# Patient Record
Sex: Female | Born: 1980
Health system: Southern US, Community
[De-identification: ages and names within clinical notes are randomized; demographics above are authoritative.]

## PROBLEM LIST (undated history)

## (undated) DIAGNOSIS — L409 Psoriasis, unspecified: Secondary | ICD-10-CM

## (undated) DIAGNOSIS — N920 Excessive and frequent menstruation with regular cycle: Secondary | ICD-10-CM

## (undated) DIAGNOSIS — E785 Hyperlipidemia, unspecified: Secondary | ICD-10-CM

## (undated) DIAGNOSIS — K219 Gastro-esophageal reflux disease without esophagitis: Secondary | ICD-10-CM

## (undated) DIAGNOSIS — G4733 Obstructive sleep apnea (adult) (pediatric): Secondary | ICD-10-CM

## (undated) DIAGNOSIS — Z3009 Encounter for other general counseling and advice on contraception: Secondary | ICD-10-CM

## (undated) DIAGNOSIS — F32A Depression, unspecified: Secondary | ICD-10-CM

## (undated) DIAGNOSIS — M810 Age-related osteoporosis without current pathological fracture: Secondary | ICD-10-CM

## (undated) DIAGNOSIS — E7800 Pure hypercholesterolemia, unspecified: Secondary | ICD-10-CM

## (undated) DIAGNOSIS — G4730 Sleep apnea, unspecified: Secondary | ICD-10-CM

## (undated) DIAGNOSIS — I10 Essential (primary) hypertension: Secondary | ICD-10-CM

## (undated) DIAGNOSIS — G43909 Migraine, unspecified, not intractable, without status migrainosus: Secondary | ICD-10-CM

## (undated) DIAGNOSIS — M1990 Unspecified osteoarthritis, unspecified site: Secondary | ICD-10-CM

## (undated) DIAGNOSIS — R350 Frequency of micturition: Secondary | ICD-10-CM

## (undated) DIAGNOSIS — Z9989 Dependence on other enabling machines and devices: Secondary | ICD-10-CM

## (undated) DIAGNOSIS — N939 Abnormal uterine and vaginal bleeding, unspecified: Secondary | ICD-10-CM

## (undated) DIAGNOSIS — L4050 Arthropathic psoriasis, unspecified: Secondary | ICD-10-CM

## (undated) DIAGNOSIS — D509 Iron deficiency anemia, unspecified: Secondary | ICD-10-CM

## (undated) DIAGNOSIS — Z789 Other specified health status: Secondary | ICD-10-CM

## (undated) DIAGNOSIS — F419 Anxiety disorder, unspecified: Secondary | ICD-10-CM

## (undated) DIAGNOSIS — Z87898 Personal history of other specified conditions: Secondary | ICD-10-CM

## (undated) HISTORY — DX: Encounter for other general counseling and advice on contraception: Z30.09

## (undated) HISTORY — PX: NO PAST SURGERIES: SHX2092

## (undated) HISTORY — DX: Psoriasis, unspecified: L40.9

## (undated) HISTORY — PX: ABDOMINAL HYSTERECTOMY: SHX81

## (undated) HISTORY — DX: Age-related osteoporosis without current pathological fracture: M81.0

## (undated) HISTORY — DX: Gastro-esophageal reflux disease without esophagitis: K21.9

## (undated) HISTORY — DX: Sleep apnea, unspecified: G47.30

---
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
## 2008-03-21 ENCOUNTER — Emergency Department (HOSPITAL_COMMUNITY): Admission: EM | Admit: 2008-03-21 | Discharge: 2008-03-21 | Payer: Self-pay | Admitting: Emergency Medicine

## 2008-03-25 ENCOUNTER — Emergency Department (HOSPITAL_COMMUNITY): Admission: EM | Admit: 2008-03-25 | Discharge: 2008-03-25 | Payer: Self-pay | Admitting: Emergency Medicine

## 2008-03-31 ENCOUNTER — Emergency Department (HOSPITAL_COMMUNITY): Admission: EM | Admit: 2008-03-31 | Discharge: 2008-03-31 | Payer: Self-pay | Admitting: Emergency Medicine

## 2011-04-10 LAB — CBC
HCT: 39
Hemoglobin: 13.6
RBC: 4.31
WBC: 10.7 — ABNORMAL HIGH

## 2011-04-10 LAB — DIFFERENTIAL
Eosinophils Relative: 3
Lymphocytes Relative: 23
Lymphs Abs: 2.4
Monocytes Absolute: 1.3 — ABNORMAL HIGH
Monocytes Relative: 12

## 2011-04-10 LAB — URINE CULTURE: Culture: NO GROWTH

## 2011-04-10 LAB — URINALYSIS, ROUTINE W REFLEX MICROSCOPIC
Ketones, ur: NEGATIVE
Leukocytes, UA: NEGATIVE
Nitrite: POSITIVE — AB
pH: 5

## 2011-04-10 LAB — BASIC METABOLIC PANEL
Chloride: 105
GFR calc non Af Amer: >60
Potassium: 3.9
Sodium: 134 — ABNORMAL LOW

## 2011-04-10 LAB — URINE MICROSCOPIC-ADD ON

## 2011-04-10 LAB — PREGNANCY, URINE: Preg Test, Ur: NEGATIVE

## 2011-04-12 LAB — URINALYSIS, ROUTINE W REFLEX MICROSCOPIC
Glucose, UA: NEGATIVE
Specific Gravity, Urine: 1.02
Urobilinogen, UA: 0.2
pH: 6

## 2011-04-12 LAB — URINE MICROSCOPIC-ADD ON

## 2012-07-09 ENCOUNTER — Emergency Department (HOSPITAL_COMMUNITY)
Admission: EM | Admit: 2012-07-09 | Discharge: 2012-07-10 | Disposition: A | Discharge status: Home / Self Care | Payer: Self-pay | Attending: Emergency Medicine | Admitting: Emergency Medicine

## 2012-07-09 ENCOUNTER — Encounter (HOSPITAL_COMMUNITY): Payer: Self-pay | Admitting: *Deleted

## 2012-07-09 DIAGNOSIS — O09299 Supervision of pregnancy with other poor reproductive or obstetric history, unspecified trimester: Secondary | ICD-10-CM | POA: Insufficient documentation

## 2012-07-09 DIAGNOSIS — O219 Vomiting of pregnancy, unspecified: Secondary | ICD-10-CM | POA: Insufficient documentation

## 2012-07-09 DIAGNOSIS — F17200 Nicotine dependence, unspecified, uncomplicated: Secondary | ICD-10-CM | POA: Insufficient documentation

## 2012-07-09 DIAGNOSIS — O4690 Antepartum hemorrhage, unspecified, unspecified trimester: Secondary | ICD-10-CM

## 2012-07-09 DIAGNOSIS — O26859 Spotting complicating pregnancy, unspecified trimester: Secondary | ICD-10-CM | POA: Insufficient documentation

## 2012-07-09 LAB — COMPREHENSIVE METABOLIC PANEL
ALT: 18 U/L (ref 0–35)
Alkaline Phosphatase: 73 U/L (ref 39–117)
CO2: 23 mEq/L (ref 19–32)
Chloride: 101 mEq/L (ref 96–112)
GFR calc Af Amer: >90 mL/min (ref >90)
GFR calc non Af Amer: >90 mL/min (ref >90)
Glucose, Bld: 118 mg/dL — ABNORMAL HIGH (ref 70–99)
Potassium: 3.4 mEq/L — ABNORMAL LOW (ref 3.5–5.1)
Sodium: 133 mEq/L — ABNORMAL LOW (ref 135–145)

## 2012-07-09 LAB — CBC WITH DIFFERENTIAL/PLATELET
Eosinophils Relative: 1 % (ref 0–5)
Lymphocytes Relative: 19 % (ref 12–46)
Lymphs Abs: 3 10*3/uL (ref 0.7–4.0)
MCV: 85.6 fL (ref 78.0–100.0)
Neutrophils Relative %: 70 % (ref 43–77)
Platelets: 343 10*3/uL (ref 150–400)
RBC: 4.5 MIL/uL (ref 3.87–5.11)
WBC: 15.6 10*3/uL — ABNORMAL HIGH (ref 4.0–10.5)

## 2012-07-09 MED ORDER — ONDANSETRON 4 MG PO TBDP: 4.0000 mg | ORAL_TABLET | Freq: Three times a day (TID) | ORAL | Status: DC | PRN

## 2012-07-09 MED ORDER — ONDANSETRON 4 MG PO TBDP
4.0000 mg | ORAL_TABLET | Freq: Once | ORAL | Status: AC
  Administered 2012-07-09: 4 mg via ORAL
  Filled 2012-07-09: qty 1

## 2012-07-09 MED ORDER — RHO D IMMUNE GLOBULIN 1500 UNIT/2ML IJ SOLN
300.0000 ug | Freq: Once | INTRAMUSCULAR | Status: AC
  Administered 2012-07-10: 300 ug via INTRAVENOUS

## 2012-07-09 NOTE — ED Notes (Signed)
Pt reports thinking she is 8-[redacted] weeks pregnant.  But hasn't followed up with a physician, so isn't sure.  States that she began spotting a little a week ago.  States that at this time, she is having light brown discharge.

## 2012-07-09 NOTE — ED Notes (Signed)
Pt [redacted]wks pregnant, having spotting off and on for 1 wk, having daily nausea and emesis, pt is not following up with prenatal visits or taking prenatal vitamins

## 2012-07-09 NOTE — ED Provider Notes (Signed)
History  This chart was scribed for Theresa Muskrat, MD by Jenne Campus, ED Scribe. This patient was seen in room APA01/APA01 and the patient's care was started at 8:57 PM.  CSN: [id number]  Arrival date & time 07/09/12  2042   First MD Initiated Contact with Patient 07/09/12 2057      Chief Complaint  Patient presents with  . Vaginal Bleeding    spotting, [redacted] wks pregnant  . Emesis During Pregnancy    The history is provided by the patient. No language interpreter was used.   Theresa Sawyer is a 31 y.o. female, currently [redacted] weeks pregnant, who presents to the Emergency Department complaining of one week of intermittent vaginal bleeding described as spotting with associated mild abdominal pain, nausea and emesis up to 7 episodes daily for the past 1.5 weeks. She denies having vaginal bleeding currently. She is not taking her prenatal vitamins and she is not following up with her prenatal visits. She is G2P0A1 and reports that her first pregnancy ended in a miscarriage after she had similar symptoms. She also reports that she is having chills and congestion but denies diarrhea, CP and SOB as associated symptoms. She does not have a h/o chronic medical conditions and denies having a h/o abdominal surgeries. She is a current everyday smoker but denies alcohol use.   She is RH negative.  History reviewed. No pertinent past medical history.  History reviewed. No pertinent past surgical history.  History reviewed. No pertinent family history.  History  Substance Use Topics  . Smoking status: Current Every Day Smoker -- 0.5 packs/day    Types: Cigarettes  . Smokeless tobacco: Not on file  . Alcohol Use: No    OB History    Grav Para Term Preterm Abortions TAB SAB Ect Mult Living   2 0   1           Review of Systems  Constitutional:       Per HPI, otherwise negative  HENT:       Per HPI, otherwise negative  Eyes: Negative.   Respiratory:       Per HPI, otherwise negative    Cardiovascular:       Per HPI, otherwise negative  Gastrointestinal: Positive for nausea, vomiting and abdominal pain. Negative for diarrhea.  Genitourinary: Positive for vaginal bleeding. Negative for vaginal pain.  Musculoskeletal:       Per HPI, otherwise negative  Skin: Negative.   Neurological: Negative for syncope.    Allergies  Review of patient's allergies indicates no known allergies.  Home Medications  No current outpatient prescriptions on file.  Triage Vitals: BP 131/84  Pulse 102  Temp 98 F (36.7 C) (Oral)  Resp 18  Ht 5' 5"  (1.651 m)  Wt 200 lb (90.719 kg)  BMI 33.28 kg/m2  SpO2 100%  Physical Exam  Nursing note and vitals reviewed. Constitutional: She is oriented to person, place, and time. She appears well-developed and well-nourished. No distress.  HENT:  Head: Normocephalic and atraumatic.  Eyes: Conjunctivae normal and EOM are normal.  Cardiovascular: Normal rate and regular rhythm.   Pulmonary/Chest: Effort normal and breath sounds normal. No stridor. No respiratory distress.  Abdominal: Soft. Bowel sounds are normal. She exhibits no distension. There is no tenderness.  Musculoskeletal: She exhibits no edema.  Neurological: She is alert and oriented to person, place, and time. No cranial nerve deficit.  Skin: Skin is warm and dry.  Psychiatric: She has a normal mood and  affect.    ED Course  [place] bedside Date/Time: 07/09/2012 8:00 PM Performed by: Theresa Sawyer Authorized by: Theresa Sawyer Consent: Verbal consent obtained. The procedure was performed in an emergent situation. Risks and benefits: risks, benefits and alternatives were discussed Consent given by: patient Patient understanding: patient states understanding of the procedure being performed Patient consent: the patient's understanding of the procedure matches consent given Patient identity confirmed: verbally with patient Time out: Immediately prior to procedure a "time out"  was called to verify the correct patient, procedure, equipment, support staff and site/side marked as required. Preparation: Patient was prepped and draped in the usual sterile fashion. Local anesthesia used: no Patient sedated: no Patient tolerance: Patient tolerated the procedure well with no immediate complications. Comments: IUP w FHT ~160 - good fetal motion, no gross intraperitoneal fluid   (including critical care time)  DIAGNOSTIC STUDIES: Oxygen Saturation is 100% on room air, normal by my interpretation.    COORDINATION OF CARE: 9:11 PM- Discussed treatment plan which includes [place] with pt at bedside and pt agreed to plan.  9:13 PM- Through [place], embryo is visualized in the correct place and moving appropriately.   10:38 PM- Advised pt of lab work results. Discussed discharge plan which includes antiemtic with pt and pt agreed to plan. Also advised pt to follow up with OB-GYN and pt agreed.  10:45 PM-Ordered 300 mcg rho(d) immune globulin injection  Labs Reviewed  CBC WITH DIFFERENTIAL - Abnormal; Notable for the following:    WBC 15.6 (*)     Neutro Abs 10.8 (*)     Monocytes Absolute 1.6 (*)     All other components within normal limits  COMPREHENSIVE METABOLIC PANEL - Abnormal; Notable for the following:    Sodium 133 (*)     Potassium 3.4 (*)     Glucose, Bld 118 (*)     All other components within normal limits  RH IG WORKUP (INCLUDES ABO/RH)  LIPASE, BLOOD   No results found.   No diagnosis found.    MDM   I personally performed the services described in this documentation, which was scribed in my presence. The recorded information has been reviewed and is accurate.   This patient, early in pregnancy presents with concerns of vaginal spotting, nausea, vomiting, abdominal discomfort.  On exam vital signs are stable and she is in no distress.  The patient one prior pregnancy, spontaneously terminated.  But the ultrasound demonstrates an IUP appropriate heart  tones.  The patient's labs are unremarkable, with road negative status.  The patient was provided appropriate immunization, discharged in stable condition after discussion on the need for close outpatient monitoring and management of her pregnancy.   Theresa Muskrat, MD 07/09/12 [phone]

## 2012-07-10 NOTE — L&D Delivery Note (Signed)
Delivery Note At 4:45 PM a viable female was delivered via Vaginal, Spontaneous Delivery (Presentation: ; left Occiput Anterior).  APGAR: 8, 9; weight TBD .   Placenta status: Intact, Spontaneous.  Cord: 3 vessel  with the following complications: see below .    Anesthesia: Epidural  Episiotomy: None Lacerations: 2nd degree;Perineal Suture Repair: 3.0 vicryl rapide Est. Blood Loss (mL): 400  Mom to postpartum.  Baby to nursery-stable.  Bradden Tadros L 02/04/2013, 5:28 PM

## 2012-07-10 NOTE — L&D Delivery Note (Signed)
Attestation of Attending Supervision of Advanced Practitioner (CNM/NP): Evaluation and management procedures were performed by the Advanced Practitioner under my supervision and collaboration.  I have reviewed the Advanced Practitioner's note and chart, and I agree with the management and plan.  Sophya Vanblarcom 02/10/2013 1:21 PM

## 2012-07-11 LAB — RH IG WORKUP (INCLUDES ABO/RH)
Gestational Age(Wks): 8
Unit division: 0

## 2012-07-14 ENCOUNTER — Emergency Department (HOSPITAL_COMMUNITY)
Admission: EM | Admit: 2012-07-14 | Discharge: 2012-07-14 | Disposition: A | Discharge status: Home / Self Care | Payer: Self-pay | Attending: Emergency Medicine | Admitting: Emergency Medicine

## 2012-07-14 ENCOUNTER — Encounter (HOSPITAL_COMMUNITY): Payer: Self-pay | Admitting: *Deleted

## 2012-07-14 DIAGNOSIS — F17200 Nicotine dependence, unspecified, uncomplicated: Secondary | ICD-10-CM | POA: Insufficient documentation

## 2012-07-14 DIAGNOSIS — R05 Cough: Secondary | ICD-10-CM | POA: Insufficient documentation

## 2012-07-14 DIAGNOSIS — R112 Nausea with vomiting, unspecified: Secondary | ICD-10-CM | POA: Insufficient documentation

## 2012-07-14 DIAGNOSIS — R059 Cough, unspecified: Secondary | ICD-10-CM | POA: Insufficient documentation

## 2012-07-14 DIAGNOSIS — O9989 Other specified diseases and conditions complicating pregnancy, childbirth and the puerperium: Secondary | ICD-10-CM | POA: Insufficient documentation

## 2012-07-14 DIAGNOSIS — J1000 Influenza due to other identified influenza virus with unspecified type of pneumonia: Secondary | ICD-10-CM | POA: Insufficient documentation

## 2012-07-14 DIAGNOSIS — H9209 Otalgia, unspecified ear: Secondary | ICD-10-CM | POA: Insufficient documentation

## 2012-07-14 DIAGNOSIS — J101 Influenza due to other identified influenza virus with other respiratory manifestations: Secondary | ICD-10-CM

## 2012-07-14 LAB — COMPREHENSIVE METABOLIC PANEL
ALT: 21 U/L (ref 0–35)
Calcium: 9.5 mg/dL (ref 8.4–10.5)
Creatinine, Ser: 0.61 mg/dL (ref 0.50–1.10)
GFR calc Af Amer: >90 mL/min (ref >90)
Glucose, Bld: 101 mg/dL — ABNORMAL HIGH (ref 70–99)
Sodium: 132 mEq/L — ABNORMAL LOW (ref 135–145)
Total Protein: 7.8 g/dL (ref 6.0–8.3)

## 2012-07-14 LAB — CBC WITH DIFFERENTIAL/PLATELET
Basophils Absolute: 0 10*3/uL (ref 0.0–0.1)
Eosinophils Absolute: 0 10*3/uL (ref 0.0–0.7)
Eosinophils Relative: 0 % (ref 0–5)
Lymphs Abs: 1.3 10*3/uL (ref 0.7–4.0)
MCH: 30 pg (ref 26.0–34.0)
MCV: 84.6 fL (ref 78.0–100.0)
Monocytes Absolute: 1.2 10*3/uL — ABNORMAL HIGH (ref 0.1–1.0)
Platelets: 247 10*3/uL (ref 150–400)
RDW: 13.1 % (ref 11.5–15.5)

## 2012-07-14 LAB — URINALYSIS, ROUTINE W REFLEX MICROSCOPIC
Ketones, ur: >80 mg/dL — AB
Leukocytes, UA: NEGATIVE
Nitrite: NEGATIVE
Protein, ur: 30 mg/dL — AB
Urobilinogen, UA: 4 mg/dL — ABNORMAL HIGH (ref 0.0–1.0)
pH: 6 (ref 5.0–8.0)

## 2012-07-14 LAB — INFLUENZA PANEL BY PCR (TYPE A & B)
H1N1 flu by pcr: DETECTED — AB
Influenza B By PCR: NEGATIVE

## 2012-07-14 MED ORDER — OSELTAMIVIR PHOSPHATE 75 MG PO CAPS
75.0000 mg | ORAL_CAPSULE | Freq: Once | ORAL | Status: AC
  Administered 2012-07-14: 75 mg via ORAL
  Filled 2012-07-14: qty 1

## 2012-07-14 MED ORDER — OSELTAMIVIR PHOSPHATE 75 MG PO CAPS: 75.0000 mg | ORAL_CAPSULE | Freq: Two times a day (BID) | ORAL | Status: DC

## 2012-07-14 MED ORDER — ONDANSETRON 8 MG/NS 50 ML IVPB
8.0000 mg | Freq: Once | INTRAVENOUS | Status: DC
  Filled 2012-07-14: qty 8

## 2012-07-14 MED ORDER — ONDANSETRON HCL 4 MG PO TABS: 4.0000 mg | ORAL_TABLET | Freq: Four times a day (QID) | ORAL | Status: DC

## 2012-07-14 MED ORDER — ONDANSETRON HCL 4 MG/2ML IJ SOLN
INTRAMUSCULAR | Status: AC
  Administered 2012-07-14: 8 mg via INTRAVENOUS
  Filled 2012-07-14: qty 4

## 2012-07-14 MED ORDER — SODIUM CHLORIDE 0.9 % IV BOLUS (SEPSIS)
1000.0000 mL | Freq: Once | INTRAVENOUS | Status: AC
  Administered 2012-07-14: 1000 mL via INTRAVENOUS

## 2012-07-14 NOTE — ED Provider Notes (Signed)
History     CSN: [id number]  Arrival date & time 07/14/12  1736   First MD Initiated Contact with Patient 07/14/12 1743      Chief Complaint  Patient presents with  . Emesis During Pregnancy    (Consider location/radiation/quality/duration/timing/severity/associated sxs/prior treatment) HPI Comments: Comes to the ER for evaluation of nausea and vomiting. Patient reports that she is [redacted] weeks pregnant. She has been having severe nausea and vomiting for the last 4 days. Patient says she cannot have anything down. She also has had left earache she had cough. The symptoms have been present for a couple of days as well. Patient has not had any diarrhea. She has not taken her temperature.   History reviewed. No pertinent past medical history.  History reviewed. No pertinent past surgical history.  No family history on file.  History  Substance Use Topics  . Smoking status: Current Every Day Smoker -- 0.5 packs/day    Types: Cigarettes  . Smokeless tobacco: Not on file  . Alcohol Use: No    OB History    Grav Para Term Preterm Abortions TAB SAB Ect Mult Living   1               Review of Systems  HENT: Positive for ear pain.   Respiratory: Positive for cough.   Gastrointestinal: Positive for nausea and vomiting.  All other systems reviewed and are negative.    Allergies  Review of patient's allergies indicates no known allergies.  Home Medications   Current Outpatient Rx  Name  Route  Sig  Dispense  Refill  . ONDANSETRON 4 MG PO TBDP   Oral   Take 1 tablet (4 mg total) by mouth every 8 (eight) hours as needed for nausea.   20 tablet   0     BP 124/109  Pulse 124  Temp 98.1 F (36.7 C) (Oral)  Resp 20  Ht 5' 5"  (1.651 m)  Wt 200 lb (90.719 kg)  BMI 33.28 kg/m2  SpO2 98%  Physical Exam  Constitutional: She is oriented to person, place, and time. She appears well-developed and well-nourished. No distress.  HENT:  Head: Normocephalic and atraumatic.    Right Ear: Hearing, tympanic membrane, external ear and ear canal normal.  Left Ear: Tympanic membrane, external ear and ear canal normal.  Nose: Nose normal.  Mouth/Throat: Oropharynx is clear and moist and mucous membranes are normal.  Eyes: Conjunctivae normal and EOM are normal. Pupils are equal, round, and reactive to light.  Neck: Normal range of motion. Neck supple.  Cardiovascular: Normal rate, regular rhythm, S1 normal and S2 normal.  Exam reveals no gallop and no friction rub.   No murmur heard. Pulmonary/Chest: Effort normal and breath sounds normal. No respiratory distress. She exhibits no tenderness.  Abdominal: Soft. Normal appearance and bowel sounds are normal. There is no hepatosplenomegaly. There is no tenderness. There is no rebound, no guarding, no tenderness at McBurney's point and negative Murphy's sign. No hernia.  Musculoskeletal: Normal range of motion.  Neurological: She is alert and oriented to person, place, and time. She has normal strength. No cranial nerve deficit or sensory deficit. Coordination normal. GCS eye subscore is 4. GCS verbal subscore is 5. GCS motor subscore is 6.  Skin: Skin is warm, dry and intact. No rash noted. No cyanosis.  Psychiatric: She has a normal mood and affect. Her speech is normal and behavior is normal. Thought content normal.    ED Course  Procedures (  including critical care time)  Labs Reviewed  CBC WITH DIFFERENTIAL - Abnormal; Notable for the following:    Monocytes Relative 15 (*)     Monocytes Absolute 1.2 (*)     All other components within normal limits  COMPREHENSIVE METABOLIC PANEL - Abnormal; Notable for the following:    Sodium 132 (*)     Potassium 3.2 (*)     Glucose, Bld 101 (*)     All other components within normal limits  INFLUENZA PANEL BY PCR - Abnormal; Notable for the following:    Influenza A By PCR POSITIVE (*)     H1N1 flu by pcr DETECTED (*)     All other components within normal limits   URINALYSIS, ROUTINE W REFLEX MICROSCOPIC - Abnormal; Notable for the following:    Specific Gravity, Urine >1.030 (*)     Bilirubin Urine MODERATE (*)     Ketones, ur >80 (*)     Protein, ur 30 (*)     Urobilinogen, UA 4.0 (*)     All other components within normal limits  URINE MICROSCOPIC-ADD ON   No results found.   1. Influenza A (H1N1)       MDM  Patient presents to ER for evaluation of nausea and vomiting. Patient is approximately [redacted] weeks pregnant. Records reveals that she a previous visit with ultrasound showed intrauterine pregnancy. It was felt that the vomiting was likely secondary to early pregnancy, however she also reports having some cough and chest congestion. And influenza PCR panel is therefore sent and was positive for H1 N1 influenza A. Patient initiated on Tamiflu.        Orpah Greek, MD 07/14/12 2017

## 2012-07-14 NOTE — ED Notes (Signed)
Seen here on 07/09/12 for same - reports is not any better.  Pt approx [redacted] wks pregnant with vomiting and dizziness and near syncope.

## 2012-07-14 NOTE — ED Notes (Signed)
Up to bathroom, tolerated well. Asking for something to drink, cola given per pt request

## 2012-07-14 NOTE — ED Notes (Signed)
Pt unable to void at this time. 

## 2012-07-14 NOTE — ED Notes (Signed)
Has tolerated full can of cola and several packs of saltines. No nausea

## 2012-12-04 ENCOUNTER — Other Ambulatory Visit: Payer: Self-pay | Admitting: Obstetrics & Gynecology

## 2012-12-04 DIAGNOSIS — O0933 Supervision of pregnancy with insufficient antenatal care, third trimester: Secondary | ICD-10-CM

## 2012-12-10 ENCOUNTER — Other Ambulatory Visit: Payer: Self-pay | Admitting: Obstetrics & Gynecology

## 2012-12-10 ENCOUNTER — Ambulatory Visit (INDEPENDENT_AMBULATORY_CARE_PROVIDER_SITE_OTHER): Payer: Medicaid Other

## 2012-12-10 DIAGNOSIS — O0930 Supervision of pregnancy with insufficient antenatal care, unspecified trimester: Secondary | ICD-10-CM

## 2012-12-10 DIAGNOSIS — O0933 Supervision of pregnancy with insufficient antenatal care, third trimester: Secondary | ICD-10-CM

## 2012-12-10 DIAGNOSIS — O09299 Supervision of pregnancy with other poor reproductive or obstetric history, unspecified trimester: Secondary | ICD-10-CM

## 2012-12-10 NOTE — Progress Notes (Signed)
U/s performed ,anat screen complete late gest. Age. Meas. C/w 32 wks gest. edc 02-03-13, female fetus, vertex lie, ant. Plac, gr 2,

## 2012-12-16 ENCOUNTER — Ambulatory Visit (INDEPENDENT_AMBULATORY_CARE_PROVIDER_SITE_OTHER): Payer: Medicaid Other | Admitting: Women's Health

## 2012-12-16 ENCOUNTER — Other Ambulatory Visit: Payer: Medicaid Other

## 2012-12-16 ENCOUNTER — Other Ambulatory Visit (HOSPITAL_COMMUNITY)
Admission: RE | Admit: 2012-12-16 | Discharge: 2012-12-16 | Disposition: A | Discharge status: Home / Self Care | Payer: Medicaid Other | Source: Ambulatory Visit | Attending: Obstetrics and Gynecology | Admitting: Obstetrics and Gynecology

## 2012-12-16 ENCOUNTER — Encounter: Payer: Self-pay | Admitting: Women's Health

## 2012-12-16 VITALS — BP 124/80 | Wt 192.5 lb

## 2012-12-16 DIAGNOSIS — Z01419 Encounter for gynecological examination (general) (routine) without abnormal findings: Secondary | ICD-10-CM | POA: Insufficient documentation

## 2012-12-16 DIAGNOSIS — O99330 Smoking (tobacco) complicating pregnancy, unspecified trimester: Secondary | ICD-10-CM

## 2012-12-16 DIAGNOSIS — Z331 Pregnant state, incidental: Secondary | ICD-10-CM

## 2012-12-16 DIAGNOSIS — O0993 Supervision of high risk pregnancy, unspecified, third trimester: Secondary | ICD-10-CM

## 2012-12-16 DIAGNOSIS — Z3480 Encounter for supervision of other normal pregnancy, unspecified trimester: Secondary | ICD-10-CM | POA: Insufficient documentation

## 2012-12-16 DIAGNOSIS — Z113 Encounter for screening for infections with a predominantly sexual mode of transmission: Secondary | ICD-10-CM | POA: Insufficient documentation

## 2012-12-16 DIAGNOSIS — O09899 Supervision of other high risk pregnancies, unspecified trimester: Secondary | ICD-10-CM

## 2012-12-16 DIAGNOSIS — O0933 Supervision of pregnancy with insufficient antenatal care, third trimester: Secondary | ICD-10-CM | POA: Insufficient documentation

## 2012-12-16 DIAGNOSIS — Z1389 Encounter for screening for other disorder: Secondary | ICD-10-CM

## 2012-12-16 DIAGNOSIS — O0930 Supervision of pregnancy with insufficient antenatal care, unspecified trimester: Secondary | ICD-10-CM

## 2012-12-16 DIAGNOSIS — O09299 Supervision of pregnancy with other poor reproductive or obstetric history, unspecified trimester: Secondary | ICD-10-CM

## 2012-12-16 DIAGNOSIS — Z3483 Encounter for supervision of other normal pregnancy, third trimester: Secondary | ICD-10-CM

## 2012-12-16 DIAGNOSIS — Z1151 Encounter for screening for human papillomavirus (HPV): Secondary | ICD-10-CM | POA: Insufficient documentation

## 2012-12-16 LAB — POCT URINALYSIS DIPSTICK
Blood, UA: NEGATIVE
Glucose, UA: NEGATIVE
Nitrite, UA: NEGATIVE

## 2012-12-16 LAB — URINALYSIS
Bilirubin Urine: NEGATIVE
Ketones, ur: NEGATIVE mg/dL
Nitrite: NEGATIVE
Protein, ur: NEGATIVE mg/dL
Specific Gravity, Urine: 1.017 (ref 1.005–1.030)
Urobilinogen, UA: 0.2 mg/dL (ref 0.0–1.0)

## 2012-12-16 NOTE — Addendum Note (Signed)
Addended by: Linton Rump on: 12/16/2012 10:09 AM   Modules accepted: Orders

## 2012-12-16 NOTE — Patient Instructions (Addendum)
You will have your sugar test next visit.  Please do not eat or drink anything after midnight the night before you come, not even water.  You will be here for at least two hours.    Pregnancy - Third Trimester The third trimester of pregnancy (the last 3 months) is a period of the most rapid growth for you and your baby. The baby approaches a length of 20 inches and a weight of 6 to 10 pounds. The baby is adding on fat and getting ready for life outside your body. While inside, babies have periods of sleeping and waking, sucking thumbs, and hiccuping. You can often feel small contractions of the uterus. This is false labor. It is also called Braxton-Hicks contractions. This is like a practice for labor. The usual problems in this stage of pregnancy include more difficulty breathing, swelling of the hands and feet from water retention, and having to urinate more often because of the uterus and baby pressing on your bladder.  PRENATAL EXAMS  Blood work may continue to be done during prenatal exams. These tests are done to check on your health and the probable health of your baby. Blood work is used to follow your blood levels (hemoglobin). Anemia (low hemoglobin) is common during pregnancy. Iron and vitamins are given to help prevent this. You may also continue to be checked for diabetes. Some of the past blood tests may be done again.  The size of the uterus is measured during each visit. This makes sure your baby is growing properly according to your pregnancy dates.  Your blood pressure is checked every prenatal visit. This is to make sure you are not getting toxemia.  Your urine is checked every prenatal visit for infection, diabetes, and protein.  Your weight is checked at each visit. This is done to make sure gains are happening at the suggested rate and that you and your baby are growing normally.  Sometimes, an ultrasound is performed to confirm the position and the proper growth and  development of the baby. This is a test done that bounces harmless sound waves off the baby so your caregiver can more accurately determine a due date.  Discuss the type of pain medicine and anesthesia you will have during your labor and delivery.  Discuss the possibility and anesthesia if a cesarean section might be necessary.  Inform your caregiver if there is any mental or physical violence at home. Sometimes, a specialized non-stress test, contraction stress test, and biophysical profile are done to make sure the baby is not having a problem. Checking the amniotic fluid surrounding the baby is called an amniocentesis. The amniotic fluid is removed by sticking a needle into the belly (abdomen). This is sometimes done near the end of pregnancy if an early delivery is required. In this case, it is done to help make sure the baby's lungs are mature enough for the baby to live outside of the womb. If the lungs are not mature and it is unsafe to deliver the baby, an injection of cortisone medicine is given to the mother 1 to 2 days before the delivery. This helps the baby's lungs mature and makes it safer to deliver the baby. CHANGES OCCURING IN THE THIRD TRIMESTER OF PREGNANCY Your body goes through many changes during pregnancy. They vary from person to person. Talk to your caregiver about changes you notice and are concerned about.  During the last trimester, you have probably had an increase in your appetite. It  is normal to have cravings for certain foods. This varies from person to person and pregnancy to pregnancy.  You may begin to get stretch marks on your hips, abdomen, and breasts. These are normal changes in the body during pregnancy. There are no exercises or medicines to take which prevent this change.  Constipation may be treated with a stool softener or adding bulk to your diet. Drinking lots of fluids, fiber in vegetables, fruits, and whole grains are helpful.  Exercising is also  helpful. If you have been very active up until your pregnancy, most of these activities can be continued during your pregnancy. If you have been less active, it is helpful to start an exercise program such as walking. Consult your caregiver before starting exercise programs.  Avoid all smoking, alcohol, non-prescribed drugs, herbs and "street drugs" during your pregnancy. These chemicals affect the formation and growth of the baby. Avoid chemicals throughout the pregnancy to ensure the delivery of a healthy infant.  Backache, varicose veins, and hemorrhoids may develop or get worse.  You will tire more easily in the third trimester, which is normal.  The baby's movements may be stronger and more often.  You may become short of breath easily.  Your belly button may stick out.  A yellow discharge may leak from your breasts called colostrum.  You may have a bloody mucus discharge. This usually occurs a few days to a week before labor begins. HOME CARE INSTRUCTIONS   Keep your caregiver's appointments. Follow your caregiver's instructions regarding medicine use, exercise, and diet.  During pregnancy, you are providing food for you and your baby. Continue to eat regular, well-balanced meals. Choose foods such as meat, fish, milk and other low fat dairy products, vegetables, fruits, and whole-grain breads and cereals. Your caregiver will tell you of the ideal weight gain.  A physical sexual relationship may be continued throughout pregnancy if there are no other problems such as early (premature) leaking of amniotic fluid from the membranes, vaginal bleeding, or belly (abdominal) pain.  Exercise regularly if there are no restrictions. Check with your caregiver if you are unsure of the safety of your exercises. Greater weight gain will occur in the last 2 trimesters of pregnancy. Exercising helps:  Control your weight.  Get you in shape for labor and delivery.  You lose weight after you  deliver.  Rest a lot with legs elevated, or as needed for leg cramps or low back pain.  Wear a good support or jogging bra for breast tenderness during pregnancy. This may help if worn during sleep. Pads or tissues may be used in the bra if you are leaking colostrum.  Do not use hot tubs, steam rooms, or saunas.  Wear your seat belt when driving. This protects you and your baby if you are in an accident.  Avoid raw meat, cat litter boxes and soil used by cats. These carry germs that can cause birth defects in the baby.  It is easier to leak urine during pregnancy. Tightening up and strengthening the pelvic muscles will help with this problem. You can practice stopping your urination while you are going to the bathroom. These are the same muscles you need to strengthen. It is also the muscles you would use if you were trying to stop from passing gas. You can practice tightening these muscles up 10 times a set and repeating this about 3 times per day. Once you know what muscles to tighten up, do not perform these exercises  during urination. It is more likely to cause an infection by backing up the urine.  Ask for help if you have financial, counseling, or nutritional needs during pregnancy. Your caregiver will be able to offer counseling for these needs as well as refer you for other special needs.  Make a list of emergency phone numbers and have them available.  Plan on getting help from family or friends when you go home from the hospital.  Make a trial run to the hospital.  Take prenatal classes with the father to understand, practice, and ask questions about the labor and delivery.  Prepare the baby's room or nursery.  Do not travel out of the city unless it is absolutely necessary and with the advice of your caregiver.  Wear only low or no heal shoes to have better balance and prevent falling. MEDICINES AND DRUG USE IN PREGNANCY  Take prenatal vitamins as directed. The vitamin  should contain 1 milligram of folic acid. Keep all vitamins out of reach of children. Only a couple vitamins or tablets containing iron may be fatal to a baby or young child when ingested.  Avoid use of all medicines, including herbs, over-the-counter medicines, not prescribed or suggested by your caregiver. Only take over-the-counter or prescription medicines for pain, discomfort, or fever as directed by your caregiver. Do not use aspirin, ibuprofen or naproxen unless approved by your caregiver.  Let your caregiver also know about herbs you may be using.  Alcohol is related to a number of birth defects. This includes fetal alcohol syndrome. All alcohol, in any form, should be avoided completely. Smoking will cause low birth rate and premature babies.  Illegal drugs are very harmful to the baby. They are absolutely forbidden. A baby born to an addicted mother will be addicted at birth. The baby will go through the same withdrawal an adult does. SEEK MEDICAL CARE IF: You have any concerns or worries during your pregnancy. It is better to call with your questions if you feel they cannot wait, rather than worry about them. SEEK IMMEDIATE MEDICAL CARE IF:   An unexplained oral temperature above 102 F (38.9 C) develops, or as your caregiver suggests.  You have leaking of fluid from the vagina. If leaking membranes are suspected, take your temperature and tell your caregiver of this when you call.  There is vaginal spotting, bleeding or passing clots. Tell your caregiver of the amount and how many pads are used.  You develop a bad smelling vaginal discharge with a change in the color from clear to white.  You develop vomiting that lasts more than 24 hours.  You develop chills or fever.  You develop shortness of breath.  You develop burning on urination.  You loose more than 2 pounds of weight or gain more than 2 pounds of weight or as suggested by your caregiver.  You notice sudden  swelling of your face, hands, and feet or legs.  You develop belly (abdominal) pain. Round ligament discomfort is a common non-cancerous (benign) cause of abdominal pain in pregnancy. Your caregiver still must evaluate you.  You develop a severe headache that does not go away.  You develop visual problems, blurred or double vision.  If you have not felt your baby move for more than 1 hour. If you think the baby is not moving as much as usual, eat something with sugar in it and lie down on your left side for an hour. The baby should move at least 4 to  5 times per hour. Call right away if your baby moves less than that.  You fall, are in a car accident, or any kind of trauma.  There is mental or physical violence at home. Document Released: 06/20/2001 Document Revised: 03/20/2012 Document Reviewed: 12/23/2008 [facility] Patient Information 2014 [place].

## 2012-12-16 NOTE — Progress Notes (Signed)
New OB packet given. Consents signed.

## 2012-12-16 NOTE — Progress Notes (Addendum)
  Subjective:    Theresa Sawyer is a 32 y.o. G55P0020 Caucasian female at 52w0dby 32.1wk u/s, being seen today for her first obstetrical visit.  Her obstetrical history is significant for obesity, smoker and late onset care @ 32.1wks.  Pregnancy history fully reviewed. States she was initially contemplating EAB, had a very bad case of the flu, then had a virus after that, and then it was too late.  Smokes 1pack q 3-4 days. Thinking about breast/bottlefeeding.  Desires BTL, r/b fully discussed, pt wishes to proceed. Patient reports no bleeding, no contractions and no leaking. Reports good fm. Denies urinary frequency, hesitancy, urgency, or dysuria.  Filed Vitals:   12/16/12 0904  BP: 124/80  Weight: 192 lb 8 oz (87.317 kg)    HISTORY: OB History   Grav Para Term Preterm Abortions TAB SAB Ect Mult Living   3 0 0 0 2 1 1 0 0 0      # Outc Date GA Lbr Len/2nd Wgt Sex Del Anes PTL Lv   1 TAB         No   2 SAB         No   3 CUR              Past Medical History  Diagnosis Date  . Medical history non-contributory    Past Surgical History  Procedure Laterality Date  . No past surgeries     Family History  Problem Relation Age of Onset  . Diabetes Maternal Grandmother   . Diabetes Paternal Grandmother      Exam    Pelvic Exam:    Perineum: Normal Perineum   Vulva: normal   Vagina:  normal mucosa, normal discharge, no palpable nodules   Uterus   FH 32cm     Cervix: normal   Adnexa: Not palpable   Urinary: urethral meatus normal    System:     Skin: normal coloration and turgor, no rashes    Neurologic: oriented, normal mood   Extremities: normal strength, tone, and muscle mass   HEENT PERRLA   Mouth/Teeth mucous membranes moist   Cardiovascular: regular rate and rhythm   Respiratory:  appears well, vitals normal, no respiratory distress, acyanotic, normal RR   Abdomen: soft, non-tender    Thin prep pap smear obatined w/ high risk HPV screening  FHR: 144 via  doppler   Assessment:    Pregnancy: [id number] Active Problem List   Diagnosis Date Noted  . Supervision of other normal pregnancy 12/16/2012    Priority: High  . Late prenatal care complicating pregnancy in third trimester 12/16/2012    Priority: High      [id number]  New OB visit Smoker Pregravid BMI 35.8 Late onset care @ 32.1wks (office u/s) Desires permanent sterilization pp   Plan:   Continue prenatal vitamins Problem list reviewed and updated BTL consent signed today UA, C&S, UDS today Reviewed ptl s/s, fetal kick counts, and s/s to report Genetic Screening discussed : too late Cystic fibrosis screening discussed requested Ultrasound discussed; fetal survey: results reviewed Follow up asap (pt states earliest is Friday) for 2hr gtt- will get PN1, CF, and HSV2 at the same time, then in 2 weeks for visit  BoTawnya Crook/03/2013 9:38 AM

## 2012-12-17 LAB — DRUG SCREEN, URINE, NO CONFIRMATION
Amphetamine Screen, Ur: NEGATIVE
Barbiturate Quant, Ur: NEGATIVE
Cocaine Metabolites: NEGATIVE
Methadone: NEGATIVE
Opiate Screen, Urine: NEGATIVE

## 2012-12-18 LAB — US OB DETAIL + 14 WK

## 2012-12-18 LAB — URINE CULTURE: Organism ID, Bacteria: NO GROWTH

## 2012-12-20 ENCOUNTER — Other Ambulatory Visit: Payer: Medicaid Other

## 2012-12-20 DIAGNOSIS — Z3480 Encounter for supervision of other normal pregnancy, unspecified trimester: Secondary | ICD-10-CM

## 2012-12-20 LAB — CBC
HCT: 30.9 % — ABNORMAL LOW (ref 36.0–46.0)
Hemoglobin: 11 g/dL — ABNORMAL LOW (ref 12.0–15.0)
MCH: 30.6 pg (ref 26.0–34.0)
MCHC: 35.6 g/dL (ref 30.0–36.0)
MCV: 86.1 fL (ref 78.0–100.0)
RBC: 3.59 MIL/uL — ABNORMAL LOW (ref 3.87–5.11)

## 2012-12-21 ENCOUNTER — Encounter: Payer: Self-pay | Admitting: Women's Health

## 2012-12-21 DIAGNOSIS — O26899 Other specified pregnancy related conditions, unspecified trimester: Secondary | ICD-10-CM

## 2012-12-21 DIAGNOSIS — Z6791 Unspecified blood type, Rh negative: Secondary | ICD-10-CM | POA: Insufficient documentation

## 2012-12-21 LAB — ABO AND RH: Rh Type: NEGATIVE

## 2012-12-21 LAB — SICKLE CELL SCREEN: Sickle Cell Screen: NEGATIVE

## 2012-12-21 LAB — GLUCOSE TOLERANCE, 2 HOURS W/ 1HR: Glucose, 1 hour: 177 mg/dL — ABNORMAL HIGH (ref 70–170)

## 2012-12-21 LAB — HEPATITIS B SURFACE ANTIGEN: Hepatitis B Surface Ag: NEGATIVE

## 2012-12-23 ENCOUNTER — Encounter: Payer: Self-pay | Admitting: Women's Health

## 2012-12-24 LAB — CYSTIC FIBROSIS DIAGNOSTIC STUDY

## 2012-12-28 ENCOUNTER — Encounter: Payer: Self-pay | Admitting: Women's Health

## 2012-12-30 ENCOUNTER — Encounter: Payer: Self-pay | Admitting: Women's Health

## 2012-12-30 ENCOUNTER — Ambulatory Visit (INDEPENDENT_AMBULATORY_CARE_PROVIDER_SITE_OTHER): Payer: Medicaid Other | Admitting: Women's Health

## 2012-12-30 VITALS — BP 122/72 | Wt 194.0 lb

## 2012-12-30 DIAGNOSIS — B373 Candidiasis of vulva and vagina: Secondary | ICD-10-CM

## 2012-12-30 DIAGNOSIS — Z331 Pregnant state, incidental: Secondary | ICD-10-CM

## 2012-12-30 DIAGNOSIS — O360990 Maternal care for other rhesus isoimmunization, unspecified trimester, not applicable or unspecified: Secondary | ICD-10-CM

## 2012-12-30 DIAGNOSIS — O2390 Unspecified genitourinary tract infection in pregnancy, unspecified trimester: Secondary | ICD-10-CM

## 2012-12-30 DIAGNOSIS — O360130 Maternal care for anti-D [Rh] antibodies, third trimester, not applicable or unspecified: Secondary | ICD-10-CM

## 2012-12-30 DIAGNOSIS — Z1389 Encounter for screening for other disorder: Secondary | ICD-10-CM

## 2012-12-30 LAB — POCT URINALYSIS DIPSTICK
Glucose, UA: NEGATIVE
Leukocytes, UA: NEGATIVE
Protein, UA: NEGATIVE

## 2012-12-30 MED ORDER — FLUCONAZOLE 150 MG PO TABS: 150.0000 mg | ORAL_TABLET | Freq: Once | ORAL | Status: DC

## 2012-12-30 MED ORDER — RHO D IMMUNE GLOBULIN 1500 UNIT/2ML IJ SOLN
300.0000 ug | Freq: Once | INTRAMUSCULAR | Status: AC
  Administered 2012-12-30: 300 ug via INTRAMUSCULAR

## 2012-12-30 NOTE — Progress Notes (Signed)
Pressure in back and bladder area.

## 2012-12-30 NOTE — Patient Instructions (Addendum)
Nexium, Prilosec, or Zantac Heartburn During Pregnancy  Heartburn is a burning sensation in the chest caused by stomach acid backing up into the esophagus. Heartburn (also known as "reflux") is common in pregnancy because a certain hormone (progesterone) changes. The progesterone hormone may relax the valve that separates the esophagus from the stomach. This allows acid to go up into the esophagus, causing heartburn. Heartburn may also happen in pregnancy because the enlarging uterus pushes up on the stomach, which pushes more acid into the esophagus. This is especially true in the later stages of pregnancy. Heartburn problems usually go away after giving birth. CAUSES   The progesterone hormone.  Changing hormone levels.  The growing uterus that pushes stomach acid upward.  Large meals.  Certain foods and drinks.  Exercise.  Increased acid production. SYMPTOMS   Burning pain in the chest or lower throat.  Bitter taste in the mouth.  Coughing. DIAGNOSIS  Heartburn is typically diagnosed by your caregiver when taking a careful history of your concern. Your caregiver may order a blood test to check for a certain type of bacteria that is associated with heartburn. Sometimes, heartburn is diagnosed by prescribing a heartburn medicine to see if the symptoms improve. It is rare in pregnancy to have a procedure called an endoscopy. This is when a tube with a light and a camera on the end is used to examine the esophagus and the stomach. TREATMENT   Your caregiver may tell you to use certain over-the-counter medicines (antacids, acid reducers) for mild heartburn.  Your caregiver may prescribe medicines to decrease stomach acid or to protect your stomach lining.  Your caregiver may recommend certain diet changes.  For severe cases, your caregiver may recommend that the head of the bed be elevated on blocks. (Sleeping with more pillows is not an effective treatment as it only changes the  position of your head and does not improve the main problem of stomach acid refluxing into the esophagus.) HOME CARE INSTRUCTIONS   Take all medicines as directed by your caregiver.  Raise the head of your bed by putting blocks under the legs if instructed to by your caregiver.  Do not exercise right after eating.  Avoid eating 2 or 3 hours before bed. Do not lie down right after eating.  Eat small meals throughout the day instead of 3 large meals.  Identify foods and beverages that make your symptoms worse and avoid them. Foods you may want to avoid include:  Peppers.  Chocolate.  High-fat foods, including fried foods.  Spicy foods.  Garlic and onions.  Citrus fruits, including oranges, grapefruit, lemons, and limes.  Food containing tomatoes or tomato products.  Mint.  Carbonated and caffeinated drinks.  Vinegar. SEEK IMMEDIATE MEDICAL CARE IF:   You have severe chest pain that goes down your arm or into your jaw or neck.  You feel sweaty, dizzy, or lightheaded.  You become short of breath.  You vomit blood.  You have difficulty or pain with swallowing.  You have bloody or black, tarry stools.  You have episodes of heartburn more than 3 times a week, for more than 2 weeks. MAKE SURE YOU:  Understand these instructions.  Will watch your condition.  Will get help right away if you are not doing well or get worse. Document Released: 06/23/2000 Document Revised: 09/18/2011 Document Reviewed: 12/15/2010 [facility] Patient Information 2014 [place], [place].

## 2012-12-30 NOTE — Progress Notes (Signed)
Reports good fm. Denies uc's, lof, vb, urinary frequency, urgency, hesitancy, or dysuria.  Reports vaginal burning w/ increased white d/c x last few days. Rx Diflucan. Bad reflux, tried tums but they made her sick. Reviewed reflux relief measures, recommended OTC nexium, prilosec, or zantac. Reviewed ptl s/s, fetal kick counts.  All questions answered. F/U in 2wks for visit.

## 2013-01-13 ENCOUNTER — Ambulatory Visit (INDEPENDENT_AMBULATORY_CARE_PROVIDER_SITE_OTHER): Payer: Medicaid Other | Admitting: Obstetrics and Gynecology

## 2013-01-13 ENCOUNTER — Ambulatory Visit (INDEPENDENT_AMBULATORY_CARE_PROVIDER_SITE_OTHER): Payer: Medicaid Other | Admitting: Adult Health

## 2013-01-13 ENCOUNTER — Encounter: Payer: Self-pay | Admitting: Adult Health

## 2013-01-13 VITALS — BP 110/70 | Wt 192.0 lb

## 2013-01-13 DIAGNOSIS — Z3483 Encounter for supervision of other normal pregnancy, third trimester: Secondary | ICD-10-CM

## 2013-01-13 DIAGNOSIS — Z3493 Encounter for supervision of normal pregnancy, unspecified, third trimester: Secondary | ICD-10-CM

## 2013-01-13 DIAGNOSIS — O360990 Maternal care for other rhesus isoimmunization, unspecified trimester, not applicable or unspecified: Secondary | ICD-10-CM

## 2013-01-13 DIAGNOSIS — O368190 Decreased fetal movements, unspecified trimester, not applicable or unspecified: Secondary | ICD-10-CM

## 2013-01-13 DIAGNOSIS — O368131 Decreased fetal movements, third trimester, fetus 1: Secondary | ICD-10-CM

## 2013-01-13 DIAGNOSIS — Z1389 Encounter for screening for other disorder: Secondary | ICD-10-CM

## 2013-01-13 DIAGNOSIS — Z331 Pregnant state, incidental: Secondary | ICD-10-CM

## 2013-01-13 LAB — OB RESULTS CONSOLE GC/CHLAMYDIA: Gonorrhea: NEGATIVE

## 2013-01-13 NOTE — Progress Notes (Signed)
Pt here today for routine visit. Pt states she is having a lot of pressure and pain on her sides. The pt also states she has not felt the baby move as much today. Pt denies any other issues or problems at this time.

## 2013-01-13 NOTE — Patient Instructions (Signed)
Fetal Movement Counts Patient Name: __________________________________________________ Patient Due Date: ____________________ Performing a fetal movement count is highly recommended in high-risk pregnancies, but it is good for every pregnant woman to do. Your caregiver may ask you to start counting fetal movements at 28 weeks of the pregnancy. Fetal movements often increase:  After eating a full meal.  After physical activity.  After eating or drinking something sweet or cold.  At rest. Pay attention to when you feel the baby is most active. This will help you notice a pattern of your baby's sleep and wake cycles and what factors contribute to an increase in fetal movement. It is important to perform a fetal movement count at the same time each day when your baby is normally most active.  HOW TO COUNT FETAL MOVEMENTS 1. Find a quiet and comfortable area to sit or lie down on your left side. Lying on your left side provides the best blood and oxygen circulation to your baby. 2. Write down the day and time on a sheet of paper or in a journal. 3. Start counting kicks, flutters, swishes, rolls, or jabs in a 2 hour period. You should feel at least 10 movements within 2 hours. 4. If you do not feel 10 movements in 2 hours, wait 2 3 hours and count again. Look for a change in the pattern or not enough counts in 2 hours. SEEK MEDICAL CARE IF:  You feel less than 10 counts in 2 hours, tried twice.  There is no movement in over an hour.  The pattern is changing or taking longer each day to reach 10 counts in 2 hours.  You feel the baby is not moving as he or she usually does. Date: ____________ Movements: ____________ Start time: ____________ Theresa Sawyer time: ____________  Date: ____________ Movements: ____________ Start time: ____________ Theresa Sawyer time: ____________ Date: ____________ Movements: ____________ Start time: ____________ Theresa Sawyer time: ____________ Date: ____________ Movements: ____________  Start time: ____________ Theresa Sawyer time: ____________ Date: ____________ Movements: ____________ Start time: ____________ Theresa Sawyer time: ____________ Date: ____________ Movements: ____________ Start time: ____________ Theresa Sawyer time: ____________ Date: ____________ Movements: ____________ Start time: ____________ Theresa Sawyer time: ____________ Date: ____________ Movements: ____________ Start time: ____________ Theresa Sawyer time: ____________  Date: ____________ Movements: ____________ Start time: ____________ Theresa Sawyer time: ____________ Date: ____________ Movements: ____________ Start time: ____________ Theresa Sawyer time: ____________ Date: ____________ Movements: ____________ Start time: ____________ Theresa Sawyer time: ____________ Date: ____________ Movements: ____________ Start time: ____________ Theresa Sawyer time: ____________ Date: ____________ Movements: ____________ Start time: ____________ Theresa Sawyer time: ____________ Date: ____________ Movements: ____________ Start time: ____________ Theresa Sawyer time: ____________ Date: ____________ Movements: ____________ Start time: ____________ Theresa Sawyer time: ____________  Date: ____________ Movements: ____________ Start time: ____________ Theresa Sawyer time: ____________ Date: ____________ Movements: ____________ Start time: ____________ Theresa Sawyer time: ____________ Date: ____________ Movements: ____________ Start time: ____________ Theresa Sawyer time: ____________ Date: ____________ Movements: ____________ Start time: ____________ Theresa Sawyer time: ____________ Date: ____________ Movements: ____________ Start time: ____________ Theresa Sawyer time: ____________ Date: ____________ Movements: ____________ Start time: ____________ Theresa Sawyer time: ____________ Date: ____________ Movements: ____________ Start time: ____________ Theresa Sawyer time: ____________  Date: ____________ Movements: ____________ Start time: ____________ Theresa Sawyer time: ____________ Date: ____________ Movements: ____________ Start time: ____________ Theresa Sawyer time:  ____________ Date: ____________ Movements: ____________ Start time: ____________ Theresa Sawyer time: ____________ Date: ____________ Movements: ____________ Start time: ____________ Theresa Sawyer time: ____________ Date: ____________ Movements: ____________ Start time: ____________ Theresa Sawyer time: ____________ Date: ____________ Movements: ____________ Start time: ____________ Theresa Sawyer time: ____________ Date: ____________ Movements: ____________ Start time: ____________ Theresa Sawyer time: ____________  Date: ____________ Movements: ____________ Start time: ____________ Theresa Sawyer  time: ____________ Date: ____________ Movements: ____________ Start time: ____________ Theresa Sawyer time: ____________ Date: ____________ Movements: ____________ Start time: ____________ Theresa Sawyer time: ____________ Date: ____________ Movements: ____________ Start time: ____________ Theresa Sawyer time: ____________ Date: ____________ Movements: ____________ Start time: ____________ Theresa Sawyer time: ____________ Date: ____________ Movements: ____________ Start time: ____________ Theresa Sawyer time: ____________ Date: ____________ Movements: ____________ Start time: ____________ Theresa Sawyer time: ____________  Date: ____________ Movements: ____________ Start time: ____________ Theresa Sawyer time: ____________ Date: ____________ Movements: ____________ Start time: ____________ Theresa Sawyer time: ____________ Date: ____________ Movements: ____________ Start time: ____________ Theresa Sawyer time: ____________ Date: ____________ Movements: ____________ Start time: ____________ Theresa Sawyer time: ____________ Date: ____________ Movements: ____________ Start time: ____________ Theresa Sawyer time: ____________ Date: ____________ Movements: ____________ Start time: ____________ Theresa Sawyer time: ____________ Date: ____________ Movements: ____________ Start time: ____________ Theresa Sawyer time: ____________  Date: ____________ Movements: ____________ Start time: ____________ Theresa Sawyer time: ____________ Date: ____________ Movements:  ____________ Start time: ____________ Theresa Sawyer time: ____________ Date: ____________ Movements: ____________ Start time: ____________ Theresa Sawyer time: ____________ Date: ____________ Movements: ____________ Start time: ____________ Theresa Sawyer time: ____________ Date: ____________ Movements: ____________ Start time: ____________ Theresa Sawyer time: ____________ Date: ____________ Movements: ____________ Start time: ____________ Theresa Sawyer time: ____________ Date: ____________ Movements: ____________ Start time: ____________ Theresa Sawyer time: ____________  Date: ____________ Movements: ____________ Start time: ____________ Theresa Sawyer time: ____________ Date: ____________ Movements: ____________ Start time: ____________ Theresa Sawyer time: ____________ Date: ____________ Movements: ____________ Start time: ____________ Theresa Sawyer time: ____________ Date: ____________ Movements: ____________ Start time: ____________ Theresa Sawyer time: ____________ Date: ____________ Movements: ____________ Start time: ____________ Theresa Sawyer time: ____________ Date: ____________ Movements: ____________ Start time: ____________ Theresa Sawyer time: ____________ Document Released: 07/26/2006 Document Revised: 06/12/2012 Document Reviewed: 04/22/2012 [employer] Patient Information 2014 [place].

## 2013-01-13 NOTE — Progress Notes (Signed)
Pt here for GBS and GC/CHL, and they were obtained, and has noticed decreased fetal movement and some pain in vagina and irritability.FHR 140 on doppler and baby vertex,cervix feels high and closed. No leaking of fluid. Will get NST today for Decreased fetal movement and follow up in 1 week.

## 2013-01-13 NOTE — Patient Instructions (Addendum)
Fetal Movement Counts Patient Name: __________________________________________________ Patient Due Date: ____________________ Performing a fetal movement count is highly recommended in high-risk pregnancies, but it is good for every pregnant woman to do. Your caregiver may ask you to start counting fetal movements at 28 weeks of the pregnancy. Fetal movements often increase:  After eating a full meal.  After physical activity.  After eating or drinking something sweet or cold.  At rest. Pay attention to when you feel the baby is most active. This will help you notice a pattern of your baby's sleep and wake cycles and what factors contribute to an increase in fetal movement. It is important to perform a fetal movement count at the same time each day when your baby is normally most active.  HOW TO COUNT FETAL MOVEMENTS 1. Find a quiet and comfortable area to sit or lie down on your left side. Lying on your left side provides the best blood and oxygen circulation to your baby. 2. Write down the day and time on a sheet of paper or in a journal. 3. Start counting kicks, flutters, swishes, rolls, or jabs in a 2 hour period. You should feel at least 10 movements within 2 hours. 4. If you do not feel 10 movements in 2 hours, wait 2 3 hours and count again. Look for a change in the pattern or not enough counts in 2 hours. SEEK MEDICAL CARE IF:  You feel less than 10 counts in 2 hours, tried twice.  There is no movement in over an hour.  The pattern is changing or taking longer each day to reach 10 counts in 2 hours.  You feel the baby is not moving as he or she usually does. Date: ____________ Movements: ____________ Start time: ____________ Elizebeth Koller time: ____________  Date: ____________ Movements: ____________ Start time: ____________ Elizebeth Koller time: ____________ Date: ____________ Movements: ____________ Start time: ____________ Elizebeth Koller time: ____________ Date: ____________ Movements: ____________  Start time: ____________ Elizebeth Koller time: ____________ Date: ____________ Movements: ____________ Start time: ____________ Elizebeth Koller time: ____________ Date: ____________ Movements: ____________ Start time: ____________ Elizebeth Koller time: ____________ Date: ____________ Movements: ____________ Start time: ____________ Elizebeth Koller time: ____________ Date: ____________ Movements: ____________ Start time: ____________ Elizebeth Koller time: ____________  Date: ____________ Movements: ____________ Start time: ____________ Elizebeth Koller time: ____________ Date: ____________ Movements: ____________ Start time: ____________ Elizebeth Koller time: ____________ Date: ____________ Movements: ____________ Start time: ____________ Elizebeth Koller time: ____________ Date: ____________ Movements: ____________ Start time: ____________ Elizebeth Koller time: ____________ Date: ____________ Movements: ____________ Start time: ____________ Elizebeth Koller time: ____________ Date: ____________ Movements: ____________ Start time: ____________ Elizebeth Koller time: ____________ Date: ____________ Movements: ____________ Start time: ____________ Elizebeth Koller time: ____________  Date: ____________ Movements: ____________ Start time: ____________ Elizebeth Koller time: ____________ Date: ____________ Movements: ____________ Start time: ____________ Elizebeth Koller time: ____________ Date: ____________ Movements: ____________ Start time: ____________ Elizebeth Koller time: ____________ Date: ____________ Movements: ____________ Start time: ____________ Elizebeth Koller time: ____________ Date: ____________ Movements: ____________ Start time: ____________ Elizebeth Koller time: ____________ Date: ____________ Movements: ____________ Start time: ____________ Elizebeth Koller time: ____________ Date: ____________ Movements: ____________ Start time: ____________ Elizebeth Koller time: ____________  Date: ____________ Movements: ____________ Start time: ____________ Elizebeth Koller time: ____________ Date: ____________ Movements: ____________ Start time: ____________ Elizebeth Koller time:  ____________ Date: ____________ Movements: ____________ Start time: ____________ Elizebeth Koller time: ____________ Date: ____________ Movements: ____________ Start time: ____________ Elizebeth Koller time: ____________ Date: ____________ Movements: ____________ Start time: ____________ Elizebeth Koller time: ____________ Date: ____________ Movements: ____________ Start time: ____________ Elizebeth Koller time: ____________ Date: ____________ Movements: ____________ Start time: ____________ Elizebeth Koller time: ____________  Date: ____________ Movements: ____________ Start time: ____________ Elizebeth Koller  time: ____________ Date: ____________ Movements: ____________ Start time: ____________ Elizebeth Koller time: ____________ Date: ____________ Movements: ____________ Start time: ____________ Elizebeth Koller time: ____________ Date: ____________ Movements: ____________ Start time: ____________ Elizebeth Koller time: ____________ Date: ____________ Movements: ____________ Start time: ____________ Elizebeth Koller time: ____________ Date: ____________ Movements: ____________ Start time: ____________ Elizebeth Koller time: ____________ Date: ____________ Movements: ____________ Start time: ____________ Elizebeth Koller time: ____________  Date: ____________ Movements: ____________ Start time: ____________ Elizebeth Koller time: ____________ Date: ____________ Movements: ____________ Start time: ____________ Elizebeth Koller time: ____________ Date: ____________ Movements: ____________ Start time: ____________ Elizebeth Koller time: ____________ Date: ____________ Movements: ____________ Start time: ____________ Elizebeth Koller time: ____________ Date: ____________ Movements: ____________ Start time: ____________ Elizebeth Koller time: ____________ Date: ____________ Movements: ____________ Start time: ____________ Elizebeth Koller time: ____________ Date: ____________ Movements: ____________ Start time: ____________ Elizebeth Koller time: ____________  Date: ____________ Movements: ____________ Start time: ____________ Elizebeth Koller time: ____________ Date: ____________ Movements:  ____________ Start time: ____________ Elizebeth Koller time: ____________ Date: ____________ Movements: ____________ Start time: ____________ Elizebeth Koller time: ____________ Date: ____________ Movements: ____________ Start time: ____________ Elizebeth Koller time: ____________ Date: ____________ Movements: ____________ Start time: ____________ Elizebeth Koller time: ____________ Date: ____________ Movements: ____________ Start time: ____________ Elizebeth Koller time: ____________ Date: ____________ Movements: ____________ Start time: ____________ Elizebeth Koller time: ____________  Date: ____________ Movements: ____________ Start time: ____________ Elizebeth Koller time: ____________ Date: ____________ Movements: ____________ Start time: ____________ Elizebeth Koller time: ____________ Date: ____________ Movements: ____________ Start time: ____________ Elizebeth Koller time: ____________ Date: ____________ Movements: ____________ Start time: ____________ Elizebeth Koller time: ____________ Date: ____________ Movements: ____________ Start time: ____________ Elizebeth Koller time: ____________ Date: ____________ Movements: ____________ Start time: ____________ Elizebeth Koller time: ____________ Document Released: 07/26/2006 Document Revised: 06/12/2012 Document Reviewed: 04/22/2012 [employer] Patient Information 2014 [place]. Follow up in 1 week

## 2013-01-14 LAB — POCT URINALYSIS DIPSTICK
Blood, UA: NEGATIVE
Nitrite, UA: NEGATIVE
Protein, UA: NEGATIVE

## 2013-01-14 LAB — GC/CHLAMYDIA PROBE AMP
CT Probe RNA: NEGATIVE
GC Probe RNA: NEGATIVE

## 2013-01-15 ENCOUNTER — Encounter (HOSPITAL_COMMUNITY): Payer: Self-pay | Admitting: *Deleted

## 2013-01-15 ENCOUNTER — Inpatient Hospital Stay (HOSPITAL_COMMUNITY)
Admission: AD | Admit: 2013-01-15 | Discharge: 2013-01-15 | Disposition: A | Discharge status: Home / Self Care | Payer: Medicaid Other | Source: Ambulatory Visit | Attending: Obstetrics & Gynecology | Admitting: Obstetrics & Gynecology

## 2013-01-15 DIAGNOSIS — B9689 Other specified bacterial agents as the cause of diseases classified elsewhere: Secondary | ICD-10-CM

## 2013-01-15 DIAGNOSIS — O360990 Maternal care for other rhesus isoimmunization, unspecified trimester, not applicable or unspecified: Secondary | ICD-10-CM

## 2013-01-15 DIAGNOSIS — O99891 Other specified diseases and conditions complicating pregnancy: Secondary | ICD-10-CM | POA: Insufficient documentation

## 2013-01-15 DIAGNOSIS — A499 Bacterial infection, unspecified: Secondary | ICD-10-CM | POA: Insufficient documentation

## 2013-01-15 DIAGNOSIS — O2390 Unspecified genitourinary tract infection in pregnancy, unspecified trimester: Secondary | ICD-10-CM | POA: Insufficient documentation

## 2013-01-15 DIAGNOSIS — O0933 Supervision of pregnancy with insufficient antenatal care, third trimester: Secondary | ICD-10-CM

## 2013-01-15 DIAGNOSIS — Z3483 Encounter for supervision of other normal pregnancy, third trimester: Secondary | ICD-10-CM

## 2013-01-15 DIAGNOSIS — N760 Acute vaginitis: Secondary | ICD-10-CM | POA: Insufficient documentation

## 2013-01-15 DIAGNOSIS — O360130 Maternal care for anti-D [Rh] antibodies, third trimester, not applicable or unspecified: Secondary | ICD-10-CM

## 2013-01-15 LAB — WET PREP, GENITAL: Yeast Wet Prep HPF POC: NONE SEEN

## 2013-01-15 LAB — AMNISURE RUPTURE OF MEMBRANE (ROM) NOT AT ARMC: Amnisure ROM: NEGATIVE

## 2013-01-15 LAB — STREP B DNA PROBE: GBSP: NEGATIVE

## 2013-01-15 MED ORDER — METRONIDAZOLE 500 MG PO TABS: 500.0000 mg | ORAL_TABLET | Freq: Two times a day (BID) | ORAL | Status: AC

## 2013-01-15 NOTE — MAU Note (Signed)
PT SAYS  SHE WENT TO B-ROOM AT 0430-  WHEN FINISHED-  GUSH OF FLUID-    THEN FLUID STOPPED.    NO FLUID NOW-- NO PAD. NO UC'S.   VE IN OFFICE- CLOSED.     DENIES HSV AND MRSA.

## 2013-01-15 NOTE — MAU Provider Note (Signed)
First Provider Initiated Contact with Patient 01/15/13 0600      Chief Complaint:  Possible ROM  Theresa Sawyer is  32 y.o. G3P0020 at 23w2dpresents complaining of possible ROM.  She states she went to the bathroom at 0430, then had a gush of fluid when she went to get some water. States is puddled on the floor.  No leaking now.  Not feeling contractions Obstetrical/Gynecological History: OB History   Grav Para Term Preterm Abortions TAB SAB Ect Mult Living   3 0 0 0 2 1 1 0 0 0      Past Medical History: Past Medical History  Diagnosis Date  . Medical history non-contributory     Past Surgical History: Past Surgical History  Procedure Laterality Date  . No past surgeries      Family History: Family History  Problem Relation Age of Onset  . Diabetes Maternal Grandmother   . Diabetes Paternal Grandmother   . Heart attack Brother     Social History: History  Substance Use Topics  . Smoking status: Current Some Day Smoker -- 0.50 packs/day    Types: Cigarettes  . Smokeless tobacco: Not on file  . Alcohol Use: No    Allergies: No Known Allergies  Meds:  Prescriptions prior to admission  Medication Sig Dispense Refill  . Pediatric Multiple Vit-C-FA (FLINSTONES GUMMIES OMEGA-3 DHA) CHEW Chew by mouth. Take 2 daily        Review of Systems   Constitutional: Negative for fever and chills Eyes: Negative for visual disturbances Respiratory: Negative for shortness of breath, dyspnea Cardiovascular: Negative for chest pain or palpitations  Gastrointestinal: Negative for vomiting, diarrhea and constipation Genitourinary: Negative for dysuria and urgency Musculoskeletal: Negative for back pain, joint pain, myalgias  Neurological: Negative for dizziness and headaches     Physical Exam  Blood pressure 121/73, pulse 98, temperature 97.9 F (36.6 C), temperature source Oral, resp. rate 20, height 5' 4"  (1.626 m), weight 85.39 kg (188 lb 4 oz). GENERAL: Well-developed,  well-nourished female in no acute distress.  LUNGS: Clear to auscultation bilaterally.  HEART: Regular rate and rhythm. ABDOMEN: Soft, nontender, nondistended, gravid.  EXTREMITIES: Nontender, no edema, 2+ distal pulses. CERVICAL EXAM: Dilatation 1cm   Effacement 60%   Station -2   Presentation: cephalic FHT:  Baseline rate 140 bpm   Variability moderate  Accelerations present   Decelerations none Contractions: Every 2-5 mins, not felt by pt   Labs: Results for orders placed during the hospital encounter of 01/15/13 (from the past 24 hour(s))  WET PREP, GENITAL   Collection Time    01/15/13  6:00 AM      Result Value Range   Yeast Wet Prep HPF POC NONE SEEN  NONE SEEN   Trich, Wet Prep NONE SEEN  NONE SEEN   Clue Cells Wet Prep HPF POC MANY (*) NONE SEEN   WBC, Wet Prep HPF POC MODERATE BACTERIA SEEN (*) NONE SEEN  AMNISURE RUPTURE OF MEMBRANE (ROM)   Collection Time    01/15/13  6:00 AM      Result Value Range   Amnisure ROM NEGATIVE    Results for orders placed in visit on 01/13/13 (from the past 24 hour(s))  POCT URINALYSIS DIPSTICK   Collection Time    01/14/13 11:05 AM      Result Value Range   Color, UA       Clarity, UA       Glucose, UA neg     Bilirubin,  UA       Ketones, UA neg     Spec Grav, UA       Blood, UA neg     pH, UA       Protein, UA neg     Urobilinogen, UA       Nitrite, UA neg     Leukocytes, UA Negative     Imaging Studies:  No results found.  Assessment: Theresa Sawyer is  32 y.o. G3P0020 at 80w2dpresents with no evidence of ROM  BV.  Plan: DC home; treat with flagyl 5020mpo BID X 7  CRESENZO-DISHMAN,Margherita Collyer 7/9/20146:18 AM

## 2013-01-21 ENCOUNTER — Ambulatory Visit (INDEPENDENT_AMBULATORY_CARE_PROVIDER_SITE_OTHER): Payer: Medicaid Other | Admitting: Women's Health

## 2013-01-21 ENCOUNTER — Encounter: Payer: Self-pay | Admitting: Women's Health

## 2013-01-21 VITALS — BP 138/84 | Wt 191.0 lb

## 2013-01-21 DIAGNOSIS — Z3483 Encounter for supervision of other normal pregnancy, third trimester: Secondary | ICD-10-CM

## 2013-01-21 DIAGNOSIS — O99019 Anemia complicating pregnancy, unspecified trimester: Secondary | ICD-10-CM

## 2013-01-21 DIAGNOSIS — O09299 Supervision of pregnancy with other poor reproductive or obstetric history, unspecified trimester: Secondary | ICD-10-CM

## 2013-01-21 DIAGNOSIS — Z331 Pregnant state, incidental: Secondary | ICD-10-CM

## 2013-01-21 DIAGNOSIS — O360990 Maternal care for other rhesus isoimmunization, unspecified trimester, not applicable or unspecified: Secondary | ICD-10-CM

## 2013-01-21 DIAGNOSIS — Z1389 Encounter for screening for other disorder: Secondary | ICD-10-CM

## 2013-01-21 LAB — POCT URINALYSIS DIPSTICK: Nitrite, UA: NEGATIVE

## 2013-01-21 NOTE — Progress Notes (Signed)
Reports good fm. Denies regular/painful uc's, lof, vb, urinary frequency, urgency, hesitancy, or dysuria.  Denies ha, scotomata, ruq/epigastric pain, n/v.  DTRs 2+, no clonus, trace BLE edema. No complaints.  Reviewed labor s/s, pre-e s/s, fetal kick counts.  All questions answered. F/U in 1wk for visit.

## 2013-01-21 NOTE — Patient Instructions (Signed)

## 2013-01-21 NOTE — Progress Notes (Signed)
Having lots of pressure.

## 2013-01-25 ENCOUNTER — Inpatient Hospital Stay (HOSPITAL_COMMUNITY)
Admission: AD | Admit: 2013-01-25 | Discharge: 2013-01-25 | Disposition: A | Discharge status: Home / Self Care | Payer: Medicaid Other | Source: Ambulatory Visit | Attending: Obstetrics and Gynecology | Admitting: Obstetrics and Gynecology

## 2013-01-25 ENCOUNTER — Encounter (HOSPITAL_COMMUNITY): Payer: Self-pay | Admitting: *Deleted

## 2013-01-25 DIAGNOSIS — O0933 Supervision of pregnancy with insufficient antenatal care, third trimester: Secondary | ICD-10-CM

## 2013-01-25 DIAGNOSIS — O479 False labor, unspecified: Secondary | ICD-10-CM | POA: Insufficient documentation

## 2013-01-25 NOTE — MAU Note (Addendum)
Theresa Sawyer is here for a labor evaluation. She is not having consistent contractions however she did pass some vaginal  mucous a few days ago. She is [redacted]w[redacted]d

## 2013-01-27 ENCOUNTER — Ambulatory Visit (INDEPENDENT_AMBULATORY_CARE_PROVIDER_SITE_OTHER): Payer: Medicaid Other | Admitting: Obstetrics & Gynecology

## 2013-01-27 ENCOUNTER — Encounter: Payer: Self-pay | Admitting: Obstetrics & Gynecology

## 2013-01-27 VITALS — BP 120/80 | Wt 192.0 lb

## 2013-01-27 DIAGNOSIS — O360990 Maternal care for other rhesus isoimmunization, unspecified trimester, not applicable or unspecified: Secondary | ICD-10-CM

## 2013-01-27 DIAGNOSIS — Z3483 Encounter for supervision of other normal pregnancy, third trimester: Secondary | ICD-10-CM

## 2013-01-27 DIAGNOSIS — O99019 Anemia complicating pregnancy, unspecified trimester: Secondary | ICD-10-CM

## 2013-01-27 DIAGNOSIS — O09299 Supervision of pregnancy with other poor reproductive or obstetric history, unspecified trimester: Secondary | ICD-10-CM

## 2013-01-27 NOTE — Progress Notes (Signed)
BP weight and urine results all reviewed and noted. Patient reports good fetal movement, denies any bleeding and no rupture of membranes symptoms or regular contractions. Patient is without complaints. All questions were answered.

## 2013-01-27 NOTE — Patient Instructions (Signed)

## 2013-01-27 NOTE — Progress Notes (Signed)
WENT TO [place] ON Saturday, HAVING CONTRACTIONS.

## 2013-01-28 ENCOUNTER — Encounter: Payer: Medicaid Other | Admitting: Women's Health

## 2013-01-29 ENCOUNTER — Ambulatory Visit (INDEPENDENT_AMBULATORY_CARE_PROVIDER_SITE_OTHER): Payer: Medicaid Other | Admitting: Obstetrics and Gynecology

## 2013-01-29 VITALS — BP 112/70 | Wt 186.8 lb

## 2013-01-29 DIAGNOSIS — Z3403 Encounter for supervision of normal first pregnancy, third trimester: Secondary | ICD-10-CM

## 2013-01-29 DIAGNOSIS — Z331 Pregnant state, incidental: Secondary | ICD-10-CM

## 2013-01-29 DIAGNOSIS — Z8719 Personal history of other diseases of the digestive system: Secondary | ICD-10-CM

## 2013-01-29 DIAGNOSIS — O09299 Supervision of pregnancy with other poor reproductive or obstetric history, unspecified trimester: Secondary | ICD-10-CM

## 2013-01-29 DIAGNOSIS — O360990 Maternal care for other rhesus isoimmunization, unspecified trimester, not applicable or unspecified: Secondary | ICD-10-CM

## 2013-01-29 DIAGNOSIS — O99019 Anemia complicating pregnancy, unspecified trimester: Secondary | ICD-10-CM

## 2013-01-29 DIAGNOSIS — Z3009 Encounter for other general counseling and advice on contraception: Secondary | ICD-10-CM | POA: Insufficient documentation

## 2013-01-29 DIAGNOSIS — Z1389 Encounter for screening for other disorder: Secondary | ICD-10-CM

## 2013-01-29 LAB — POCT URINALYSIS DIPSTICK
Blood, UA: NEGATIVE
Protein, UA: NEGATIVE

## 2013-01-29 MED ORDER — OMEPRAZOLE 40 MG PO CPDR: 40.0000 mg | DELAYED_RELEASE_CAPSULE | Freq: Every day | ORAL | Status: DC

## 2013-01-29 NOTE — Patient Instructions (Addendum)
Take prilosec daily

## 2013-01-29 NOTE — Progress Notes (Signed)
Pt here today for routine visit. Pt states that she is having pain and pressure in the lower part of her stomach and in her lower back. Pt denies any gush of fluid or bleeding. Pt denies any other problems or issues at this time. Vomited x 1 , attributed to GERD. Good FM, no bleeding.

## 2013-01-31 ENCOUNTER — Inpatient Hospital Stay (HOSPITAL_COMMUNITY)
Admission: AD | Admit: 2013-01-31 | Discharge: 2013-01-31 | Disposition: A | Discharge status: Home / Self Care | Payer: Medicaid Other | Source: Ambulatory Visit | Attending: Obstetrics & Gynecology | Admitting: Obstetrics & Gynecology

## 2013-01-31 ENCOUNTER — Encounter (HOSPITAL_COMMUNITY): Payer: Self-pay | Admitting: Family

## 2013-01-31 DIAGNOSIS — O479 False labor, unspecified: Secondary | ICD-10-CM | POA: Insufficient documentation

## 2013-01-31 DIAGNOSIS — O0933 Supervision of pregnancy with insufficient antenatal care, third trimester: Secondary | ICD-10-CM

## 2013-01-31 HISTORY — DX: Anxiety disorder, unspecified: F41.9

## 2013-01-31 NOTE — MAU Note (Signed)
Theresa Sawyer is here for a labor evaluation and for ? ROM. She is [redacted]w[redacted]d says she started noticing the leaking more today.

## 2013-01-31 NOTE — MAU Note (Signed)
Patient presents to MAU with c/o irregular contraction pattern throughout the day. Reports clear fluid discharge since 1330 today. Denies VB. Reports +FM. Denies HSV.

## 2013-02-03 ENCOUNTER — Ambulatory Visit (INDEPENDENT_AMBULATORY_CARE_PROVIDER_SITE_OTHER): Payer: Medicaid Other | Admitting: Women's Health

## 2013-02-03 ENCOUNTER — Encounter: Payer: Self-pay | Admitting: Women's Health

## 2013-02-03 VITALS — BP 110/60 | Wt 188.0 lb

## 2013-02-03 DIAGNOSIS — Z3483 Encounter for supervision of other normal pregnancy, third trimester: Secondary | ICD-10-CM

## 2013-02-03 DIAGNOSIS — O99019 Anemia complicating pregnancy, unspecified trimester: Secondary | ICD-10-CM

## 2013-02-03 DIAGNOSIS — Z331 Pregnant state, incidental: Secondary | ICD-10-CM

## 2013-02-03 DIAGNOSIS — O09299 Supervision of pregnancy with other poor reproductive or obstetric history, unspecified trimester: Secondary | ICD-10-CM

## 2013-02-03 DIAGNOSIS — Z3009 Encounter for other general counseling and advice on contraception: Secondary | ICD-10-CM

## 2013-02-03 DIAGNOSIS — Z1389 Encounter for screening for other disorder: Secondary | ICD-10-CM

## 2013-02-03 DIAGNOSIS — O360990 Maternal care for other rhesus isoimmunization, unspecified trimester, not applicable or unspecified: Secondary | ICD-10-CM

## 2013-02-03 LAB — POCT URINALYSIS DIPSTICK
Blood, UA: NEGATIVE
Ketones, UA: NEGATIVE

## 2013-02-03 NOTE — Patient Instructions (Addendum)
Your induction is scheduled for 7/31 @ 7am. Go to [facility] hospital, Maternity Admissions Unit (Emergency) entrance and let them know you are there to be induced. They will send someone from Labor & Delivery to come get you.   Braxton Hicks Contractions Pregnancy is commonly associated with contractions of the uterus throughout the pregnancy. Towards the end of pregnancy (32 to 34 weeks), these contractions [facility]) can develop more often and may become more forceful. This is not true labor because these contractions do not result in opening (dilatation) and thinning of the cervix. They are sometimes difficult to tell apart from true labor because these contractions can be forceful and people have different pain tolerances. You should not feel embarrassed if you go to the hospital with false labor. Sometimes, the only way to tell if you are in true labor is for your caregiver to follow the changes in the cervix. How to tell the difference between true and false labor:  False labor.  The contractions of false labor are usually shorter, irregular and not as hard as those of true labor.  They are often felt in the front of the lower abdomen and in the groin.  They may leave with walking around or changing positions while lying down.  They get weaker and are shorter lasting as time goes on.  These contractions are usually irregular.  They do not usually become progressively stronger, regular and closer together as with true labor.  True labor.  Contractions in true labor last 30 to 70 seconds, become very regular, usually become more intense, and increase in frequency.  They do not go away with walking.  The discomfort is usually felt in the top of the uterus and spreads to the lower abdomen and low back.  True labor can be determined by your caregiver with an exam. This will show that the cervix is dilating and getting thinner. If there are no prenatal problems or other health problems  associated with the pregnancy, it is completely safe to be sent home with false labor and await the onset of true labor. HOME CARE INSTRUCTIONS   Keep up with your usual exercises and instructions.  Take medications as directed.  Keep your regular prenatal appointment.  Eat and drink lightly if you think you are going into labor.  If BH contractions are making you uncomfortable:  Change your activity position from lying down or resting to walking/walking to resting.  Sit and rest in a tub of warm water.  Drink 2 to 3 glasses of water. Dehydration may cause B-H contractions.  Do slow and deep breathing several times an hour. SEEK IMMEDIATE MEDICAL CARE IF:   Your contractions continue to become stronger, more regular, and closer together.  You have a gushing, burst or leaking of fluid from the vagina.  An oral temperature above 102 F (38.9 C) develops.  You have passage of blood-tinged mucus.  You develop vaginal bleeding.  You develop continuous belly (abdominal) pain.  You have low back pain that you never had before.  You feel the baby's head pushing down causing pelvic pressure.  The baby is not moving as much as it used to. Document Released: 06/26/2005 Document Revised: 09/18/2011 Document Reviewed: 12/18/2008 [facility] Patient Information 2014 [place].

## 2013-02-03 NOTE — Progress Notes (Signed)
HAVING A LOT OF PRESSURE.

## 2013-02-03 NOTE — Progress Notes (Signed)
Reports good fm. Denies uc's, vb, urinary frequency, urgency, hesitancy, or dysuria.  Unbearable pressure, unable to work d/t pressure, and doesn't have leave. Very worried and tearful. LOF since Fri, went to MAU Fri and was told still intact and d/c'd, but has continued to leak.  SSE: neg pooling, some clear mucousy d/c noted. Fern neg. SVE: 3/80/-1, w/ bag of water palpated, vtx. Offered membrane sweeping, discussed r/b- pt decided to proceed, so membranes swept. Discussed w/ JVF, ok to offer elective IOL d/t unbearable pressure/social issues at this point. IOL scheduled for 7/21 @ 0700. Reviewed labor s/s, fetal kick counts.  All questions answered.

## 2013-02-04 ENCOUNTER — Encounter (HOSPITAL_COMMUNITY): Payer: Self-pay | Admitting: Anesthesiology

## 2013-02-04 ENCOUNTER — Inpatient Hospital Stay (HOSPITAL_COMMUNITY)
Admission: AD | Admit: 2013-02-04 | Discharge: 2013-02-06 | DRG: 767 | Disposition: A | Discharge status: Home / Self Care | Payer: Medicaid Other | Source: Ambulatory Visit | Attending: Family Medicine | Admitting: Family Medicine

## 2013-02-04 ENCOUNTER — Inpatient Hospital Stay (HOSPITAL_COMMUNITY): Payer: Medicaid Other | Admitting: Anesthesiology

## 2013-02-04 ENCOUNTER — Encounter (HOSPITAL_COMMUNITY): Payer: Self-pay | Admitting: *Deleted

## 2013-02-04 DIAGNOSIS — Z302 Encounter for sterilization: Secondary | ICD-10-CM

## 2013-02-04 DIAGNOSIS — O0940 Supervision of pregnancy with grand multiparity, unspecified trimester: Secondary | ICD-10-CM

## 2013-02-04 DIAGNOSIS — IMO0001 Reserved for inherently not codable concepts without codable children: Secondary | ICD-10-CM

## 2013-02-04 LAB — CBC
HCT: 35.8 % — ABNORMAL LOW (ref 36.0–46.0)
Hemoglobin: 12.4 g/dL (ref 12.0–15.0)
MCH: 29.6 pg (ref 26.0–34.0)
MCHC: 34.6 g/dL (ref 30.0–36.0)
MCV: 85.4 fL (ref 78.0–100.0)
RDW: 14.5 % (ref 11.5–15.5)

## 2013-02-04 MED ORDER — LACTATED RINGERS IV SOLN
500.0000 mL | INTRAVENOUS | Status: DC | PRN
Start: 2013-02-04 — End: 2013-02-04

## 2013-02-04 MED ORDER — ACETAMINOPHEN 325 MG PO TABS: 650.0000 mg | ORAL_TABLET | ORAL | Status: DC | PRN

## 2013-02-04 MED ORDER — ONDANSETRON HCL 4 MG PO TABS: 4.0000 mg | ORAL_TABLET | ORAL | Status: DC | PRN

## 2013-02-04 MED ORDER — LACTATED RINGERS IV SOLN: 500.0000 mL | Freq: Once | INTRAVENOUS | Status: DC

## 2013-02-04 MED ORDER — OXYTOCIN BOLUS FROM INFUSION: 500.0000 mL | INTRAVENOUS | Status: DC

## 2013-02-04 MED ORDER — TETANUS-DIPHTH-ACELL PERTUSSIS 5-2.5-18.5 LF-MCG/0.5 IM SUSP
0.5000 mL | Freq: Once | INTRAMUSCULAR | Status: AC
  Administered 2013-02-06: 0.5 mL via INTRAMUSCULAR

## 2013-02-04 MED ORDER — NALBUPHINE SYRINGE 5 MG/0.5 ML
5.0000 mg | INJECTION | INTRAMUSCULAR | Status: DC | PRN
  Administered 2013-02-04: 5 mg via INTRAVENOUS
  Filled 2013-02-04 (×2): qty 0.5

## 2013-02-04 MED ORDER — TERBUTALINE SULFATE 1 MG/ML IJ SOLN: 0.2500 mg | Freq: Once | INTRAMUSCULAR | Status: DC | PRN

## 2013-02-04 MED ORDER — IBUPROFEN 600 MG PO TABS: 600.0000 mg | ORAL_TABLET | Freq: Four times a day (QID) | ORAL | Status: DC | PRN

## 2013-02-04 MED ORDER — EPHEDRINE 5 MG/ML INJ
10.0000 mg | INTRAVENOUS | Status: DC | PRN
  Filled 2013-02-04: qty 4
  Filled 2013-02-04: qty 2

## 2013-02-04 MED ORDER — IBUPROFEN 600 MG PO TABS
600.0000 mg | ORAL_TABLET | Freq: Four times a day (QID) | ORAL | Status: DC
  Administered 2013-02-04 – 2013-02-06 (×7): 600 mg via ORAL
  Filled 2013-02-04 (×6): qty 1

## 2013-02-04 MED ORDER — BENZOCAINE-MENTHOL 20-0.5 % EX AERO
1.0000 "application " | INHALATION_SPRAY | CUTANEOUS | Status: DC | PRN
  Administered 2013-02-04 – 2013-02-06 (×2): 1 via TOPICAL
  Filled 2013-02-04 (×2): qty 56

## 2013-02-04 MED ORDER — ONDANSETRON HCL 4 MG/2ML IJ SOLN
4.0000 mg | Freq: Four times a day (QID) | INTRAMUSCULAR | Status: DC | PRN
  Administered 2013-02-04: 4 mg via INTRAVENOUS
  Filled 2013-02-04: qty 2

## 2013-02-04 MED ORDER — OXYTOCIN 40 UNITS IN LACTATED RINGERS INFUSION - SIMPLE MED
1.0000 m[IU]/min | INTRAVENOUS | Status: DC
  Administered 2013-02-04: 2 m[IU]/min via INTRAVENOUS
  Filled 2013-02-04: qty 1000

## 2013-02-04 MED ORDER — LANOLIN HYDROUS EX OINT: TOPICAL_OINTMENT | CUTANEOUS | Status: DC | PRN

## 2013-02-04 MED ORDER — FAMOTIDINE 20 MG PO TABS
40.0000 mg | ORAL_TABLET | Freq: Once | ORAL | Status: AC
  Administered 2013-02-05: 40 mg via ORAL
  Filled 2013-02-04: qty 2

## 2013-02-04 MED ORDER — OXYTOCIN 40 UNITS IN LACTATED RINGERS INFUSION - SIMPLE MED: 62.5000 mL/h | INTRAVENOUS | Status: DC

## 2013-02-04 MED ORDER — SENNOSIDES-DOCUSATE SODIUM 8.6-50 MG PO TABS
2.0000 | ORAL_TABLET | Freq: Every day | ORAL | Status: DC
  Administered 2013-02-04 – 2013-02-05 (×2): 2 via ORAL

## 2013-02-04 MED ORDER — ZOLPIDEM TARTRATE 5 MG PO TABS
5.0000 mg | ORAL_TABLET | Freq: Every evening | ORAL | Status: DC | PRN
  Administered 2013-02-06: 5 mg via ORAL
  Filled 2013-02-04: qty 1

## 2013-02-04 MED ORDER — PHENYLEPHRINE 40 MCG/ML (10ML) SYRINGE FOR IV PUSH (FOR BLOOD PRESSURE SUPPORT)
80.0000 ug | PREFILLED_SYRINGE | INTRAVENOUS | Status: DC | PRN
  Filled 2013-02-04: qty 2

## 2013-02-04 MED ORDER — WITCH HAZEL-GLYCERIN EX PADS: 1.0000 "application " | MEDICATED_PAD | CUTANEOUS | Status: DC | PRN

## 2013-02-04 MED ORDER — LIDOCAINE HCL (PF) 1 % IJ SOLN
30.0000 mL | INTRAMUSCULAR | Status: DC | PRN
  Administered 2013-02-04: 30 mL via SUBCUTANEOUS
  Filled 2013-02-04 (×2): qty 30

## 2013-02-04 MED ORDER — LACTATED RINGERS IV SOLN
INTRAVENOUS | Status: DC
  Administered 2013-02-04: 07:00:00 via INTRAVENOUS
  Administered 2013-02-04: 125 mL/h via INTRAVENOUS

## 2013-02-04 MED ORDER — PHENYLEPHRINE 40 MCG/ML (10ML) SYRINGE FOR IV PUSH (FOR BLOOD PRESSURE SUPPORT)
80.0000 ug | PREFILLED_SYRINGE | INTRAVENOUS | Status: DC | PRN
  Filled 2013-02-04: qty 5
  Filled 2013-02-04: qty 2

## 2013-02-04 MED ORDER — ONDANSETRON HCL 4 MG/2ML IJ SOLN: 4.0000 mg | INTRAMUSCULAR | Status: DC | PRN

## 2013-02-04 MED ORDER — SODIUM BICARBONATE 8.4 % IV SOLN
INTRAVENOUS | Status: DC | PRN
  Administered 2013-02-04: 5 mL via EPIDURAL

## 2013-02-04 MED ORDER — CITRIC ACID-SODIUM CITRATE 334-500 MG/5ML PO SOLN: 30.0000 mL | ORAL | Status: DC | PRN

## 2013-02-04 MED ORDER — FENTANYL 2.5 MCG/ML BUPIVACAINE 1/10 % EPIDURAL INFUSION (WH - ANES)
14.0000 mL/h | INTRAMUSCULAR | Status: DC | PRN
  Administered 2013-02-04 (×2): 14 mL/h via EPIDURAL
  Filled 2013-02-04 (×2): qty 125

## 2013-02-04 MED ORDER — OXYCODONE-ACETAMINOPHEN 5-325 MG PO TABS: 1.0000 | ORAL_TABLET | ORAL | Status: DC | PRN

## 2013-02-04 MED ORDER — PRENATAL MULTIVITAMIN CH
1.0000 | ORAL_TABLET | Freq: Every day | ORAL | Status: DC
  Administered 2013-02-05 – 2013-02-06 (×2): 1 via ORAL
  Filled 2013-02-04 (×2): qty 1

## 2013-02-04 MED ORDER — EPHEDRINE 5 MG/ML INJ
10.0000 mg | INTRAVENOUS | Status: DC | PRN
  Filled 2013-02-04: qty 2

## 2013-02-04 MED ORDER — OXYCODONE-ACETAMINOPHEN 5-325 MG PO TABS
1.0000 | ORAL_TABLET | ORAL | Status: DC | PRN
  Administered 2013-02-05 (×2): 1 via ORAL
  Administered 2013-02-05: 2 via ORAL
  Administered 2013-02-05: 1 via ORAL
  Administered 2013-02-06 (×3): 2 via ORAL
  Filled 2013-02-04 (×2): qty 1
  Filled 2013-02-04 (×3): qty 2
  Filled 2013-02-04: qty 1
  Filled 2013-02-04: qty 2

## 2013-02-04 MED ORDER — SIMETHICONE 80 MG PO CHEW
80.0000 mg | CHEWABLE_TABLET | ORAL | Status: DC | PRN
  Administered 2013-02-06: 80 mg via ORAL

## 2013-02-04 MED ORDER — DIPHENHYDRAMINE HCL 50 MG/ML IJ SOLN: 12.5000 mg | INTRAMUSCULAR | Status: DC | PRN

## 2013-02-04 MED ORDER — METOCLOPRAMIDE HCL 10 MG PO TABS
10.0000 mg | ORAL_TABLET | Freq: Once | ORAL | Status: AC
  Administered 2013-02-05: 10 mg via ORAL
  Filled 2013-02-04: qty 1

## 2013-02-04 MED ORDER — DIBUCAINE 1 % RE OINT: 1.0000 "application " | TOPICAL_OINTMENT | RECTAL | Status: DC | PRN

## 2013-02-04 MED ORDER — LACTATED RINGERS IV SOLN
INTRAVENOUS | Status: DC
  Administered 2013-02-05 (×2): via INTRAVENOUS

## 2013-02-04 MED ORDER — MEASLES, MUMPS & RUBELLA VAC ~~LOC~~ INJ
0.5000 mL | INJECTION | Freq: Once | SUBCUTANEOUS | Status: DC
  Filled 2013-02-04: qty 0.5

## 2013-02-04 MED ORDER — DIPHENHYDRAMINE HCL 25 MG PO CAPS: 25.0000 mg | ORAL_CAPSULE | Freq: Four times a day (QID) | ORAL | Status: DC | PRN

## 2013-02-04 NOTE — Progress Notes (Signed)
Theresa Sawyer is a 32 y.o. G3P0020 at 34w1dadmitted for active labor  Subjective: Patient comfortable with epidural. Feeling contractions a little bit more in the last half hour.  Objective: BP 122/83  Pulse 97  Temp(Src) 97.9 F (36.6 C) (Oral)  Resp 20  Ht 5' 5"  (1.651 m)  Wt 85.276 kg (188 lb)  BMI 31.28 kg/m2      FHT:  FHR: 140 bpm, variability: moderate,  accelerations:  Present,  decelerations:  Present 1 variable UC:   Poor tracing, q413m SVE:   Dilation: 4 Effacement (%): 80 Station: -2 Exam by:: Dr WiLamar BenesLabs: Lab Results  Component Value Date   WBC 21.7* 02/04/2013   HGB 12.4 02/04/2013   HCT 35.8* 02/04/2013   MCV 85.4 02/04/2013   PLT 263 02/04/2013    Assessment / Plan: Spontaneous labor, progressing normally IUPC placed. Will monitor for 30 minutes and if not adequate start on pitocin  Labor: Progressing normally Fetal Wellbeing:  Category I Pain Control:  Epidural I/D:  n/a Anticipated MOD:  NSVD  WiTawanna Sat/29/2014, 9:45 AM

## 2013-02-04 NOTE — MAU Note (Signed)
Water broke at 5am.

## 2013-02-04 NOTE — Anesthesia Procedure Notes (Signed)
Epidural Patient location during procedure: OB  Preanesthetic Checklist Completed: patient identified, site marked, surgical consent, pre-op evaluation, timeout performed, IV checked, risks and benefits discussed and monitors and equipment checked  Epidural Patient position: sitting Prep: site prepped and draped and DuraPrep Patient monitoring: continuous pulse ox and blood pressure Approach: midline Injection technique: LOR air  Needle:  Needle type: Tuohy  Needle gauge: 17 G Needle length: 9 cm and 9 Needle insertion depth: 5 cm cm Catheter type: closed end flexible Catheter size: 19 Gauge Catheter at skin depth: 10 cm Test dose: negative  Assessment Events: blood not aspirated, injection not painful, no injection resistance, negative IV test and no paresthesia  Additional Notes Dosing of Epidural:  1st dose, through catheter ............................................Marland Kitchen epi 1:200K + Xylocaine 40 mg  2nd dose, through catheter, after waiting 3 minutes...Marland KitchenMarland Kitchenepi 1:200K + Xylocaine 60 mg    ( 2% Xylo charted as a single dose in Epic Meds for ease of charting; actual dosing was fractionated as above, for saftey's sake)  As each dose occurred, patient was free of IV sx; and patient exhibited no evidence of SA injection.  Patient is more comfortable after epidural dosed. Please see RN's note for documentation of vital signs,and FHR which are stable.  Patient reminded not to try to ambulate with numb legs, and that an RN must be present when she attempts to get up.

## 2013-02-04 NOTE — Progress Notes (Signed)
Theresa Sawyer is a 32 y.o. G3P0020 at 29w1dadmitted for labor  Subjective: Comfortable with epidural.  Feeling mild pain with ctx.   Objective: BP 119/81  Pulse 91  Temp(Src) 98.1 F (36.7 C) (Oral)  Resp 20  Ht 5' 5"  (1.651 m)  Wt 85.276 kg (188 lb)  BMI 31.28 kg/m2   Total I/O In: -  Out: 300 [Urine:300]  FHT:  FHR: 130 bpm, variability: moderate,  accelerations:  Present,  decelerations:  Present variables  UC:   irregular, every 2-4 minutes SVE:   Dilation: 7 Effacement (%): 90 Station: -1 Exam by:: Dr BOlevia Bowens Labs: Lab Results  Component Value Date   WBC 21.7* 02/04/2013   HGB 12.4 02/04/2013   HCT 35.8* 02/04/2013   MCV 85.4 02/04/2013   PLT 263 02/04/2013    Assessment / Plan: Augmentation of labor, progressing well  Labor: progressing on pitocin. MVUs approx 150 but making change.  Preeclampsia:  na Fetal Wellbeing:  Category II Pain Control:  Epidural I/D:  n/a Anticipated MOD:  NSVD  Zyniah Ferraiolo L 02/04/2013, 12:00 PM

## 2013-02-04 NOTE — Anesthesia Preprocedure Evaluation (Signed)

## 2013-02-04 NOTE — H&P (Signed)
Chart reviewed and agree with management and plan.

## 2013-02-04 NOTE — H&P (Signed)
Theresa Sawyer is a 32 y.o. female presenting for labor evaluation and SROM  Maternal Medical History:  Reason for admission: Rupture of membranes and contractions.  Nausea.  Contractions: Frequency: regular.    Fetal activity: Perceived fetal activity is normal.   Last perceived fetal movement was within the past hour.    Prenatal Complications - Diabetes: none.    OB History   Grav Para Term Preterm Abortions TAB SAB Ect Mult Living   3 0 0 0 2 1 1 0 0 0      Past Medical History  Diagnosis Date  . Medical history non-contributory   . Anxiety    Past Surgical History  Procedure Laterality Date  . No past surgeries     Family History: family history includes Diabetes in her maternal grandmother and paternal grandmother and Heart attack in her brother. Social History:  reports that she has been smoking Cigarettes.  She has been smoking about 0.50 packs per day. She does not have any smokeless tobacco history on file. She reports that she does not drink alcohol or use illicit drugs.   Prenatal Transfer Tool  Maternal Diabetes: No Genetic Screening: Declined Maternal Ultrasounds/Referrals: Normal Fetal Ultrasounds or other Referrals:  None Maternal Substance Abuse:  No Significant Maternal Medications:  None Significant Maternal Lab Results:  None Other Comments:  None  Review of Systems  Constitutional: Negative for fever.  Eyes: Negative for blurred vision.  Cardiovascular: Negative for chest pain.  Gastrointestinal: Positive for abdominal pain (contractions ). Negative for nausea, vomiting, diarrhea and constipation.  Neurological: Negative for headaches.    Dilation: 4 Effacement (%): 70 Station: -2 Exam by:: L. Munford RN Blood pressure 135/88, pulse 115, temperature 97.6 F (36.4 C), temperature source Oral, resp. rate 20, height 5' 5"  (1.651 m), weight 85.276 kg (188 lb). Maternal Exam:  Uterine Assessment: Contraction strength is moderate.  Contraction  duration is 70 seconds. Contraction frequency is regular.   Introitus: Ferning test: positive.  Amniotic fluid character: clear.  Pelvis: adequate for delivery.   Cervix: Cervix evaluated by digital exam.     Fetal Exam Fetal Monitor Review: Mode: ultrasound.   Baseline rate: 145.  Variability: moderate (6-25 bpm).   Pattern: accelerations present and no decelerations.    Fetal State Assessment: Category I - tracings are normal.     Physical Exam  Nursing note and vitals reviewed. Constitutional: She is oriented to person, place, and time. She appears well-developed and well-nourished. No distress.  Cardiovascular: Normal rate.   Respiratory: Effort normal.  GI: Soft. There is no tenderness.  Neurological: She is alert and oriented to person, place, and time.  Skin: Skin is warm and dry.  Psychiatric: She has a normal mood and affect.    Prenatal labs: ABO, Rh: O/NEG/-- (06/13 0900) Antibody: NEG (06/13 0900) Rubella: 1.17 (06/13 0900) RPR: NON REAC (06/13 0900)  HBsAg: NEGATIVE (06/13 0900)  HIV: NON REACTIVE (06/13 0900)  GBS: NEGATIVE (07/07 1716)   Assessment/Plan: 1. Active labor at term   SROM Reassuring M-F status Admit to L&D Routine orders   [name] 02/04/2013, 6:43 AM

## 2013-02-05 ENCOUNTER — Inpatient Hospital Stay (HOSPITAL_COMMUNITY): Payer: Medicaid Other | Admitting: Certified Registered"

## 2013-02-05 ENCOUNTER — Encounter (HOSPITAL_COMMUNITY)
Admission: AD | Disposition: A | Discharge status: Home / Self Care | Payer: Self-pay | Source: Ambulatory Visit | Attending: Family Medicine

## 2013-02-05 ENCOUNTER — Encounter (HOSPITAL_COMMUNITY): Payer: Self-pay | Admitting: Certified Registered"

## 2013-02-05 HISTORY — PX: TUBAL LIGATION: SHX77

## 2013-02-05 LAB — CBC
HCT: 29.1 % — ABNORMAL LOW (ref 36.0–46.0)
MCHC: 34 g/dL (ref 30.0–36.0)
Platelets: 236 10*3/uL (ref 150–400)
RDW: 14.6 % (ref 11.5–15.5)
WBC: 23.2 10*3/uL — ABNORMAL HIGH (ref 4.0–10.5)

## 2013-02-05 LAB — SURGICAL PCR SCREEN: Staphylococcus aureus: NEGATIVE

## 2013-02-05 SURGERY — LIGATION, FALLOPIAN TUBE, POSTPARTUM
Anesthesia: Epidural | Site: Abdomen | Laterality: Bilateral | Wound class: Clean Contaminated

## 2013-02-05 MED ORDER — RHO D IMMUNE GLOBULIN 1500 UNIT/2ML IJ SOLN
300.0000 ug | Freq: Once | INTRAMUSCULAR | Status: AC
  Administered 2013-02-05: 300 ug via INTRAVENOUS
  Filled 2013-02-05: qty 2

## 2013-02-05 MED ORDER — FENTANYL CITRATE 0.05 MG/ML IJ SOLN
INTRAMUSCULAR | Status: AC
  Administered 2013-02-05: 25 ug via INTRAVENOUS
  Filled 2013-02-05: qty 2

## 2013-02-05 MED ORDER — FENTANYL CITRATE 0.05 MG/ML IJ SOLN
25.0000 ug | INTRAMUSCULAR | Status: DC | PRN
  Administered 2013-02-05: 25 ug via INTRAVENOUS

## 2013-02-05 MED ORDER — MIDAZOLAM HCL 2 MG/2ML IJ SOLN
INTRAMUSCULAR | Status: AC
  Filled 2013-02-05: qty 2

## 2013-02-05 MED ORDER — BUPIVACAINE HCL (PF) 0.25 % IJ SOLN
INTRAMUSCULAR | Status: AC
  Filled 2013-02-05: qty 30

## 2013-02-05 MED ORDER — SODIUM BICARBONATE 8.4 % IV SOLN
INTRAVENOUS | Status: AC
  Filled 2013-02-05: qty 50

## 2013-02-05 MED ORDER — OXYTOCIN 10 UNIT/ML IJ SOLN
INTRAMUSCULAR | Status: AC
  Filled 2013-02-05: qty 4

## 2013-02-05 MED ORDER — FENTANYL CITRATE 0.05 MG/ML IJ SOLN
INTRAMUSCULAR | Status: AC
  Filled 2013-02-05: qty 2

## 2013-02-05 MED ORDER — FENTANYL CITRATE 0.05 MG/ML IJ SOLN
INTRAMUSCULAR | Status: DC | PRN
  Administered 2013-02-05 (×2): 100 ug via INTRAVENOUS

## 2013-02-05 MED ORDER — BUPIVACAINE HCL (PF) 0.25 % IJ SOLN
INTRAMUSCULAR | Status: DC | PRN
  Administered 2013-02-05: 10 mL

## 2013-02-05 MED ORDER — MIDAZOLAM HCL 5 MG/5ML IJ SOLN
INTRAMUSCULAR | Status: DC | PRN
  Administered 2013-02-05: 2 mg via INTRAVENOUS

## 2013-02-05 MED ORDER — SODIUM BICARBONATE 8.4 % IV SOLN
INTRAVENOUS | Status: DC | PRN
  Administered 2013-02-05: 5 mL via EPIDURAL
  Administered 2013-02-05: 2 mL via EPIDURAL
  Administered 2013-02-05: 5 mL via EPIDURAL

## 2013-02-05 MED ORDER — LIDOCAINE-EPINEPHRINE (PF) 2 %-1:200000 IJ SOLN
INTRAMUSCULAR | Status: AC
  Filled 2013-02-05: qty 20

## 2013-02-05 MED ORDER — ONDANSETRON HCL 4 MG/2ML IJ SOLN
INTRAMUSCULAR | Status: AC
  Filled 2013-02-05: qty 2

## 2013-02-05 SURGICAL SUPPLY — 26 items
ADH SKN CLS APL DERMABOND .7 (GAUZE/BANDAGES/DRESSINGS) ×2
BLADE SURG 11 STRL SS (BLADE) ×2 IMPLANT
CHLORAPREP W/TINT 26ML (MISCELLANEOUS) ×2 IMPLANT
CLIP FILSHIE TUBAL LIGA STRL (Clip) ×8 IMPLANT
CLOTH BEACON ORANGE TIMEOUT ST (SAFETY) ×2 IMPLANT
DERMABOND ADVANCED (GAUZE/BANDAGES/DRESSINGS) ×2
DERMABOND ADVANCED .7 DNX12 (GAUZE/BANDAGES/DRESSINGS) ×2 IMPLANT
GAUZE SPONGE 4X4 12PLY STRL LF (GAUZE/BANDAGES/DRESSINGS) ×4 IMPLANT
GLOVE BIO SURGEON STRL SZ 6.5 (GLOVE) ×4 IMPLANT
GLOVE BIOGEL PI IND STRL 6.5 (GLOVE) ×1 IMPLANT
GLOVE BIOGEL PI IND STRL 7.0 (GLOVE) ×1 IMPLANT
GLOVE BIOGEL PI INDICATOR 6.5 (GLOVE) ×1
GLOVE BIOGEL PI INDICATOR 7.0 (GLOVE) ×1
GOWN PREVENTION PLUS LG XLONG (DISPOSABLE) ×4 IMPLANT
NEEDLE HYPO 25X1 1.5 SAFETY (NEEDLE) ×2 IMPLANT
NS IRRIG 1000ML POUR BTL (IV SOLUTION) ×2 IMPLANT
PACK ABDOMINAL MINOR (CUSTOM PROCEDURE TRAY) ×2 IMPLANT
SLEEVE SURGEON STRL (DRAPES) ×2 IMPLANT
SPONGE LAP 4X18 X RAY DECT (DISPOSABLE) IMPLANT
SUT VIC AB 0 CT1 27 (SUTURE) ×2
SUT VIC AB 0 CT1 27XBRD ANBCTR (SUTURE) ×1 IMPLANT
SUT VICRYL 4-0 PS2 18IN ABS (SUTURE) ×2 IMPLANT
SYR CONTROL 10ML LL (SYRINGE) ×2 IMPLANT
TOWEL OR 17X24 6PK STRL BLUE (TOWEL DISPOSABLE) ×4 IMPLANT
TRAY FOLEY BAG SILVER LF 14FR (CATHETERS) ×2 IMPLANT
WATER STERILE IRR 1000ML POUR (IV SOLUTION) ×2 IMPLANT

## 2013-02-05 NOTE — Progress Notes (Signed)
CSW attempted to meet with MOB to discuss hx of Anxiety and LPNC, but FOB stated she had gone for a BTL at this time.  He was holding baby skin to skin and states they are doing well.  CSW will attempt again to complete assessment at a later time when MOB is available.

## 2013-02-05 NOTE — Preoperative (Signed)
Beta Blockers   Reason not to administer Beta Blockers:Not Applicable 

## 2013-02-05 NOTE — Anesthesia Preprocedure Evaluation (Signed)

## 2013-02-05 NOTE — Progress Notes (Signed)
Ur chart review completed.  

## 2013-02-05 NOTE — Transfer of Care (Signed)
Immediate Anesthesia Transfer of Care Note  Patient: Theresa Sawyer  Procedure(s) Performed: Procedure(s): POST PARTUM TUBAL LIGATION (Bilateral)  Patient Location: PACU  Anesthesia Type:Epidural  Level of Consciousness: awake, alert  and oriented  Airway & Oxygen Therapy: Patient Spontanous Breathing  Post-op Assessment: Report given to PACU RN, Post -op Vital signs reviewed and stable and Patient moving all extremities  Post vital signs: Reviewed and stable  Complications: No apparent anesthesia complications

## 2013-02-05 NOTE — Anesthesia Postprocedure Evaluation (Signed)
  Anesthesia Post-op Note  Patient: Theresa Sawyer  Procedure(s) Performed: Procedure(s): POST PARTUM TUBAL LIGATION (Bilateral)  Patient Location: PACU  Anesthesia Type:Epidural  Level of Consciousness: awake, alert  and oriented  Airway and Oxygen Therapy: Patient Spontanous Breathing  Post-op Pain: none  Post-op Assessment: Post-op Vital signs reviewed, Patient's Cardiovascular Status Stable, Respiratory Function Stable, Patent Airway, No signs of Nausea or vomiting, Pain level controlled, No headache and No backache  Post-op Vital Signs: Reviewed and stable  Complications: No apparent anesthesia complications

## 2013-02-05 NOTE — Anesthesia Postprocedure Evaluation (Signed)
  Anesthesia Post-op Note  Anesthesia Post Note  Patient: Theresa Sawyer  Procedure(s) Performed: * No procedures listed *  Anesthesia type: Epidural  Patient location: Mother/Baby  Post pain: Pain level controlled  Post assessment: Post-op Vital signs reviewed  Last Vitals:  Filed Vitals:   02/05/13 1248  BP: 110/64  Pulse: 79  Temp: 36.4 C  Resp: 20    Post vital signs: Reviewed  Level of consciousness:alert  Complications: No apparent anesthesia complications

## 2013-02-05 NOTE — Op Note (Signed)
VEE BAHE   02/05/2013   PREOPERATIVE DIAGNOSIS:  Multiparity, undesired fertility  POSTOPERATIVE DIAGNOSIS:  Multiparity, undesired fertility  PROCEDURE:  Postpartum Bilateral Tubal Sterilization using Filshie Clips   SURGEON:  Emeterio Reeve, MD  ASSISTANT:  Martha Clan, MD  ANESTHESIA:  Epidural and local analgesia using 3.15% Marcaine  COMPLICATIONS:  None immediate.  ESTIMATED BLOOD LOSS: 5 ml.  INDICATIONS: 32 y.o. [id number]  with undesired fertility, status post vaginal delivery, desires permanent sterilization.  Other reversible forms of contraception were discussed with patient; she declines all other modalities. Risks of procedure discussed with patient including but not limited to: risk of regret, permanence of method, bleeding, infection, injury to surrounding organs and need for additional procedures.  Failure risk of 0.5-1% with increased risk of ectopic gestation if pregnancy occurs was also discussed with patient.     FINDINGS:  Normal uterus, tubes, and ovaries.  PROCEDURE DETAILS: The patient was taken to the operating room where her epidural anesthesia was dosed up to surgical level and found to be adequate.  She was then placed in a supine position and prepped and draped in the usual sterile fashion.  After an adequate timeout was performed, attention was turned to the patient's abdomen where a small transverse skin incision was made under the umbilical fold. The incision was taken down to the layer of fascia using the scalpel, and fascia was incised, and extended bilaterally. The peritoneum was entered in a sharp fashion. The patient was placed in Trendelenburg.  A moist lap pad was used to move omentum and bowel away until the right fallopian tube was identified and grasped with a Babcock clamp, and followed out to the fimbriated end.  A Filshie clip was placed on the right fallopian tube about 2 cm from the cornu.  A similar process was carried out on the left side  allowing for bilateral tubal sterilization.  Good hemostasis was noted overall.  The instruments were then removed from the patient's abdomen and the fascial incision was repaired with 0 Vicryl, and the skin was closed with a 4-0 Vicryl subcuticular stitch.  10 ml of 0.25% Marcaine was injected into the subcutaneous tissue prior to incision.  The patient tolerated the procedure well.  Sponge, lap, and needle counts were correct times two.  The patient was then taken to the recovery room awake, extubated and in stable condition.  Martha Clan, MD 02/05/2013 1:33 PM

## 2013-02-05 NOTE — Progress Notes (Signed)
Post Partum Day 1  Subjective: cramps, wants to proceed with BTL  Objective: Blood pressure 95/58, pulse 73, temperature 97.9 F (36.6 C), temperature source Oral, resp. rate 18, height 5' 5"  (1.651 m), weight 188 lb (85.276 kg), SpO2 98.00%, unknown if currently breastfeeding.  Physical Exam:  General: alert, cooperative and no distress Lochia: appropriate Uterine Fundus: firm     Recent Labs  02/04/13 0644 02/05/13 0500  HGB 12.4 9.9*  HCT 35.8* 29.1*    Assessment/Plan: Contraception BTL  The procedure and the risk of anesthesia, bleeding, infection, bowel and bladder injury, failure (1/200) and ectopic pregnancy were discussed and her questions were answered. Consent signed, Medicaid consent on chart   LOS: 1 day   ARNOLD,JAMES 02/05/2013, 9:11 AM

## 2013-02-06 ENCOUNTER — Inpatient Hospital Stay (HOSPITAL_COMMUNITY): Admission: RE | Admit: 2013-02-06 | Payer: Medicaid Other | Source: Ambulatory Visit

## 2013-02-06 LAB — RH IG WORKUP (INCLUDES ABO/RH)
ABO/RH(D): O NEG
Antibody Screen: POSITIVE
Unit division: 0

## 2013-02-06 MED ORDER — IBUPROFEN 600 MG PO TABS: 600.0000 mg | ORAL_TABLET | Freq: Four times a day (QID) | ORAL | Status: DC

## 2013-02-06 MED ORDER — OXYCODONE-ACETAMINOPHEN 5-325 MG PO TABS: 1.0000 | ORAL_TABLET | ORAL | Status: DC | PRN

## 2013-02-06 MED ORDER — DOCUSATE SODIUM 100 MG PO CAPS: 100.0000 mg | ORAL_CAPSULE | Freq: Two times a day (BID) | ORAL | Status: DC

## 2013-02-06 NOTE — Clinical Social Work Maternal (Signed)
    Clinical Social Work Department PSYCHOSOCIAL ASSESSMENT - MATERNAL/CHILD 02/06/2013  Patient:  Theresa Sawyer, Theresa Sawyer  Account Number:  [id number]  [place] Date:  02/04/2013  Ardine Eng Name:   Conley Canal    Clinical Social Worker:  Gerri Spore, LCSW   Date/Time:  02/06/2013 12:00 N  Date Referred:  02/06/2013   Referral source  CN     Referred reason  [facility]  Depression/Anxiety   Other referral source:    I:  FAMILY / HOME ENVIRONMENT Child's legal guardian:  PARENT  Guardian - Name Guardian - Age Guardian - Address  Theresa Sawyer [address] [place],  [address]  Bettey Costa, Brooke Bonito. 40    Other household support members/support persons Name Relationship DOB   MOTHER    Other support:   Pt's & FOB's family    II  PSYCHOSOCIAL DATA Information Source:  Patient Interview  Occupational hygienist Employment:   Financial resources:  [employer] [place]:  [place] / Grade:   Maternity Care Coordinator / Child Services Coordination / Early Interventions:  Cultural issues impacting care:    III  STRENGTHS Strengths  Adequate Resources  Home prepared for Child (including basic supplies)  Supportive family/friends   Strength comment:    IV  RISK FACTORS AND CURRENT PROBLEMS Current Problem:  YES   Risk Factor & Current Problem Patient Issue Family Issue Risk Factor / Current Problem Comment  Mental Illness Y N Hx anxiety  Other - See comment Y N [place] @ 31 weeks   N N     V  SOCIAL WORK ASSESSMENT CSW met with pt to assess the reason for [facility] @ 31 weeks & history of anxiety.  When pt received pregnancy confirmation, she thought about terminating.  Pt explained that she & FOB were having relationship issues at the time & wasn't sure if they would still be in a relationship. Once pt decided to have the child, she had to find a doctor that would accept her.  She denies any illegal substance use & verbalized  understanding of hospital drug testing policy.  UDS is negative, meconium results are pending. CSW inquired about pt's feelings towards the baby & she said "I'm so happy I didn't do it.  I feel bad that I even thought about it."  She seems to be bonding well with the baby & happy about becoming a mother.  While pt admits to feeling anxious sometimes, she does not think her symptoms require medication management or counseling services at this time.  FOB is at the bedside & supportive.  Pt has all the necessary supplies for the infant & good family support.  CSW will continue to monitor drug screen results & make a referral if needed.      VI SOCIAL WORK PLAN Social Work Plan  No Further Intervention Required / No Barriers to Discharge   Type of pt/family education:   If child protective services report - county:   If child protective services report - date:   Information/referral to community resources comment:   Other social work plan:

## 2013-02-06 NOTE — Discharge Summary (Signed)
Obstetric Discharge Summary Reason for Admission: onset of labor and rupture of membranes Prenatal Procedures: none Intrapartum Procedures: spontaneous vaginal delivery Postpartum Procedures: P.P. tubal ligation Complications-Operative and Postpartum: 2nd degree perineal laceration  Bottle feeding, BTL for birth control  Hemoglobin  Date Value Range Status  02/05/2013 9.9* 12.0 - 15.0 g/dL Final     DELTA CHECK NOTED     REPEATED TO VERIFY     HCT  Date Value Range Status  02/05/2013 29.1* 36.0 - 46.0 % Final    Physical Exam:  General: alert, cooperative and no distress Lochia: appropriate Uterine Fundus: firm Incision: healing well, no significant drainage, no dehiscence, no significant erythema DVT Evaluation: No evidence of DVT seen on physical exam. No cords or calf tenderness. No significant calf/ankle edema.  Discharge Diagnoses: Term Pregnancy-delivered  Discharge Information: Date: 02/06/2013 Activity: pelvic rest Diet: routine Medications: PNV, Tylenol #3, Colace and Percocet Condition: stable Instructions: refer to practice specific booklet Discharge to: home Follow-up Information   Follow up with [facility] On 03/17/2013. (Keep regularly scheduled follow up.)    Contact information:   [place] [address] [phone]      Newborn Data: Live born female  Birth Weight: 7 lb 10.8 oz (3481 g) APGAR: 8, 9  Home with mother.  [place] Sat 02/06/2013, 8:49 AM  I saw and examined patient and agree with above resident note. I reviewed history, delivery summary, labs and vitals. Martha Clan, MD

## 2013-02-08 ENCOUNTER — Encounter (HOSPITAL_COMMUNITY): Payer: Self-pay | Admitting: *Deleted

## 2013-02-08 ENCOUNTER — Inpatient Hospital Stay (HOSPITAL_COMMUNITY)
Admission: AD | Admit: 2013-02-08 | Discharge: 2013-02-08 | Disposition: A | Discharge status: Home / Self Care | Payer: Medicaid Other | Source: Ambulatory Visit | Attending: Obstetrics & Gynecology | Admitting: Obstetrics & Gynecology

## 2013-02-08 DIAGNOSIS — R109 Unspecified abdominal pain: Secondary | ICD-10-CM | POA: Insufficient documentation

## 2013-02-08 DIAGNOSIS — G8918 Other acute postprocedural pain: Secondary | ICD-10-CM | POA: Insufficient documentation

## 2013-02-08 DIAGNOSIS — O99893 Other specified diseases and conditions complicating puerperium: Secondary | ICD-10-CM | POA: Insufficient documentation

## 2013-02-08 MED ORDER — KETOROLAC TROMETHAMINE 60 MG/2ML IM SOLN
60.0000 mg | Freq: Once | INTRAMUSCULAR | Status: AC
  Administered 2013-02-08: 60 mg via INTRAMUSCULAR
  Filled 2013-02-08: qty 2

## 2013-02-08 NOTE — MAU Provider Note (Signed)
History     CSN: [id number]  Arrival date and time: 02/08/13 4315   First Provider Initiated Contact with Patient 02/08/13 0116      Chief Complaint  Patient presents with  . Leg Swelling  . Abdominal Pain   HPI Theresa Sawyer is a 32yo [id number] s/p vag del x4d and s/p BTL x 3d with complaints of intermittent bilat side abd pain, swelling in bilat LE, and occ pain at upper back.  Is taking Percocet q 4 hrs and Motrin q 6 hours. Back pain/abd pain not occuring currently. Also reports right arm was 'shaking' x few minutes earlier this evening. Denies fever, N/V. Nl lochia.  OB History   Grav Para Term Preterm Abortions TAB SAB Ect Mult Living   3 1 1  0 2 1 1  0 0 1      Past Medical History  Diagnosis Date  . Anxiety     Past Surgical History  Procedure Laterality Date  . No past surgeries    . Tubal ligation Bilateral 02/05/2013    Procedure: POST PARTUM TUBAL LIGATION;  Surgeon: Woodroe Mode, MD;  Location: [facility];  Service: Gynecology;  Laterality: Bilateral;    Family History  Problem Relation Age of Onset  . Diabetes Maternal Grandmother   . Diabetes Paternal Grandmother   . Heart attack Brother     History  Substance Use Topics  . Smoking status: Current Some Day Smoker -- 0.50 packs/day    Types: Cigarettes  . Smokeless tobacco: Not on file  . Alcohol Use: No    Allergies: No Known Allergies  Prescriptions prior to admission  Medication Sig Dispense Refill  . docusate sodium (COLACE) 100 MG capsule Take 1 capsule (100 mg total) by mouth 2 (two) times daily.  10 capsule  0  . ibuprofen (ADVIL,MOTRIN) 600 MG tablet Take 1 tablet (600 mg total) by mouth every 6 (six) hours.  30 tablet  0  . omeprazole (PRILOSEC) 40 MG capsule Take 1 capsule (40 mg total) by mouth daily.  30 capsule  3  . oxyCODONE-acetaminophen (PERCOCET/ROXICET) 5-325 MG per tablet Take 1-2 tablets by mouth every 4 (four) hours as needed.  20 tablet  0  . Pediatric Multiple Vit-C-FA (FLINSTONES  GUMMIES OMEGA-3 DHA) CHEW Chew by mouth. Take 2 daily        ROS Physical Exam   Blood pressure 116/67, pulse 83, temperature 98.2 F (36.8 C), temperature source Oral, resp. rate 18, height 5' 5"  (1.651 m), weight 85.14 kg (187 lb 11.2 oz), SpO2 99.00%, not currently breastfeeding.  Physical Exam  Constitutional: She is oriented to person, place, and time. She appears well-developed.  HENT:  Head: Normocephalic.  Cardiovascular: Normal rate.   Respiratory: Effort normal.  GI:  Umbilical inc with slight eccymosis; skin on lower abd pink beneath inc; no s/s cellulitis; abd sl tender to palp  Musculoskeletal: Normal range of motion.  Neurological: She is alert and oriented to person, place, and time.  Skin: Skin is warm and dry.  Psychiatric: She has a normal mood and affect. Her behavior is normal. Thought content normal.    MAU Course  Procedures    Assessment and Plan  S/p vag del x 4d; s/p BTL x 3d Postop pain  Given Toradol 29m IM Rev'd tips to help surgical air in abd dissipate D/C home with postop precautions of fever/increased pain Reassured re typical LE edema Inbox msg left with [facility] to check in with pt on 8/4  Sharon Rubis 02/08/2013, 1:29 AM

## 2013-02-08 NOTE — MAU Note (Signed)
Vag delivery 7/29. SROM and then had Pitocin. Had [place] 7/30. STates tonight ankles and legs swollen, sometimes feels like fld going thru my whole body and I feel heavy. Sometimes have pain in sides of abdomen.

## 2013-02-08 NOTE — MAU Note (Signed)
PT  SAYS SHE DEL ON Tuesday 7-29- VAG.   AT 9PM- TOOK  PERCOCET  AND IBUPROFEN   FOR PAIN.     INCISION    TO ABD - HAD BTL.-    WNL.

## 2013-02-10 ENCOUNTER — Telehealth: Payer: Self-pay | Admitting: *Deleted

## 2013-02-10 NOTE — Telephone Encounter (Signed)
Pt was seen over the weekend at [facility] and Serita Grammes, CNM asked that I call and check on pt. Spoke with pt and she said she is doing much better. [place]

## 2013-02-12 NOTE — Discharge Summary (Signed)
Attestation of Attending Supervision of Advanced Practitioner: Evaluation and management procedures were performed by the PA/NP/CNM/OB Fellow under my supervision/collaboration. Chart reviewed and agree with management and plan.  Ieshia Hatcher V 02/12/2013 7:01 AM

## 2013-02-17 ENCOUNTER — Telehealth: Payer: Self-pay | Admitting: Obstetrics & Gynecology

## 2013-02-17 NOTE — Telephone Encounter (Signed)
Delivered 2 weeks ago, vaginal delivery. Wants to go back to work this week. Advised would need appt for Dr to release her that soon. Appt scheduled. [place]

## 2013-02-19 ENCOUNTER — Ambulatory Visit (INDEPENDENT_AMBULATORY_CARE_PROVIDER_SITE_OTHER): Payer: Medicaid Other | Admitting: Advanced Practice Midwife

## 2013-02-19 ENCOUNTER — Encounter: Payer: Self-pay | Admitting: Advanced Practice Midwife

## 2013-02-19 VITALS — BP 120/80 | Wt 176.0 lb

## 2013-02-19 DIAGNOSIS — Z9889 Other specified postprocedural states: Secondary | ICD-10-CM

## 2013-02-19 DIAGNOSIS — Z0000 Encounter for general adult medical examination without abnormal findings: Secondary | ICD-10-CM

## 2013-02-19 NOTE — Progress Notes (Signed)
Pt 2 weeks pp uncomplicated vaginal delivery with BTS.  No c/o, feels well.  Needs note to go back to work.  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 Note given.  Has pp appt 03/14/14

## 2013-03-17 ENCOUNTER — Ambulatory Visit: Payer: Medicaid Other | Admitting: Women's Health

## 2013-03-21 ENCOUNTER — Encounter: Payer: Self-pay | Admitting: *Deleted

## 2013-03-24 ENCOUNTER — Ambulatory Visit (INDEPENDENT_AMBULATORY_CARE_PROVIDER_SITE_OTHER): Payer: Medicaid Other | Admitting: Women's Health

## 2013-03-24 ENCOUNTER — Encounter: Payer: Self-pay | Admitting: Women's Health

## 2013-03-24 VITALS — BP 134/80 | Ht 65.0 in | Wt 188.8 lb

## 2013-03-24 DIAGNOSIS — Z3480 Encounter for supervision of other normal pregnancy, unspecified trimester: Secondary | ICD-10-CM

## 2013-03-24 DIAGNOSIS — N760 Acute vaginitis: Secondary | ICD-10-CM

## 2013-03-24 DIAGNOSIS — B9689 Other specified bacterial agents as the cause of diseases classified elsewhere: Secondary | ICD-10-CM

## 2013-03-24 MED ORDER — METRONIDAZOLE 500 MG PO TABS: 500.0000 mg | ORAL_TABLET | Freq: Two times a day (BID) | ORAL | Status: DC

## 2013-03-24 NOTE — Progress Notes (Signed)
Patient ID: Theresa Sawyer, female   DOB: 1980-07-20, 32 y.o.   MRN: [id number] Subjective:    Theresa Sawyer is a 32 y.o. [id number] Caucasian female who presents for a postpartum visit. She is 6 weeks postpartum following a spontaneous vaginal delivery. I have fully reviewed the prenatal and intrapartum course. The delivery was at 40.1 gestational weeks. Outcome: spontaneous vaginal delivery. Anesthesia: epidural. Postpartum course has been uncomplicated. Baby's course has been uncomplicated. Baby is feeding by bottle. Bleeding lochia x 3.5wks, now on menses. Bowel function is normal. Bladder function is normal. Patient is sexually active. Contraception method is tubal ligation. Postpartum depression screening: negative. Reports malodorous d/c on & off, feels like BV is back.  The following portions of the patient's history were reviewed and updated as appropriate: allergies, current medications, past medical history, past surgical history and problem list.  Review of Systems Pertinent items are noted in HPI.   Filed Vitals:   03/24/13 1554  BP: 134/80  Height: 5' 5"  (1.651 m)  Weight: 188 lb 12.8 oz (85.639 kg)    Objective:     General:  alert, cooperative and no distress   Breasts:  deferred, no complaints  Lungs: clear to auscultation bilaterally  Heart:  regular rate and rhythm  Abdomen: soft, nontender   Vulva: normal  Vagina: normal vagina, on menses, malodorous  Cervix:  closed  Corpus: Well-involuted  Adnexa:  Non-palpable  Rectal Exam: No hemorrhoids        Assessment:   Normal postpartum exam 6 wks s/p SVD Depression screening Contraception counseling  BV  Plan:   Contraception: tubal ligation Follow up in: 1 year for physical or as needed.   Rx: metronidazole bid x 7d  Roma Schanz, CNM, [facility] 03/24/2013 4:20 PM

## 2013-03-24 NOTE — Patient Instructions (Signed)
Bacterial Vaginosis Bacterial vaginosis (BV) is a vaginal infection where the normal balance of bacteria in the vagina is disrupted. The normal balance is then replaced by an overgrowth of certain bacteria. There are several different kinds of bacteria that can cause BV. BV is the most common vaginal infection in women of childbearing age. CAUSES   The cause of BV is not fully understood. BV develops when there is an increase or imbalance of harmful bacteria.  Some activities or behaviors can upset the normal balance of bacteria in the vagina and put women at increased risk including:  Having a new sex partner or multiple sex partners.  Douching.  Using an intrauterine device (IUD) for contraception.  It is not clear what role sexual activity plays in the development of BV. However, women that have never had sexual intercourse are rarely infected with BV. Women do not get BV from toilet seats, bedding, swimming pools or from touching objects around them.  SYMPTOMS   Grey vaginal discharge.  A fish-like odor with discharge, especially after sexual intercourse.  Itching or burning of the vagina and vulva.  Burning or pain with urination.  Some women have no signs or symptoms at all. DIAGNOSIS  Your caregiver must examine the vagina for signs of BV. Your caregiver will perform lab tests and look at the sample of vaginal fluid through a microscope. They will look for bacteria and abnormal cells (clue cells), a pH test higher than 4.5, and a positive amine test all associated with BV.  RISKS AND COMPLICATIONS   Pelvic inflammatory disease (PID).  Infections following gynecology surgery.  Developing HIV.  Developing herpes virus. TREATMENT  Sometimes BV will clear up without treatment. However, all women with symptoms of BV should be treated to avoid complications, especially if gynecology surgery is planned. Female partners generally do not need to be treated. However, BV may spread  between female sex partners so treatment is helpful in preventing a recurrence of BV.   BV may be treated with antibiotics. The antibiotics come in either pill or vaginal cream forms. Either can be used with nonpregnant or pregnant women, but the recommended dosages differ. These antibiotics are not harmful to the baby.  BV can recur after treatment. If this happens, a second round of antibiotics will often be prescribed.  Treatment is important for pregnant women. If not treated, BV can cause a premature delivery, especially for a pregnant woman who had a premature birth in the past. All pregnant women who have symptoms of BV should be checked and treated.  For chronic reoccurrence of BV, treatment with a type of prescribed gel vaginally twice a week is helpful. HOME CARE INSTRUCTIONS   Finish all medication as directed by your caregiver.  Do not have sex until treatment is completed.  Tell your sexual partner that you have a vaginal infection. They should see their caregiver and be treated if they have problems, such as a mild rash or itching.  Practice safe sex. Use condoms. Only have 1 sex partner. PREVENTION  Basic prevention steps can help reduce the risk of upsetting the natural balance of bacteria in the vagina and developing BV:  Do not have sexual intercourse (be abstinent).  Do not douche.  Use all of the medicine prescribed for treatment of BV, even if the signs and symptoms go away.  Tell your sex partner if you have BV. That way, they can be treated, if needed, to prevent reoccurrence. SEEK MEDICAL CARE IF:  Your symptoms are not improving after 3 days of treatment.  You have increased discharge, pain, or fever. MAKE SURE YOU:   Understand these instructions.  Will watch your condition.  Will get help right away if you are not doing well or get worse. FOR MORE INFORMATION  Division of STD Prevention (DSTDP), Centers for Disease Control and Prevention:  [employer] [place] (ASHA): www.ashastd.org  Document Released: 06/26/2005 Document Revised: 09/18/2011 Document Reviewed: 12/17/2008 [facility] Patient Information 2014 [place], [place].

## 2014-03-17 ENCOUNTER — Other Ambulatory Visit: Payer: Medicaid Other | Admitting: Obstetrics & Gynecology

## 2014-03-25 ENCOUNTER — Encounter: Payer: Self-pay | Admitting: Obstetrics & Gynecology

## 2014-03-25 ENCOUNTER — Ambulatory Visit (INDEPENDENT_AMBULATORY_CARE_PROVIDER_SITE_OTHER): Payer: BC Managed Care – PPO | Admitting: Obstetrics & Gynecology

## 2014-03-25 ENCOUNTER — Other Ambulatory Visit (HOSPITAL_COMMUNITY)
Admission: RE | Admit: 2014-03-25 | Discharge: 2014-03-25 | Disposition: A | Discharge status: Home / Self Care | Payer: BC Managed Care – PPO | Source: Ambulatory Visit | Attending: Obstetrics & Gynecology | Admitting: Obstetrics & Gynecology

## 2014-03-25 VITALS — BP 110/80 | Ht 65.0 in | Wt 225.0 lb

## 2014-03-25 DIAGNOSIS — Z01419 Encounter for gynecological examination (general) (routine) without abnormal findings: Secondary | ICD-10-CM

## 2014-03-25 DIAGNOSIS — Z1389 Encounter for screening for other disorder: Secondary | ICD-10-CM

## 2014-03-25 LAB — POCT URINALYSIS DIPSTICK
GLUCOSE UA: NEGATIVE
Ketones, UA: NEGATIVE
LEUKOCYTES UA: NEGATIVE
NITRITE UA: NEGATIVE
Protein, UA: NEGATIVE
RBC UA: NEGATIVE

## 2014-03-25 MED ORDER — DESOGESTREL-ETHINYL ESTRADIOL 0.15-30 MG-MCG PO TABS: 1.0000 | ORAL_TABLET | Freq: Every day | ORAL | Status: DC

## 2014-03-25 MED ORDER — ESOMEPRAZOLE MAGNESIUM 40 MG PO CPDR: 40.0000 mg | DELAYED_RELEASE_CAPSULE | Freq: Every day | ORAL | Status: DC

## 2014-03-25 NOTE — Addendum Note (Signed)
Addended by: Florian Buff on: 03/25/2014 04:31 PM   Modules accepted: Orders

## 2014-03-25 NOTE — Progress Notes (Signed)
Patient ID: Theresa Sawyer, female   DOB: 10-26-1980, 33 y.o.   MRN: [id number] Subjective:     Theresa Sawyer is a 33 y.o. female here for a routine exam.  Patient's last menstrual period was 02/22/2014. [id number] Birth Control Method:  BTL Menstrual Calendar(currently): regular  Current complaints: more painful periods.   Current acute medical issues:  psoriasis   Recent Gynecologic History Patient's last menstrual period was 02/22/2014. Last Pap: 2014,  normal Last mammogram: ,    Past Medical History  Diagnosis Date  . Anxiety     Past Surgical History  Procedure Laterality Date  . No past surgeries    . Tubal ligation Bilateral 02/05/2013    Procedure: POST PARTUM TUBAL LIGATION;  Surgeon: Woodroe Mode, MD;  Location: [facility];  Service: Gynecology;  Laterality: Bilateral;    OB History   Grav Para Term Preterm Abortions TAB SAB Ect Mult Living   3 1 1  0 2 1 1  0 0 1      History   Social History  . Marital Status: Legally Separated    Spouse Name: N/A    Number of Children: N/A  . Years of Education: N/A   Social History Main Topics  . Smoking status: Current Some Day Smoker -- 0.50 packs/day    Types: Cigarettes  . Smokeless tobacco: Never Used  . Alcohol Use: No  . Drug Use: No  . Sexual Activity: Yes    Birth Control/ Protection: Surgical   Other Topics Concern  . None   Social History Narrative  . None    Family History  Problem Relation Age of Onset  . Diabetes Maternal Grandmother   . Diabetes Paternal Grandmother   . Heart attack Brother      Review of Systems  Review of Systems  Constitutional: Negative for fever, chills, weight loss, malaise/fatigue and diaphoresis.  HENT: Negative for hearing loss, ear pain, nosebleeds, congestion, sore throat, neck pain, tinnitus and ear discharge.   Eyes: Negative for blurred vision, double vision, photophobia, pain, discharge and redness.  Respiratory: Negative for cough, hemoptysis, sputum production,  shortness of breath, wheezing and stridor.   Cardiovascular: Negative for chest pain, palpitations, orthopnea, claudication, leg swelling and PND.  Gastrointestinal: negative for abdominal pain. Negative for heartburn, nausea, vomiting, diarrhea, constipation, blood in stool and melena.  Genitourinary: Negative for dysuria, urgency, frequency, hematuria and flank pain.  Musculoskeletal: Negative for myalgias, back pain, joint pain and falls.  Skin: Negative for itching and rash.  Neurological: Negative for dizziness, tingling, tremors, sensory change, speech change, focal weakness, seizures, loss of consciousness, weakness and headaches.  Endo/Heme/Allergies: Negative for environmental allergies and polydipsia. Does not bruise/bleed easily.  Psychiatric/Behavioral: Negative for depression, suicidal ideas, hallucinations, memory loss and substance abuse. The patient is not nervous/anxious and does not have insomnia.        Objective:    Physical Exam  Vitals reviewed. Constitutional: She is oriented to person, place, and time. She appears well-developed and well-nourished.  HENT:  Head: Normocephalic and atraumatic.        Right Ear: External ear normal.  Left Ear: External ear normal.  Nose: Nose normal.  Mouth/Throat: Oropharynx is clear and moist.  Eyes: Conjunctivae and EOM are normal. Pupils are equal, round, and reactive to light. Right eye exhibits no discharge. Left eye exhibits no discharge. No scleral icterus.  Neck: Normal range of motion. Neck supple. No tracheal deviation present. No thyromegaly present.  Cardiovascular: Normal rate,  regular rhythm, normal heart sounds and intact distal pulses.  Exam reveals no gallop and no friction rub.   No murmur heard. Respiratory: Effort normal and breath sounds normal. No respiratory distress. She has no wheezes. She has no rales. She exhibits no tenderness.  GI: Soft. Bowel sounds are normal. She exhibits no distension and no mass.  There is no tenderness. There is no rebound and no guarding.  Genitourinary:  Breasts no masses skin changes or nipple changes bilaterally      Vulva is normal without lesions Vagina is pink moist without discharge Cervix normal in appearance and pap is done Uterus is normal size shape and contour Adnexa is negative with normal sized ovaries   Musculoskeletal: Normal range of motion. She exhibits no edema and no tenderness.  Neurological: She is alert and oriented to person, place, and time. She has normal reflexes. She displays normal reflexes. No cranial nerve deficit. She exhibits normal muscle tone. Coordination normal.  Skin: Skin is warm and dry. No rash noted. No erythema. No pallor.  Psychiatric: She has a normal mood and affect. Her behavior is normal. Judgment and thought content normal.       Assessment:    Healthy female exam.    Plan:    Contraception: OCP (estrogen/progesterone). Follow up in: 1 year.

## 2014-03-27 LAB — CYTOLOGY - PAP

## 2014-05-11 ENCOUNTER — Encounter: Payer: Self-pay | Admitting: Obstetrics & Gynecology

## 2015-10-01 ENCOUNTER — Emergency Department (HOSPITAL_COMMUNITY)
Admission: EM | Admit: 2015-10-01 | Discharge: 2015-10-01 | Disposition: A | Discharge status: Home / Self Care | Payer: BLUE CROSS/BLUE SHIELD | Attending: Emergency Medicine | Admitting: Emergency Medicine

## 2015-10-01 ENCOUNTER — Encounter (HOSPITAL_COMMUNITY): Payer: Self-pay | Admitting: Emergency Medicine

## 2015-10-01 DIAGNOSIS — J029 Acute pharyngitis, unspecified: Secondary | ICD-10-CM

## 2015-10-01 DIAGNOSIS — F17210 Nicotine dependence, cigarettes, uncomplicated: Secondary | ICD-10-CM | POA: Insufficient documentation

## 2015-10-01 DIAGNOSIS — H65192 Other acute nonsuppurative otitis media, left ear: Secondary | ICD-10-CM

## 2015-10-01 DIAGNOSIS — H9202 Otalgia, left ear: Secondary | ICD-10-CM | POA: Diagnosis present

## 2015-10-01 MED ORDER — PREDNISONE 10 MG PO TABS
ORAL_TABLET | ORAL | Status: AC
  Filled 2015-10-01: qty 1

## 2015-10-01 MED ORDER — MAGIC MOUTHWASH W/LIDOCAINE: 5.0000 mL | Freq: Three times a day (TID) | ORAL | Status: DC | PRN

## 2015-10-01 MED ORDER — PREDNISONE 50 MG PO TABS
60.0000 mg | ORAL_TABLET | Freq: Once | ORAL | Status: AC
  Administered 2015-10-01: 60 mg via ORAL
  Filled 2015-10-01: qty 1

## 2015-10-01 NOTE — ED Notes (Signed)
Pt c/o sore throat and left ear pain.

## 2015-10-01 NOTE — Discharge Instructions (Signed)

## 2015-10-04 NOTE — ED Provider Notes (Signed)
CSN: [id number]     Arrival date & time 10/01/15  2126 History   First MD Initiated Contact with Patient 10/01/15 2151     Chief Complaint  Patient presents with  . Sore Throat     (Consider location/radiation/quality/duration/timing/severity/associated sxs/prior Treatment) HPI  Theresa Sawyer is a 35 y.o. female who presents to the Emergency Department complaining of sore throat and left ear pain that began one day prior.  She reports pain to her throat with swallowing and dull pain to the left ear.  She has not taken any medication for symptom relief.   She denies fever, chills, neck pain, cough or shortness of breath   Past Medical History  Diagnosis Date  . Anxiety    Past Surgical History  Procedure Laterality Date  . No past surgeries    . Tubal ligation Bilateral 02/05/2013    Procedure: POST PARTUM TUBAL LIGATION;  Surgeon: Woodroe Mode, MD;  Location: [facility];  Service: Gynecology;  Laterality: Bilateral;   Family History  Problem Relation Age of Onset  . Diabetes Maternal Grandmother   . Diabetes Paternal Grandmother   . Heart attack Brother    Social History  Substance Use Topics  . Smoking status: Current Some Day Smoker -- 0.50 packs/day    Types: Cigarettes  . Smokeless tobacco: Never Used  . Alcohol Use: No   OB History    Gravida Para Term Preterm AB TAB SAB Ectopic Multiple Living   3 1 1  0 2 1 1  0 0 1     Review of Systems  Constitutional: Negative for fever, chills, activity change and appetite change.  HENT: Positive for congestion, ear pain and sore throat. Negative for facial swelling, rhinorrhea and trouble swallowing.   Eyes: Negative for visual disturbance.  Respiratory: Negative for cough, shortness of breath and wheezing.   Gastrointestinal: Negative for nausea and vomiting.  Musculoskeletal: Negative for neck pain and neck stiffness.  Skin: Negative.  Negative for rash.  Neurological: Negative for dizziness, weakness, numbness and  headaches.  Hematological: Negative for adenopathy.  Psychiatric/Behavioral: Negative for confusion.  All other systems reviewed and are negative.     Allergies  Review of patient's allergies indicates no known allergies.  Home Medications   Prior to Admission medications   Medication Sig Start Date End Date Taking? Authorizing Provider  desogestrel-ethinyl estradiol (APRI,EMOQUETTE,SOLIA) 0.15-30 MG-MCG tablet Take 1 tablet by mouth daily. 03/25/14   Florian Buff, MD  docusate sodium (COLACE) 100 MG capsule Take 1 capsule (100 mg total) by mouth 2 (two) times daily. 02/06/13   Leone Brand, MD  esomeprazole (NEXIUM) 40 MG capsule Take 1 capsule (40 mg total) by mouth daily at 12 noon. 03/25/14   Florian Buff, MD  ibuprofen (ADVIL,MOTRIN) 600 MG tablet Take 1 tablet (600 mg total) by mouth every 6 (six) hours. 02/06/13   Leone Brand, MD  magic mouthwash w/lidocaine SOLN Take 5 mLs by mouth 3 (three) times daily as needed for mouth pain. Swish and spit, do not swallow 10/01/15   Blake Vetrano, PA-C  metroNIDAZOLE (FLAGYL) 500 MG tablet Take 1 tablet (500 mg total) by mouth 2 (two) times daily. X 7 days 03/24/13   Roma Schanz, CNM  Multiple Vitamins-Minerals (MULTIVITAMIN WITH MINERALS) tablet Take 1 tablet by mouth daily.    Historical Provider, MD  omeprazole (PRILOSEC) 40 MG capsule Take 1 capsule (40 mg total) by mouth daily. 01/29/13   Jonnie Kind, MD  oxyCODONE-acetaminophen (PERCOCET/ROXICET) 5-325 MG per tablet Take 1-2 tablets by mouth every 4 (four) hours as needed. 02/06/13   Leone Brand, MD  Pediatric Multiple Vit-C-FA (FLINSTONES GUMMIES OMEGA-3 Buras) CHEW Chew by mouth. Take 2 daily    Historical Provider, MD   BP 101/71 mmHg  Pulse 103  Temp(Src) 98.2 F (36.8 C)  Resp 18  Ht 5' 5"  (1.651 m)  Wt 90.719 kg  BMI 33.28 kg/m2  SpO2 99%  LMP 09/23/2015 Physical Exam  Constitutional: She is oriented to person, place, and time. She appears well-developed and  well-nourished. No distress.  HENT:  Head: Normocephalic and atraumatic.  Right Ear: Tympanic membrane and ear canal normal.  Left Ear: Tympanic membrane and ear canal normal.  Nose: Mucosal edema and rhinorrhea present.  Mouth/Throat: Uvula is midline and mucous membranes are normal. No trismus in the jaw. No uvula swelling. Posterior oropharyngeal erythema present. No oropharyngeal exudate, posterior oropharyngeal edema or tonsillar abscesses.  Mild erythema of the left TM. No bulging. landmarks remain visible  Eyes: Conjunctivae are normal.  Neck: Normal range of motion and phonation normal. Neck supple. No Brudzinski's sign and no Kernig's sign noted.  Cardiovascular: Normal rate, regular rhythm and intact distal pulses.   Pulmonary/Chest: Effort normal and breath sounds normal. No respiratory distress. She has no wheezes. She has no rales.  Abdominal: Soft. She exhibits no distension. There is no tenderness. There is no rebound and no guarding.  Musculoskeletal: She exhibits no edema.  Lymphadenopathy:    She has no cervical adenopathy.  Neurological: She is alert and oriented to person, place, and time. She exhibits normal muscle tone. Coordination normal.  Skin: Skin is warm and dry.  Nursing note and vitals reviewed.   ED Course  Procedures (including critical care time) Labs Review Labs Reviewed  RAPID STREP SCREEN (NOT AT [facility])    Imaging Review No results found. I have personally reviewed and evaluated these images and lab results as part of my medical decision-making.   EKG Interpretation None      MDM   Final diagnoses:  Acute nonsuppurative otitis media of left ear  Pharyngitis    Pt is non-toxic appearing.  Afebrile.  Likely viral.  Pt agrees to Rx for magic mouthwash.  She agrees to close PMD f/u if needed.  Appears stable for d/c    Kem Parkinson, PA-C 10/04/15 2001  Nat Christen, MD 10/06/15 [phone]

## 2016-04-21 DIAGNOSIS — Z7689 Persons encountering health services in other specified circumstances: Secondary | ICD-10-CM | POA: Diagnosis not present

## 2016-04-21 DIAGNOSIS — J0100 Acute maxillary sinusitis, unspecified: Secondary | ICD-10-CM | POA: Diagnosis not present

## 2016-09-07 DIAGNOSIS — Z79899 Other long term (current) drug therapy: Secondary | ICD-10-CM | POA: Diagnosis not present

## 2016-09-07 DIAGNOSIS — L400 Psoriasis vulgaris: Secondary | ICD-10-CM | POA: Diagnosis not present

## 2016-09-21 DIAGNOSIS — L400 Psoriasis vulgaris: Secondary | ICD-10-CM | POA: Diagnosis not present

## 2016-09-25 DIAGNOSIS — L400 Psoriasis vulgaris: Secondary | ICD-10-CM | POA: Diagnosis not present

## 2016-12-14 DIAGNOSIS — J0100 Acute maxillary sinusitis, unspecified: Secondary | ICD-10-CM | POA: Diagnosis not present

## 2017-04-16 DIAGNOSIS — N390 Urinary tract infection, site not specified: Secondary | ICD-10-CM | POA: Diagnosis not present

## 2017-07-29 DIAGNOSIS — R300 Dysuria: Secondary | ICD-10-CM | POA: Diagnosis not present

## 2017-07-29 DIAGNOSIS — N390 Urinary tract infection, site not specified: Secondary | ICD-10-CM | POA: Diagnosis not present

## 2017-09-09 DIAGNOSIS — J00 Acute nasopharyngitis [common cold]: Secondary | ICD-10-CM | POA: Diagnosis not present

## 2017-09-09 DIAGNOSIS — J029 Acute pharyngitis, unspecified: Secondary | ICD-10-CM | POA: Diagnosis not present

## 2017-11-22 DIAGNOSIS — L400 Psoriasis vulgaris: Secondary | ICD-10-CM | POA: Diagnosis not present

## 2018-01-17 DIAGNOSIS — R0789 Other chest pain: Secondary | ICD-10-CM | POA: Diagnosis not present

## 2018-02-18 ENCOUNTER — Encounter (HOSPITAL_COMMUNITY): Payer: Self-pay

## 2018-02-18 ENCOUNTER — Emergency Department (HOSPITAL_COMMUNITY): Payer: BLUE CROSS/BLUE SHIELD

## 2018-02-18 ENCOUNTER — Emergency Department (HOSPITAL_COMMUNITY)
Admission: EM | Admit: 2018-02-18 | Discharge: 2018-02-18 | Disposition: A | Discharge status: Home / Self Care | Payer: BLUE CROSS/BLUE SHIELD | Attending: Emergency Medicine | Admitting: Emergency Medicine

## 2018-02-18 ENCOUNTER — Other Ambulatory Visit: Payer: Self-pay

## 2018-02-18 DIAGNOSIS — R079 Chest pain, unspecified: Secondary | ICD-10-CM

## 2018-02-18 DIAGNOSIS — R0689 Other abnormalities of breathing: Secondary | ICD-10-CM | POA: Diagnosis not present

## 2018-02-18 DIAGNOSIS — R000 Tachycardia, unspecified: Secondary | ICD-10-CM | POA: Diagnosis not present

## 2018-02-18 DIAGNOSIS — I10 Essential (primary) hypertension: Secondary | ICD-10-CM | POA: Diagnosis not present

## 2018-02-18 DIAGNOSIS — R070 Pain in throat: Secondary | ICD-10-CM | POA: Diagnosis not present

## 2018-02-18 DIAGNOSIS — F17210 Nicotine dependence, cigarettes, uncomplicated: Secondary | ICD-10-CM | POA: Diagnosis not present

## 2018-02-18 DIAGNOSIS — R0789 Other chest pain: Secondary | ICD-10-CM | POA: Diagnosis not present

## 2018-02-18 LAB — BASIC METABOLIC PANEL
ANION GAP: 9 (ref 5–15)
BUN: 12 mg/dL (ref 6–20)
CHLORIDE: 107 mmol/L (ref 98–111)
CO2: 22 mmol/L (ref 22–32)
Calcium: 9 mg/dL (ref 8.9–10.3)
Creatinine, Ser: 0.75 mg/dL (ref 0.44–1.00)
GFR calc Af Amer: >60 mL/min (ref >60)
GFR calc non Af Amer: >60 mL/min (ref >60)
Glucose, Bld: 110 mg/dL — ABNORMAL HIGH (ref 70–99)
POTASSIUM: 3.6 mmol/L (ref 3.5–5.1)
Sodium: 138 mmol/L (ref 135–145)

## 2018-02-18 LAB — I-STAT TROPONIN, ED
TROPONIN I, POC: 0 ng/mL (ref 0.00–0.08)
Troponin i, poc: 0 ng/mL (ref 0.00–0.08)

## 2018-02-18 LAB — CBC
HEMATOCRIT: 40.6 % (ref 36.0–46.0)
HEMOGLOBIN: 13.5 g/dL (ref 12.0–15.0)
MCH: 28.8 pg (ref 26.0–34.0)
MCHC: 33.3 g/dL (ref 30.0–36.0)
MCV: 86.8 fL (ref 78.0–100.0)
Platelets: 365 10*3/uL (ref 150–400)
RBC: 4.68 MIL/uL (ref 3.87–5.11)
RDW: 13.5 % (ref 11.5–15.5)
WBC: 11.1 10*3/uL — ABNORMAL HIGH (ref 4.0–10.5)

## 2018-02-18 LAB — I-STAT BETA HCG BLOOD, ED (MC, WL, AP ONLY): I-stat hCG, quantitative: <5 m[IU]/mL (ref <5)

## 2018-02-18 MED ORDER — IBUPROFEN 600 MG PO TABS: 600.0000 mg | ORAL_TABLET | Freq: Three times a day (TID) | ORAL | 0 refills | Status: DC | PRN

## 2018-02-18 MED ORDER — NITROGLYCERIN IN D5W 200-5 MCG/ML-% IV SOLN
INTRAVENOUS | Status: AC
  Filled 2018-02-18: qty 250

## 2018-02-18 MED ORDER — GI COCKTAIL ~~LOC~~
30.0000 mL | Freq: Once | ORAL | Status: AC
  Administered 2018-02-18: 30 mL via ORAL
  Filled 2018-02-18: qty 30

## 2018-02-18 MED ORDER — MORPHINE SULFATE (PF) 4 MG/ML IV SOLN
4.0000 mg | Freq: Once | INTRAVENOUS | Status: AC
  Administered 2018-02-18: 4 mg via INTRAVENOUS
  Filled 2018-02-18: qty 1

## 2018-02-18 MED ORDER — FAMOTIDINE 20 MG PO TABS: 20.0000 mg | ORAL_TABLET | Freq: Two times a day (BID) | ORAL | 0 refills | Status: DC

## 2018-02-18 MED ORDER — KETOROLAC TROMETHAMINE 30 MG/ML IJ SOLN
30.0000 mg | Freq: Once | INTRAMUSCULAR | Status: AC
  Administered 2018-02-18: 30 mg via INTRAVENOUS
  Filled 2018-02-18: qty 1

## 2018-02-18 NOTE — ED Provider Notes (Signed)
[facility] EMERGENCY DEPARTMENT Provider Note   CSN: [id number] Arrival date & time: 02/18/18  1128     History   Chief Complaint Chief Complaint  Patient presents with  . Chest Pain    HPI Theresa Sawyer is a 37 y.o. female.  The history is provided by the patient.  Chest Pain   This is a new problem. Episode onset: It started a couple of weeks ago.  It was constant for two days and then went away.  Today it returned. The problem occurs constantly. Associated with: nothing in particular. The pain is present in the lateral region (left). The pain is moderate. The quality of the pain is described as burning and pressure-like. The pain radiates to the left shoulder. Duration of episode(s) is 6 hours. Pertinent negatives include no cough, no nausea, no shortness of breath and no vomiting. Treatments tried: asa. The treatment provided mild relief. Risk factors include smoking/tobacco exposure.  Pertinent negatives for past medical history include no CAD, no CHF, no diabetes, no hyperlipidemia, no hypertension, no MI and no PE.  Her family medical history is significant for heart disease ( brother died of mi in his 28s).   The sx started off and on a couple of weeks ago. Past Medical History:  Diagnosis Date  . Anxiety     Patient Active Problem List   Diagnosis Date Noted  . SVD (spontaneous vaginal delivery) 02/06/2013  . Sterilization consult 01/29/2013  . Rh negative status during pregnancy 12/21/2012  . Supervision of other normal pregnancy 12/16/2012  . Late prenatal care complicating pregnancy in third trimester 12/16/2012    Past Surgical History:  Procedure Laterality Date  . NO PAST SURGERIES    . TUBAL LIGATION Bilateral 02/05/2013   Procedure: POST PARTUM TUBAL LIGATION;  Surgeon: Woodroe Mode, MD;  Location: [facility];  Service: Gynecology;  Laterality: Bilateral;     OB History    Gravida  3   Para  1   Term  1   Preterm  0   AB  2   Living  1     SAB  1   TAB  1   Ectopic  0   Multiple  0   Live Births  1            Home Medications    Prior to Admission medications   Medication Sig Start Date End Date Taking? Authorizing Provider  diphenhydrAMINE (BENADRYL) 25 MG tablet Take 25 mg by mouth every 6 (six) hours as needed for allergies.   Yes [provider]  ranitidine (ZANTAC) 150 MG tablet Take 150 mg by mouth daily as needed for heartburn.   Yes [provider]  augmented betamethasone dipropionate (DIPROLENE-AF) 0.05 % cream Apply 1 application topically daily as needed.  12/13/17   [provider]  famotidine (PEPCID) 20 MG tablet Take 1 tablet (20 mg total) by mouth 2 (two) times daily. 02/18/18   Dorie Rank, MD  ibuprofen (ADVIL,MOTRIN) 600 MG tablet Take 1 tablet (600 mg total) by mouth every 8 (eight) hours as needed for up to 21 doses. 02/18/18   Dorie Rank, MD  Multiple Vitamins-Minerals (MULTIVITAMIN WITH MINERALS) tablet Take 1 tablet by mouth daily.    [provider]    Family History Family History  Problem Relation Age of Onset  . Diabetes Maternal Grandmother   . Diabetes Paternal Grandmother   . Heart attack Brother     Social History Social  History   Tobacco Use  . Smoking status: Current Some Day Smoker    Packs/day: 0.50    Types: Cigarettes  . Smokeless tobacco: Never Used  Substance Use Topics  . Alcohol use: No  . Drug use: No     Allergies   Patient has no known allergies.   Review of Systems Review of Systems  Respiratory: Negative for cough and shortness of breath.   Cardiovascular: Positive for chest pain.  Gastrointestinal: Negative for nausea and vomiting.  All other systems reviewed and are negative.    Physical Exam Updated Vital Signs BP (!) 134/92 (BP Location: Right Arm)   Pulse 86   Temp 98.5 F (36.9 C) (Oral)   Resp 16   Ht 1.651 m (5' 5" )   Wt 93 kg   LMP 02/15/2018   SpO2 100%   BMI 34.11 kg/m   Physical Exam    Constitutional: She appears well-developed and well-nourished. No distress.  HENT:  Head: Normocephalic and atraumatic.  Right Ear: External ear normal.  Left Ear: External ear normal.  Eyes: Conjunctivae are normal. Right eye exhibits no discharge. Left eye exhibits no discharge. No scleral icterus.  Neck: Neck supple. No tracheal deviation present.  Cardiovascular: Normal rate, regular rhythm and intact distal pulses.  Pulmonary/Chest: Effort normal and breath sounds normal. No stridor. No respiratory distress. She has no wheezes. She has no rales.  Abdominal: Soft. Bowel sounds are normal. She exhibits no distension. There is no tenderness. There is no rebound and no guarding.  Musculoskeletal: She exhibits no edema or tenderness.  Neurological: She is alert. She has normal strength. No cranial nerve deficit (no facial droop, extraocular movements intact, no slurred speech) or sensory deficit. She exhibits normal muscle tone. She displays no seizure activity. Coordination normal.  Skin: Skin is warm and dry. No rash noted.  Psychiatric: She has a normal mood and affect.  Nursing note and vitals reviewed.    ED Treatments / Results  Labs (all labs ordered are listed, but only abnormal results are displayed) Labs Reviewed  BASIC METABOLIC PANEL - Abnormal; Notable for the following components:      Result Value   Glucose, Bld 110 (*)    All other components within normal limits  CBC - Abnormal; Notable for the following components:   WBC 11.1 (*)    All other components within normal limits  I-STAT TROPONIN, ED  I-STAT BETA HCG BLOOD, ED ([facility], [facility], [facility] ONLY)  I-STAT TROPONIN, ED    EKG EKG Interpretation  Date/Time:  Monday February 18 2018 11:33:44 EDT Ventricular Rate:  90 PR Interval:    QRS Duration: 92 QT Interval:  365 QTC Calculation: 445 R Axis:   46 Text Interpretation:  Sinus rhythm No significant change was found Confirmed by Dorie Rank [id number]) on 02/18/2018  12:23:17 PM   Radiology Dg Chest 2 View  Result Date: 02/18/2018 CLINICAL DATA:  Sore throat and left ear pain. EXAM: CHEST - 2 VIEW COMPARISON:  None. FINDINGS: The heart size and mediastinal contours are within normal limits. Both lungs are clear. The visualized skeletal structures are unremarkable. IMPRESSION: No active cardiopulmonary disease. Electronically Signed   By: Abelardo Diesel M.D.   On: 02/18/2018 12:20    Procedures Procedures (including critical care time)  Medications Ordered in ED Medications  ketorolac (TORADOL) 30 MG/ML injection 30 mg (30 mg Intravenous Given 02/18/18 1243)  gi cocktail (Maalox,Lidocaine,Donnatal) (30 mLs Oral Given 02/18/18 1243)  morphine 4 MG/ML injection  4 mg (4 mg Intravenous Given 02/18/18 1432)     Initial Impression / Assessment and Plan / ED Course  I have reviewed the triage vital signs and the nursing notes.  Pertinent labs & imaging results that were available during my care of the patient were reviewed by me and considered in my medical decision making (see chart for details).  Clinical Course as of Feb 19 1527  Mon Feb 18, 2018  1523 PERC negative.  Heart score 2, low risk   [JK]    Clinical Course User Index [JK] Dorie Rank, MD    Patient presented to the emergency room for evaluation of chest pain.  At times symptoms have been sharp.  At other times the pressure discomfort.  Patient has had several hours of constant pain at this point with negative serial troponins.  X-ray does not show pneumonia.  Doubt pulmonary embolism, aortic dissection or other emergent etiology.  At this time there does not appear to be any evidence of an acute emergency medical condition and the patient appears stable for discharge with appropriate outpatient follow up.   Will try nsaids and antacids.  Follow up with PCP  Final Clinical Impressions(s) / ED Diagnoses   Final diagnoses:  Chest pain, unspecified type    ED Discharge Orders          Ordered    famotidine (PEPCID) 20 MG tablet  2 times daily     02/18/18 1527    ibuprofen (ADVIL,MOTRIN) 600 MG tablet  Every 8 hours PRN     02/18/18 1527           Dorie Rank, MD 02/18/18 1528

## 2018-02-18 NOTE — ED Triage Notes (Signed)
PT arrived via ems with c/o left sided chest pain.  Reports started this morning around 0430.  Pt took 2 full strength asprin early this morning.  Pt then took [facility] powder.  EMS gave 1 nitro but no change in pain  Pt describes pain as pressure.

## 2018-02-18 NOTE — Discharge Instructions (Signed)
Take the medications as prescribed, follow-up with your primary care doctor later this week or early next week for recheck, return to the emergent as needed for worsening symptoms

## 2018-02-19 ENCOUNTER — Ambulatory Visit: Payer: BLUE CROSS/BLUE SHIELD | Admitting: Physician Assistant

## 2018-02-19 ENCOUNTER — Encounter: Payer: Self-pay | Admitting: Physician Assistant

## 2018-02-19 VITALS — BP 130/87 | HR 83 | Temp 98.3°F | Resp 16 | Ht 65.0 in | Wt 217.4 lb

## 2018-02-19 DIAGNOSIS — R0789 Other chest pain: Secondary | ICD-10-CM | POA: Diagnosis not present

## 2018-02-19 DIAGNOSIS — Z130 Encounter for screening for diseases of the blood and blood-forming organs and certain disorders involving the immune mechanism: Secondary | ICD-10-CM

## 2018-02-19 DIAGNOSIS — Z1329 Encounter for screening for other suspected endocrine disorder: Secondary | ICD-10-CM | POA: Diagnosis not present

## 2018-02-19 DIAGNOSIS — F411 Generalized anxiety disorder: Secondary | ICD-10-CM

## 2018-02-19 DIAGNOSIS — Z13228 Encounter for screening for other metabolic disorders: Secondary | ICD-10-CM | POA: Diagnosis not present

## 2018-02-19 DIAGNOSIS — R0683 Snoring: Secondary | ICD-10-CM | POA: Diagnosis not present

## 2018-02-19 MED ORDER — ESCITALOPRAM OXALATE 5 MG PO TABS: 5.0000 mg | ORAL_TABLET | Freq: Every day | ORAL | 3 refills | Status: DC

## 2018-02-19 NOTE — Progress Notes (Signed)
Theresa Sawyer  MRN: [id number] DOB: 20-Jan-1981  PCP: Patient, [name]  Subjective:  Pt is a right handed 37 year old female who presents to clinic for f/u chest pain.  Went to ED yesterday with chest pain. Negative chest x-ray and EKG and treated for other chest pain with NSAID and antacid.   Yesterday she woke up and my chest felt tight.  Discomfort on her left side and center of her chest.  She works as a Educational psychologist.   Snoring. Sometimes.   Smoker: 1/2 pack day x 20 years. She is trying to quit.  She does not exercise.  No alcohol. No increased life stressors.  Anxiety and depression runs in her family.   family history includes Diabetes in her maternal grandmother and paternal grandmother; Heart attack in her brother.  Review of Systems  Constitutional: Negative for diaphoresis and fatigue.  Cardiovascular: Positive for chest pain. Negative for palpitations.  Musculoskeletal: Negative for arthralgias and neck pain.  Psychiatric/Behavioral: Positive for dysphoric mood. The patient is nervous/anxious.     Patient Active Problem List   Diagnosis Date Noted  . SVD (spontaneous vaginal delivery) 02/06/2013  . Sterilization consult 01/29/2013  . Rh negative status during pregnancy 12/21/2012  . Supervision of other normal pregnancy 12/16/2012  . Late prenatal care complicating pregnancy in third trimester 12/16/2012    Current Outpatient Medications on File Prior to Visit  Medication Sig Dispense Refill  . augmented betamethasone dipropionate (DIPROLENE-AF) 0.05 % cream Apply 1 application topically daily as needed.   2  . diphenhydrAMINE (BENADRYL) 25 MG tablet Take 25 mg by mouth every 6 (six) hours as needed for allergies.    . famotidine (PEPCID) 20 MG tablet Take 1 tablet (20 mg total) by mouth 2 (two) times daily. 14 tablet 0  . ibuprofen (ADVIL,MOTRIN) 600 MG tablet Take 1 tablet (600 mg total) by mouth every 8 (eight) hours as needed for up to 21 doses. 21 tablet  0  . Multiple Vitamins-Minerals (MULTIVITAMIN WITH MINERALS) tablet Take 1 tablet by mouth daily.    . ranitidine (ZANTAC) 150 MG tablet Take 150 mg by mouth daily as needed for heartburn.     No current facility-administered medications on file prior to visit.     No Known Allergies   Objective:  BP 130/87   Pulse 83   Temp 98.3 F (36.8 C)   Resp 16   Ht 5' 5"  (1.651 m)   Wt 217 lb 6.4 oz (98.6 kg)   LMP 02/15/2018   SpO2 100%   BMI 36.18 kg/m   Physical Exam  Constitutional: She is oriented to person, place, and time. No distress.  Cardiovascular: Normal rate, regular rhythm and normal heart sounds.  Neurological: She is alert and oriented to person, place, and time.  Skin: Skin is warm and dry.  Psychiatric: Judgment normal.  Vitals reviewed.   Assessment and Plan :  1. Atypical chest pain - Pt presents for f/u chest pain - she was seen in the ED yesterday with chest pain. Negative chest x-ray and EKG and treated for other chest pain with NSAID and antacid. After long discussion with pt, suspect possible anxiety-related chest pain. Will refer to cards for eval. Lipid panel is pending.  - Ambulatory referral to Cardiology  2. Snoring - Ambulatory referral to Sleep Studies  3. Generalized anxiety disorder - Denies SI or HI. Anxiety and depression run in her family. Start Lexapro 59m with instruction to titrate to  58m after 3 weeks if needed. RTC in 4-6 weeks to check in.  - escitalopram (LEXAPRO) 5 MG tablet; Take 1 tablet (5 mg total) by mouth daily. May increase to 184mafter 3 weeks if needed  Dispense: 30 tablet; Refill: 3  4. Screening for endocrine, metabolic, and immunity disorder - Lipid panel - Hemoglobin A1c   WhMercer PodPA-C  [facility] [place]/13/2019 3:12 PM  Please note: Portions of this report may have been transcribed using dragon voice recognition software. Every effort was made to ensure accuracy; however,  inadvertent computerized transcription errors may be present.

## 2018-02-19 NOTE — Patient Instructions (Addendum)
Start taking Lexapro 5 mg in the morning.  If you are not feeling improvements after 3 weeks you can take 10 mg daily.  This is a medication you need to take every day and do not miss a dose. Consider practicing mindfulness meditation or other relaxation techniques such as deep breathing, prayer, yoga, tai chi, massage. See website mindful.org or the apps Headspace or Calm to help get started.  Come back and see me in 4-6 weeks to follow-up   You will receive a phone call to schedule an appointment with cardiology and sleep studies.   ACTIVITY/FITNESS Mental, social, emotional and physical stimulation are very important for brain and body health. Try learning a new activity (arts, music, language, sports, games).  Keep moving your body to the best of your abilities. You can do this at home, inside or outside, the park, community center, gym or anywhere you like. Consider a physical therapist or personal trainer to get started. Consider the app Sworkit. Fitness trackers such as smart-watches, smart-phones or Fitbits can help as well.   NUTRITION Eat more plants: colorful vegetables, nuts, seeds and berries. Eat less sugar, salt, preservatives and processed foods. Avoid toxins such as cigarettes and alcohol. Drink water when you are thirsty. Warm water with a slice of lemon is an excellent morning drink to start the day.  Consider these websites for more information The Nutrition Source (https://www.henry-hernandez.biz/) Precision Nutrition (WindowBlog.ch)    Heart-Healthy Eating Plan Many factors influence your heart health, including eating and exercise habits. Heart (coronary) risk increases with abnormal blood fat (lipid) levels. Heart-healthy meal planning includes limiting unhealthy fats, increasing healthy fats, and making other small dietary changes. This includes maintaining a healthy body weight to help keep lipid levels within a  normal range.  What types of fat should I choose?  Choose healthy fats more often. Choose monounsaturated and polyunsaturated fats, such as olive oil and canola oil, flaxseeds, walnuts, almonds, and seeds.  Eat more omega-3 fats. Good choices include salmon, mackerel, sardines, tuna, flaxseed oil, and ground flaxseeds. Aim to eat fish at least two times each week.  Limit saturated fats. Saturated fats are primarily found in animal products, such as meats, butter, and cream. Plant sources of saturated fats include palm oil, palm kernel oil, and coconut oil.  Avoid foods with partially hydrogenated oils in them. These contain trans fats. Examples of foods that contain trans fats are stick margarine, some tub margarines, cookies, crackers, and other baked goods. What general guidelines do I need to follow?  Check food labels carefully to identify foods with trans fats or high amounts of saturated fat.  Fill one half of your plate with vegetables and green salads. Eat 4-5 servings of vegetables per day. A serving of vegetables equals 1 cup of raw leafy vegetables,  cup of raw or cooked cut-up vegetables, or  cup of vegetable juice.  Fill one fourth of your plate with whole grains. Look for the word "whole" as the first word in the ingredient list.  Fill one fourth of your plate with lean protein foods.  Eat 4-5 servings of fruit per day. A serving of fruit equals one medium whole fruit,  cup of dried fruit,  cup of fresh, frozen, or canned fruit, or  cup of 100% fruit juice.  Eat more foods that contain soluble fiber. Examples of foods that contain this type of fiber are apples, broccoli, carrots, beans, peas, and barley. Aim to get 20-30 g of fiber per day.  Eat more home-cooked food and less restaurant, buffet, and fast food.  Limit or avoid alcohol.  Limit foods that are high in starch and sugar.  Avoid fried foods.  Cook foods by using methods other than frying. Baking, boiling,  grilling, and broiling are all great options. Other fat-reducing suggestions include: ? Removing the skin from poultry. ? Removing all visible fats from meats. ? Skimming the fat off of stews, soups, and gravies before serving them. ? Steaming vegetables in water or broth.  Lose weight if you are overweight. Losing just 5-10% of your initial body weight can help your overall health and prevent diseases such as diabetes and heart disease.  Increase your consumption of nuts, legumes, and seeds to 4-5 servings per week. One serving of dried beans or legumes equals  cup after being cooked, one serving of nuts equals 1 ounces, and one serving of seeds equals  ounce or 1 tablespoon.  You may need to monitor your salt (sodium) intake, especially if you have high blood pressure. Talk with your health care provider or dietitian to get more information about reducing sodium. What foods can I eat? Grains  Breads, including [place], white, pita, wheat, raisin, rye, oatmeal, and [place]. Tortillas that are neither fried nor made with lard or trans fat. Low-fat rolls, including hotdog and hamburger buns and English muffins. Biscuits. Muffins. Waffles. Pancakes. Light popcorn. Whole-grain cereals. Flatbread. Melba toast. Pretzels. Breadsticks. Rusks. Low-fat snacks and crackers, including oyster, saltine, matzo, graham, animal, and rye. Rice and pasta, including brown rice and those that are made with whole wheat. Vegetables All vegetables. Fruits All fruits, but limit coconut. Meats and Other Protein Sources Lean, well-trimmed beef, veal, pork, and lamb. Chicken and [place] without skin. All fish and shellfish. Wild duck, rabbit, pheasant, and venison. Egg whites or low-cholesterol egg substitutes. Dried beans, peas, lentils, and tofu.Seeds and most nuts. Dairy Low-fat or nonfat cheeses, including ricotta, string, and mozzarella. Skim or 1% milk that is liquid, powdered, or evaporated. Buttermilk that is  made with low-fat milk. Nonfat or low-fat yogurt. Beverages Mineral water. Diet carbonated beverages. Sweets and Desserts Sherbets and fruit ices. Honey, jam, marmalade, jelly, and syrups. Meringues and gelatins. Pure sugar candy, such as hard candy, jelly beans, gumdrops, mints, marshmallows, and small amounts of dark chocolate. [employer] Eat all sweets and desserts in moderation. Fats and Oils Nonhydrogenated (trans-free) margarines. Vegetable oils, including soybean, sesame, sunflower, olive, peanut, safflower, corn, canola, and cottonseed. Salad dressings or mayonnaise that are made with a vegetable oil. Limit added fats and oils that you use for cooking, baking, salads, and as spreads. Other Cocoa powder. Coffee and tea. All seasonings and condiments. The items listed above may not be a complete list of recommended foods or beverages. Contact your dietitian for more options. What foods are not recommended? Grains Breads that are made with saturated or trans fats, oils, or whole milk. Croissants. Butter rolls. Cheese breads. Sweet rolls. Donuts. Buttered popcorn. Chow mein noodles. High-fat crackers, such as cheese or butter crackers. Meats and Other Protein Sources Fatty meats, such as hotdogs, short ribs, sausage, spareribs, bacon, ribeye roast or steak, and mutton. High-fat deli meats, such as salami and bologna. Caviar. Domestic duck and goose. Organ meats, such as kidney, liver, sweetbreads, brains, gizzard, chitterlings, and heart. Dairy Cream, sour cream, cream cheese, and creamed cottage cheese. Whole milk cheeses, including blue (bleu), Monterey Jack, [place], [place], American, [place], Swiss, [place], [place], and [place]. Whole or 2% milk that is  liquid, evaporated, or condensed. Whole buttermilk. Cream sauce or high-fat cheese sauce. Yogurt that is made from whole milk. Beverages Regular sodas and drinks with added sugar. Sweets and Desserts Frosting. Pudding. Cookies.  Cakes other than angel food cake. Candy that has milk chocolate or white chocolate, hydrogenated fat, butter, coconut, or unknown ingredients. Buttered syrups. Full-fat ice cream or ice cream drinks. Fats and Oils Gravy that has suet, meat fat, or shortening. Cocoa butter, hydrogenated oils, palm oil, coconut oil, palm kernel oil. These can often be found in baked products, candy, fried foods, nondairy creamers, and whipped toppings. Solid fats and shortenings, including bacon fat, salt pork, lard, and butter. Nondairy cream substitutes, such as coffee creamers and sour cream substitutes. Salad dressings that are made of unknown oils, cheese, or sour cream. The items listed above may not be a complete list of foods and beverages to avoid. Contact your dietitian for more information. This information is not intended to replace advice given to you by your health care provider. Make sure you discuss any questions you have with your health care provider. Document Released: 04/04/2008 Document Revised: 01/14/2016 Document Reviewed: 12/18/2013 Elsevier Interactive Patient Education  [employer].    I will contact you with your lab results within the next 2 weeks.  If you have not heard from [place] then please contact us. The fastest way to get your results is to register for My Chart.    IF you received an x-ray today, you will receive an invoice from [facility]. Please contact [facility] at [phone] with questions or concerns regarding your invoice.   IF you received labwork today, you will receive an invoice from [facility]. Please contact [facility] at [phone] with questions or concerns regarding your invoice.   Our billing staff will not be able to assist you with questions regarding bills from these companies.  You will be contacted with the lab results as soon as they are available. The fastest way to get your results is to activate your My Chart account. Instructions  are located on the last page of this paperwork. If you have not heard from [place] regarding the results in 2 weeks, please contact this office.

## 2018-02-20 LAB — HEMOGLOBIN A1C
Est. average glucose Bld gHb Est-mCnc: 108 mg/dL
Hgb A1c MFr Bld: 5.4 % (ref 4.8–5.6)

## 2018-02-20 LAB — LIPID PANEL
Chol/HDL Ratio: 6.4 ratio — ABNORMAL HIGH (ref 0.0–4.4)
Cholesterol, Total: 172 mg/dL (ref 100–199)
HDL: 27 mg/dL — ABNORMAL LOW (ref >39)
Triglycerides: 579 mg/dL (ref 0–149)

## 2018-02-22 ENCOUNTER — Emergency Department (HOSPITAL_COMMUNITY): Payer: BLUE CROSS/BLUE SHIELD

## 2018-02-22 ENCOUNTER — Encounter (HOSPITAL_COMMUNITY): Payer: Self-pay | Admitting: Emergency Medicine

## 2018-02-22 ENCOUNTER — Other Ambulatory Visit: Payer: Self-pay

## 2018-02-22 ENCOUNTER — Emergency Department (HOSPITAL_COMMUNITY)
Admission: EM | Admit: 2018-02-22 | Discharge: 2018-02-22 | Disposition: A | Discharge status: Home / Self Care | Payer: BLUE CROSS/BLUE SHIELD | Attending: Emergency Medicine | Admitting: Emergency Medicine

## 2018-02-22 DIAGNOSIS — F17210 Nicotine dependence, cigarettes, uncomplicated: Secondary | ICD-10-CM | POA: Diagnosis not present

## 2018-02-22 DIAGNOSIS — R0789 Other chest pain: Secondary | ICD-10-CM | POA: Insufficient documentation

## 2018-02-22 DIAGNOSIS — J343 Hypertrophy of nasal turbinates: Secondary | ICD-10-CM | POA: Diagnosis not present

## 2018-02-22 DIAGNOSIS — R0602 Shortness of breath: Secondary | ICD-10-CM | POA: Insufficient documentation

## 2018-02-22 DIAGNOSIS — R079 Chest pain, unspecified: Secondary | ICD-10-CM | POA: Diagnosis not present

## 2018-02-22 DIAGNOSIS — Z79899 Other long term (current) drug therapy: Secondary | ICD-10-CM | POA: Diagnosis not present

## 2018-02-22 LAB — CBC
HCT: 39.8 % (ref 36.0–46.0)
Hemoglobin: 13.4 g/dL (ref 12.0–15.0)
MCH: 29.2 pg (ref 26.0–34.0)
MCHC: 33.7 g/dL (ref 30.0–36.0)
MCV: 86.7 fL (ref 78.0–100.0)
PLATELETS: 372 10*3/uL (ref 150–400)
RBC: 4.59 MIL/uL (ref 3.87–5.11)
RDW: 13.4 % (ref 11.5–15.5)
WBC: 12 10*3/uL — ABNORMAL HIGH (ref 4.0–10.5)

## 2018-02-22 LAB — BASIC METABOLIC PANEL
Anion gap: 8 (ref 5–15)
BUN: 9 mg/dL (ref 6–20)
CO2: 23 mmol/L (ref 22–32)
CREATININE: 0.81 mg/dL (ref 0.44–1.00)
Calcium: 8.9 mg/dL (ref 8.9–10.3)
Chloride: 107 mmol/L (ref 98–111)
GFR calc non Af Amer: >60 mL/min (ref >60)
Glucose, Bld: 109 mg/dL — ABNORMAL HIGH (ref 70–99)
Potassium: 3.5 mmol/L (ref 3.5–5.1)
Sodium: 138 mmol/L (ref 135–145)

## 2018-02-22 LAB — PREGNANCY, URINE: Preg Test, Ur: NEGATIVE

## 2018-02-22 LAB — TROPONIN I

## 2018-02-22 LAB — D-DIMER, QUANTITATIVE: D-Dimer, Quant: <0.27 ug/mL-FEU (ref 0.00–0.50)

## 2018-02-22 MED ORDER — HYDROCODONE-ACETAMINOPHEN 5-325 MG PO TABS
1.0000 | ORAL_TABLET | Freq: Once | ORAL | Status: AC
  Administered 2018-02-22: 1 via ORAL
  Filled 2018-02-22: qty 1

## 2018-02-22 MED ORDER — METHOCARBAMOL 500 MG PO TABS: 1000.0000 mg | ORAL_TABLET | Freq: Four times a day (QID) | ORAL | 0 refills | Status: DC | PRN

## 2018-02-22 MED ORDER — KETOROLAC TROMETHAMINE 30 MG/ML IJ SOLN
30.0000 mg | Freq: Once | INTRAMUSCULAR | Status: AC
  Administered 2018-02-22: 30 mg via INTRAVENOUS
  Filled 2018-02-22: qty 1

## 2018-02-22 MED ORDER — HYDROCODONE-ACETAMINOPHEN 5-325 MG PO TABS: ORAL_TABLET | ORAL | 0 refills | Status: DC

## 2018-02-22 NOTE — ED Triage Notes (Signed)
Patient complaining of left sided chest pain, cold chills, and tremors starting today. States she was here recently for same.

## 2018-02-22 NOTE — Discharge Instructions (Signed)
Take the prescriptions as directed.  Apply moist heat or ice to the area(s) of discomfort, for 15 minutes at a time, several times per day for the next few days.  Do not fall asleep on a heating or ice pack.  Call your regular medical doctor today to schedule a follow up appointment next week.  Return to the Emergency Department immediately if worsening.

## 2018-02-22 NOTE — ED Provider Notes (Signed)
[facility] EMERGENCY DEPARTMENT Provider Note   CSN: [id number] Arrival date & time: 02/22/18  1200     History   Chief Complaint Chief Complaint  Patient presents with  . Chest Pain    HPI Theresa Sawyer is a 37 y.o. female.  HPI  Pt was seen at 1235.  Per pt, c/o gradual onset and persistence of constant left upper chest "pain" for the past 4 days. Has been associated with SOB and tremulousness. Pt states her chest pain occasionally will "ease up" but has not truly and completely gone away. Describes the chest pain as "pressure" and "sharp." CP worsens with palpation of the area. Denies N/V, no diarrhea, no fevers, no back pain, no rash, no CP/SOB, no black or blood in stools, no injury. Pt was evaluated in the ED 4 days ago for her symptoms, and by her PMD 3 days ago, dx chest wall pain and anxiety.    Past Medical History:  Diagnosis Date  . Anxiety     Patient Active Problem List   Diagnosis Date Noted  . SVD (spontaneous vaginal delivery) 02/06/2013  . Sterilization consult 01/29/2013  . Rh negative status during pregnancy 12/21/2012  . Supervision of other normal pregnancy 12/16/2012  . Late prenatal care complicating pregnancy in third trimester 12/16/2012    Past Surgical History:  Procedure Laterality Date  . NO PAST SURGERIES    . TUBAL LIGATION Bilateral 02/05/2013   Procedure: POST PARTUM TUBAL LIGATION;  Surgeon: Woodroe Mode, MD;  Location: [facility];  Service: Gynecology;  Laterality: Bilateral;     OB History    Gravida  3   Para  1   Term  1   Preterm  0   AB  2   Living  1     SAB  1   TAB  1   Ectopic  0   Multiple  0   Live Births  1            Home Medications    Prior to Admission medications   Medication Sig Start Date End Date Taking? Authorizing Provider  augmented betamethasone dipropionate (DIPROLENE-AF) 0.05 % cream Apply 1 application topically daily as needed.  12/13/17   [provider]    diphenhydrAMINE (BENADRYL) 25 MG tablet Take 25 mg by mouth every 6 (six) hours as needed for allergies.    [provider]  escitalopram (LEXAPRO) 5 MG tablet Take 1 tablet (5 mg total) by mouth daily. May increase to 70m after 3 weeks if needed 02/19/18   McVey, EGelene Mink PA-C  famotidine (PEPCID) 20 MG tablet Take 1 tablet (20 mg total) by mouth 2 (two) times daily. 02/18/18   KDorie Rank MD  ibuprofen (ADVIL,MOTRIN) 600 MG tablet Take 1 tablet (600 mg total) by mouth every 8 (eight) hours as needed for up to 21 doses. 02/18/18   KDorie Rank MD  Multiple Vitamins-Minerals (MULTIVITAMIN WITH MINERALS) tablet Take 1 tablet by mouth daily.    [provider]  ranitidine (ZANTAC) 150 MG tablet Take 150 mg by mouth daily as needed for heartburn.    [provider]    Family History Family History  Problem Relation Age of Onset  . Diabetes Maternal Grandmother   . Diabetes Paternal Grandmother   . Anxiety disorder Mother   . Heart attack Brother   . Anxiety disorder Brother     Social History Social History   Tobacco Use  . Smoking status:  Current Some Day Smoker    Packs/day: 0.50    Types: Cigarettes  . Smokeless tobacco: Never Used  Substance Use Topics  . Alcohol use: No  . Drug use: No     Allergies   Patient has no known allergies.   Review of Systems Review of Systems ROS: Statement: All systems negative except as marked or noted in the HPI; Constitutional: Negative for fever and chills. +tremulousness.; ; Eyes: Negative for eye pain, redness and discharge. ; ; ENMT: Negative for ear pain, hoarseness, nasal congestion, sinus pressure and sore throat. ; ; Cardiovascular: Negative for palpitations, diaphoresis, and peripheral edema. ; ; Respiratory: +SOB. Negative for cough, wheezing and stridor. ; ; Gastrointestinal: Negative for nausea, vomiting, diarrhea, abdominal pain, blood in stool, hematemesis, jaundice and rectal bleeding. . ; ;  Genitourinary: Negative for dysuria, flank pain and hematuria. ; ; Musculoskeletal: +chest wall pain. Negative for back pain and neck pain. Negative for swelling and trauma.; ; Skin: Negative for pruritus, rash, abrasions, blisters, bruising and skin lesion.; ; Neuro: Negative for headache, lightheadedness and neck stiffness. Negative for weakness, altered level of consciousness, altered mental status, extremity weakness, paresthesias, involuntary movement, seizure and syncope.       Physical Exam Updated Vital Signs BP 130/75 (BP Location: Left Arm)   Pulse 76   Temp 98 F (36.7 C) (Oral)   Resp 20   Ht 5' 5"  (1.651 m)   Wt 98.6 kg   LMP 02/15/2018   SpO2 99%   BMI 36.17 kg/m   Physical Exam 1240: Physical examination:  Nursing notes reviewed; Vital signs and O2 SAT reviewed;  Constitutional: Well developed, Well nourished, Well hydrated, In no acute distress; Head:  Normocephalic, atraumatic; Eyes: EOMI, PERRL, No scleral icterus; ENMT: Clear fluid behind TM's bilat. +edemetous nasal turbinates bilat with clear rhinorrhea. Mouth and pharynx normal, Mucous membranes moist; Neck: Supple, Full range of motion, No lymphadenopathy; Cardiovascular: Regular rate and rhythm, No gallop; Respiratory: Breath sounds clear & equal bilaterally, No wheezes.  Speaking full sentences with ease, Normal respiratory effort/excursion; Chest: +left upper chest wall tenderness to palp, no rash, no soft tissue crepitus, no deformity.  Movement normal; Abdomen: Soft, Nontender, Nondistended, Normal bowel sounds; Genitourinary: No CVA tenderness; Extremities: Peripheral pulses normal, No tenderness, No edema, No calf edema or asymmetry.; Neuro: AA&Ox3, Major CN grossly intact.  Speech clear. No gross focal motor or sensory deficits in extremities.; Skin: Color normal, Warm, Dry.; Psych:  Anxious.    ED Treatments / Results  Labs (all labs ordered are listed, but only abnormal results are displayed)   EKG EKG  Interpretation  Date/Time:  Friday February 22 2018 12:35:50 EDT Ventricular Rate:  74 PR Interval:    QRS Duration: 97 QT Interval:  401 QTC Calculation: 445 R Axis:   40 Text Interpretation:  Sinus rhythm When compared with ECG of 02/18/2018 No significant change was found Confirmed by Francine Graven [id number]) on 02/22/2018 12:45:45 PM   Radiology   Procedures Procedures (including critical care time)  Medications Ordered in ED Medications  ketorolac (TORADOL) 30 MG/ML injection 30 mg (30 mg Intravenous Given 02/22/18 1306)  HYDROcodone-acetaminophen (NORCO/VICODIN) 5-325 MG per tablet 1 tablet (1 tablet Oral Given 02/22/18 1306)     Initial Impression / Assessment and Plan / ED Course  I have reviewed the triage vital signs and the nursing notes.  Pertinent labs & imaging results that were available during my care of the patient were reviewed by me and considered  in my medical decision making (see chart for details).  MDM Reviewed: previous chart, nursing note and vitals Reviewed previous: labs and ECG Interpretation: labs, ECG and x-ray   Results for orders placed or performed during the hospital encounter of 08/09/41  Basic metabolic panel  Result Value Ref Range   Sodium 138 135 - 145 mmol/L   Potassium 3.5 3.5 - 5.1 mmol/L   Chloride 107 98 - 111 mmol/L   CO2 23 22 - 32 mmol/L   Glucose, Bld 109 (H) 70 - 99 mg/dL   BUN 9 6 - 20 mg/dL   Creatinine, Ser 0.81 0.44 - 1.00 mg/dL   Calcium 8.9 8.9 - 10.3 mg/dL   GFR calc non Af Amer >60 >60 mL/min   GFR calc Af Amer >60 >60 mL/min   Anion gap 8 5 - 15  CBC  Result Value Ref Range   WBC 12.0 (H) 4.0 - 10.5 K/uL   RBC 4.59 3.87 - 5.11 MIL/uL   Hemoglobin 13.4 12.0 - 15.0 g/dL   HCT 39.8 36.0 - 46.0 %   MCV 86.7 78.0 - 100.0 fL   MCH 29.2 26.0 - 34.0 pg   MCHC 33.7 30.0 - 36.0 g/dL   RDW 13.4 11.5 - 15.5 %   Platelets 372 150 - 400 K/uL  Troponin I  Result Value Ref Range   Troponin I <0.03 <0.03 ng/mL    Pregnancy, urine  Result Value Ref Range   Preg Test, Ur NEGATIVE NEGATIVE  D-dimer, quantitative  Result Value Ref Range   D-Dimer, Quant <0.27 0.00 - 0.50 ug/mL-FEU   Dg Chest 2 View Result Date: 02/22/2018 CLINICAL DATA:  37 year old female with a history of left anterior chest pain for 1 week EXAM: CHEST - 2 VIEW COMPARISON:  02/18/2018 FINDINGS: Cardiomediastinal silhouette unchanged in size and contour. No evidence of central vascular congestion. No pneumothorax or pleural effusion. No confluent airspace disease. No displaced fracture IMPRESSION: Negative for acute cardiopulmonary disease Electronically Signed   By: Corrie Mckusick D.O.   On: 02/22/2018 13:53    1415:  Doubt PE as cause for symptoms with normal d-dimer and low risk [place].  Doubt ACS as cause for symptoms with normal troponin and unchanged EKG from previous after 4 days of constant symptoms. Tx symptomatically at this time. Dx and testing d/w pt.  Questions answered.  Verb understanding, agreeable to d/c home with outpt f/u.     Final Clinical Impressions(s) / ED Diagnoses   Final diagnoses:  None    ED Discharge Orders    None       Francine Graven, DO 02/24/18 1542

## 2018-03-01 ENCOUNTER — Telehealth: Payer: Self-pay | Admitting: Physician Assistant

## 2018-03-01 NOTE — Telephone Encounter (Signed)
Copied from [place] [id number]. Topic: Quick Communication - Lab Results >> Mar 01, 2018  2:44 PM Scherrie Gerlach wrote: Pt following up on labs results done 8/13.

## 2018-03-04 ENCOUNTER — Encounter: Payer: Self-pay | Admitting: Physician Assistant

## 2018-03-04 NOTE — Progress Notes (Signed)
Please call pt and let her know her cholesterol levels are very high.  The scheduling pool will call her to schedule a follow-up appointment to follow-up with me to discuss treatment.

## 2018-03-05 ENCOUNTER — Encounter (HOSPITAL_COMMUNITY): Payer: Self-pay | Admitting: *Deleted

## 2018-03-05 ENCOUNTER — Emergency Department (HOSPITAL_COMMUNITY)
Admission: EM | Admit: 2018-03-05 | Discharge: 2018-03-06 | Disposition: A | Discharge status: Home / Self Care | Payer: BLUE CROSS/BLUE SHIELD | Attending: Emergency Medicine | Admitting: Emergency Medicine

## 2018-03-05 ENCOUNTER — Emergency Department (HOSPITAL_COMMUNITY): Payer: BLUE CROSS/BLUE SHIELD

## 2018-03-05 DIAGNOSIS — Z79899 Other long term (current) drug therapy: Secondary | ICD-10-CM | POA: Diagnosis not present

## 2018-03-05 DIAGNOSIS — Z7982 Long term (current) use of aspirin: Secondary | ICD-10-CM | POA: Diagnosis not present

## 2018-03-05 DIAGNOSIS — F17210 Nicotine dependence, cigarettes, uncomplicated: Secondary | ICD-10-CM | POA: Diagnosis not present

## 2018-03-05 DIAGNOSIS — R0789 Other chest pain: Secondary | ICD-10-CM | POA: Diagnosis not present

## 2018-03-05 DIAGNOSIS — F419 Anxiety disorder, unspecified: Secondary | ICD-10-CM | POA: Diagnosis not present

## 2018-03-05 DIAGNOSIS — R079 Chest pain, unspecified: Secondary | ICD-10-CM | POA: Diagnosis not present

## 2018-03-05 HISTORY — DX: Pure hypercholesterolemia, unspecified: E78.00

## 2018-03-05 MED ORDER — CYCLOBENZAPRINE HCL 10 MG PO TABS: 10.0000 mg | ORAL_TABLET | Freq: Three times a day (TID) | ORAL | 0 refills | Status: DC

## 2018-03-05 MED ORDER — KETOROLAC TROMETHAMINE 10 MG PO TABS
10.0000 mg | ORAL_TABLET | Freq: Once | ORAL | Status: AC
  Administered 2018-03-06: 10 mg via ORAL
  Filled 2018-03-05: qty 1

## 2018-03-05 MED ORDER — ACETAMINOPHEN 500 MG PO TABS
1000.0000 mg | ORAL_TABLET | Freq: Once | ORAL | Status: AC
  Administered 2018-03-06: 1000 mg via ORAL
  Filled 2018-03-05: qty 2

## 2018-03-05 MED ORDER — DICLOFENAC SODIUM 75 MG PO TBEC: 75.0000 mg | DELAYED_RELEASE_TABLET | Freq: Two times a day (BID) | ORAL | 0 refills | Status: DC

## 2018-03-05 MED ORDER — CYCLOBENZAPRINE HCL 10 MG PO TABS
10.0000 mg | ORAL_TABLET | Freq: Once | ORAL | Status: AC
  Administered 2018-03-06: 10 mg via ORAL
  Filled 2018-03-05: qty 1

## 2018-03-05 NOTE — ED Triage Notes (Signed)
Pt seen for same on 02/22/18 and dx with muscle spasms per pt.  Pain never went away fully per pt. Pt has ran out of muscle relaxers.

## 2018-03-06 NOTE — ED Provider Notes (Signed)
[facility] EMERGENCY DEPARTMENT Provider Note   CSN: [id number] Arrival date & time: 03/05/18  1910     History   Chief Complaint Chief Complaint  Patient presents with  . Chest Pain    HPI Theresa Sawyer is a 37 y.o. female.  Patient is a 37 year old female who presents to the emergency department with a complaint of chest pain.  The patient states that this evening she had been to work, she came home, was sitting down watching TV when she developed a sharp pain in her left chest that got progressively worse.  The pain is worse with certain movements of the arm or chest.  The patient states that she is not short of breath.  She is not having any vomiting, or nausea.  She has not had any known injury.  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  It is also of note that the patient had a similar episode on August 16 at which time she was seen in the emergency department with a negative work-up.  She was told that she had muscle spasm.  She says that the pain never completely went away.  She has run out of her muscle relaxers, and now the pain seems to be worse.  There is been no recent fever or chills to be reported.  She has not taken any medication up to this point for the issue.  The history is provided by the patient.  Chest Pain   Pertinent negatives include no abdominal pain, no back pain, no cough, no dizziness, no palpitations and no shortness of breath.  Pertinent negatives for past medical history include no seizures.    Past Medical History:  Diagnosis Date  . Anxiety   . High cholesterol     Patient Active Problem List   Diagnosis Date Noted  . SVD (spontaneous vaginal delivery) 02/06/2013  . Sterilization consult 01/29/2013  . Rh negative status during pregnancy 12/21/2012  . Supervision of other normal pregnancy 12/16/2012  . Late prenatal care complicating pregnancy in third trimester 12/16/2012    Past  Surgical History:  Procedure Laterality Date  . NO PAST SURGERIES    . TUBAL LIGATION Bilateral 02/05/2013   Procedure: POST PARTUM TUBAL LIGATION;  Surgeon: Woodroe Mode, MD;  Location: [facility];  Service: Gynecology;  Laterality: Bilateral;     OB History    Gravida  3   Para  1   Term  1   Preterm  0   AB  2   Living  1     SAB  1   TAB  1   Ectopic  0   Multiple  0   Live Births  1            Home Medications    Prior to Admission medications   Medication Sig Start Date End Date Taking? Authorizing Provider  alum & mag hydroxide-simeth (MAALOX/MYLANTA) 200-200-20 MG/5ML suspension Take 15 mLs by mouth every 6 (six) hours as needed for indigestion or heartburn.   Yes [provider]  aspirin EC 81 MG tablet Take 81 mg by mouth daily.   Yes [provider]  augmented betamethasone dipropionate (DIPROLENE-AF) 0.05 % cream Apply 1 application topically daily as needed.  12/13/17  Yes [provider]  escitalopram (LEXAPRO) 5 MG tablet Take 1 tablet (5 mg total) by mouth daily. May increase to 62m after 3 weeks  if needed 02/19/18  Yes McVey, Gelene Mink, PA-C  ibuprofen (ADVIL,MOTRIN) 600 MG tablet Take 1 tablet (600 mg total) by mouth every 8 (eight) hours as needed for up to 21 doses. 02/18/18  Yes Dorie Rank, MD  Menthol, Topical Analgesic, (ICY HOT EX) Apply 1 application topically daily as needed (for muscle aches/pain).   Yes [provider]  Multiple Vitamins-Minerals (MULTIVITAMIN WITH MINERALS) tablet Take 1 tablet by mouth daily.   Yes [provider]  ranitidine (ZANTAC) 150 MG tablet Take 150 mg by mouth daily as needed for heartburn.   Yes [provider]  cyclobenzaprine (FLEXERIL) 10 MG tablet Take 1 tablet (10 mg total) by mouth 3 (three) times daily. 03/05/18   Lily Kocher, PA-C  diclofenac (VOLTAREN) 75 MG EC tablet Take 1 tablet (75 mg total) by mouth 2 (two) times daily. 03/05/18   Lily Kocher, PA-C  famotidine (PEPCID) 20 MG tablet Take 1 tablet (20 mg total) by mouth 2 (two) times daily. Patient not taking: Reported on 03/05/2018 02/18/18   Dorie Rank, MD  HYDROcodone-acetaminophen (NORCO/VICODIN) 5-325 MG tablet 1 or 2 tabs PO q6 hours prn pain Patient not taking: Reported on 03/05/2018 02/22/18   Francine Graven, DO  methocarbamol (ROBAXIN) 500 MG tablet Take 2 tablets (1,000 mg total) by mouth 4 (four) times daily as needed for muscle spasms (muscle spasm/pain). Patient not taking: Reported on 03/05/2018 02/22/18   Francine Graven, DO    Family History Family History  Problem Relation Age of Onset  . Diabetes Maternal Grandmother   . Diabetes Paternal Grandmother   . Anxiety disorder Mother   . Heart attack Brother   . Anxiety disorder Brother     Social History Social History   Tobacco Use  . Smoking status: Current Some Day Smoker    Packs/day: 0.50    Types: Cigarettes  . Smokeless tobacco: Never Used  Substance Use Topics  . Alcohol use: No  . Drug use: No     Allergies   Patient has no known allergies.   Review of Systems Review of Systems  Constitutional: Negative for activity change.       All ROS Neg except as noted in HPI  HENT: Negative for nosebleeds.   Eyes: Negative for photophobia and discharge.  Respiratory: Negative for cough, shortness of breath and wheezing.   Cardiovascular: Positive for chest pain. Negative for palpitations.  Gastrointestinal: Negative for abdominal pain and blood in stool.  Genitourinary: Negative for dysuria, frequency and hematuria.  Musculoskeletal: Negative for arthralgias, back pain and neck pain.  Skin: Negative.   Neurological: Negative for dizziness, seizures and speech difficulty.  Psychiatric/Behavioral: Negative for confusion and hallucinations. The patient is nervous/anxious.      Physical Exam Updated Vital Signs BP 127/83   Pulse 76   Temp 97.9 F (36.6 C) (Oral)   Resp 16   Ht 5' 5"   (1.651 m)   Wt 98 kg   LMP 02/15/2018   SpO2 98%   BMI 35.94 kg/m   Physical Exam  Constitutional: She is oriented to person, place, and time. She appears well-developed and well-nourished.  Non-toxic appearance.  HENT:  Head: Normocephalic.  Right Ear: Tympanic membrane and external ear normal.  Left Ear: Tympanic membrane and external ear normal.  Eyes: Pupils are equal, round, and reactive to light. EOM and lids are normal.  Neck: Normal range of motion. Neck supple. Carotid bruit is not present.  Cardiovascular: Normal rate, regular rhythm, normal heart  sounds, intact distal pulses and normal pulses.  Pulmonary/Chest: Breath sounds normal. No respiratory distress. She exhibits tenderness. She exhibits no crepitus, no deformity and no retraction.  Patient's husband present during the examination.  Patient has multiple areas of tenderness to palpation in multiple areas of tenderness with attempted range of motion of the shoulder and chest wall.    Abdominal: Soft. Bowel sounds are normal. There is no tenderness. There is no guarding.  Musculoskeletal: Normal range of motion.  Lymphadenopathy:       Head (right side): No submandibular adenopathy present.       Head (left side): No submandibular adenopathy present.    She has no cervical adenopathy.  Neurological: She is alert and oriented to person, place, and time. She has normal strength. No cranial nerve deficit or sensory deficit.  Skin: Skin is warm and dry.  Psychiatric: Her speech is normal. Her mood appears anxious. She expresses no homicidal and no suicidal ideation.  Nursing note and vitals reviewed.    ED Treatments / Results  Labs (all labs ordered are listed, but only abnormal results are displayed) Labs Reviewed - No data to display  EKG None  Radiology Dg Chest 2 View  Result Date: 03/05/2018 CLINICAL DATA:  Left chest wall pain. EXAM: CHEST - 2 VIEW COMPARISON:  02/22/2018 FINDINGS: Heart and mediastinal  contours are within normal limits. No focal opacities or effusions. No acute bony abnormality. IMPRESSION: No active cardiopulmonary disease. Electronically Signed   By: Rolm Baptise M.D.   On: 03/05/2018 23:45    Procedures Procedures (including critical care time)  Medications Ordered in ED Medications  ketorolac (TORADOL) tablet 10 mg (has no administration in time range)  cyclobenzaprine (FLEXERIL) tablet 10 mg (has no administration in time range)  acetaminophen (TYLENOL) tablet 1,000 mg (has no administration in time range)     Initial Impression / Assessment and Plan / ED Course  I have reviewed the triage vital signs and the nursing notes.  Pertinent labs & imaging results that were available during my care of the patient were reviewed by me and considered in my medical decision making (see chart for details).       Final Clinical Impressions(s) / ED Diagnoses MDM Vital signs reviewed.  Pulse oximetry is 98% on room air.  Within normal limits by my interpretation.  I have reviewed the studies from the previous examination on February 22, 2018.  The patient speaks in complete sentences without problem.  I can reproduce the pain by palpation, and by range of motion of the chest wall.  Chest x-ray is again negative.  Pulse oximetry has been stable since the patient came to the emergency department.  There is no cough or complaint of shortness of breath.  There is no radiation of the pain other than described.  I have asked the patient to use a heating pad to her chest.  Prescription for Flexeril and diclofenac given to the patient.  Of asked patient to use Tylenol extra strength every 4 hours for pain in between the doses of medication.  I have asked her to discuss this with her primary physician so that a plan can be prepared.  Patient is in agreement with this plan.  Patient is ambulatory at discharge conversing with her husband without problem and in no distress at discharge.       Final diagnoses:  Chest wall pain  Anxiety    ED Discharge Orders  Ordered    cyclobenzaprine (FLEXERIL) 10 MG tablet  3 times daily     03/05/18 2357    diclofenac (VOLTAREN) 75 MG EC tablet  2 times daily     03/05/18 2357           Lily Kocher, PA-C 03/06/18 0020    Virgel Manifold, MD 03/07/18 1447

## 2018-03-06 NOTE — Discharge Instructions (Addendum)
Your vital signs are within normal limits.  Your oxygen level is 98% on room air.  Within normal limits by my interpretation.  Your pain can be reproduced with certain movements of your chest wall.  Your chest x-ray is again negative for any acute changes or problems.  I have reviewed the extensive studies that you had on your last emergency department visit, and all of them were negative.  Your examination favors chest wall pain.  This is probably from repetitive use from your work and also your activities of daily living.  Please rest your shoulder and upper chest as much as you can.  Please use Flexeril 3 times daily to improve the spasm pain.  Please use diclofenac 2 times daily with a meal.  Please use Tylenol extra strength in between the doses if needed for pain or soreness.  Discussed this with Ms. McVay so that an ongoing treatment plan can be developed.  Please return to the emergency department if any emergent changes in your condition, problems, or concerns.

## 2018-03-07 ENCOUNTER — Ambulatory Visit: Payer: BLUE CROSS/BLUE SHIELD | Admitting: Physician Assistant

## 2018-03-07 ENCOUNTER — Encounter: Payer: Self-pay | Admitting: Physician Assistant

## 2018-03-07 ENCOUNTER — Other Ambulatory Visit: Payer: Self-pay

## 2018-03-07 VITALS — BP 116/82 | HR 86 | Temp 98.6°F | Resp 16 | Ht 65.0 in | Wt 218.2 lb

## 2018-03-07 DIAGNOSIS — R0789 Other chest pain: Secondary | ICD-10-CM | POA: Diagnosis not present

## 2018-03-07 DIAGNOSIS — F411 Generalized anxiety disorder: Secondary | ICD-10-CM | POA: Diagnosis not present

## 2018-03-07 DIAGNOSIS — E785 Hyperlipidemia, unspecified: Secondary | ICD-10-CM

## 2018-03-07 MED ORDER — ESCITALOPRAM OXALATE 10 MG PO TABS: 10.0000 mg | ORAL_TABLET | Freq: Every day | ORAL | 3 refills | Status: DC

## 2018-03-07 NOTE — Patient Instructions (Addendum)
Start taking Lexapro 10 mg in the morning.  May increase to 15 mg after 3 weeks if needed.  Come back and see me in 4-6 weeks.   You will receive a phone call to schedule an appointment with PT.    Thank you for coming in today. I hope you feel we met your needs.  Feel free to call PCP if you have any questions or further requests.  Please consider signing up for MyChart if you do not already have it, as this is a great way to communicate with me.  Best,  Whitney McVey, PA-C  IF you received an x-ray today, you will receive an invoice from [facility]. Please contact [facility] at [phone] with questions or concerns regarding your invoice.   IF you received labwork today, you will receive an invoice from [facility]. Please contact [facility] at [phone] with questions or concerns regarding your invoice.   Our billing staff will not be able to assist you with questions regarding bills from these companies.  You will be contacted with the lab results as soon as they are available. The fastest way to get your results is to activate your My Chart account. Instructions are located on the last page of this paperwork. If you have not heard from [place] regarding the results in 2 weeks, please contact this office.

## 2018-03-07 NOTE — Progress Notes (Signed)
Theresa Sawyer  MRN: [id number] DOB: 29-May-1981  PCP: Dorise Hiss, PA-C  Subjective:  Pt presents to clinic for f/u after visit to [facility] ED on 8/12. She was seen for chest pain.  She was diagnosed with chest wall pain, Rx for flexeril and voltaren.  This was her third visit to the emergency department for this problem in 2019 - each visit she was diagnosed with atypical chest pain or chest wall pain.  Pain is of her left chest and left side beneath her left arm.  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 It has helped some. She never went up on the dose as advised. She is taking 5 mg daily. Denies side effects.   Review of Systems  Respiratory: Negative for cough, shortness of breath and wheezing.   Cardiovascular: Positive for chest pain. Negative for palpitations.  Psychiatric/Behavioral: The patient is nervous/anxious.     Patient Active Problem List   Diagnosis Date Noted  . SVD (spontaneous vaginal delivery) 02/06/2013  . Sterilization consult 01/29/2013  . Rh negative status during pregnancy 12/21/2012  . Supervision of other normal pregnancy 12/16/2012  . Late prenatal care complicating pregnancy in third trimester 12/16/2012    Current Outpatient Medications on File Prior to Visit  Medication Sig Dispense Refill  . alum & mag hydroxide-simeth (MAALOX/MYLANTA) 200-200-20 MG/5ML suspension Take 15 mLs by mouth every 6 (six) hours as needed for indigestion or heartburn.    Marland Kitchen aspirin EC 81 MG tablet Take 81 mg by mouth daily.    Marland Kitchen augmented betamethasone dipropionate (DIPROLENE-AF) 0.05 % cream Apply 1 application topically daily as needed.   2  . cyclobenzaprine (FLEXERIL) 10 MG tablet Take 1 tablet (10 mg total) by mouth 3 (three) times daily. 20 tablet 0  . diclofenac (VOLTAREN) 75 MG EC tablet Take 1 tablet (75 mg total) by mouth 2 (two) times daily. 12 tablet 0  .  escitalopram (LEXAPRO) 5 MG tablet Take 1 tablet (5 mg total) by mouth daily. May increase to 5m after 3 weeks if needed 30 tablet 3  . Menthol, Topical Analgesic, (ICY HOT EX) Apply 1 application topically daily as needed (for muscle aches/pain).    . Multiple Vitamins-Minerals (MULTIVITAMIN WITH MINERALS) tablet Take 1 tablet by mouth daily.    . ranitidine (ZANTAC) 150 MG tablet Take 150 mg by mouth daily as needed for heartburn.    . famotidine (PEPCID) 20 MG tablet Take 1 tablet (20 mg total) by mouth 2 (two) times daily. (Patient not taking: Reported on 03/05/2018) 14 tablet 0  . HYDROcodone-acetaminophen (NORCO/VICODIN) 5-325 MG tablet 1 or 2 tabs PO q6 hours prn pain (Patient not taking: Reported on 03/05/2018) 12 tablet 0  . ibuprofen (ADVIL,MOTRIN) 600 MG tablet Take 1 tablet (600 mg total) by mouth every 8 (eight) hours as needed for up to 21 doses. (Patient not taking: Reported on 03/07/2018) 21 tablet 0  . methocarbamol (ROBAXIN) 500 MG tablet Take 2 tablets (1,000 mg total) by mouth 4 (four) times daily as needed for muscle spasms (muscle spasm/pain). (Patient not taking: Reported on 03/05/2018) 25 tablet 0   No current facility-administered medications on file prior to visit.     No Known Allergies   Objective:  BP 116/82 (BP Location: Right Arm, Patient Position: Sitting, Cuff Size: Large)   Pulse 86   Temp 98.6 F (37  C) (Oral)   Resp 16   Ht 5' 5"  (1.651 m)   Wt 218 lb 3.2 oz (99 kg)   LMP 02/15/2018   SpO2 97%   BMI 36.31 kg/m   Physical Exam  Constitutional: She is oriented to person, place, and time. No distress.  Cardiovascular: Normal rate, regular rhythm and normal heart sounds.  Neurological: She is alert and oriented to person, place, and time.  Skin: Skin is warm and dry.  Psychiatric: Judgment normal.  Vitals reviewed.   Assessment and Plan :  1. Chest wall pain - pt recently seen in the ED for chest pain - this was her 3rd visit to the ED this year for  this c/c, cleared for cardiac in nature. 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  Muscle relaxer and NSAID are not working. Plan to refer to PT for treatment.  - Ambulatory referral to Physical Therapy  2. Generalized anxiety disorder - Advised pt to increase dose to 53m. May titrate up to 16mafter 3 weeks if needed. RTC in 4-6 weeks to recheck.  - escitalopram (LEXAPRO) 10 MG tablet; Take 1 tablet (10 mg total) by mouth daily. After 3 weeks, if needed may take 26m826mab and 47m56mb  Dispense: 30 tablet; Refill: 3  3. Elevated lipids - RTC in one week to recheck fasting lipid levels.  - Lipid panel; Future   WhitMercer Pod-C  [facility] [place]/2019 12:42 PM  Please note: Portions of this report may have been transcribed using dragon voice recognition software. Every effort was made to ensure accuracy; however, inadvertent computerized transcription errors may be present.

## 2018-03-13 ENCOUNTER — Other Ambulatory Visit: Payer: Self-pay | Admitting: Physician Assistant

## 2018-03-13 ENCOUNTER — Telehealth: Payer: Self-pay | Admitting: Physician Assistant

## 2018-03-13 NOTE — Telephone Encounter (Signed)
Refill of flexeril by historical provider  LRF 03/05/18  #20 0 refills  LOV 03/07/18 [place]  [facility] [phone] - [place], [place] [address] AT [place] [address] [place] [address] Phone: [phone] Fax: [phone]

## 2018-03-13 NOTE — Telephone Encounter (Signed)
Copied from [place] [phone]. Topic: Quick Communication - Rx Refill/Question >> Mar 13, 2018  5:02 PM Neva Seat wrote: cyclobenzaprine (FLEXERIL) 10 MG tablet  Pt wanting more prescribed.  [facility] [phone] - [place], [place] - [place] AT [place] [address] [place] [address] Phone: [phone] Fax: [phone]

## 2018-03-14 DIAGNOSIS — R252 Cramp and spasm: Secondary | ICD-10-CM | POA: Diagnosis not present

## 2018-03-14 DIAGNOSIS — R0789 Other chest pain: Secondary | ICD-10-CM | POA: Diagnosis not present

## 2018-03-15 ENCOUNTER — Other Ambulatory Visit: Payer: Self-pay | Admitting: Physician Assistant

## 2018-03-15 DIAGNOSIS — R0789 Other chest pain: Secondary | ICD-10-CM

## 2018-03-15 MED ORDER — CYCLOBENZAPRINE HCL 10 MG PO TABS: 10.0000 mg | ORAL_TABLET | Freq: Three times a day (TID) | ORAL | 1 refills | Status: DC

## 2018-03-15 NOTE — Telephone Encounter (Signed)
Please see note below and refill if possible

## 2018-03-19 ENCOUNTER — Ambulatory Visit: Payer: BLUE CROSS/BLUE SHIELD | Admitting: Physician Assistant

## 2018-03-22 ENCOUNTER — Other Ambulatory Visit: Payer: Self-pay

## 2018-03-22 ENCOUNTER — Ambulatory Visit: Payer: BLUE CROSS/BLUE SHIELD | Admitting: Physician Assistant

## 2018-03-22 ENCOUNTER — Encounter: Payer: Self-pay | Admitting: Physician Assistant

## 2018-03-22 VITALS — BP 110/90 | HR 99 | Temp 98.2°F | Resp 16 | Ht 64.0 in | Wt 216.0 lb

## 2018-03-22 DIAGNOSIS — F411 Generalized anxiety disorder: Secondary | ICD-10-CM | POA: Diagnosis not present

## 2018-03-22 DIAGNOSIS — K219 Gastro-esophageal reflux disease without esophagitis: Secondary | ICD-10-CM | POA: Diagnosis not present

## 2018-03-22 DIAGNOSIS — E785 Hyperlipidemia, unspecified: Secondary | ICD-10-CM | POA: Diagnosis not present

## 2018-03-22 MED ORDER — PANTOPRAZOLE SODIUM 40 MG PO TBEC: 40.0000 mg | DELAYED_RELEASE_TABLET | Freq: Every day | ORAL | 0 refills | Status: DC

## 2018-03-22 NOTE — Patient Instructions (Addendum)
  Start taking Lexapro 40m/day.   Start taking Pantoprazole 40 mg every morning 340m prior to the first meal of the day x 6 weeks. Then break in half and take 2049m 2 weeks.   Continue avoiding foods that trigger your symptoms.   I will contact you about H Pylori results and possible treatment plant.  Come back and see me in 8 weeks to recheck symptoms.    IF you received an x-ray today, [facility]. Please contact [facility] at [phone] questions or concerns regarding your invoice.   IF you received labwork today, you will receive an invoice from [facility] contact [facility] at [phone] questions or concerns regarding your invoice.   Our billing staff will not be able to assist you with questions regarding bills from these companies.  You will be contacted with the lab results as soon as they are available. The fastest way to get your results is to activate your My Chart account. Instructions are located on the last page of this paperwork. If you have not heard from us [place] the results in 2 weeks, please contact this office.

## 2018-03-22 NOTE — Progress Notes (Signed)
Theresa Sawyer  MRN: [id number] DOB: 1981/03/02  PCP: Dorise Hiss, PA-C  Subjective:  Pt is a pleasant 37 year old female who presents to clinic for follow-up of several complaints.   1) Chest pain - referred to PT at her last OV on 8/29. She is using ice and heating pad, stretching. "I still sometimes feel tenderness, but it's nothing like it was"  PT starts on Sept 18.   2) Anxiety - Lexapro 80m. She has noticed a little bit of improvement, feels like there is room for improvement. Denies medication side effects. Denies SI or HI.   3) Belching more than normal. birning feeling in her throat. LUQ pain about 1-2x/week. Reflux of stomach acid, about 4 episodes/month.  Sleeps on 2 pillows. Can "get really bad" at night when she lays down.  Tries not to eat late at night, sits up more.  Triggers: spaghetti sauce, tomatos, cucumbers, chocolate.   She takes Mylanta, Zantac. She has not taken Nexium in the past 3 weeks.  She has never been treated for reflux.  This has been a problem for her x several years.  Mother has GERD.   Review of Systems  Constitutional: Negative for chills, diaphoresis, fatigue and fever.  Respiratory: Negative for cough, chest tightness, shortness of breath and wheezing.   Cardiovascular: Positive for chest pain (left-sided). Negative for palpitations.  Gastrointestinal: Positive for abdominal pain, nausea and vomiting. Negative for constipation and diarrhea.  Psychiatric/Behavioral: Negative for self-injury and suicidal ideas. The patient is nervous/anxious.     Patient Active Problem List   Diagnosis Date Noted  . SVD (spontaneous vaginal delivery) 02/06/2013  . Sterilization consult 01/29/2013  . Rh negative status during pregnancy 12/21/2012  . Supervision of other normal pregnancy 12/16/2012  . Late prenatal care complicating pregnancy in third trimester 12/16/2012    Current Outpatient Medications on File Prior to Visit  Medication  Sig Dispense Refill  . alum & mag hydroxide-simeth (MAALOX/MYLANTA) 200-200-20 MG/5ML suspension Take 15 mLs by mouth every 6 (six) hours as needed for indigestion or heartburn.    .Marland Kitchenaspirin EC 81 MG tablet Take 81 mg by mouth daily.    .Marland Kitchenaugmented betamethasone dipropionate (DIPROLENE-AF) 0.05 % cream Apply 1 application topically daily as needed.   2  . cyclobenzaprine (FLEXERIL) 10 MG tablet Take 1 tablet (10 mg total) by mouth 3 (three) times daily. 30 tablet 1  . escitalopram (LEXAPRO) 10 MG tablet Take 1 tablet (10 mg total) by mouth daily. After 3 weeks, if needed may take 561mtab and 10109mab 30 tablet 3  . Menthol, Topical Analgesic, (ICY HOT EX) Apply 1 application topically daily as needed (for muscle aches/pain).    . Multiple Vitamins-Minerals (MULTIVITAMIN WITH MINERALS) tablet Take 1 tablet by mouth daily.    . diclofenac (VOLTAREN) 75 MG EC tablet Take 1 tablet (75 mg total) by mouth 2 (two) times daily. (Patient not taking: Reported on 03/22/2018) 12 tablet 0  . HYDROcodone-acetaminophen (NORCO/VICODIN) 5-325 MG tablet 1 or 2 tabs PO q6 hours prn pain (Patient not taking: Reported on 03/05/2018) 12 tablet 0  . methocarbamol (ROBAXIN) 500 MG tablet Take 2 tablets (1,000 mg total) by mouth 4 (four) times daily as needed for muscle spasms (muscle spasm/pain). (Patient not taking: Reported on 03/05/2018) 25 tablet 0  . ranitidine (ZANTAC) 150 MG tablet Take 150 mg by mouth daily as needed for heartburn.     No current facility-administered medications on file prior to  visit.     No Known Allergies   Objective:  BP 110/90 (BP Location: Left Arm, Patient Position: Sitting, Cuff Size: Large)   Pulse 99   Temp 98.2 F (36.8 C) (Oral)   Resp 16   Ht 5' 4"  (1.626 m)   Wt 216 lb (98 kg)   LMP 03/14/2018   SpO2 96%   BMI 37.08 kg/m   Physical Exam  Constitutional: She is oriented to person, place, and time. No distress.  Cardiovascular: Normal rate, regular rhythm and normal heart  sounds.  Pulmonary/Chest: She exhibits tenderness and deformity. She exhibits no mass, no bony tenderness, no crepitus, no edema and no swelling.    Abdominal: Normal appearance and bowel sounds are normal. There is no tenderness.  Neurological: She is alert and oriented to person, place, and time.  Skin: Skin is warm and dry.  Psychiatric: Judgment normal.  Vitals reviewed.   Assessment and Plan :   1. Generalized anxiety disorder - Increase Lexapro to 51m qd.  Denies SI or HI. Denies medication SE. F/u in 6-8 weeks.   2. Gastroesophageal reflux disease, esophagitis presence not specified - Pt endorses worsening reflux symptoms. Will check H Pylori today and treat with 8 weeks PPI. Discussed avoidance of triggers.  RTC in 8 weeks to recheck sympotms.  - H. pylori breath test - pantoprazole (PROTONIX) 40 MG tablet; Take 1 tablet (40 mg total) by mouth daily.  Dispense: 60 tablet; Refill: 0  3. Elevated lipids - Pt was not fasting with last check. Will recheck today and contact with results.  - Lipid panel  WMercer Pod PA-C  [facility]9/13/2019 1:45 PM  Please note: Portions of this report may have been transcribed using dragon voice recognition software. Every effort was made to ensure accuracy; however, inadvertent computerized transcription errors may be present.

## 2018-03-23 LAB — LIPID PANEL
Chol/HDL Ratio: 5.3 ratio — ABNORMAL HIGH (ref 0.0–4.4)
Cholesterol, Total: 181 mg/dL (ref 100–199)
HDL: 34 mg/dL — ABNORMAL LOW (ref >39)
LDL Calculated: 80 mg/dL (ref 0–99)
Triglycerides: 335 mg/dL — ABNORMAL HIGH (ref 0–149)
VLDL Cholesterol Cal: 67 mg/dL — ABNORMAL HIGH (ref 5–40)

## 2018-03-23 LAB — H. PYLORI BREATH COLLECTION

## 2018-03-23 LAB — H. PYLORI BREATH TEST: H pylori Breath Test: NEGATIVE

## 2018-03-28 ENCOUNTER — Ambulatory Visit (HOSPITAL_COMMUNITY): Payer: BLUE CROSS/BLUE SHIELD | Attending: Physician Assistant | Admitting: Physical Therapy

## 2018-03-28 ENCOUNTER — Encounter (HOSPITAL_COMMUNITY): Payer: Self-pay | Admitting: Physical Therapy

## 2018-03-28 ENCOUNTER — Other Ambulatory Visit: Payer: Self-pay

## 2018-03-28 DIAGNOSIS — R29898 Other symptoms and signs involving the musculoskeletal system: Secondary | ICD-10-CM | POA: Diagnosis not present

## 2018-03-28 DIAGNOSIS — R0789 Other chest pain: Secondary | ICD-10-CM | POA: Diagnosis present

## 2018-03-28 NOTE — Patient Instructions (Addendum)
Resisted External Rotation: in Neutral - Bilateral    Sit or stand, tubing in both hands, elbows at sides, bent to 90, forearms forward. Pinch shoulder blades together and rotate forearms out. Keep elbows at sides. Repeat ____ times per set. Do ____ sets per session. Do ____ sessions per day.  http://orth.exer.us/966   Copyright  VHI. All rights reserved.  PNF Strengthening: Resisted    Standing with resistive band around each hand, bring left arm up and away, thumb back. Repeat _5-15__ times per set. Do ___1_ sets per session. Do 2____ sessions per day.  http://orth.exer.us/918   Copyright  VHI. All rights reserved.  PNF Strengthening: Resisted    Standing with resistive band around each hand, bring left  arm up and across body. Repeat _5-15___ times per set. Do _1___ sets per session. Do __2__ sessions per day. 1 http://orth.exer.us/920   Copyright  VHI. All rights reserved.

## 2018-03-28 NOTE — Therapy (Signed)
[facility] [place], [place], [address] Phone: [phone]   Fax:  [phone]  Physical Therapy Evaluation  Patient Details  Name: Theresa Sawyer MRN: [id number] Date of Birth: 1981/06/04 Referring Provider: Hessie Knows    Encounter Date: 03/28/2018  PT End of Session - 03/28/18 1602    Visit Number  1    Number of Visits  8    Date for PT Re-Evaluation  04/27/18    Authorization - Visit Number  1    Authorization - Number of Visits  8    PT Start Time  1430    PT Stop Time  1510    PT Time Calculation (min)  40 min    Activity Tolerance  Patient tolerated treatment well    Behavior During Therapy  [facility] for tasks assessed/performed       Past Medical History:  Diagnosis Date  . Anxiety   . High cholesterol     Past Surgical History:  Procedure Laterality Date  . NO PAST SURGERIES    . TUBAL LIGATION Bilateral 02/05/2013   Procedure: POST PARTUM TUBAL LIGATION;  Surgeon: Woodroe Mode, MD;  Location: [facility];  Service: Gynecology;  Laterality: Bilateral;    There were no vitals filed for this visit.   Subjective Assessment - 03/28/18 1434    Subjective  Pt states that she has had LT chest wall pain for two months now.  Initially she was having so much pain that she went to the ER thinking that she was having a heart attack.  She was cleared for that, but continued to have pain.  She ended up going to the ER two more times due to pain.  They feel that she must have pulled a mm therefore they referred her to Physical therapy.  The pain is aggrevated by lifting or moving.      Limitations  Lifting;House hold activities    Currently in Pain?  Yes    Pain Score  3    worst 6/10; best 3/10   Pain Location  Chest    Pain Orientation  Left;Anterior    Pain Descriptors / Indicators  Pressure    Pain Type  Chronic pain    Pain Onset  More than a month ago    Pain Frequency  Constant    Aggravating Factors   lifting  and pulling     Pain Relieving Factors  heat, ice, meds     Effect of Pain on Daily Activities  limits          [facility] PT Assessment - 03/28/18 0001      Assessment   Medical Diagnosis  LT chest wall pain     Referring Provider  Whiney McVey     Onset Date/Surgical Date  02/12/18    Hand Dominance  Right    Next MD Visit  05/08/2018    Prior Therapy  none       Precautions   Precautions  None      Restrictions   Weight Bearing Restrictions  No      Balance Screen   Has the patient fallen in the past 6 months  No    Has the patient had a decrease in activity level because of a fear of falling?   Yes    Is the patient reluctant to leave their home because of a fear of falling?   No      Home  [place]video [place] residence      Prior Function   Level of Independence  Independent    Vocation  Full time employment    Barista     Leisure  none       Cognition   Overall Cognitive Status  Within Functional Limits for tasks assessed      ROM / Strength   AROM / PROM / Strength  AROM;Strength      AROM   AROM Assessment Site  Shoulder;Cervical    Right/Left Shoulder  --   wnl some tightness with ER    Cervical Flexion  WFL    Cervical Extension  WFL    Cervical - Right Side Bend  WFL   complain of tightness at end range    Cervical - Left Side Bend  WFL    Cervical - Right Rotation  WFL    Cervical - Left Rotation  WFL   c/o increased tightness     Strength   Overall Strength Comments  ER 4-/5 B all other shoulder wnl; cervical wnl       Palpation   Palpation comment  tender to Lt upper trap with increased tension                 Objective measurements completed on examination: See above findings.      [facility] Adult PT Treatment/Exercise - 03/28/18 0001      Exercises   Exercises  Shoulder      Shoulder Exercises: Seated   External Rotation  Both;5 reps    Theraband Level (Shoulder External Rotation)   Level 2 (Red)    Diagonals  Strengthening;Left;5 reps   PNF 1 and 2.    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     Time  2    Period  Weeks    Status  New        PT Long Term Goals - 03/28/18 1614      PT LONG TERM GOAL #1   Title  PT to have had no chest wall pain in the past week     Time  4    Period  Weeks    Status  New    Target Date  04/25/18      PT LONG  TERM GOAL #2   Title  PT to be able to lift 20 pounds to shoulder level height without increased chest wall pain .    Time  4    Period  Weeks    Status  New             Plan - 03/28/18 1605    Clinical Impression Statement  Ms. Elem has no known injury or trauma to her left chest wall but began having constant pain with intermittent sharp pain since June of this year.  She has gone to the ER three times and has been cleared of any heart issues.  Her pain is not as severe as it was but it continues to be constant with occasional flare ups therefore she is being referred to skilled physical therapy.  Examination demonstrates normal cervical and shoulder ROM, decreased ER strength B, decreased UE functional use   and increased pain.  Ms. Witters  will benefit from skilled physical therapy to decrease her pain and improve her functional use of her arm.      Clinical Presentation  Stable    Clinical Decision Making  Low    Rehab Potential  Good    PT Frequency  2x / week    PT Duration  4 weeks    PT Treatment/Interventions  Patient/family education;Therapeutic exercise;Manual lymph drainage;Dry needling    PT Next Visit Plan  Begin postural exercises with t-band, scapular retraction, chest strech and pectorial stretches.  Give chest stretch and  pec stretches as a HEP; Continue manual to decrease pain and tightness of trap and pectoral area.        Patient will benefit from skilled therapeutic intervention in order to improve the following deficits and impairments:  Decreased activity tolerance, Decreased strength, Impaired UE functional use, Pain  Visit Diagnosis: Other symptoms and signs involving the musculoskeletal system - Plan: PT plan of care cert/re-cert  Left-sided chest wall pain - Plan: PT plan of care cert/re-cert     Problem List Patient Active Problem List   Diagnosis Date Noted  . SVD (spontaneous vaginal delivery) 02/06/2013  . Sterilization consult 01/29/2013  . Rh negative status during pregnancy 12/21/2012  . Supervision of other normal pregnancy 12/16/2012  . Late prenatal care complicating pregnancy in third trimester 12/16/2012   Rayetta Humphrey, PT CLT [phone] 03/28/2018, 4:19 PM  [facility] [address] [place], [place], [address] Phone: [phone]   Fax:  [phone]  Name: JAEDIN TRUMBO MRN: [id number] Date of Birth: December 20, 1980

## 2018-03-29 ENCOUNTER — Encounter: Payer: Self-pay | Admitting: Physician Assistant

## 2018-03-29 NOTE — Progress Notes (Signed)
Start fish oil, flaxseed, red yeast rice. Recheck lipids in 3 months.

## 2018-04-04 ENCOUNTER — Ambulatory Visit (HOSPITAL_COMMUNITY): Payer: BLUE CROSS/BLUE SHIELD | Admitting: Physical Therapy

## 2018-04-04 ENCOUNTER — Ambulatory Visit (HOSPITAL_COMMUNITY): Payer: BLUE CROSS/BLUE SHIELD

## 2018-04-04 DIAGNOSIS — R29898 Other symptoms and signs involving the musculoskeletal system: Secondary | ICD-10-CM

## 2018-04-04 DIAGNOSIS — R0789 Other chest pain: Secondary | ICD-10-CM

## 2018-04-04 NOTE — Therapy (Signed)
[facility] [address] [place], [place], [address] Phone: [phone]   Fax:  [phone]  Physical Therapy Treatment  Patient Details  Name: Theresa Sawyer MRN: [id number] Date of Birth: 01-10-1981 Referring Provider (PT): Marlise Eves McVey    Encounter Date: 04/04/2018    Past Medical History:  Diagnosis Date  . Anxiety   . High cholesterol     Past Surgical History:  Procedure Laterality Date  . NO PAST SURGERIES    . TUBAL LIGATION Bilateral 02/05/2013   Procedure: POST PARTUM TUBAL LIGATION;  Surgeon: Woodroe Mode, MD;  Location: [facility];  Service: Gynecology;  Laterality: Bilateral;    There were no vitals filed for this visit.  Subjective Assessment - 04/04/18 1655    Subjective  Pt states she's been doing her exercises.   STates she has soreness in her Lt anteiror shoulder/pec region as well as along her side.     Currently in Pain?  Yes    Pain Score  4     Pain Location  Chest    Pain Orientation  Left;Anterior    Pain Type  Chronic pain                       [facility] Adult PT Treatment/Exercise - 04/04/18 0001      Shoulder Exercises: Seated   External Rotation  Both;5 reps    Theraband Level (Shoulder External Rotation)  Level 2 (Red)    Diagonals  Strengthening;Left;5 reps    Theraband Level (Shoulder Diagonals)  Level 2 (Red)      Shoulder Exercises: Standing   Extension  Both;10 reps;Theraband    Theraband Level (Shoulder Extension)  Level 2 (Red)    Row  Both;10 reps;Theraband    Theraband Level (Shoulder Row)  Level 2 (Red)    Retraction  Both;10 reps;Theraband    Theraband Level (Shoulder Retraction)  Level 2 (Red)      Shoulder Exercises: Stretch   Corner Stretch  3 reps;30 seconds    Other Shoulder Stretches  doorway stretch 3X30"      Manual Therapy   Manual Therapy  Soft tissue mobilization    Manual therapy comments  done seperate from all other aspects of treatment     Soft  tissue mobilization  to decrease mm spasm in Lt upper trap, Lt pectoral and subaxillary region.             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     Time  2    Period  Weeks    Status  New        PT  Long Term Goals - 03/28/18 1614      PT LONG TERM GOAL #1   Title  PT to have had no chest wall pain in the past week     Time  4    Period  Weeks    Status  New    [place] Date  04/25/18      PT LONG TERM GOAL #2   Title  PT to be able to lift 20 pounds to shoulder level height without increased chest wall pain .    Time  4    Period  Weeks    Status  New            Plan - 04/04/18 1650    Clinical Impression Statement  Reveiwed evaluation and HEP issued last session.  Pt with questions regarding form and able to demonstrate therex correctly with little cues needed.  Most help needed with D1 theraband exercise.  Began standing postural strengtheing with theraband and instructed wtih pectoral stetches.  Manual completed with tightness and trigger points in subaxillary region and around in UT.  Able to reduce spasm and pain.      Rehab Potential  Good    PT Frequency  2x / week    PT Duration  4 weeks    PT Treatment/Interventions  Patient/family education;Therapeutic exercise;Manual lymph drainage;Dry needling    PT Next Visit Plan  Continue  manual to decrease pain and tightness of trap and pectoral area.  Progress postural strength.         Patient will benefit from skilled therapeutic intervention in order to improve the following deficits and impairments:  Decreased activity tolerance, Decreased strength, Impaired UE functional use, Pain  Visit Diagnosis: Other symptoms and signs involving the musculoskeletal system  Left-sided chest wall pain     Problem List Patient Active Problem List   Diagnosis Date Noted  . SVD (spontaneous vaginal delivery) 02/06/2013  . Sterilization consult 01/29/2013  . Rh negative status during pregnancy 12/21/2012  . Supervision of other normal pregnancy 12/16/2012  . Late prenatal care complicating pregnancy in third trimester 12/16/2012   Teena Irani, PTA/CLT [phone]  Teena Irani 04/04/2018, 4:58 PM  [facility] [address] [place], [place], [address] Phone: [phone]   Fax:  [phone]  Name: Theresa Sawyer MRN: [id number] Date of Birth: 10-Aug-1980

## 2018-04-04 NOTE — Telephone Encounter (Signed)
Pt no show, called and spoke to pt who thought her apt was scheduled for 2:00 this afternoon.  Found an opening at 1:00 or 5:30, pt asked for apt at 1:00.  [address] [place]; [facility] [phone]

## 2018-04-09 ENCOUNTER — Ambulatory Visit (HOSPITAL_COMMUNITY): Payer: BLUE CROSS/BLUE SHIELD | Attending: Physician Assistant | Admitting: Physical Therapy

## 2018-04-09 DIAGNOSIS — R29898 Other symptoms and signs involving the musculoskeletal system: Secondary | ICD-10-CM

## 2018-04-09 DIAGNOSIS — R0789 Other chest pain: Secondary | ICD-10-CM

## 2018-04-09 NOTE — Therapy (Signed)
[place], [place], [address] Phone: [phone]   Fax:  [phone]  Physical Therapy Treatment  Patient Details  Name: Theresa Sawyer MRN: [id number] Date of Birth: [birth year] Referring Provider (PT): Marlise Eves McVey    Encounter Date: 04/09/2018  PT End of Session - 04/09/18 1646    Visit Number  3    Number of Visits  8    Date for PT Re-Evaluation  04/27/18    Authorization - Visit Number  3    Authorization - Number of Visits  8    PT Start Time  7824    PT Stop Time  2353    PT Time Calculation (min)  41 min    Activity Tolerance  Patient tolerated treatment well    Behavior During Therapy  [facility] for tasks assessed/performed       Past Medical History:  Diagnosis Date  . Anxiety   . High cholesterol   . Sterilization consult 01/29/2013   BTL in hosp desired. Papers signed 12/16/12   . SVD (spontaneous vaginal delivery) 02/06/2013    Past Surgical History:  Procedure Laterality Date  . NO PAST SURGERIES    . TUBAL LIGATION Bilateral 02/05/2013   Procedure: POST PARTUM TUBAL LIGATION;  Surgeon: Woodroe Mode, MD;  Location: [facility];  Service: Gynecology;  Laterality: Bilateral;    There were no vitals filed for this visit.  Subjective Assessment - 04/09/18 1608    Subjective  PT states she feels more sore today, "uncomfortable" mostly in her Lt lateral trunk region below at her bra line. Pt does not know why it's increased today.      Currently in Pain?  Yes    Pain Score  5     Pain Location  Rib cage    Pain Orientation  Left;Lateral    Pain Descriptors / Indicators  Tender;Sore;Tightness                       [facility] Adult PT Treatment/Exercise - 04/09/18 0001      Shoulder Exercises: Seated   External Rotation  Both;10 reps    Theraband Level (Shoulder External Rotation)  Level 2 (Red)    Diagonals  Both;10 reps    Theraband Level (Shoulder Diagonals)  Level 2 (Red)      Shoulder Exercises: Prone   Other Prone Exercises  lying on half roll in thoracic area 2 minutes      Shoulder Exercises: Standing   Extension  Both;Theraband;15 reps    Theraband Level (Shoulder Extension)  Level 2 (Red)    Row  Both;Theraband;15 reps    Theraband Level (Shoulder Row)  Level 2 (Red)    Retraction  Both;Theraband;15 reps    Theraband Level (Shoulder Retraction)  Level 2 (Red)      Shoulder Exercises: ROM/Strengthening   Other ROM/Strengthening Exercises  thoracic extension instruction      Shoulder Exercises: Stretch   Corner Stretch  3 reps;30 seconds    Table Stretch - ABduction Limitations  against wall, wallslide 5X10"    Other Shoulder Stretches  doorway stretch 3X30"    Other Shoulder Stretches  UE flexion against wall with Rt lateral lean, hold 10 seconds 5 reps       Manual Therapy   Manual Therapy  Soft tissue mobilization    Manual therapy comments  done seperate from all other aspects of treatment     Soft tissue  mobilization  to decrease mm spasm in Lt upper trap, Lt pectoral and subaxillary region.  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     Time  2    Period  Weeks    Status  New        PT Long Term Goals - 03/28/18 1614      PT LONG TERM GOAL #1   Title  PT to have had no chest wall pain in the past week     Time  4    Period  Weeks    Status  New    [employer] Date  04/25/18      PT LONG TERM GOAL #2   Title  PT to be able to lift 20 pounds to shoulder level height without increased chest wall pain .    Time  4    Period  Weeks    Status   New            Plan - 04/09/18 1733    Clinical Impression Statement  continued with pectoral stretching and postural strengthening.  Pt able to complete in correct form with minimal cues.  Added trunk stretches to target tight lateral trunk.  spasm/tight band palpated lateral trunk at bra line around ribs.  Pt also sensitive in this area.  Had patient lay on Rt side and worked on thoracic paraspinals.  PT with trigger point and tightness located in that area that radiated pain into pec and trunk.  Instructed to lay supine on roll to encourage extension in thoracic region.  Pt reported overall improvement at end of session.      Rehab Potential  Good    PT Frequency  2x / week    PT Duration  4 weeks    PT Treatment/Interventions  Patient/family education;Therapeutic exercise;Manual lymph drainage;Dry needling    PT Next Visit Plan  Continue manual to decrease pain and tightness of trap and pectoral area.  Progress postural strength.         Patient will benefit from skilled therapeutic intervention in order to improve the following deficits and impairments:  Decreased activity tolerance, Decreased strength, Impaired UE functional use, Pain  Visit Diagnosis: Other symptoms and signs involving the musculoskeletal system  Left-sided chest wall pain     Problem List Patient Active Problem List   Diagnosis Date Noted  . SVD (spontaneous vaginal delivery) 02/06/2013  . Sterilization consult 01/29/2013  . Rh negative status during pregnancy 12/21/2012  . Supervision of other normal pregnancy 12/16/2012  . Late prenatal care complicating pregnancy in third trimester 12/16/2012   Teena Irani, PTA/CLT [phone]  Teena Irani 04/09/2018, 5:38 PM  [facility] [address] [place], [place], [address] Phone: [phone]   Fax:  [phone]  Name: Theresa Iturralde  Sawyer MRN: [id number] Date of Birth: April 17, 1981

## 2018-04-11 ENCOUNTER — Encounter: Payer: Self-pay | Admitting: Neurology

## 2018-04-11 ENCOUNTER — Ambulatory Visit: Payer: BLUE CROSS/BLUE SHIELD | Admitting: Neurology

## 2018-04-11 ENCOUNTER — Ambulatory Visit (HOSPITAL_COMMUNITY): Payer: BLUE CROSS/BLUE SHIELD | Admitting: Physical Therapy

## 2018-04-11 VITALS — BP 158/104 | HR 100 | Ht 65.0 in | Wt 216.0 lb

## 2018-04-11 DIAGNOSIS — E669 Obesity, unspecified: Secondary | ICD-10-CM | POA: Diagnosis not present

## 2018-04-11 DIAGNOSIS — R29898 Other symptoms and signs involving the musculoskeletal system: Secondary | ICD-10-CM

## 2018-04-11 DIAGNOSIS — R51 Headache: Secondary | ICD-10-CM

## 2018-04-11 DIAGNOSIS — Z820 Family history of epilepsy and other diseases of the nervous system: Secondary | ICD-10-CM | POA: Diagnosis not present

## 2018-04-11 DIAGNOSIS — G2581 Restless legs syndrome: Secondary | ICD-10-CM

## 2018-04-11 DIAGNOSIS — R351 Nocturia: Secondary | ICD-10-CM

## 2018-04-11 DIAGNOSIS — R0683 Snoring: Secondary | ICD-10-CM

## 2018-04-11 DIAGNOSIS — R519 Headache, unspecified: Secondary | ICD-10-CM

## 2018-04-11 DIAGNOSIS — G4761 Periodic limb movement disorder: Secondary | ICD-10-CM

## 2018-04-11 DIAGNOSIS — G4719 Other hypersomnia: Secondary | ICD-10-CM

## 2018-04-11 DIAGNOSIS — R0789 Other chest pain: Secondary | ICD-10-CM

## 2018-04-11 NOTE — Patient Instructions (Signed)

## 2018-04-11 NOTE — Therapy (Signed)
[place], [place], [address] Phone: [phone]   Fax:  [phone]  Physical Therapy Treatment  Patient Details  Name: Theresa Sawyer MRN: [id number] Date of Birth: 1981-06-03 Referring Provider (PT): Marlise Eves McVey    Encounter Date: 04/11/2018  PT End of Session - 04/11/18 0927    Visit Number  4    Number of Visits  8    PT Start Time  0903    PT Stop Time  0942    PT Time Calculation (min)  39 min       Past Medical History:  Diagnosis Date  . Anxiety   . High cholesterol   . Sterilization consult 01/29/2013   BTL in hosp desired. Papers signed 12/16/12   . SVD (spontaneous vaginal delivery) 02/06/2013    Past Surgical History:  Procedure Laterality Date  . NO PAST SURGERIES    . TUBAL LIGATION Bilateral 02/05/2013   Procedure: POST PARTUM TUBAL LIGATION;  Surgeon: Woodroe Mode, MD;  Location: [facility];  Service: Gynecology;  Laterality: Bilateral;    There were no vitals filed for this visit.  Subjective Assessment - 04/11/18 0903    Subjective  Pt states that overall she feels she is doing a little better she is having more soreness than pain now.      Currently in Pain?  Yes    Pain Score  4     Pain Location  Chest    Pain Orientation  Left    Pain Descriptors / Indicators  Sore;Pressure;Tightness    Pain Type  Chronic pain    Pain Onset  More than a month ago    Pain Frequency  Constant    Aggravating Factors   not sure     Pain Relieving Factors  medication /ice     Effect of Pain on Daily Activities  limits               [facility] Adult PT Treatment/Exercise - 04/11/18 0001      Exercises   Exercises  Shoulder      Shoulder Exercises: Supine   Other Supine Exercises  lie on  roll vertically down spine hold x 2'        Shoulder Exercises: Seated   Other Seated Exercises  thoracic excursion x 3       Shoulder Exercises: Prone   Retraction  Strengthening;Left;10 reps;Weights    Retraction Weight (lbs)  2    Extension  Left;10 reps    Horizontal ABduction 1  Left;10 reps      Shoulder Exercises: Sidelying   ABduction  Left;10 reps      Shoulder Exercises: Standing   Flexion  AROM;Both;5 reps    Flexion Limitations  at wall     ABduction  Both;5 reps    ABduction Limitations  at wall     Extension  --   HEP   Row  --   HEP   Retraction  --   HEP     Shoulder Exercises: ROM/Strengthening   Wall Pushups  5 reps      Shoulder Exercises: Stretch   Other Shoulder Stretches  doorway stretch 3X30"      Manual Therapy   Manual Therapy  Soft tissue mobilization    Manual therapy comments  done seperate from all other aspects of treatment     Soft tissue mobilization  to decrease mm spasm in Lt upper trap, Lt pectoral  and subaxillary region.  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     Time  2    Period  Weeks    Status  On-going        PT Long Term Goals - 04/11/18 0932      PT LONG TERM GOAL #1   Title  PT to have had no chest wall pain in the past week     Time  4    Period  Weeks    Status  On-going      PT LONG TERM GOAL #2   Title  PT to be able to lift 20 pounds to shoulder level height without increased chest wall pain .    Time  4    Period  Weeks    Status  On-going            Plan - 04/11/18 5498    Clinical Impression Statement  Pt treatment continues to focus on stretching/strengthening pectorial mm as well as increasing thoracic ROM.  Added exercises per flow sheet and updated HEP,  Noted decreased thoracic mobility with manual techniques.      Rehab Potential  Good    PT Frequency  2x / week    PT Duration  4 weeks    PT Treatment/Interventions  Patient/family education;Therapeutic exercise;Manual lymph drainage;Dry needling    PT Next Visit Plan  Continue manual to decrease pain and tightness of trap and pectoral area.  Progress postural strength.         Patient will benefit from skilled therapeutic intervention in order to improve the following deficits and impairments:  Decreased activity tolerance, Decreased strength, Impaired UE functional use, Pain  Visit Diagnosis: Other symptoms and signs involving the musculoskeletal system  Left-sided chest wall pain     Problem List Patient Active Problem List   Diagnosis Date Noted  . SVD (spontaneous vaginal delivery) 02/06/2013  . Sterilization consult 01/29/2013  . Rh negative status during pregnancy 12/21/2012  . Supervision of other normal pregnancy 12/16/2012  . Late prenatal care complicating pregnancy in third trimester 12/16/2012   Rayetta Humphrey, PT CLT [phone] 04/11/2018, 9:46 AM  [facility] [address] [place], [place], [address] Phone: [phone]   Fax:  [phone]  Name: Theresa Sawyer MRN: [id number] Date of Birth: [birth year]

## 2018-04-11 NOTE — Progress Notes (Signed)
Subjective:    Patient ID: Theresa Sawyer is a 37 y.o. female.  HPI     Theresa Sawyer  Dear Theresa Sawyer,   I saw your patient, Theresa Sawyer, upon your kind request in my clinic today for initial consultation of her sleep disorder, in particular, concern for underlying obstructive sleep apnea. The patient is unaccompanied today. As you know, Theresa Sawyer is a 37 year old right-handed woman with an underlying medical history of chest pain, anxiety, psoriasis, and psoriatic arthritis, smoking, reflux disease and obesity, who reports snoring and excessive daytime somnolence. I reviewed your office note from 02/19/2018. Her Epworth sleepiness score is 12 out of 24, fatigue score is 28 out of 63. She lives with her mother, her son and boyfriend. She smokes less than 1 ppd, no EtOH, and caffeine in the form of soda, about 3 bottles per day on average. Bedtime is around 11, sometimes earlier. Rise time around 4:30 on some days and 8:00 latest, depending on her work schedule. She works as a Educational psychologist.she has a strong family history of sleep apnea in her mother, her older brother who passed away at 88 from a massive heart attack, sister age 2, maternal grandmother and maternal uncles as well. Weight has been fairly stable. She is trying to quit smoking and reducing her caffeine intake. She has a 47-year-old goes to daycare. She has nocturia about once per average night and has had occasional morning headaches. In her early 74s she had migraine headaches. She also has a longer standing history of restless leg symptoms. In the past, she had taken iron for iron deficiency but was never really anemic. Her mother has restless leg symptoms as well. Mother takes a prescription medicine as I understand for restless leg syndrome. She has been told that she moves a lot while asleep. She has woken herself up with her legs  moving in from her own snoring. She would be willing to try CPAP therapy. She is supposed to see a cardiologist for recent issues with chest pain which are deemed musculoskeletal. She has started physical therapy and takes Flexeril at night which has been helpful.  Her Past Medical History Is Significant For: Past Medical History:  Diagnosis Date  . Anxiety   . High cholesterol   . Sterilization consult 01/29/2013   BTL in hosp desired. Papers signed 12/16/12   . SVD (spontaneous vaginal delivery) 02/06/2013    Her Past Surgical History Is Significant For: Past Surgical History:  Procedure Laterality Date  . NO PAST SURGERIES    . TUBAL LIGATION Bilateral 02/05/2013   Procedure: POST PARTUM TUBAL LIGATION;  Surgeon: Woodroe Mode, MD;  Location: [facility];  Service: Gynecology;  Laterality: Bilateral;    Her Family History Is Significant For: Family History  Problem Relation Age of Onset  . Diabetes Maternal Grandmother   . Diabetes Paternal Grandmother   . Anxiety disorder Mother   . Heart attack Brother   . Anxiety disorder Brother     Her Social History Is Significant For: Social History   Socioeconomic History  . Marital status: Legally Separated    Spouse name: Not on file  . Number of children: Not on file  . Years of education: Not on file  . Highest education level: Not on file  Occupational History  . Not on file  Social Needs  . Financial resource strain: Not on file  .  Food insecurity:    Worry: Not on file    Inability: Not on file  . Transportation needs:    Medical: Not on file    Non-medical: Not on file  Tobacco Use  . Smoking status: Current Some Day Smoker    Packs/day: 0.50    Types: Cigarettes  . Smokeless tobacco: Never Used  Substance and Sexual Activity  . Alcohol use: No  . Drug use: No  . Sexual activity: Yes    Birth control/protection: Surgical  Lifestyle  . Physical activity:    Days per week: Not on file    Minutes per session: Not  on file  . Stress: Not on file  Relationships  . Social connections:    Talks on phone: Not on file    Gets together: Not on file    Attends religious service: Not on file    Active member of club or organization: Not on file    Attends meetings of clubs or organizations: Not on file    Relationship status: Not on file  Other Topics Concern  . Not on file  Social History Narrative  . Not on file    Her Allergies Are:  No Known Allergies:   Her Current Medications Are:  Outpatient Encounter Medications as of 04/11/2018  Medication Sig  . aspirin EC 81 MG tablet Take 81 mg by mouth daily.  Marland Kitchen augmented betamethasone dipropionate (DIPROLENE-AF) 0.05 % cream Apply 1 application topically daily as needed.   . cyclobenzaprine (FLEXERIL) 10 MG tablet Take 1 tablet (10 mg total) by mouth 3 (three) times daily.  Marland Kitchen escitalopram (LEXAPRO) 10 MG tablet Take 1 tablet (10 mg total) by mouth daily. After 3 weeks, if needed may take 48m tab and 185mtab  . escitalopram (LEXAPRO) 5 MG tablet Take 5 mg by mouth daily.  . Menthol, Topical Analgesic, (ICY HOT EX) Apply 1 application topically daily as needed (for muscle aches/pain).  . methocarbamol (ROBAXIN) 500 MG tablet Take 2 tablets (1,000 mg total) by mouth 4 (four) times daily as needed for muscle spasms (muscle spasm/pain).  . Multiple Vitamins-Minerals (MULTIVITAMIN WITH MINERALS) tablet Take 1 tablet by mouth daily.  . pantoprazole (PROTONIX) 40 MG tablet Take 1 tablet (40 mg total) by mouth daily.  . [DISCONTINUED] alum & mag hydroxide-simeth (MAALOX/MYLANTA) 200-200-20 MG/5ML suspension Take 15 mLs by mouth every 6 (six) hours as needed for indigestion or heartburn.  . [DISCONTINUED] diclofenac (VOLTAREN) 75 MG EC tablet Take 1 tablet (75 mg total) by mouth 2 (two) times daily. (Patient not taking: Reported on 03/22/2018)  . [DISCONTINUED] HYDROcodone-acetaminophen (NORCO/VICODIN) 5-325 MG tablet 1 or 2 tabs PO q6 hours prn pain (Patient not  taking: Reported on 03/05/2018)  . [DISCONTINUED] ranitidine (ZANTAC) 150 MG tablet Take 150 mg by mouth daily as needed for heartburn.   No facility-administered encounter medications on file as of 04/11/2018.   :  Review of Systems:  Out of a complete 14 point review of systems, all are reviewed and negative with the exception of these symptoms as listed below:  Review of Systems  Neurological:       Pt presents today to discuss her sleep. Pt has never had a sleep study but does endorse snoring.  Epworth Sleepiness Scale 0= would never doze 1= slight chance of dozing 2= moderate chance of dozing 3= high chance of dozing  Sitting and reading: 3 Watching TV: 2 Sitting inactive in a public place (ex. Theater or meeting): 0 As  a passenger in a car for an hour without a break: 2 Lying down to rest in the afternoon: 3 Sitting and talking to someone: 0 Sitting quietly after lunch (no alcohol): 2 In a car, while stopped in traffic: 0 Total: 12     Objective:  Neurological Exam  Physical Exam Physical Examination:   Vitals:   04/11/18 1057  BP: (!) 158/104  Pulse: 100   General Examination: The patient is a very pleasant 37 y.o. female in no acute distress. She appears well-developed and well-nourished and well groomed.   HEENT: Normocephalic, atraumatic, pupils are equal, round and reactive to light and accommodation. Extraocular tracking is good without limitation to gaze excursion or nystagmus noted. Normal smooth pursuit is noted. Hearing is grossly intact. Face is symmetric with normal facial animation and normal facial sensation. Speech is clear with no dysarthria noted. There is no hypophonia. There is no lip, neck/head, jaw or voice tremor. Neck is supple with full range of passive and active motion. There are no carotid bruits on auscultation. Oropharynx exam reveals: mild mouth dryness, adequate dental hygiene and moderate airway crowding, due to smaller airway entry,  tonsils are 2+, larger appearing uvula. Mallampati is class III. Neck circumference is 16-1/2 inches. She has a minimal overbite. Tongue protrudes centrally and palate elevates symmetrically.  Chest: Clear to auscultation without wheezing, rhonchi or crackles noted.  Heart: S1+S2+0, regular and normal without murmurs, rubs or gallops noted.   Abdomen: Soft, non-tender and non-distended with normal bowel sounds appreciated on auscultation.  Extremities: There is no pitting edema in the distal lower extremities bilaterally. Pedal pulses are intact.  Skin: Warm and dry with patchy psoriatic skin changes over both elbows, little bit below the left knee cap. There are no varicose veins.  Musculoskeletal: exam reveals no obvious joint deformities, tenderness or joint swelling or erythema.   Neurologically:  Mental status: The patient is awake, alert and oriented in all 4 spheres. Her immediate and remote memory, attention, language skills and fund of knowledge are appropriate. There is no evidence of aphasia, agnosia, apraxia or anomia. Speech is clear with normal prosody and enunciation. Thought process is linear. Mood is normal and affect is normal.  Cranial nerves II - XII are as described above under HEENT exam. In addition: shoulder shrug is normal with equal shoulder height noted. Motor exam: Normal bulk, strength and tone is noted. There is no drift, tremor or rebound. Romberg is negative. Reflexes are 2+ throughout. Fine motor skills and coordination: intact grossly.  Cerebellar testing: No dysmetria or intention tremor. There is no truncal or gait ataxia.  Sensory exam: intact to light touch in the upper and lower extremities.  Gait, station and balance: She stands easily. No veering to one side is noted. No leaning to one side is noted. Posture is age-appropriate and stance is narrow based. Gait shows normal stride length and normal pace. No problems turning are noted. Tandem walk is  unremarkable.   Assessment and Plan:  In summary, SATARA VIRELLA is a very pleasant 37 y.o.-year old female with an underlying medical history of chest pain, anxiety, psoriasis, and psoriatic arthritis, smoking, reflux disease and obesity, whose  history and physical exam are concerning for obstructive sleep apnea (OSA). She also endorses restless leg symptoms and we will be on the look out for PLMS during her sleep study. I had a long chat with the patient about my findings and the diagnosis of OSA, its prognosis and treatment options.  We talked about medical treatments, surgical interventions and non-pharmacological approaches. I explained in particular the risks and ramifications of untreated moderate to severe OSA, especially with respect to developing cardiovascular disease down the Road, including congestive heart failure, difficult to treat hypertension, cardiac arrhythmias, or stroke. Even type 2 diabetes has, in part, been linked to untreated OSA. Symptoms of untreated OSA include daytime sleepiness, memory problems, mood irritability and mood disorder such as depression and anxiety, lack of energy, as well as recurrent headaches, especially morning headaches. We talked about smoking cessation and trying to maintain a healthy lifestyle in general, as well as the importance of weight control. I encouraged the patient to eat healthy, exercise daily and keep well hydrated, to keep a scheduled bedtime and wake time routine, to not skip any meals and eat healthy snacks in between meals. I advised the patient not to drive when feeling sleepy. I recommended the following at this time: sleep study with potential positive airway pressure titration. (We will score hypopneas at 3%).   I explained the sleep test procedure to the patient and also outlined possible surgical and non-surgical treatment options of OSA, including the use of a custom-made dental device (which would require a referral to a specialist  dentist or oral surgeon), upper airway surgical options, such as pillar implants, radiofrequency surgery, tongue base surgery, and UPPP (which would involve a referral to an ENT surgeon). Rarely, jaw surgery such as mandibular advancement may be considered.  I also explained the CPAP treatment option to the patient, who indicated that she would be willing to try CPAP if the need arises. I explained the importance of being compliant with PAP treatment, not only for insurance purposes but primarily to improve Her symptoms, and for the patient's long term health benefit, including to reduce Her cardiovascular risks. I answered all her questions today and the patient was in agreement. I plan to see her back after the sleep study is completed and encouraged her to call with any interim questions, concerns, problems or updates.   Thank you very much for allowing me to participate in the care of this nice patient. If I can be of any further assistance to you please do not hesitate to call me at [phone].  Sincerely,   Theresa Age, MD, PhD

## 2018-04-15 ENCOUNTER — Telehealth: Payer: Self-pay

## 2018-04-15 DIAGNOSIS — G4719 Other hypersomnia: Secondary | ICD-10-CM

## 2018-04-15 NOTE — Telephone Encounter (Signed)
VO for HST from Dr. Rexene Alberts received. HST order placed.

## 2018-04-15 NOTE — Telephone Encounter (Signed)
[employer] denied in lab sleep study, Need HST order

## 2018-04-16 ENCOUNTER — Telehealth (HOSPITAL_COMMUNITY): Payer: Self-pay | Admitting: Physician Assistant

## 2018-04-16 ENCOUNTER — Ambulatory Visit (HOSPITAL_COMMUNITY): Payer: BLUE CROSS/BLUE SHIELD | Admitting: Physical Therapy

## 2018-04-16 NOTE — Telephone Encounter (Signed)
04/16/18  pt cx she is stuck at work so she can't get here today

## 2018-04-18 ENCOUNTER — Ambulatory Visit (HOSPITAL_COMMUNITY): Payer: BLUE CROSS/BLUE SHIELD | Admitting: Physical Therapy

## 2018-04-18 ENCOUNTER — Other Ambulatory Visit: Payer: Self-pay | Admitting: Physician Assistant

## 2018-04-18 ENCOUNTER — Encounter (HOSPITAL_COMMUNITY): Payer: Self-pay | Admitting: Physical Therapy

## 2018-04-18 DIAGNOSIS — R0789 Other chest pain: Secondary | ICD-10-CM

## 2018-04-18 DIAGNOSIS — R29898 Other symptoms and signs involving the musculoskeletal system: Secondary | ICD-10-CM | POA: Diagnosis not present

## 2018-04-18 NOTE — Therapy (Signed)
[place], [place], [address] Phone: [phone]   Fax:  [phone]  Physical Therapy Treatment  Patient Details  Name: Theresa Sawyer MRN: [id number] Date of Birth: Jan 11, 1981 Referring Provider (PT): Marlise Eves McVey    Encounter Date: 04/18/2018  PT End of Session - 04/18/18 0921    Visit Number  5    Number of Visits  8    PT Start Time  0905    PT Stop Time  0945    PT Time Calculation (min)  40 min    Activity Tolerance  Patient tolerated treatment well       Past Medical History:  Diagnosis Date  . Anxiety   . High cholesterol   . Sterilization consult 01/29/2013   BTL in hosp desired. Papers signed 12/16/12   . SVD (spontaneous vaginal delivery) 02/06/2013    Past Surgical History:  Procedure Laterality Date  . NO PAST SURGERIES    . TUBAL LIGATION Bilateral 02/05/2013   Procedure: POST PARTUM TUBAL LIGATION;  Surgeon: Woodroe Mode, MD;  Location: [facility];  Service: Gynecology;  Laterality: Bilateral;    There were no vitals filed for this visit.  Subjective Assessment - 04/18/18 0904    Subjective  PT states that she was having some pain yesterday in her subaxillary area but did some contrast heat/cold and it felt better.   Back to work fulltime modifing difficult tasks.     Limitations  Lifting;House hold activities    Currently in Pain?  No/denies    Pain Onset  More than a month ago          [facility] Adult PT Treatment/Exercise - 04/18/18 0001      Exercises   Exercises  Shoulder      Shoulder Exercises: Seated   Other Seated Exercises  thoracic excursion x 2    Other Seated Exercises  thoracic stretch against the foam in chair.        Shoulder Exercises: Prone   Retraction  Strengthening;Left;10 reps;Weights    Retraction Weight (lbs)  2    Extension  Left;10 reps    Extension Weight (lbs)  2    Horizontal ABduction 1  Left;10 reps    Horizontal ABduction 1 Weight (lbs)  2      Shoulder Exercises: Sidelying   ABduction  Left;10 reps    ABduction Weight (lbs)  2      Shoulder Exercises: Standing   Other Standing Exercises  counter push up x 10      Manual to both Left pecs and lats to decrease tension; manual completed separate from all other aspects of  Treatment.     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     Time  2    Period  Weeks    Status  On-going        PT Long Term Goals - 04/11/18 0932      PT LONG TERM  GOAL #1   Title  PT to have had no chest wall pain in the past week     Time  4    Period  Weeks    Status  On-going      PT LONG TERM GOAL #2   Title  PT to be able to lift 20 pounds to shoulder level height without increased chest wall pain .    Time  4    Period  Weeks    Status  On-going            Plan - 04/18/18 0924    Clinical Impression Statement  Added wt to program.  Pt pain decreasing but still having difficulty at work when she has to lift greater than five pounds.  PT will continue to benefit from skilled therapy to address functional activity limitations.       Rehab Potential  Good    PT Frequency  2x / week    PT Duration  4 weeks    PT Treatment/Interventions  Patient/family education;Therapeutic exercise;Manual lymph drainage;Dry needling    PT Next Visit Plan  Continue manual to decrease pain and tightness of trap and pectoral area.  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        Patient will benefit from skilled therapeutic intervention in order to improve the following deficits and impairments:  Decreased activity tolerance, Decreased strength, Impaired UE  functional use, Pain  Visit Diagnosis: Other symptoms and signs involving the musculoskeletal system  Left-sided chest wall pain     Problem List Patient Active Problem List   Diagnosis Date Noted  . SVD (spontaneous vaginal delivery) 02/06/2013  . Sterilization consult 01/29/2013  . Rh negative status during pregnancy 12/21/2012  . Supervision of other normal pregnancy 12/16/2012  . Late prenatal care complicating pregnancy in third trimester 12/16/2012    Rayetta Humphrey, PT CLT [phone] 04/18/2018, 9:47 AM  [facility] [address] [place], [place], [address] Phone: [phone]   Fax:  [phone]  Name: Theresa Sawyer MRN: [id number] Date of Birth: Jun 08, 1981

## 2018-04-19 NOTE — Telephone Encounter (Signed)
Requested medication (s) are due for refill today: Yes  Requested medication (s) are on the active medication list: Yes  Last refill:  03/15/18  Future visit scheduled: No  Notes to clinic:  See request    Requested Prescriptions  Pending Prescriptions Disp Refills   cyclobenzaprine (FLEXERIL) 10 MG tablet [Pharmacy Med Name: CYCLOBENZAPRINE 10MG TABLETS] 30 tablet 0    Sig: TAKE 1 TABLET(10 MG) BY MOUTH THREE TIMES DAILY     Not Delegated - Analgesics:  Muscle Relaxants Failed - 04/18/2018  7:26 PM      Failed - This refill cannot be delegated      Passed - Valid encounter within last 6 months    Recent Outpatient Visits          4 weeks ago Generalized anxiety disorder   Primary Care at [facility], [place], PA-C   1 month ago Chest wall pain   Primary Care at [facility], Gelene Mink, PA-C   1 month ago Atypical chest pain   Primary Care at [facility], Gelene Mink, [place]

## 2018-04-22 ENCOUNTER — Telehealth (HOSPITAL_COMMUNITY): Payer: Self-pay | Admitting: Physician Assistant

## 2018-04-22 NOTE — Telephone Encounter (Signed)
04/22/18  pt cx because she has a cardiologist appt at 2:30 and its in [facility]

## 2018-04-23 ENCOUNTER — Ambulatory Visit (HOSPITAL_COMMUNITY): Payer: BLUE CROSS/BLUE SHIELD | Admitting: Physical Therapy

## 2018-04-23 ENCOUNTER — Ambulatory Visit (INDEPENDENT_AMBULATORY_CARE_PROVIDER_SITE_OTHER): Payer: BLUE CROSS/BLUE SHIELD | Admitting: Interventional Cardiology

## 2018-04-23 ENCOUNTER — Encounter: Payer: Self-pay | Admitting: Interventional Cardiology

## 2018-04-23 VITALS — BP 148/86 | HR 98 | Ht 65.0 in | Wt 217.4 lb

## 2018-04-23 DIAGNOSIS — E781 Pure hyperglyceridemia: Secondary | ICD-10-CM

## 2018-04-23 DIAGNOSIS — Z720 Tobacco use: Secondary | ICD-10-CM | POA: Diagnosis not present

## 2018-04-23 DIAGNOSIS — R0789 Other chest pain: Secondary | ICD-10-CM

## 2018-04-23 DIAGNOSIS — R072 Precordial pain: Secondary | ICD-10-CM | POA: Diagnosis not present

## 2018-04-23 DIAGNOSIS — Z01812 Encounter for preprocedural laboratory examination: Secondary | ICD-10-CM | POA: Diagnosis not present

## 2018-04-23 MED ORDER — METOPROLOL TARTRATE 100 MG PO TABS: ORAL_TABLET | ORAL | 0 refills | Status: DC

## 2018-04-23 NOTE — Progress Notes (Signed)
Cardiology Office Note   Date:  04/23/2018   ID:  Theresa Sawyer, DOB [birth year], MRN [id number]  PCP:  Dorise Hiss, PA-C    No chief complaint on file.  Chest pain  Wt Readings from Last 3 Encounters:  04/23/18 217 lb 6.4 oz (98.6 kg)  04/11/18 216 lb (98 kg)  03/22/18 216 lb (98 kg)       History of Present Illness: Theresa Sawyer is a 37 y.o. female who was seen in any pain emergency department in August 2019.  She had chest pain.  She was told she had chest wall pain and was given anti-inflammatory and muscle relaxant therapy.  She has had several visits to emergency rooms for chest discomfort.  She has had associated left arm pain as well.   She has a brother who had an MI at age 61 and died suddenly in his sleep.    She has started chest wall therapy.  THis has helped, except for the last session when she was more sore afterwards.  She was started on PPI for this as well.  This has helped as well.   She continues to smoke.    Denies :  Dizziness. Leg edema. Nitroglycerin use. Orthopnea. Palpitations. Paroxysmal nocturnal dyspnea. Shortness of breath. Syncope.   She walks at work and plays with her 39 y/o.  No chest pain associated with this activity.   Past Medical History:  Diagnosis Date  . Anxiety   . High cholesterol   . Sterilization consult 01/29/2013   BTL in hosp desired. Papers signed 12/16/12   . SVD (spontaneous vaginal delivery) 02/06/2013    Past Surgical History:  Procedure Laterality Date  . NO PAST SURGERIES    . TUBAL LIGATION Bilateral 02/05/2013   Procedure: POST PARTUM TUBAL LIGATION;  Surgeon: Woodroe Mode, MD;  Location: [facility];  Service: Gynecology;  Laterality: Bilateral;     Current Outpatient Medications  Medication Sig Dispense Refill  . aspirin EC 81 MG tablet Take 81 mg by mouth daily.    Marland Kitchen augmented betamethasone dipropionate (DIPROLENE-AF) 0.05 % cream Apply 1 application topically daily as needed.   2  .  cyclobenzaprine (FLEXERIL) 10 MG tablet Take 1 tablet (10 mg total) by mouth 3 (three) times daily. 30 tablet 1  . escitalopram (LEXAPRO) 10 MG tablet Take 1 tablet (10 mg total) by mouth daily. After 3 weeks, if needed may take 26m tab and 122mtab 30 tablet 3  . escitalopram (LEXAPRO) 5 MG tablet Take 5 mg by mouth daily.    . Menthol, Topical Analgesic, (ICY HOT EX) Apply 1 application topically daily as needed (for muscle aches/pain).    . Multiple Vitamins-Minerals (MULTIVITAMIN WITH MINERALS) tablet Take 1 tablet by mouth daily.    . pantoprazole (PROTONIX) 40 MG tablet Take 1 tablet (40 mg total) by mouth daily. 60 tablet 0   No current facility-administered medications for this visit.     Allergies:   Patient has no known allergies.    Social History:  The patient  reports that she has been smoking cigarettes. She has been smoking about 0.50 packs per day. She has never used smokeless tobacco. She reports that she does not drink alcohol or use drugs.   Family History:  The patient's family history includes Anxiety disorder in her brother and mother; Diabetes in her maternal grandmother and paternal grandmother; Heart attack in her brother.    ROS:  Please see the  history of present illness.   Otherwise, review of systems are positive for chest pain.   All other systems are reviewed and negative.    PHYSICAL EXAM: VS:  BP (!) 148/86   Pulse 98   Ht 5' 5"  (1.651 m)   Wt 217 lb 6.4 oz (98.6 kg)   SpO2 97%   BMI 36.18 kg/m  , BMI Body mass index is 36.18 kg/m. GEN: Well nourished, well developed, in no acute distress  HEENT: normal  Neck: no JVD, carotid bruits, or masses Cardiac: RRR; no murmurs, rubs, or gallops,no edema  Respiratory:  clear to auscultation bilaterally, normal work of breathing GI: soft, nontender, nondistended, + BS MS: no deformity or atrophy  Skin: warm and dry, no rash Neuro:  Strength and sensation are intact Psych: euthymic mood, full  affect   EKG:   The ekg ordered today demonstrates NSR, no ST changes   Recent Labs: 02/22/2018: BUN 9; Creatinine, Ser 0.81; Hemoglobin 13.4; Platelets 372; Potassium 3.5; Sodium 138   Lipid Panel    Component Value Date/Time   CHOL 181 03/22/2018 1628   TRIG 335 (H) 03/22/2018 1628   HDL 34 (L) 03/22/2018 1628   CHOLHDL 5.3 (H) 03/22/2018 1628   LDLCALC 80 03/22/2018 1628     Other studies Reviewed: Additional studies/ records that were reviewed today with results demonstrating: .   ASSESSMENT AND PLAN:  1. Chest pain: Multiple visits to ER for atypical chest pain.  Plan for CTA of the coronaries to r/o CAD.  A negative result will be reassuring to her.  2. Hypertriglyceridemia: Continue attempts at healthy diet and regular exercise to help bring down triglycerides.  Reduce sugar intake.  Avoid fried foods. 3. Smoking: She needs to stop smoking.    Current medicines are reviewed at length with the patient today.  The patient concerns regarding her medicines were addressed.  The following changes have been made:  No change  Labs/ tests ordered today include:  No orders of the defined types were placed in this encounter.   Recommend 150 minutes/week of aerobic exercise Low fat, low carb, high fiber diet recommended  Disposition:   FU prn   Signed, Larae Grooms, MD  04/23/2018 2:10 PM    [facility] [place], [place], [place]  [address] Phone: [phone]; Fax: [phone]

## 2018-04-23 NOTE — Patient Instructions (Signed)
Medication Instructions:  Your physician recommends that you continue on your current medications as directed. Please refer to the Current Medication list given to you today.  If you need a refill on your cardiac medications before your next appointment, please call your pharmacy.   Lab work: TODAY: BMET  If you have labs (blood work) drawn today and your tests are completely normal, you will receive your results only by: Marland Kitchen MyChart Message (if you have MyChart) OR . A paper copy in the mail If you have any lab test that is abnormal or we need to change your treatment, we will call you to review the results.  Testing/Procedures: Your physician has requested that you have cardiac CT. Cardiac computed tomography (CT) is a painless test that uses an x-ray machine to take clear, detailed pictures of your heart. For further information please visit [email or web address][facility][email or web address]. Please follow instruction sheet as given.   Follow-Up: . AS NEEDED  Any Other Special Instructions Will Be Listed Below (If Applicable).  Please arrive at the [facility] main entrance of [facility] at xx:xx AM (30-45 minutes prior to test start time)   [facility] [place], [place] [address] [phone]  Proceed to the [facility] Radiology Department (First Floor).  Please follow these instructions carefully (unless otherwise directed):    On the Night Before the Test: . Be sure to Drink plenty of water. . Do not consume any caffeinated/decaffeinated beverages or chocolate 12 hours prior to your test. . Do not take any antihistamines 12 hours prior to your test.  On the Day of the Test: . Drink plenty of water. Do not drink any water within one hour of the test. . Do not eat any food 4 hours prior to the test. . You may take your regular medications prior to the test.  . Take metoprolol (Lopressor) two hours prior to test.      After the  Test: . Drink plenty of water. . After receiving IV contrast, you may experience a mild flushed feeling. This is normal. . On occasion, you may experience a mild rash up to 24 hours after the test. This is not dangerous. If this occurs, you can take Benadryl 25 mg and increase your fluid intake. . If you experience trouble breathing, this can be serious. If it is severe call 911 IMMEDIATELY. If it is mild, please call our office.

## 2018-04-24 LAB — BASIC METABOLIC PANEL
BUN/Creatinine Ratio: 15 (ref 9–23)
BUN: 11 mg/dL (ref 6–20)
CO2: 21 mmol/L (ref 20–29)
CREATININE: 0.74 mg/dL (ref 0.57–1.00)
Calcium: 9.3 mg/dL (ref 8.7–10.2)
Chloride: 104 mmol/L (ref 96–106)
GFR, EST AFRICAN AMERICAN: 119 mL/min/{1.73_m2} (ref >59)
GFR, EST NON AFRICAN AMERICAN: 103 mL/min/{1.73_m2} (ref >59)
Glucose: 114 mg/dL — ABNORMAL HIGH (ref 65–99)
Potassium: 3.8 mmol/L (ref 3.5–5.2)
SODIUM: 140 mmol/L (ref 134–144)

## 2018-04-25 ENCOUNTER — Ambulatory Visit (HOSPITAL_COMMUNITY): Payer: BLUE CROSS/BLUE SHIELD | Admitting: Physical Therapy

## 2018-04-25 DIAGNOSIS — R0789 Other chest pain: Secondary | ICD-10-CM

## 2018-04-25 DIAGNOSIS — R29898 Other symptoms and signs involving the musculoskeletal system: Secondary | ICD-10-CM

## 2018-04-25 NOTE — Therapy (Signed)
[place], [place], [address] Phone: [phone]   Fax:  [phone]  Physical Therapy Treatment  Patient Details  Name: Theresa Sawyer MRN: [id number] Date of Birth: Nov 09, 1980 Referring Provider (PT): Marlise Eves McVey    Encounter Date: 04/25/2018  PT End of Session - 04/25/18 1756    Visit Number  6    Number of Visits  8    PT Start Time  1612    PT Stop Time  1636    PT Time Calculation (min)  24 min    Activity Tolerance  Patient tolerated treatment well       Past Medical History:  Diagnosis Date  . Anxiety   . High cholesterol   . Sterilization consult 01/29/2013   BTL in hosp desired. Papers signed 12/16/12   . SVD (spontaneous vaginal delivery) 02/06/2013    Past Surgical History:  Procedure Laterality Date  . NO PAST SURGERIES    . TUBAL LIGATION Bilateral 02/05/2013   Procedure: POST PARTUM TUBAL LIGATION;  Surgeon: Woodroe Mode, MD;  Location: [facility];  Service: Gynecology;  Laterality: Bilateral;    There were no vitals filed for this visit.  Subjective Assessment - 04/25/18 1627    Subjective  Pt was late for appt and stated she had to leave early when she arrived.  States she went to the cardiologist on Tuesdsay and they've ordered CT scan of her heart.  States she's still having spasms in the Lt axillary and is concerned with the "knot" Lt lateral trunk region.    Currently in Pain?  Yes    Pain Score  3     Pain Location  Axilla    Pain Orientation  Left    Pain Descriptors / Indicators  Sore                       [facility] Adult PT Treatment/Exercise - 04/25/18 0001      Shoulder Exercises: Prone   Retraction  Strengthening;Left;10 reps;Weights    Retraction Weight (lbs)  2    Extension  Left;10 reps    Extension Weight (lbs)  2    Horizontal ABduction 1  Left;10 reps    Horizontal ABduction 1 Weight (lbs)  2      Shoulder Exercises: Standing   Other Standing  Exercises  reviewed theraband as pateint with questions on form.        Shoulder Exercises: ROM/Strengthening   Wall Pushups  10 reps   reviewed for form     Manual Therapy   Manual Therapy  Soft tissue mobilization    Manual therapy comments  done seperate from all other aspects of treatment     Soft tissue mobilization  to decrease mm  in Lt upper trap/scap, Lt pectoral and subaxillary region.  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     Time  2    Period  Weeks    Status  On-going        PT Long Term Goals - 04/11/18 0932      PT LONG TERM GOAL #1   Title  PT to have had no chest wall pain in the past week     Time  4    Period  Weeks    Status  On-going      PT LONG TERM GOAL #2   Title  PT to be able to lift 20 pounds to shoulder level height without increased chest wall pain .    Time  4    Period  Weeks    Status  On-going            Plan - 04/25/18 1756    Clinical Impression Statement  Reviewed therex pateint had questions on, insured completed in correct form.  Pt did need corrections with theraband exercises but all others were completed correctly.  Manual completed for LT lateral trunk/scap region with noted tightness/restrictons.  Pt reported overall improvement following.  Pt requested to leave early today due to having to pick her child up from school.      Rehab Potential  Good    PT Frequency  2x / week    PT Duration  4 weeks    PT Treatment/Interventions  Patient/family education;Therapeutic exercise;Manual lymph drainage;Dry needling    PT Next Visit Plan  Continue manual to  decrease pain and tightness of trap and pectoral area.  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        Patient will benefit from skilled therapeutic intervention in order to improve the following deficits and impairments:  Decreased activity tolerance, Decreased strength, Impaired UE functional use, Pain  Visit Diagnosis: Other symptoms and signs involving the musculoskeletal system  Left-sided chest wall pain     Problem List Patient Active Problem List   Diagnosis Date Noted  . SVD (spontaneous vaginal delivery) 02/06/2013  . Sterilization consult 01/29/2013  . Rh negative status during pregnancy 12/21/2012  . Supervision of other normal pregnancy 12/16/2012  . Late prenatal care complicating pregnancy in third trimester 12/16/2012   Teena Irani, PTA/CLT [phone]  Teena Irani 04/25/2018, 6:02 PM  [facility] [address] [place], [place], [address] Phone: [phone]   Fax:  [phone]  Name: Theresa Sawyer MRN: [id number] Date of Birth: 01-21-1981

## 2018-04-29 ENCOUNTER — Telehealth (HOSPITAL_COMMUNITY): Payer: Self-pay | Admitting: Physical Therapy

## 2018-04-29 ENCOUNTER — Telehealth (HOSPITAL_COMMUNITY): Payer: Self-pay | Admitting: Physician Assistant

## 2018-04-29 ENCOUNTER — Ambulatory Visit: Payer: Self-pay | Admitting: *Deleted

## 2018-04-29 NOTE — Telephone Encounter (Signed)
04/29/18  I have left 2 messages to offer a 9:00 appt to patient on 10/22.  She had called earlier in the day to come in in the AM on 04/30/18

## 2018-04-29 NOTE — Telephone Encounter (Signed)
Patient is calling to report that she has been doing PT for chest wall pain- she reports she has been having increasing muscle spasms in her chest/shoulder that is increasing in pain. She is using Ibuprofen and ice/heat and that is not helping.Appointment for evaluation of pain Reason for Disposition . Muscle aches are a chronic symptom (recurrent or ongoing AND present > 4 weeks)  Answer Assessment - Initial Assessment Questions 1. ONSET: "When did the muscle aches or body pains start?"      Spasms are still present- and last week and over the week end they were more severe 2. LOCATION: "What part of your body is hurting?" (e.g., entire body, arms, legs)      Left chest and left shoulder, almost feels a little swollen and sore- PT is very painful 3. SEVERITY: "How bad is the pain?" (Scale 1-10; or mild, moderate, severe)   - MILD (1-3): doesn't interfere with normal activities    - MODERATE (4-7): interferes with normal activities or awakens from sleep    - SEVERE (8-10):  excruciating pain, unable to do any normal activities      Pain lasts after the spasm- patient is using ice and heat- 4 today, last night- 8 4. CAUSE: "What do you think is causing the pains?"     Muscle spasms 5. FEVER: "Have you been having fever?"     no 6. OTHER SYMPTOMS: "Do you have any other symptoms?" (e.g., chest pain, weakness, rash, cold or flu symptoms, weight loss)     no 7. PREGNANCY: "Is there any chance you are pregnant?" "When was your last menstrual period?"     No- LMP- begining of month (BTL) 8. TRAVEL: "Have you traveled out of the country in the last month?" (e.g., travel history, exposures)     n/a  Protocols used: MUSCLE ACHES AND BODY PAIN-A-AH

## 2018-04-29 NOTE — Telephone Encounter (Signed)
Pt called to confirm change in apptment.for 04/30/18. NF 04/29/18

## 2018-04-30 ENCOUNTER — Ambulatory Visit (INDEPENDENT_AMBULATORY_CARE_PROVIDER_SITE_OTHER): Payer: BLUE CROSS/BLUE SHIELD | Admitting: Physician Assistant

## 2018-04-30 ENCOUNTER — Encounter (HOSPITAL_COMMUNITY): Payer: Self-pay | Admitting: Physical Therapy

## 2018-04-30 ENCOUNTER — Encounter: Payer: Self-pay | Admitting: Physician Assistant

## 2018-04-30 ENCOUNTER — Other Ambulatory Visit: Payer: Self-pay

## 2018-04-30 ENCOUNTER — Ambulatory Visit (INDEPENDENT_AMBULATORY_CARE_PROVIDER_SITE_OTHER): Payer: BLUE CROSS/BLUE SHIELD

## 2018-04-30 ENCOUNTER — Ambulatory Visit (HOSPITAL_COMMUNITY): Payer: BLUE CROSS/BLUE SHIELD | Admitting: Physical Therapy

## 2018-04-30 VITALS — BP 120/94 | HR 99 | Temp 97.9°F | Resp 16 | Ht 65.5 in | Wt 221.0 lb

## 2018-04-30 DIAGNOSIS — M791 Myalgia, unspecified site: Secondary | ICD-10-CM | POA: Diagnosis not present

## 2018-04-30 DIAGNOSIS — R29898 Other symptoms and signs involving the musculoskeletal system: Secondary | ICD-10-CM

## 2018-04-30 DIAGNOSIS — R0789 Other chest pain: Secondary | ICD-10-CM

## 2018-04-30 DIAGNOSIS — M4184 Other forms of scoliosis, thoracic region: Secondary | ICD-10-CM | POA: Diagnosis not present

## 2018-04-30 MED ORDER — CYCLOBENZAPRINE HCL 10 MG PO TABS: 10.0000 mg | ORAL_TABLET | Freq: Three times a day (TID) | ORAL | 1 refills | Status: DC

## 2018-04-30 NOTE — Progress Notes (Signed)
Theresa Sawyer  MRN: [id number] DOB: January 30, 1981  PCP: Dorise Hiss, PA-C  Subjective:  Pt is a 38 year old female who presents to clinic for chest wall spasm.  This is not a new problem for this patient.  She was referred to PT on 9/6. Her last PT appt was today.  She is feeling 50% better. She is doing home exercises. "It helps". She alternates bt heat and ice.  Pain still does hit her sometimes  Muscle spasms happen more at night.  Flexeril PRN is helping. Only takes this "when it gets bad".   Review of Systems  Respiratory: Negative for cough, chest tightness and shortness of breath.   Cardiovascular: Positive for chest pain (chest wall). Negative for palpitations and leg swelling.  Musculoskeletal: Negative for arthralgias and myalgias.  Skin: Negative.     Patient Active Problem List   Diagnosis Date Noted  . SVD (spontaneous vaginal delivery) 02/06/2013  . Sterilization consult 01/29/2013  . Rh negative status during pregnancy 12/21/2012  . Supervision of other normal pregnancy 12/16/2012  . Late prenatal care complicating pregnancy in third trimester 12/16/2012    Current Outpatient Medications on File Prior to Visit  Medication Sig Dispense Refill  . aspirin EC 81 MG tablet Take 81 mg by mouth daily.    Marland Kitchen augmented betamethasone dipropionate (DIPROLENE-AF) 0.05 % cream Apply 1 application topically daily as needed.   2  . escitalopram (LEXAPRO) 10 MG tablet Take 1 tablet (10 mg total) by mouth daily. After 3 weeks, if needed may take 45m tab and 142mtab 30 tablet 3  . escitalopram (LEXAPRO) 5 MG tablet Take 5 mg by mouth daily.    . Menthol, Topical Analgesic, (ICY HOT EX) Apply 1 application topically daily as needed (for muscle aches/pain).    . Multiple Vitamins-Minerals (MULTIVITAMIN WITH MINERALS) tablet Take 1 tablet by mouth daily.    . pantoprazole (PROTONIX) 40 MG tablet Take 1 tablet (40 mg total) by mouth daily. 60 tablet 0  . cyclobenzaprine  (FLEXERIL) 10 MG tablet Take 1 tablet (10 mg total) by mouth 3 (three) times daily. (Patient not taking: Reported on 04/30/2018) 30 tablet 1  . metoprolol tartrate (LOPRESSOR) 100 MG tablet Take 1 tablet by mouth 2 hours prior to Cardiac CT (Patient not taking: Reported on 04/30/2018) 1 tablet 0   No current facility-administered medications on file prior to visit.     No Known Allergies   Objective:  BP (!) 120/94 (BP Location: Left Arm, Patient Position: Sitting, Cuff Size: Normal)   Pulse 99   Temp 97.9 F (36.6 C) (Oral)   Resp 16   Ht 5' 5.5" (1.664 m)   Wt 221 lb (100.2 kg)   LMP 04/09/2018   SpO2 97%   BMI 36.22 kg/m   Physical Exam  Constitutional: She appears well-developed and well-nourished.  Pulmonary/Chest: She exhibits no crepitus, no edema, no deformity and no swelling.  TTP in areas of documentation.     Musculoskeletal:       Back:  Vitals reviewed.  Dg Thoracic Spine 2 View  Result Date: 04/30/2018 CLINICAL DATA:  Axillary and chest pain. EXAM: THORACIC SPINE 2 VIEWS COMPARISON:  03/05/2018. FINDINGS: Mild scoliosis. No acute or focal bony abnormality identified. No evidence of fracture. Paraspinal soft tissues are normal. IMPRESSION: Mild scoliosis.  No acute or focal bony abnormality identified. Electronically Signed   By: ThMarcello MooresRegister   On: 04/30/2018 14:27    Assessment and Plan :  1. Chest wall pain 2. Pain in the muscles - pt presents for f/u chest wall pain. Recent PT session provided about 40% relief of pain. She is doing home exercises. T-spine x-ray shows mild scoliosis, otherwise is negative. con't flexeril as needed. Alternate heat and ice.  - DG Thoracic Spine 2 View; Future - cyclobenzaprine (FLEXERIL) 10 MG tablet; Take 1 tablet (10 mg total) by mouth 3 (three) times daily.  Dispense: 30 tablet; Refill: 1   Whitney Margel Joens, PA-C  [facility] 04/30/2018 1:35 PM  Please note: Portions of this  report may have been transcribed using dragon voice recognition software. Every effort was made to ensure accuracy; however, inadvertent computerized transcription errors may be present.

## 2018-04-30 NOTE — Therapy (Signed)
[place], [place], [address] Phone: [phone]   Fax:  [phone]  Physical Therapy Treatment/Discharge  Patient Details  Name: Theresa Sawyer MRN: [id number] Date of Birth: [birth year] Referring Provider (PT): Marlise Eves McVey    Encounter Date: 04/30/2018  PHYSICAL THERAPY DISCHARGE SUMMARY  Visits from Start of Care: 7  Current functional level related to goals / functional outcomes: Pt has full ROM, functional strength    Remaining deficits: Pain that moves   Education / Equipment: HEP Plan: Patient agrees to discharge.  Patient goals were partially met. Patient is being discharged due to lack of progress.  ?????      PT End of Session - 04/30/18 0923    Visit Number  7    Number of Visits  7    PT Start Time  0903    PT Stop Time  0943    PT Time Calculation (min)  40 min    Activity Tolerance  Patient tolerated treatment well       Past Medical History:  Diagnosis Date  . Anxiety   . High cholesterol   . Sterilization consult 01/29/2013   BTL in hosp desired. Papers signed 12/16/12   . SVD (spontaneous vaginal delivery) 02/06/2013    Past Surgical History:  Procedure Laterality Date  . NO PAST SURGERIES    . TUBAL LIGATION Bilateral 02/05/2013   Procedure: POST PARTUM TUBAL LIGATION;  Surgeon: Woodroe Mode, MD;  Location: [facility];  Service: Gynecology;  Laterality: Bilateral;    There were no vitals filed for this visit.  Subjective Assessment - 04/30/18 0903    Subjective  PT states that she is still sore; Having pain in the LT side more than the pectoral area now.      Limitations  Lifting;House hold activities    Currently in Pain?  Yes    Pain Score  4     Pain Orientation  Left    Pain Descriptors / Indicators  Tightness;Pressure    Pain Type  Chronic pain    Pain Onset  More than a month ago    Pain Frequency  Constant    Aggravating Factors   not sure     Pain Relieving  Factors  stretching, ice pack, heat     Effect of Pain on Daily Activities  limits          [facility] PT Assessment - 04/30/18 0001      Assessment   Medical Diagnosis  LT chest wall pain     Referring Provider (PT)  Marlise Eves McVey     Onset Date/Surgical Date  02/12/18    Hand Dominance  Right    Next MD Visit  05/08/2018    Prior Therapy  none       Precautions   Precautions  None      Restrictions   Weight Bearing Restrictions  No      Balance Screen   Has the patient fallen in the past 6 months  No    Has the patient had a decrease in activity level because of a fear of falling?   Yes    Is the patient reluctant to leave their home because of a fear of falling?   No      Home Film/video editor residence      Prior Function   Level of [place]  Full  time employment    Barista     Leisure  none       Cognition   Overall Cognitive Status  Within Functional Limits for tasks assessed      AROM   Cervical Flexion  WFL    Cervical Extension  WFL    Cervical - Right Side Bend  WFL   complain of tightness at end range    Cervical - Left Side Bend  WFL    Cervical - Right Rotation  WFL    Cervical - Left Rotation  WFL   c/o increased tightness     Strength   Overall Strength Comments  ER  4+/5 was 4-/5 B all other shoulder wnl; cervical wnl       Palpation   Palpation comment  tender to Lt upper trap with increased tension                    [facility] Adult PT Treatment/Exercise - 04/30/18 0001      Exercises   Exercises  Shoulder      Shoulder Exercises: Prone   Retraction  Strengthening;Left;15 reps;Weights    Retraction Weight (lbs)  2    Extension  Left;15 reps    Extension Weight (lbs)  2      Shoulder Exercises: Sidelying   ABduction  Left;15 reps    ABduction Weight (lbs)  2      Shoulder Exercises: Standing   Other Standing Exercises  Side bend x 5; counter stretch  x 5; door stretch(grab top of door with LT UE and lean into it)  x 5      Shoulder Exercises: ROM/Strengthening   Lat Pull  3 plate;10 reps    Wall Pushups  10 reps      Manual Therapy   Manual Therapy  Soft tissue mobilization    Manual therapy comments  done seperate from all other aspects of treatment     Soft tissue mobilization  to decrease mm  in Lt upper trap/scap, Lt pectoral and subaxillary region.  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      PT SHORT TERM GOAL #2   Title  PT to be able to lift 10 pounds to shoulder height without any chest pain.     Time  2    Period  Weeks    Status  Achieved        PT Long Term Goals - 04/30/18 0934      PT LONG TERM GOAL #1   Title  PT to have had no chest wall pain in the past week     Time  4    Period  Weeks    Status  On-going      PT LONG TERM GOAL #2   Title  PT to be able to lift 20 pounds to shoulder level height without increased chest wall pain .    Time  4    Period  Weeks    Status  On-going            Plan - 04/30/18 7741    Clinical Impression Statement  PT returning to MD today.  PT ROM and strength are all wnl.  Pt has no mm spasm in area of complaint.  Pt will be discharged from therapy as pt is I in HEp for both strengthening and stretching of pectorial and Latissimuss mm.    Clinical Presentation  Stable    Rehab Potential  Good    PT Frequency  2x / week    PT Duration  4 weeks    PT Treatment/Interventions  Patient/family education;Therapeutic exercise;Manual lymph drainage;Dry needling    PT Next Visit Plan  Discharge patient to HEP.  PT is completing lifting at work.         Patient will benefit from skilled therapeutic intervention in order to improve the following deficits and impairments:  Decreased activity tolerance, Decreased strength, Impaired UE functional use, Pain  Visit Diagnosis: Other symptoms and signs involving the musculoskeletal system  Left-sided chest wall pain     Problem List Patient Active Problem List   Diagnosis Date Noted  . SVD (spontaneous vaginal delivery) 02/06/2013  . Sterilization consult 01/29/2013  . Rh negative status during pregnancy 12/21/2012  . Supervision of other normal pregnancy 12/16/2012  . Late prenatal care complicating pregnancy in third trimester 12/16/2012    Rayetta Humphrey, PT CLT [phone] 04/30/2018, 9:45 AM  [facility] [address] [place], [place], [address] Phone: [phone]   Fax:  [phone]  Name: BREALYN BARIL MRN: [id number] Date of Birth: [birth year]

## 2018-04-30 NOTE — Patient Instructions (Addendum)
Your x-ray shows mild scoliosis.  No bony abnormality identified. Take flexeril as needed.  Continue home PT  Keeping You Healthy   Get These Tests  Blood Pressure- Have your blood pressure checked by your healthcare provider at least once a year.  Normal blood pressure is 120/80.  Weight- Have your body mass index (BMI) calculated to screen for obesity.  BMI is a measure of body fat based on height and weight.  You can calculate your own BMI at [employer].it  Cholesterol- Have your cholesterol checked every year.  Diabetes- Have your blood sugar checked every year if you have high blood pressure, high cholesterol, a family history of diabetes or if you are overweight.  Pap Test - Have a pap test every 1 to 5 years if you have been sexually active.  If you are older than 65 and recent pap tests have been normal you may not need additional pap tests.  In addition, if you have had a hysterectomy  for benign disease additional pap tests are not necessary.  Mammogram-Yearly mammograms are essential for early detection of breast cancer  Screening for Colon Cancer- Colonoscopy starting at age 43. Screening may begin sooner depending on your family history and other health conditions.  Follow up colonoscopy as directed by your Gastroenterologist.  Screening for Osteoporosis- Screening begins at age 98 with bone density scanning, sooner if you are at higher risk for developing Osteoporosis.   Get these medicines  Calcium with Vitamin D- Your body requires 1200-1500 mg of Calcium a day and [phone] IU of Vitamin D a day.  You can only absorb 500 mg of Calcium at a time therefore Calcium must be taken in 2 or 3 separate doses throughout the day.  Hormones- Hormone therapy has been associated with increased risk for certain cancers and heart disease.  Talk to your healthcare provider about if you need relief from menopausal symptoms.  Aspirin- Ask your healthcare provider about  taking Aspirin to prevent Heart Disease and Stroke.   Get these Immuniztions  Flu shot- Every fall  Pneumonia shot- Once after the age of 33; if you are younger ask your healthcare provider if you need a pneumonia shot.  Tetanus- Every ten years.  Zostavax- Once after the age of 22 to prevent shingles.   Take these steps  Don't smoke- Your healthcare provider can help you quit. For tips on how to quit, ask your healthcare provider or go to www.smokefree.gov or call 1-800 QUIT-NOW.  Be physically active- Exercise 5 days a week for a minimum of 30 minutes.  If you are not already physically active, start slow and gradually work up to 30 minutes of moderate physical activity.  Try walking, dancing, bike riding, swimming, etc.  Eat a healthy diet- Eat a variety of healthy foods such as fruits, vegetables, whole grains, low fat milk, low fat cheeses, yogurt, lean meats, chicken, fish, eggs, dried beans, tofu, etc.  For more information go to www.thenutritionsource.org  Dental visit- Brush and floss teeth twice daily; visit your dentist twice a year.  Eye exam- Visit your Optometrist or Ophthalmologist yearly.  Drink alcohol in moderation- Limit alcohol intake to one drink or less a day.  Never drink and drive.  Depression- Your emotional health is as important as your physical health.  If you're feeling down or losing interest in things you normally enjoy, please talk to your healthcare provider.  Seat Belts- can save your life; always wear one  Smoke/Carbon Monoxide detectors- These  detectors need to be installed on the appropriate level of your home.  Replace batteries at least once a year.  Violence- If anyone is threatening or hurting you, please tell your healthcare provider.  Living Will/ Health care power of attorney- Discuss with your healthcare provider and family.  Thank you for coming in today. I hope you feel we met your needs.  Feel free to call PCP if you have any  questions or further requests.  Please consider signing up for MyChart if you do not already have it, as this is a great way to communicate with me.  Best,  Whitney McVey, PA-C   IF you received an x-ray today, you will receive an invoice from [facility]. Please contact [facility] at [phone] with questions or concerns regarding your invoice.   IF you received labwork today, you will receive an invoice from [facility]. Please contact [facility] at [phone] with questions or concerns regarding your invoice.   Our billing staff will not be able to assist you with questions regarding bills from these companies.  You will be contacted with the lab results as soon as they are available. The fastest way to get your results is to activate your My Chart account. Instructions are located on the last page of this paperwork. If you have not heard from [place] regarding the results in 2 weeks, please contact this office.

## 2018-05-02 ENCOUNTER — Telehealth: Payer: Self-pay | Admitting: Physician Assistant

## 2018-05-02 ENCOUNTER — Ambulatory Visit (HOSPITAL_COMMUNITY): Payer: BLUE CROSS/BLUE SHIELD

## 2018-05-02 NOTE — Telephone Encounter (Signed)
Called and spoke with pt. Cancelled appt for 07/05/18 due to provider schedule change. We will make another appt for her when she comes for her appt on 05/15/18.

## 2018-05-07 ENCOUNTER — Encounter (HOSPITAL_COMMUNITY): Payer: BLUE CROSS/BLUE SHIELD | Admitting: Physical Therapy

## 2018-05-09 ENCOUNTER — Encounter (HOSPITAL_COMMUNITY): Payer: BLUE CROSS/BLUE SHIELD | Admitting: Physical Therapy

## 2018-05-15 ENCOUNTER — Encounter

## 2018-05-15 ENCOUNTER — Ambulatory Visit: Payer: BLUE CROSS/BLUE SHIELD | Admitting: Physician Assistant

## 2018-05-19 ENCOUNTER — Other Ambulatory Visit: Payer: Self-pay | Admitting: Physician Assistant

## 2018-05-19 DIAGNOSIS — K219 Gastro-esophageal reflux disease without esophagitis: Secondary | ICD-10-CM

## 2018-06-28 ENCOUNTER — Encounter: Payer: Self-pay | Admitting: Interventional Cardiology

## 2018-07-04 ENCOUNTER — Other Ambulatory Visit: Payer: Self-pay | Admitting: Physician Assistant

## 2018-07-04 DIAGNOSIS — F411 Generalized anxiety disorder: Secondary | ICD-10-CM

## 2018-07-04 NOTE — Telephone Encounter (Signed)
Requested medication (s) are due for refill today: Amount not specified  Requested medication (s) are on the active medication list: yes    Last refill: 04/11/18  Future visit scheduled no  Notes to clinic:historical provider  Requested Prescriptions  Pending Prescriptions Disp Refills   escitalopram (LEXAPRO) 5 MG tablet [Pharmacy Med Name: ESCITALOPRAM 5MG TABLETS] 30 tablet     Sig: TAKE 1 TABLET BY MOUTH EVERY DAY, MAY INCREASE TO 2 TABLETS AFTER 3 WEEKS IF NEEDED     Psychiatry:  Antidepressants - SSRI Passed - 07/04/2018  9:10 AM      Passed - Valid encounter within last 6 months    Recent Outpatient Visits          2 months ago Chest wall pain   [facility] at [facility], [place], PA-C   3 months ago Generalized anxiety disorder   [facility] at [facility], [place], PA-C   3 months ago Chest wall pain   [facility] at [facility], Gelene Mink, PA-C   4 months ago Atypical chest pain   Primary Care at [facility], [place], [place]           Signed Prescriptions Disp Refills   escitalopram (LEXAPRO) 10 MG tablet 90 tablet 0    Sig: TAKE 1 TABLET BY MOUTH ONCE DAILY. AFTER 3 WEEKS, MAY TAKE ADDITIONAL 5MG TABLET DAILY     Psychiatry:  Antidepressants - SSRI Passed - 07/04/2018  9:10 AM      Passed - Valid encounter within last 6 months    Recent Outpatient Visits          2 months ago Chest wall pain   [facility] at [facility], Gelene Mink, PA-C   3 months ago Generalized anxiety disorder   [facility] at [facility], [place], PA-C   3 months ago Chest wall pain   [facility] at [facility], Gelene Mink, PA-C   4 months ago Atypical chest pain   Primary Care at [facility], Gelene Mink, [place]

## 2018-07-05 ENCOUNTER — Ambulatory Visit: Payer: BLUE CROSS/BLUE SHIELD | Admitting: Physician Assistant

## 2018-07-09 DIAGNOSIS — J019 Acute sinusitis, unspecified: Secondary | ICD-10-CM | POA: Diagnosis not present

## 2018-08-20 ENCOUNTER — Telehealth: Payer: Self-pay | Admitting: Neurology

## 2018-08-20 NOTE — Telephone Encounter (Signed)
We have attempted to call the patient 2 times to schedule sleep study. Patient has been unavailable at the phone numbers we have on file and has not returned our calls. At this point we will send a letter asking pt to please contact the sleep lab to schedule their sleep study. If patient calls back we will schedule them for their sleep study.

## 2018-08-22 ENCOUNTER — Ambulatory Visit: Payer: BLUE CROSS/BLUE SHIELD | Admitting: Emergency Medicine

## 2018-08-22 ENCOUNTER — Encounter: Payer: Self-pay | Admitting: Emergency Medicine

## 2018-08-22 ENCOUNTER — Encounter: Payer: Self-pay | Admitting: Gastroenterology

## 2018-08-22 ENCOUNTER — Other Ambulatory Visit: Payer: Self-pay

## 2018-08-22 VITALS — BP 144/97 | HR 82 | Temp 99.3°F | Resp 16 | Ht 65.25 in | Wt 227.8 lb

## 2018-08-22 DIAGNOSIS — L409 Psoriasis, unspecified: Secondary | ICD-10-CM | POA: Diagnosis not present

## 2018-08-22 DIAGNOSIS — R0789 Other chest pain: Secondary | ICD-10-CM | POA: Diagnosis not present

## 2018-08-22 DIAGNOSIS — M940 Chondrocostal junction syndrome [Tietze]: Secondary | ICD-10-CM

## 2018-08-22 DIAGNOSIS — K219 Gastro-esophageal reflux disease without esophagitis: Secondary | ICD-10-CM

## 2018-08-22 MED ORDER — BETAMETHASONE DIPROPIONATE AUG 0.05 % EX CREA: 1.0000 "application " | TOPICAL_CREAM | Freq: Every day | CUTANEOUS | 2 refills | Status: DC | PRN

## 2018-08-22 NOTE — Progress Notes (Signed)
Theresa Sawyer 38 y.o.   Chief Complaint  Patient presents with  . Gastroesophageal Reflux    with pain on the left side and has gotten worsen at times, per patient seen 04/30/2018 by [place]: This is a 38 y.o. female with several complaints today: 1.  Chronic left-sided chest pain.  Has been to the emergency room.  Negative work-up including EKGs, blood work, x-rays.  Constant sharp pain, worse with movement, better with ice and rest.  Most likely related to work related duties. 2.  GERD symptoms since last summer.  Has been on Protonix with some relief.  Certain foods make it worse.  No GI evaluation yet. 3.  Psoriasis.  Dermatology appointment coming up.  Starting to develop joint pains as well. 4.  Mild flulike symptoms yesterday.  Better today.  HPI   Prior to Admission medications   Medication Sig Start Date End Date Taking? Authorizing Provider  aspirin EC 81 MG tablet Take 81 mg by mouth daily.   Yes [provider]  escitalopram (LEXAPRO) 10 MG tablet TAKE 1 TABLET BY MOUTH ONCE DAILY. AFTER 3 WEEKS, MAY TAKE ADDITIONAL 5MG TABLET DAILY 07/04/18  Yes McVey, Gelene Mink, PA-C  escitalopram (LEXAPRO) 5 MG tablet TAKE 1 TABLET BY MOUTH EVERY DAY, MAY INCREASE TO 2 TABLETS AFTER 3 WEEKS IF NEEDED 07/05/18  Yes McVey, Gelene Mink, PA-C  Multiple Vitamins-Minerals (MULTIVITAMIN WITH MINERALS) tablet Take 1 tablet by mouth daily.   Yes [provider]  pantoprazole (PROTONIX) 40 MG tablet TAKE 1 TABLET(40 MG) BY MOUTH DAILY 05/20/18  Yes McVey, Gelene Mink, PA-C  augmented betamethasone dipropionate (DIPROLENE-AF) 0.05 % cream Apply 1 application topically daily as needed.  12/13/17   [provider]  cyclobenzaprine (FLEXERIL) 10 MG tablet Take 1 tablet (10 mg total) by mouth 3 (three) times daily. Patient not taking: Reported on 08/22/2018 04/30/18   McVey, Gelene Mink, PA-C  Menthol, Topical Analgesic,  (ICY HOT EX) Apply 1 application topically daily as needed (for muscle aches/pain).    [provider]  metoprolol tartrate (LOPRESSOR) 100 MG tablet Take 1 tablet by mouth 2 hours prior to Cardiac CT Patient not taking: Reported on 08/22/2018 04/23/18   Jettie Booze, MD    No Known Allergies  There are no active problems to display for this patient.   Past Medical History:  Diagnosis Date  . Anxiety   . High cholesterol   . Sterilization consult 01/29/2013   BTL in hosp desired. Papers signed 12/16/12   . SVD (spontaneous vaginal delivery) 02/06/2013    Past Surgical History:  Procedure Laterality Date  . NO PAST SURGERIES    . TUBAL LIGATION Bilateral 02/05/2013   Procedure: POST PARTUM TUBAL LIGATION;  Surgeon: Woodroe Mode, MD;  Location: [facility];  Service: Gynecology;  Laterality: Bilateral;    Social History   Socioeconomic History  . Marital status: Legally Separated    Spouse name: Not on file  . Number of children: Not on file  . Years of education: Not on file  . Highest education level: Not on file  Occupational History  . Not on file  Social Needs  . Financial resource strain: Not on file  . Food insecurity:    Worry: Not on file    Inability: Not on file  . Transportation needs:    Medical: Not on file    Non-medical: Not on file  Tobacco Use  . Smoking  status: Current Some Day Smoker    Packs/day: 0.50    Types: Cigarettes  . Smokeless tobacco: Never Used  Substance and Sexual Activity  . Alcohol use: No  . Drug use: No  . Sexual activity: Yes    Birth control/protection: Surgical  Lifestyle  . Physical activity:    Days per week: Not on file    Minutes per session: Not on file  . Stress: Not on file  Relationships  . Social connections:    Talks on phone: Not on file    Gets together: Not on file    Attends religious service: Not on file    Active member of club or organization: Not on file    Attends meetings of clubs or  organizations: Not on file    Relationship status: Not on file  . Intimate partner violence:    Fear of current or ex partner: Not on file    Emotionally abused: Not on file    Physically abused: Not on file    Forced sexual activity: Not on file  Other Topics Concern  . Not on file  Social History Narrative  . Not on file    Family History  Problem Relation Age of Onset  . Diabetes Maternal Grandmother   . Diabetes Paternal Grandmother   . Anxiety disorder Mother   . Heart attack Brother   . Anxiety disorder Brother      Review of Systems  Constitutional: Negative.  Negative for chills and fever.  HENT: Negative.   Eyes: Negative.   Respiratory: Negative.  Negative for cough, hemoptysis, sputum production and shortness of breath.   Cardiovascular: Negative.  Negative for chest pain, palpitations and leg swelling.  Gastrointestinal: Negative.  Negative for abdominal pain, blood in stool, melena, nausea and vomiting.  Genitourinary: Negative.   Musculoskeletal:       Left chest wall pain  Skin: Positive for rash.  Neurological: Negative.  Negative for dizziness and headaches.  Endo/Heme/Allergies: Negative.   All other systems reviewed and are negative.   Vitals:   08/22/18 0924  BP: (!) 144/97  Pulse: 82  Resp: 16  Temp: 99.3 F (37.4 C)  SpO2: 96%    Physical Exam Vitals signs reviewed.  Constitutional:      Appearance: She is obese.  HENT:     Head: Normocephalic and atraumatic.     Mouth/Throat:     Mouth: Mucous membranes are moist.     Pharynx: Oropharynx is clear.  Eyes:     Extraocular Movements: Extraocular movements intact.     Conjunctiva/sclera: Conjunctivae normal.     Pupils: Pupils are equal, round, and reactive to light.  Neck:     Musculoskeletal: Normal range of motion and neck supple.  Cardiovascular:     Rate and Rhythm: Normal rate and regular rhythm.     Heart sounds: Normal heart sounds.  Pulmonary:     Effort: Pulmonary effort  is normal.     Breath sounds: Normal breath sounds.  Chest:     Chest wall: Tenderness (Left lateral area) present.  Abdominal:     Palpations: Abdomen is soft.     Tenderness: There is no abdominal tenderness.  Musculoskeletal: Normal range of motion.  Skin:    General: Skin is warm and dry.     Capillary Refill: Capillary refill takes less than 2 seconds.     Comments: Positive psoriasis patches on elbows and knees  Neurological:     General: No  focal deficit present.     Mental Status: She is alert and oriented to person, place, and time.  Psychiatric:        Mood and Affect: Mood normal.        Behavior: Behavior normal.      ASSESSMENT & PLAN: Dannae was seen today for gastroesophageal reflux.  Diagnoses and all orders for this visit:  Chest wall pain  Costochondritis  Gastroesophageal reflux disease without esophagitis -     Ambulatory referral to Gastroenterology  Psoriasis -     augmented betamethasone dipropionate (DIPROLENE-AF) 0.05 % cream; Apply 1 application topically daily as needed.    Patient Instructions       If you have lab work done today you will be contacted with your lab results within the next 2 weeks.  If you have not heard from [place] then please contact us. The fastest way to get your results is to register for My Chart.   IF you received an x-ray today, you will receive an invoice from [facility]. Please contact [facility] at [phone] with questions or concerns regarding your invoice.   IF you received labwork today, you will receive an invoice from [place]. Please contact [facility] at [phone] with questions or concerns regarding your invoice.   Our billing staff will not be able to assist you with questions regarding bills from these companies.  You will be contacted with the lab results as soon as they are available. The fastest way to get your results is to activate your My Chart account. Instructions are  located on the last page of this paperwork. If you have not heard from [place] regarding the results in 2 weeks, please contact this office.     Costochondritis Costochondritis is swelling and irritation (inflammation) of the tissue (cartilage) that connects your ribs to your breastbone (sternum). This causes pain in the front of your chest. Usually, the pain:  Starts gradually.  Is in more than one rib. This condition usually goes away on its own over time. Follow these instructions at home:  Do not do anything that makes your pain worse.  If directed, put ice on the painful area: ? Put ice in a plastic bag. ? Place a towel between your skin and the bag. ? Leave the ice on for 20 minutes, 2-3 times a day.  If directed, put heat on the affected area as often as told by your doctor. Use the heat source that your doctor tells you to use, such as a moist heat pack or a heating pad. ? Place a towel between your skin and the heat source. ? Leave the heat on for 20-30 minutes. ? Take off the heat if your skin turns bright red. This is very important if you cannot feel pain, heat, or cold. You may have a greater risk of getting burned.  Take over-the-counter and prescription medicines only as told by your doctor.  Return to your normal activities as told by your doctor. Ask your doctor what activities are safe for you.  Keep all follow-up visits as told by your doctor. This is important. Contact a doctor if:  You have chills or a fever.  Your pain does not go away or it gets worse.  You have a cough that does not go away. Get help right away if:  You are short of breath. This information is not intended to replace advice given to you by your health care provider. Make sure you discuss any  questions you have with your health care provider. Document Released: 12/13/2007 Document Revised: 01/14/2016 Document Reviewed: 10/20/2015 Elsevier Interactive Patient Education  2019 Elsevier  Inc.      Agustina Caroli, MD [facility]

## 2018-08-22 NOTE — Patient Instructions (Addendum)
     If you have lab work done today you will be contacted with your lab results within the next 2 weeks.  If you have not heard from [place] then please contact us. The fastest way to get your results is to register for My Chart.   IF you received an x-ray today, you will receive an invoice from [facility]. Please contact [facility] at [phone] with questions or concerns regarding your invoice.   IF you received labwork today, you will receive an invoice from [place]. Please contact [facility] at [phone] with questions or concerns regarding your invoice.   Our billing staff will not be able to assist you with questions regarding bills from these companies.  You will be contacted with the lab results as soon as they are available. The fastest way to get your results is to activate your My Chart account. Instructions are located on the last page of this paperwork. If you have not heard from [place] regarding the results in 2 weeks, please contact this office.     Costochondritis Costochondritis is swelling and irritation (inflammation) of the tissue (cartilage) that connects your ribs to your breastbone (sternum). This causes pain in the front of your chest. Usually, the pain:  Starts gradually.  Is in more than one rib. This condition usually goes away on its own over time. Follow these instructions at home:  Do not do anything that makes your pain worse.  If directed, put ice on the painful area: ? Put ice in a plastic bag. ? Place a towel between your skin and the bag. ? Leave the ice on for 20 minutes, 2-3 times a day.  If directed, put heat on the affected area as often as told by your doctor. Use the heat source that your doctor tells you to use, such as a moist heat pack or a heating pad. ? Place a towel between your skin and the heat source. ? Leave the heat on for 20-30 minutes. ? Take off the heat if your skin turns bright red. This is very important if  you cannot feel pain, heat, or cold. You may have a greater risk of getting burned.  Take over-the-counter and prescription medicines only as told by your doctor.  Return to your normal activities as told by your doctor. Ask your doctor what activities are safe for you.  Keep all follow-up visits as told by your doctor. This is important. Contact a doctor if:  You have chills or a fever.  Your pain does not go away or it gets worse.  You have a cough that does not go away. Get help right away if:  You are short of breath. This information is not intended to replace advice given to you by your health care provider. Make sure you discuss any questions you have with your health care provider. Document Released: 12/13/2007 Document Revised: 01/14/2016 Document Reviewed: 10/20/2015 Elsevier Interactive Patient Education  2019 [employer].

## 2018-08-28 DIAGNOSIS — L400 Psoriasis vulgaris: Secondary | ICD-10-CM | POA: Diagnosis not present

## 2018-09-02 ENCOUNTER — Ambulatory Visit: Payer: BLUE CROSS/BLUE SHIELD | Admitting: Neurology

## 2018-09-02 DIAGNOSIS — G4719 Other hypersomnia: Secondary | ICD-10-CM

## 2018-09-02 DIAGNOSIS — G4733 Obstructive sleep apnea (adult) (pediatric): Secondary | ICD-10-CM

## 2018-09-03 DIAGNOSIS — L400 Psoriasis vulgaris: Secondary | ICD-10-CM | POA: Diagnosis not present

## 2018-09-04 ENCOUNTER — Telehealth: Payer: Self-pay

## 2018-09-04 NOTE — Telephone Encounter (Signed)
I called pt to discuss her sleep study results. No answer, left a message asking her to call me back. 

## 2018-09-04 NOTE — Addendum Note (Signed)
Addended by: Star Age on: 09/04/2018 07:57 AM   Modules accepted: Orders

## 2018-09-04 NOTE — Progress Notes (Signed)
Patient referred by Juanda Crumble, PA, seen by me on 04/11/18, HST on 09/02/18.    Please call and notify the patient that the recent home sleep test showed obstructive sleep apnea in the moderate range. While I recommend treatment for this in the form CPAP, her insurance will not approve a sleep study for this. They will likely only approve a trial of autoPAP, which means, that we don't have to bring her in for a sleep study with CPAP, but will let her try an autoPAP machine at home, through a DME company (of her choice, or as per insurance requirement). The DME representative will educate her on how to use the machine, how to put the mask on, etc. I have placed an order in the chart. Please send referral, talk to patient, send report to referring MD. We will need a FU in sleep clinic for 10 weeks post-PAP set up, please arrange that with me or one of our NPs. Thanks,   Star Age, MD, PhD [facility])

## 2018-09-04 NOTE — Procedures (Signed)
[facility] @[facility] [place],  [address] NAME:  Theresa Sawyer                                                                        DOB: February 26, 1981 MEDICAL RECORD no: [id number]                                                DOS: 09/02/2018 REFERRING PHYSICIAN: Gelene Mink, PA-C STUDY PERFORMED: HST on [employer] HISTORY: 38 year old woman with a history of chest pain, anxiety, psoriasis, and psoriatic arthritis, smoking, reflux disease and obesity, who reports snoring and excessive daytime somnolence. Her Epworth sleepiness score is 12 out of 24, BMI of 37.6. STUDY RESULTS:   Total Recording Time: 7 hrs, 26 mins; Total Sleep Time: 5 hrs, 57 mins Total Apnea/Hypopnea Index (AHI): 21.0/h; RDI:  23.4/h; REM AHI: 14.5/h Average Oxygen Saturation:  97 %; Lowest Oxygen Desaturation:  87 %  Total Time Oxygen Saturation Below or at 88 %: 0.3 minutes  Average Heart Rate: 77 bpm (between 48 and 110 bpm) IMPRESSION: OSA RECOMMENDATION: This home sleep test demonstrates moderate obstructive sleep apnea with a total AHI of 21/hour and O2 nadir of 87%. Given the patient's medical history and sleep related complaints, treatment with positive airway pressure (in the form of CPAP) is recommended. This will require a full night CPAP titration study for proper treatment settings, O2 monitoring and mask fitting. Based on the severity of the sleep disordered breathing an attended titration study is indicated. However, patient's insurance has denied an attended sleep study; therefore, the patient will be advised to proceed with an autoPAP titration/trial at home for now. Please note that untreated obstructive sleep apnea may carry additional perioperative morbidity. Patients with significant obstructive sleep apnea should receive perioperative PAP therapy and the surgeons and particularly the anesthesiologist should be informed of the diagnosis and the severity of the  sleep disordered breathing. The patient should be cautioned not to drive, work at heights, or operate dangerous or heavy equipment when tired or sleepy. Review and reiteration of good sleep hygiene measures should be pursued with any patient. Other causes of the patient's symptoms, including circadian rhythm disturbances, an underlying mood disorder, medication effect and/or an underlying medical problem cannot be ruled out based on this test. Clinical correlation is recommended. The patient and her referring provider will be notified of the test results. The patient will be seen in follow up in sleep clinic at [facility]. I certify that I have reviewed the raw data recording prior to the issuance of this report in accordance with the standards of the American Academy of Sleep Medicine (AASM).  Star Age, MD, PhD [facility]) Diplomat, ABPN (Neurology and Sleep)

## 2018-09-04 NOTE — Telephone Encounter (Signed)
-----   Message from Star Age, MD sent at 09/04/2018  7:57 AM EST ----- Patient referred by Juanda Crumble, PA, seen by me on 04/11/18, HST on 09/02/18.    Please call and notify the patient that the recent home sleep test showed obstructive sleep apnea in the moderate range. While I recommend treatment for this in the form CPAP, her insurance will not approve a sleep study for this. They will likely only approve a trial of autoPAP, which means, that we don't have to bring her in for a sleep study with CPAP, but will let her try an autoPAP machine at home, through a DME company (of her choice, or as per insurance requirement). The DME representative will educate her on how to use the machine, how to put the mask on, etc. I have placed an order in the chart. Please send referral, talk to patient, send report to referring MD. We will need a FU in sleep clinic for 10 weeks post-PAP set up, please arrange that with me or one of our NPs. Thanks,   Star Age, MD, PhD [facility])

## 2018-09-05 NOTE — Telephone Encounter (Signed)
Pt has returned the call to [employer], she is asking for a call back

## 2018-09-05 NOTE — Telephone Encounter (Signed)
I called Theresa Sawyer. I advised Theresa Sawyer that Dr. Rexene Alberts reviewed their sleep study results and found that Theresa Sawyer has moderate osa. Dr. Rexene Alberts recommends that Theresa Sawyer start an auto pap at home since Theresa Sawyer's insurance will likely deny an in lab titration study. I reviewed PAP compliance expectations with the Theresa Sawyer. Theresa Sawyer is agreeable to starting an auto-PAP. I advised Theresa Sawyer that an order will be sent to a DME, [facility], and [facility] will call the Theresa Sawyer within about one week after they file with the Theresa Sawyer's insurance. [facility] will show the Theresa Sawyer how to use the machine, fit for masks, and troubleshoot the auto-PAP if needed. A follow up appt was made for insurance purposes with Janett Billow, NP on 12/05/2018 at 7:45am. Theresa Sawyer verbalized understanding to arrive 15 minutes early and bring their auto-PAP. A letter with all of this information in it will be mailed to the Theresa Sawyer as a reminder. I verified with the Theresa Sawyer that the address we have on file is correct. Theresa Sawyer verbalized understanding of results. Theresa Sawyer had no questions at this time but was encouraged to call back if questions arise. I have sent the order to [facility] and have received confirmation that they have received the order.

## 2018-09-09 ENCOUNTER — Ambulatory Visit: Payer: BLUE CROSS/BLUE SHIELD | Admitting: Gastroenterology

## 2018-09-09 NOTE — Progress Notes (Deleted)
Referring Provider: Horald Sawyer, * Primary Care Physician:  Patient, [name]   Reason for Consultation: Reflux   IMPRESSION:  ***  PLAN: ***   HPI: Theresa Sawyer is a 38 y.o. female seen in consultation at the request of Dr. Mitchel Sawyer for further evaluation of reflux.  The history is obtained through the patient and review of her electronic health record.  GERD symptoms since last summer.  Described as a burning in her throat.  Associated belching.  Intermittent left upper quadrant pain. Has been on Mylanta, Zantac, and Protonix with some relief.  Certain foods make it worse.  Internal exacerbation when supine.   Sleeping on 2 pillows.  Avoids eating late.  No prior GI evaluation or endoscopy.  Mother has GERD.   H. pylori breath test was -03/22/2018.  Past Medical History:  Diagnosis Date  . Anxiety   . High cholesterol   . Sterilization consult 01/29/2013   BTL in hosp desired. Papers signed 12/16/12   . SVD (spontaneous vaginal delivery) 02/06/2013    Past Surgical History:  Procedure Laterality Date  . NO PAST SURGERIES    . TUBAL LIGATION Bilateral 02/05/2013   Procedure: POST PARTUM TUBAL LIGATION;  Surgeon: Theresa Mode, MD;  Location: [facility];  Service: Gynecology;  Laterality: Bilateral;    Current Outpatient Medications  Medication Sig Dispense Refill  . aspirin EC 81 MG tablet Take 81 mg by mouth daily.    Marland Kitchen augmented betamethasone dipropionate (DIPROLENE-AF) 0.05 % cream Apply 1 application topically daily as needed. 30 g 2  . cyclobenzaprine (FLEXERIL) 10 MG tablet Take 1 tablet (10 mg total) by mouth 3 (three) times daily. (Patient not taking: Reported on 08/22/2018) 30 tablet 1  . escitalopram (LEXAPRO) 10 MG tablet TAKE 1 TABLET BY MOUTH ONCE DAILY. AFTER 3 WEEKS, MAY TAKE ADDITIONAL 5MG TABLET DAILY 90 tablet 0  . escitalopram (LEXAPRO) 5 MG tablet TAKE 1 TABLET BY MOUTH EVERY DAY, MAY INCREASE TO 2 TABLETS AFTER 3 WEEKS IF NEEDED 90 tablet 0  .  Menthol, Topical Analgesic, (ICY HOT EX) Apply 1 application topically daily as needed (for muscle aches/pain).    . metoprolol tartrate (LOPRESSOR) 100 MG tablet Take 1 tablet by mouth 2 hours prior to Cardiac CT (Patient not taking: Reported on 08/22/2018) 1 tablet 0  . Multiple Vitamins-Minerals (MULTIVITAMIN WITH MINERALS) tablet Take 1 tablet by mouth daily.    . pantoprazole (PROTONIX) 40 MG tablet TAKE 1 TABLET(40 MG) BY MOUTH DAILY 90 tablet 3   No current facility-administered medications for this visit.     Allergies as of 09/09/2018  . (No Known Allergies)    Family History  Problem Relation Age of Onset  . Diabetes Maternal Grandmother   . Diabetes Paternal Grandmother   . Anxiety disorder Mother   . Heart attack Brother   . Anxiety disorder Brother     Social History   Socioeconomic History  . Marital status: Legally Separated    Spouse name: Not on file  . Number of children: Not on file  . Years of education: Not on file  . Highest education level: Not on file  Occupational History  . Not on file  Social Needs  . Financial resource strain: Not on file  . Food insecurity:    Worry: Not on file    Inability: Not on file  . Transportation needs:    Medical: Not on file    Non-medical: Not on file  Tobacco Use  . Smoking status: Current Some Day Smoker    Packs/day: 0.50    Types: Cigarettes  . Smokeless tobacco: Never Used  Substance and Sexual Activity  . Alcohol use: No  . Drug use: No  . Sexual activity: Yes    Birth control/protection: Surgical  Lifestyle  . Physical activity:    Days per week: Not on file    Minutes per session: Not on file  . Stress: Not on file  Relationships  . Social connections:    Talks on phone: Not on file    Gets together: Not on file    Attends religious service: Not on file    Active member of club or organization: Not on file    Attends meetings of clubs or organizations: Not on file    Relationship status: Not  on file  . Intimate partner violence:    Fear of current or ex partner: Not on file    Emotionally abused: Not on file    Physically abused: Not on file    Forced sexual activity: Not on file  Other Topics Concern  . Not on file  Social History Narrative  . Not on file    Review of Systems: 12 system ROS is negative except as noted above.  There were no vitals filed for this visit.  Physical Exam: Vital signs were reviewed. General:   Alert, well-nourished, pleasant and cooperative in NAD Head:  Normocephalic and atraumatic. Eyes:  Sclera clear, no icterus.   Conjunctiva pink. Mouth:  No deformity or lesions.   Neck:  Supple; no thyromegaly. Lungs:  Clear throughout to auscultation.   No wheezes.  Heart:  Regular rate and rhythm; no murmurs Abdomen:  Soft, nontender, normal bowel sounds. No rebound or guarding. No hepatosplenomegaly Rectal:  Deferred  Msk:  Symmetrical without gross deformities. Extremities:  No gross deformities or edema. Neurologic:  Alert and  oriented x4;  grossly nonfocal Skin:  No rash or bruise. Psych:  Alert and cooperative. Normal mood and affect.   Theresa Sawyer L. Theresa Glenn, MD, MPH [facility] 09/09/2018, 10:17 AM

## 2018-09-11 ENCOUNTER — Other Ambulatory Visit: Payer: Self-pay | Admitting: Physician Assistant

## 2018-09-11 DIAGNOSIS — F411 Generalized anxiety disorder: Secondary | ICD-10-CM

## 2018-09-19 DIAGNOSIS — G4733 Obstructive sleep apnea (adult) (pediatric): Secondary | ICD-10-CM | POA: Diagnosis not present

## 2018-10-10 ENCOUNTER — Ambulatory Visit: Payer: BLUE CROSS/BLUE SHIELD | Admitting: Gastroenterology

## 2018-10-20 DIAGNOSIS — G4733 Obstructive sleep apnea (adult) (pediatric): Secondary | ICD-10-CM | POA: Diagnosis not present

## 2018-10-25 ENCOUNTER — Other Ambulatory Visit: Payer: Self-pay | Admitting: Emergency Medicine

## 2018-10-25 DIAGNOSIS — L409 Psoriasis, unspecified: Secondary | ICD-10-CM

## 2018-10-25 NOTE — Telephone Encounter (Signed)
Please advise on below, unable to approve for no protocol is assigned to medication.

## 2018-11-13 ENCOUNTER — Other Ambulatory Visit: Payer: Self-pay | Admitting: Emergency Medicine

## 2018-11-13 DIAGNOSIS — F411 Generalized anxiety disorder: Secondary | ICD-10-CM

## 2018-11-13 NOTE — Telephone Encounter (Signed)
Please advise 

## 2018-11-19 DIAGNOSIS — G4733 Obstructive sleep apnea (adult) (pediatric): Secondary | ICD-10-CM | POA: Diagnosis not present

## 2018-11-26 ENCOUNTER — Telehealth: Payer: Self-pay

## 2018-11-26 NOTE — Telephone Encounter (Signed)
Unable to get in contact with the patient to convert their office visit with Amy on 12/05/2018 into a doxy.me visit. I left a voicemail asking the patient to return my call. Office number was provided.   If patient calls back please convert their office visit into a doxy.me visit.

## 2018-11-29 DIAGNOSIS — R42 Dizziness and giddiness: Secondary | ICD-10-CM | POA: Diagnosis not present

## 2018-12-05 ENCOUNTER — Ambulatory Visit: Payer: Self-pay | Admitting: Family Medicine

## 2018-12-05 ENCOUNTER — Telehealth: Payer: Self-pay | Admitting: Adult Health

## 2018-12-05 ENCOUNTER — Ambulatory Visit: Payer: Self-pay | Admitting: Adult Health

## 2018-12-05 ENCOUNTER — Other Ambulatory Visit: Payer: Self-pay

## 2018-12-05 ENCOUNTER — Ambulatory Visit (INDEPENDENT_AMBULATORY_CARE_PROVIDER_SITE_OTHER): Payer: BLUE CROSS/BLUE SHIELD | Admitting: Emergency Medicine

## 2018-12-05 ENCOUNTER — Encounter: Payer: Self-pay | Admitting: Emergency Medicine

## 2018-12-05 VITALS — BP 133/84 | HR 87 | Temp 98.2°F | Resp 16 | Wt 241.4 lb

## 2018-12-05 DIAGNOSIS — F418 Other specified anxiety disorders: Secondary | ICD-10-CM | POA: Diagnosis not present

## 2018-12-05 DIAGNOSIS — I10 Essential (primary) hypertension: Secondary | ICD-10-CM | POA: Diagnosis not present

## 2018-12-05 DIAGNOSIS — L409 Psoriasis, unspecified: Secondary | ICD-10-CM | POA: Diagnosis not present

## 2018-12-05 MED ORDER — BETAMETHASONE DIPROPIONATE AUG 0.05 % EX CREA: 1.0000 "application " | TOPICAL_CREAM | Freq: Every day | CUTANEOUS | 2 refills | Status: DC | PRN

## 2018-12-05 MED ORDER — ALPRAZOLAM 0.5 MG PO TABS: 0.5000 mg | ORAL_TABLET | Freq: Two times a day (BID) | ORAL | 1 refills | Status: DC | PRN

## 2018-12-05 NOTE — Patient Instructions (Signed)
Health Maintenance, Female Adopting a healthy lifestyle and getting preventive care can go a long way to promote health and wellness. Talk with your health care provider about what schedule of regular examinations is right for you. This is a good chance for you to check in with your provider about disease prevention and staying healthy. In between checkups, there are plenty of things you can do on your own. Experts have done a lot of research about which lifestyle changes and preventive measures are most likely to keep you healthy. Ask your health care provider for more information. Weight and diet Eat a healthy diet  Be sure to include plenty of vegetables, fruits, low-fat dairy products, and lean protein.  Do not eat a lot of foods high in solid fats, added sugars, or salt.  Get regular exercise. This is one of the most important things you can do for your health. ? Most adults should exercise for at least 150 minutes each week. The exercise should increase your heart rate and make you sweat (moderate-intensity exercise). ? Most adults should also do strengthening exercises at least twice a week. This is in addition to the moderate-intensity exercise. Maintain a healthy weight  Body mass index (BMI) is a measurement that can be used to identify possible weight problems. It estimates body fat based on height and weight. Your health care provider can help determine your BMI and help you achieve or maintain a healthy weight.  For females 20 years of age and older: ? A BMI below 18.5 is considered underweight. ? A BMI of 18.5 to 24.9 is normal. ? A BMI of 25 to 29.9 is considered overweight. ? A BMI of 30 and above is considered obese. Watch levels of cholesterol and blood lipids  You should start having your blood tested for lipids and cholesterol at 40 years of age, then have this test every 5 years.  You may need to have your cholesterol levels checked more often if: ? Your lipid or  cholesterol levels are high. ? You are older than 40 years of age. ? You are at high risk for heart disease. Cancer screening Lung Cancer  Lung cancer screening is recommended for adults 55-80 years old who are at high risk for lung cancer because of a history of smoking.  A yearly low-dose CT scan of the lungs is recommended for people who: ? Currently smoke. ? Have quit within the past 15 years. ? Have at least a 30-pack-year history of smoking. A pack year is smoking an average of one pack of cigarettes a day for 1 year.  Yearly screening should continue until it has been 15 years since you quit.  Yearly screening should stop if you develop a health problem that would prevent you from having lung cancer treatment. Breast Cancer  Practice breast self-awareness. This means understanding how your breasts normally appear and feel.  It also means doing regular breast self-exams. Let your health care provider know about any changes, no matter how small.  If you are in your 20s or 30s, you should have a clinical breast exam (CBE) by a health care provider every 1-3 years as part of a regular health exam.  If you are 40 or older, have a CBE every year. Also consider having a breast X-ray (mammogram) every year.  If you have a family history of breast cancer, talk to your health care provider about genetic screening.  If you are at high risk for breast cancer, talk   to your health care provider about having an MRI and a mammogram every year.  Breast cancer gene (BRCA) assessment is recommended for women who have family members with BRCA-related cancers. BRCA-related cancers include: ? Breast. ? Ovarian. ? Tubal. ? Peritoneal cancers.  Results of the assessment will determine the need for genetic counseling and BRCA1 and BRCA2 testing. Cervical Cancer Your health care provider may recommend that you be screened regularly for cancer of the pelvic organs (ovaries, uterus, and vagina).  This screening involves a pelvic examination, including checking for microscopic changes to the surface of your cervix (Pap test). You may be encouraged to have this screening done every 3 years, beginning at age 21.  For women ages 30-65, health care providers may recommend pelvic exams and Pap testing every 3 years, or they may recommend the Pap and pelvic exam, combined with testing for human papilloma virus (HPV), every 5 years. Some types of HPV increase your risk of cervical cancer. Testing for HPV may also be done on women of any age with unclear Pap test results.  Other health care providers may not recommend any screening for nonpregnant women who are considered low risk for pelvic cancer and who do not have symptoms. Ask your health care provider if a screening pelvic exam is right for you.  If you have had past treatment for cervical cancer or a condition that could lead to cancer, you need Pap tests and screening for cancer for at least 20 years after your treatment. If Pap tests have been discontinued, your risk factors (such as having a new sexual partner) need to be reassessed to determine if screening should resume. Some women have medical problems that increase the chance of getting cervical cancer. In these cases, your health care provider may recommend more frequent screening and Pap tests. Colorectal Cancer  This type of cancer can be detected and often prevented.  Routine colorectal cancer screening usually begins at 40 years of age and continues through 40 years of age.  Your health care provider may recommend screening at an earlier age if you have risk factors for colon cancer.  Your health care provider may also recommend using home test kits to check for hidden blood in the stool.  A small camera at the end of a tube can be used to examine your colon directly (sigmoidoscopy or colonoscopy). This is done to check for the earliest forms of colorectal cancer.  Routine  screening usually begins at age 50.  Direct examination of the colon should be repeated every 5-10 years through 40 years of age. However, you may need to be screened more often if early forms of precancerous polyps or small growths are found. Skin Cancer  Check your skin from head to toe regularly.  Tell your health care provider about any new moles or changes in moles, especially if there is a change in a mole's shape or color.  Also tell your health care provider if you have a mole that is larger than the size of a pencil eraser.  Always use sunscreen. Apply sunscreen liberally and repeatedly throughout the day.  Protect yourself by wearing long sleeves, pants, a wide-brimmed hat, and sunglasses whenever you are outside. Heart disease, diabetes, and high blood pressure  High blood pressure causes heart disease and increases the risk of stroke. High blood pressure is more likely to develop in: ? People who have blood pressure in the high end of the normal range (130-139/85-89 mm Hg). ? People   who are overweight or obese. ? People who are African American.  If you are 84-22 years of age, have your blood pressure checked every 3-5 years. If you are 67 years of age or older, have your blood pressure checked every year. You should have your blood pressure measured twice-once when you are at a hospital or clinic, and once when you are not at a hospital or clinic. Record the average of the two measurements. To check your blood pressure when you are not at a hospital or clinic, you can use: ? An automated blood pressure machine at a pharmacy. ? A home blood pressure monitor.  If you are between 52 years and 3 years old, ask your health care provider if you should take aspirin to prevent strokes.  Have regular diabetes screenings. This involves taking a blood sample to check your fasting blood sugar level. ? If you are at a normal weight and have a low risk for diabetes, have this test once  every three years after 40 years of age. ? If you are overweight and have a high risk for diabetes, consider being tested at a younger age or more often. Preventing infection Hepatitis B  If you have a higher risk for hepatitis B, you should be screened for this virus. You are considered at high risk for hepatitis B if: ? You were born in a country where hepatitis B is common. Ask your health care provider which countries are considered high risk. ? Your parents were born in a high-risk country, and you have not been immunized against hepatitis B (hepatitis B vaccine). ? You have HIV or AIDS. ? You use needles to inject street drugs. ? You live with someone who has hepatitis B. ? You have had sex with someone who has hepatitis B. ? You get hemodialysis treatment. ? You take certain medicines for conditions, including cancer, organ transplantation, and autoimmune conditions. Hepatitis C  Blood testing is recommended for: ? Everyone born from [birth year] through 1967. ? Anyone with known risk factors for hepatitis C. Sexually transmitted infections (STIs)  You should be screened for sexually transmitted infections (STIs) including gonorrhea and chlamydia if: ? You are sexually active and are younger than 40 years of age. ? You are older than 40 years of age and your health care provider tells you that you are at risk for this type of infection. ? Your sexual activity has changed since you were last screened and you are at an increased risk for chlamydia or gonorrhea. Ask your health care provider if you are at risk.  If you do not have HIV, but are at risk, it may be recommended that you take a prescription medicine daily to prevent HIV infection. This is called pre-exposure prophylaxis (PrEP). You are considered at risk if: ? You are sexually active and do not regularly use condoms or know the HIV status of your partner(s). ? You take drugs by injection. ? You are sexually active with a partner  who has HIV. Talk with your health care provider about whether you are at high risk of being infected with HIV. If you choose to begin PrEP, you should first be tested for HIV. You should then be tested every 3 months for as long as you are taking PrEP. Pregnancy  If you are premenopausal and you may become pregnant, ask your health care provider about preconception counseling.  If you may become pregnant, take 400 to 800 micrograms (mcg) of folic acid every  day.  If you want to prevent pregnancy, talk to your health care provider about birth control (contraception). Osteoporosis and menopause  Osteoporosis is a disease in which the bones lose minerals and strength with aging. This can result in serious bone fractures. Your risk for osteoporosis can be identified using a bone density scan.  If you are 27 years of age or older, or if you are at risk for osteoporosis and fractures, ask your health care provider if you should be screened.  Ask your health care provider whether you should take a calcium or vitamin D supplement to lower your risk for osteoporosis.  Menopause may have certain physical symptoms and risks.  Hormone replacement therapy may reduce some of these symptoms and risks. Talk to your health care provider about whether hormone replacement therapy is right for you. Follow these instructions at home:  Schedule regular health, dental, and eye exams.  Stay current with your immunizations.  Do not use any tobacco products including cigarettes, chewing tobacco, or electronic cigarettes.  If you are pregnant, do not drink alcohol.  If you are breastfeeding, limit how much and how often you drink alcohol.  Limit alcohol intake to no more than 1 drink per day for nonpregnant women. One drink equals 12 ounces of beer, 5 ounces of wine, or 1 ounces of hard liquor.  Do not use street drugs.  Do not share needles.  Ask your health care provider for help if you need support  or information about quitting drugs.  Tell your health care provider if you often feel depressed.  Tell your health care provider if you have ever been abused or do not feel safe at home. This information is not intended to replace advice given to you by your health care provider. Make sure you discuss any questions you have with your health care provider. Document Released: 01/09/2011 Document Revised: 12/02/2015 Document Reviewed: 03/30/2015 Elsevier Interactive Patient Education  2019 [employer]. How to Take Your Blood Pressure You can take your blood pressure at home with a machine. You may need to check your blood pressure at home:  To check if you have high blood pressure (hypertension).  To check your blood pressure over time.  To make sure your blood pressure medicine is working. Supplies needed: You will need a blood pressure machine, or monitor. You can buy one at a drugstore or online. When choosing one:  Choose one with an arm cuff.  Choose one that wraps around your upper arm. Only one finger should fit between your arm and the cuff.  Do not choose one that measures your blood pressure from your wrist or finger. Your doctor can suggest a monitor. How to prepare Avoid these things for 30 minutes before checking your blood pressure:  Drinking caffeine.  Drinking alcohol.  Eating.  Smoking.  Exercising. Five minutes before checking your blood pressure:  Pee.  Sit in a dining chair. Avoid sitting in a soft couch or armchair.  Be quiet. Do not talk. How to take your blood pressure Follow the instructions that came with your machine. If you have a digital blood pressure monitor, these may be the instructions: 1. Sit up straight. 2. Place your feet on the floor. Do not cross your ankles or legs. 3. Rest your left arm at the level of your heart. You may rest it on a table, desk, or chair. 4. Pull up your shirt sleeve. 5. Wrap the blood pressure cuff around  the upper part  of your left arm. The cuff should be 1 inch (2.5 cm) above your elbow. It is best to wrap the cuff around bare skin. 6. Fit the cuff snugly around your arm. You should be able to place only one finger between the cuff and your arm. 7. Put the cord inside the groove of your elbow. 8. Press the power button. 9. Sit quietly while the cuff fills with air and loses air. 10. Write down the numbers on the screen. 11. Wait 2-3 minutes and then repeat steps 1-10. What do the numbers mean? Two numbers make up your blood pressure. The first number is called systolic pressure. The second is called diastolic pressure. An example of a blood pressure reading is "120 over 80" (or 120/80). If you are an adult and do not have a medical condition, use this guide to find out if your blood pressure is normal: Normal  First number: below 120.  Second number: below 80. Elevated  First number: 120-129.  Second number: below 80. Hypertension stage 1  First number: 130-139.  Second number: 80-89. Hypertension stage 2  First number: 140 or above.  Second number: 3 or above. Your blood pressure is above normal even if only the top or bottom number is above normal. Follow these instructions at home:  Check your blood pressure as often as your doctor tells you to.  Take your monitor to your next doctor's appointment. Your doctor will: ? Make sure you are using it correctly. ? Make sure it is working right.  Make sure you understand what your blood pressure numbers should be.  Tell your doctor if your medicines are causing side effects. Contact a doctor if:  Your blood pressure keeps being high. Get help right away if:  Your first blood pressure number is higher than 180.  Your second blood pressure number is higher than 120. This information is not intended to replace advice given to you by your health care provider. Make sure you discuss any questions you have with your health  care provider. Document Released: 06/08/2008 Document Revised: 05/24/2016 Document Reviewed: 12/03/2015 Elsevier Interactive Patient Education  2019 [employer].

## 2018-12-05 NOTE — Telephone Encounter (Signed)
Pt left a message about rescheduling her appt with Janett Billow. Please reach out to the pt.

## 2018-12-05 NOTE — Telephone Encounter (Signed)
THis is a Dr.Athar pt for sleep. It was on Jessica NP schedule but pt never responded. Can you schedule with Jinny Blossom NP. Janett Billow NP is only back up if MEgan schedule does not permit.      Documentation

## 2018-12-05 NOTE — Telephone Encounter (Signed)
THis is a Dr.Athar pt for sleep. It was on Jessica NP schedule but pt never responded. Can you schedule with Jinny Blossom NP. Janett Billow NP is only back up if MEgan schedule does not permit.

## 2018-12-05 NOTE — Telephone Encounter (Signed)
LMVM for pt to call back to get rescheduled for cpap visit.  Due to current COVID 19 pandemic, our office is severely reducing in office visits until further notice, in order to minimize the risk to our patients and healthcare providers. Unable to get in contact with the patient to convert an office visit with Amy NP into a doxy.me visit. I left a voicemail asking the patient to return my call. Office number was provided.     If patient calls back please convert an office visit into a doxy.me visit.

## 2018-12-05 NOTE — Progress Notes (Signed)
BP Readings from Last 3 Encounters:  12/05/18 133/84  08/22/18 (!) 144/97  04/30/18 (!) 120/94   Myrtie Hawk 38 y.o.   Chief Complaint  Patient presents with  . Hypertension    was seen at the urgen care on 11/29/18 bp was 171/95 they did not put me on many medication  . Gastroesophageal Reflux    chest spasam, pressure on chest. But do have hx of acid reflex. Patient was informed we may not get to sere her for all her problems today. Patient stated she do not think her protonix do not seem to be working. Patient stated you referred her to gastro but haver not seen gastro yet due to schedule.[id number]    HISTORY OF PRESENT ILLNESS: This is a 38 y.o. female here for follow-up of intermittent hypertension.  On no medications.  Last Friday went to [facility] and blood pressure was 171/95.  Went to urgent care center and by them blood pressure was better.  Not started on any medication. Here for follow-up.  No new symptoms. Has a history of chronic costochondritis and GERD. Has history of psoriasis with arthritis.  Recently started on [place]. Complaining of increased anxiety related to pandemic. No other complaints or medical concerns today.  HPI   Prior to Admission medications   Medication Sig Start Date End Date Taking? Authorizing Provider  Apremilast (OTEZLA) 30 MG TABS Take by mouth 2 (two) times a day.   Yes [provider]  aspirin EC 81 MG tablet Take 81 mg by mouth daily.   Yes [provider]  escitalopram (LEXAPRO) 10 MG tablet TAKE 1 TABLET EVERY DAY FOR 3 WEEKS THEN MAY TAKE AN ADDITIONAL 1/2 TABLET EVERY DAY 11/14/18  Yes McVey, Gelene Mink, PA-C  escitalopram (LEXAPRO) 5 MG tablet TAKE 1 TABLET BY MOUTH EVERY DAY, MAY INCREASE TO 2 TABLETS AFTER 3 WEEKS IF NEEDED 07/05/18  Yes McVey, Gelene Mink, PA-C  Menthol, Topical Analgesic, (ICY HOT EX) Apply 1 application topically daily as needed (for muscle aches/pain).   Yes [provider]   metoprolol tartrate (LOPRESSOR) 100 MG tablet Take 1 tablet by mouth 2 hours prior to Cardiac CT 04/23/18  Yes Jettie Booze, MD  Multiple Vitamins-Minerals (MULTIVITAMIN WITH MINERALS) tablet Take 1 tablet by mouth daily.   Yes [provider]  pantoprazole (PROTONIX) 40 MG tablet TAKE 1 TABLET(40 MG) BY MOUTH DAILY 05/20/18  Yes McVey, Gelene Mink, PA-C    No Known Allergies  There are no active problems to display for this patient.   Past Medical History:  Diagnosis Date  . Anxiety   . High cholesterol   . Sterilization consult 01/29/2013   BTL in hosp desired. Papers signed 12/16/12   . SVD (spontaneous vaginal delivery) 02/06/2013    Past Surgical History:  Procedure Laterality Date  . NO PAST SURGERIES    . TUBAL LIGATION Bilateral 02/05/2013   Procedure: POST PARTUM TUBAL LIGATION;  Surgeon: Woodroe Mode, MD;  Location: [facility];  Service: Gynecology;  Laterality: Bilateral;    Social History   Socioeconomic History  . Marital status: Legally Separated    Spouse name: Not on file  . Number of children: Not on file  . Years of education: Not on file  . Highest education level: Not on file  Occupational History  . Not on file  Social Needs  . Financial resource strain: Not on file  . Food insecurity:    Worry: Not on file  Inability: Not on file  . Transportation needs:    Medical: Not on file    Non-medical: Not on file  Tobacco Use  . Smoking status: Current Some Day Smoker    Packs/day: 0.50    Types: Cigarettes  . Smokeless tobacco: Never Used  Substance and Sexual Activity  . Alcohol use: No  . Drug use: No  . Sexual activity: Yes    Birth control/protection: Surgical  Lifestyle  . Physical activity:    Days per week: Not on file    Minutes per session: Not on file  . Stress: Not on file  Relationships  . Social connections:    Talks on phone: Not on file    Gets together: Not on file    Attends religious service: Not on  file    Active member of club or organization: Not on file    Attends meetings of clubs or organizations: Not on file    Relationship status: Not on file  . Intimate partner violence:    Fear of current or ex partner: Not on file    Emotionally abused: Not on file    Physically abused: Not on file    Forced sexual activity: Not on file  Other Topics Concern  . Not on file  Social History Narrative  . Not on file    Family History  Problem Relation Age of Onset  . Diabetes Maternal Grandmother   . Diabetes Paternal Grandmother   . Anxiety disorder Mother   . Heart attack Brother   . Anxiety disorder Brother      Review of Systems  Constitutional: Negative.  Negative for chills and fever.  HENT: Negative.  Negative for congestion and sore throat.   Eyes: Negative.  Negative for discharge and redness.  Respiratory: Negative.  Negative for cough and shortness of breath.   Cardiovascular: Positive for chest pain (Chronic costochondritis).  Gastrointestinal: Negative.  Negative for abdominal pain, diarrhea, nausea and vomiting.  Genitourinary: Negative.   Musculoskeletal: Negative for myalgias.  Skin: Positive for rash.  Neurological: Negative for dizziness and headaches.  Endo/Heme/Allergies: Negative.   Psychiatric/Behavioral: The patient is nervous/anxious.   All other systems reviewed and are negative.  Vitals:   12/05/18 0858  BP: 133/84  Pulse: 87  Resp: 16  Temp: 98.2 F (36.8 C)  SpO2: 99%     Physical Exam Constitutional:      Appearance: She is obese.  HENT:     Head: Normocephalic and atraumatic.     Nose: Nose normal.     Mouth/Throat:     Mouth: Mucous membranes are moist.     Pharynx: Oropharynx is clear.  Eyes:     Extraocular Movements: Extraocular movements intact.     Pupils: Pupils are equal, round, and reactive to light.  Neck:     Musculoskeletal: Normal range of motion and neck supple.  Cardiovascular:     Rate and Rhythm: Normal rate  and regular rhythm.     Heart sounds: Normal heart sounds.  Pulmonary:     Effort: Pulmonary effort is normal.     Breath sounds: Normal breath sounds.  Chest:     Chest wall: Tenderness present.  Abdominal:     Palpations: Abdomen is soft.     Tenderness: There is no abdominal tenderness.  Musculoskeletal: Normal range of motion.  Skin:    General: Skin is warm and dry.     Capillary Refill: Capillary refill takes less than 2 seconds.  Comments: Psoriatic patches on elbow and knee areas  Neurological:     General: No focal deficit present.     Mental Status: She is alert and oriented to person, place, and time.  Psychiatric:        Mood and Affect: Mood normal.        Behavior: Behavior normal.      ASSESSMENT & PLAN: Kenndra was seen today for hypertension and gastroesophageal reflux.  Diagnoses and all orders for this visit:  Situational anxiety -     ALPRAZolam (XANAX) 0.5 MG tablet; Take 1 tablet (0.5 mg total) by mouth 2 (two) times daily as needed for anxiety.  Psoriasis -     augmented betamethasone dipropionate (DIPROLENE-AF) 0.05 % cream; Apply 1 application topically daily as needed.  Intermittent hypertension    Patient Instructions  Health Maintenance, Female Adopting a healthy lifestyle and getting preventive care can go a long way to promote health and wellness. Talk with your health care provider about what schedule of regular examinations is right for you. This is a good chance for you to check in with your provider about disease prevention and staying healthy. In between checkups, there are plenty of things you can do on your own. Experts have done a lot of research about which lifestyle changes and preventive measures are most likely to keep you healthy. Ask your health care provider for more information. Weight and diet Eat a healthy diet  Be sure to include plenty of vegetables, fruits, low-fat dairy products, and lean protein.  Do not eat a lot  of foods high in solid fats, added sugars, or salt.  Get regular exercise. This is one of the most important things you can do for your health. ? Most adults should exercise for at least 150 minutes each week. The exercise should increase your heart rate and make you sweat (moderate-intensity exercise). ? Most adults should also do strengthening exercises at least twice a week. This is in addition to the moderate-intensity exercise. Maintain a healthy weight  Body mass index (BMI) is a measurement that can be used to identify possible weight problems. It estimates body fat based on height and weight. Your health care provider can help determine your BMI and help you achieve or maintain a healthy weight.  For females 43 years of age and older: ? A BMI below 18.5 is considered underweight. ? A BMI of 18.5 to 24.9 is normal. ? A BMI of 25 to 29.9 is considered overweight. ? A BMI of 30 and above is considered obese. Watch levels of cholesterol and blood lipids  You should start having your blood tested for lipids and cholesterol at 40 years of age, then have this test every 5 years.  You may need to have your cholesterol levels checked more often if: ? Your lipid or cholesterol levels are high. ? You are older than 40 years of age. ? You are at high risk for heart disease. Cancer screening Lung Cancer  Lung cancer screening is recommended for adults 74-74 years old who are at high risk for lung cancer because of a history of smoking.  A yearly low-dose CT scan of the lungs is recommended for people who: ? Currently smoke. ? Have quit within the past 15 years. ? Have at least a 30-pack-year history of smoking. A pack year is smoking an average of one pack of cigarettes a day for 1 year.  Yearly screening should continue until it has been 15 years  since you quit.  Yearly screening should stop if you develop a health problem that would prevent you from having lung cancer treatment.  Breast Cancer  Practice breast self-awareness. This means understanding how your breasts normally appear and feel.  It also means doing regular breast self-exams. Let your health care provider know about any changes, no matter how small.  If you are in your 20s or 30s, you should have a clinical breast exam (CBE) by a health care provider every 1-3 years as part of a regular health exam.  If you are 13 or older, have a CBE every year. Also consider having a breast X-ray (mammogram) every year.  If you have a family history of breast cancer, talk to your health care provider about genetic screening.  If you are at high risk for breast cancer, talk to your health care provider about having an MRI and a mammogram every year.  Breast cancer gene (BRCA) assessment is recommended for women who have family members with BRCA-related cancers. BRCA-related cancers include: ? Breast. ? Ovarian. ? Tubal. ? Peritoneal cancers.  Results of the assessment will determine the need for genetic counseling and BRCA1 and BRCA2 testing. Cervical Cancer Your health care provider may recommend that you be screened regularly for cancer of the pelvic organs (ovaries, uterus, and vagina). This screening involves a pelvic examination, including checking for microscopic changes to the surface of your cervix (Pap test). You may be encouraged to have this screening done every 3 years, beginning at age 84.  For women ages 43-65, health care providers may recommend pelvic exams and Pap testing every 3 years, or they may recommend the Pap and pelvic exam, combined with testing for human papilloma virus (HPV), every 5 years. Some types of HPV increase your risk of cervical cancer. Testing for HPV may also be done on women of any age with unclear Pap test results.  Other health care providers may not recommend any screening for nonpregnant women who are considered low risk for pelvic cancer and who do not have symptoms. Ask  your health care provider if a screening pelvic exam is right for you.  If you have had past treatment for cervical cancer or a condition that could lead to cancer, you need Pap tests and screening for cancer for at least 20 years after your treatment. If Pap tests have been discontinued, your risk factors (such as having a new sexual partner) need to be reassessed to determine if screening should resume. Some women have medical problems that increase the chance of getting cervical cancer. In these cases, your health care provider may recommend more frequent screening and Pap tests. Colorectal Cancer  This type of cancer can be detected and often prevented.  Routine colorectal cancer screening usually begins at 40 years of age and continues through 40 years of age.  Your health care provider may recommend screening at an earlier age if you have risk factors for colon cancer.  Your health care provider may also recommend using home test kits to check for hidden blood in the stool.  A small camera at the end of a tube can be used to examine your colon directly (sigmoidoscopy or colonoscopy). This is done to check for the earliest forms of colorectal cancer.  Routine screening usually begins at age 24.  Direct examination of the colon should be repeated every 5-10 years through 40 years of age. However, you may need to be screened more often if early forms of  precancerous polyps or small growths are found. Skin Cancer  Check your skin from head to toe regularly.  Tell your health care provider about any new moles or changes in moles, especially if there is a change in a mole's shape or color.  Also tell your health care provider if you have a mole that is larger than the size of a pencil eraser.  Always use sunscreen. Apply sunscreen liberally and repeatedly throughout the day.  Protect yourself by wearing long sleeves, pants, a wide-brimmed hat, and sunglasses whenever you are outside.  Heart disease, diabetes, and high blood pressure  High blood pressure causes heart disease and increases the risk of stroke. High blood pressure is more likely to develop in: ? People who have blood pressure in the high end of the normal range (130-139/85-89 mm Hg). ? People who are overweight or obese. ? People who are African American.  If you are 23-41 years of age, have your blood pressure checked every 3-5 years. If you are 40 years of age or older, have your blood pressure checked every year. You should have your blood pressure measured twice-once when you are at a hospital or clinic, and once when you are not at a hospital or clinic. Record the average of the two measurements. To check your blood pressure when you are not at a hospital or clinic, you can use: ? An automated blood pressure machine at a pharmacy. ? A home blood pressure monitor.  If you are between 86 years and 16 years old, ask your health care provider if you should take aspirin to prevent strokes.  Have regular diabetes screenings. This involves taking a blood sample to check your fasting blood sugar level. ? If you are at a normal weight and have a low risk for diabetes, have this test once every three years after 40 years of age. ? If you are overweight and have a high risk for diabetes, consider being tested at a younger age or more often. Preventing infection Hepatitis B  If you have a higher risk for hepatitis B, you should be screened for this virus. You are considered at high risk for hepatitis B if: ? You were born in a country where hepatitis B is common. Ask your health care provider which countries are considered high risk. ? Your parents were born in a high-risk country, and you have not been immunized against hepatitis B (hepatitis B vaccine). ? You have HIV or AIDS. ? You use needles to inject street drugs. ? You live with someone who has hepatitis B. ? You have had sex with someone who has hepatitis  B. ? You get hemodialysis treatment. ? You take certain medicines for conditions, including cancer, organ transplantation, and autoimmune conditions. Hepatitis C  Blood testing is recommended for: ? Everyone born from [birth year] through 1967. ? Anyone with known risk factors for hepatitis C. Sexually transmitted infections (STIs)  You should be screened for sexually transmitted infections (STIs) including gonorrhea and chlamydia if: ? You are sexually active and are younger than 40 years of age. ? You are older than 40 years of age and your health care provider tells you that you are at risk for this type of infection. ? Your sexual activity has changed since you were last screened and you are at an increased risk for chlamydia or gonorrhea. Ask your health care provider if you are at risk.  If you do not have HIV, but are at risk, it may  be recommended that you take a prescription medicine daily to prevent HIV infection. This is called pre-exposure prophylaxis (PrEP). You are considered at risk if: ? You are sexually active and do not regularly use condoms or know the HIV status of your partner(s). ? You take drugs by injection. ? You are sexually active with a partner who has HIV. Talk with your health care provider about whether you are at high risk of being infected with HIV. If you choose to begin PrEP, you should first be tested for HIV. You should then be tested every 3 months for as long as you are taking PrEP. Pregnancy  If you are premenopausal and you may become pregnant, ask your health care provider about preconception counseling.  If you may become pregnant, take 400 to 800 micrograms (mcg) of folic acid every day.  If you want to prevent pregnancy, talk to your health care provider about birth control (contraception). Osteoporosis and menopause  Osteoporosis is a disease in which the bones lose minerals and strength with aging. This can result in serious bone fractures. Your  risk for osteoporosis can be identified using a bone density scan.  If you are 70 years of age or older, or if you are at risk for osteoporosis and fractures, ask your health care provider if you should be screened.  Ask your health care provider whether you should take a calcium or vitamin D supplement to lower your risk for osteoporosis.  Menopause may have certain physical symptoms and risks.  Hormone replacement therapy may reduce some of these symptoms and risks. Talk to your health care provider about whether hormone replacement therapy is right for you. Follow these instructions at home:  Schedule regular health, dental, and eye exams.  Stay current with your immunizations.  Do not use any tobacco products including cigarettes, chewing tobacco, or electronic cigarettes.  If you are pregnant, do not drink alcohol.  If you are breastfeeding, limit how much and how often you drink alcohol.  Limit alcohol intake to no more than 1 drink per day for nonpregnant women. One drink equals 12 ounces of beer, 5 ounces of wine, or 1 ounces of hard liquor.  Do not use street drugs.  Do not share needles.  Ask your health care provider for help if you need support or information about quitting drugs.  Tell your health care provider if you often feel depressed.  Tell your health care provider if you have ever been abused or do not feel safe at home. This information is not intended to replace advice given to you by your health care provider. Make sure you discuss any questions you have with your health care provider. Document Released: 01/09/2011 Document Revised: 12/02/2015 Document Reviewed: 03/30/2015 Elsevier Interactive Patient Education  2019 [employer]. How to Take Your Blood Pressure You can take your blood pressure at home with a machine. You may need to check your blood pressure at home:  To check if you have high blood pressure (hypertension).  To check your blood  pressure over time.  To make sure your blood pressure medicine is working. Supplies needed: You will need a blood pressure machine, or monitor. You can buy one at a drugstore or online. When choosing one:  Choose one with an arm cuff.  Choose one that wraps around your upper arm. Only one finger should fit between your arm and the cuff.  Do not choose one that measures your blood pressure from your wrist or finger.  Your doctor can suggest a monitor. How to prepare Avoid these things for 30 minutes before checking your blood pressure:  Drinking caffeine.  Drinking alcohol.  Eating.  Smoking.  Exercising. Five minutes before checking your blood pressure:  Pee.  Sit in a dining chair. Avoid sitting in a soft couch or armchair.  Be quiet. Do not talk. How to take your blood pressure Follow the instructions that came with your machine. If you have a digital blood pressure monitor, these may be the instructions: 1. Sit up straight. 2. Place your feet on the floor. Do not cross your ankles or legs. 3. Rest your left arm at the level of your heart. You may rest it on a table, desk, or chair. 4. Pull up your shirt sleeve. 5. Wrap the blood pressure cuff around the upper part of your left arm. The cuff should be 1 inch (2.5 cm) above your elbow. It is best to wrap the cuff around bare skin. 6. Fit the cuff snugly around your arm. You should be able to place only one finger between the cuff and your arm. 7. Put the cord inside the groove of your elbow. 8. Press the power button. 9. Sit quietly while the cuff fills with air and loses air. 10. Write down the numbers on the screen. 11. Wait 2-3 minutes and then repeat steps 1-10. What do the numbers mean? Two numbers make up your blood pressure. The first number is called systolic pressure. The second is called diastolic pressure. An example of a blood pressure reading is "120 over 80" (or 120/80). If you are an adult and do not have  a medical condition, use this guide to find out if your blood pressure is normal: Normal  First number: below 120.  Second number: below 80. Elevated  First number: 120-129.  Second number: below 80. Hypertension stage 1  First number: 130-139.  Second number: 80-89. Hypertension stage 2  First number: 140 or above.  Second number: 26 or above. Your blood pressure is above normal even if only the top or bottom number is above normal. Follow these instructions at home:  Check your blood pressure as often as your doctor tells you to.  Take your monitor to your next doctor's appointment. Your doctor will: ? Make sure you are using it correctly. ? Make sure it is working right.  Make sure you understand what your blood pressure numbers should be.  Tell your doctor if your medicines are causing side effects. Contact a doctor if:  Your blood pressure keeps being high. Get help right away if:  Your first blood pressure number is higher than 180.  Your second blood pressure number is higher than 120. This information is not intended to replace advice given to you by your health care provider. Make sure you discuss any questions you have with your health care provider. Document Released: 06/08/2008 Document Revised: 05/24/2016 Document Reviewed: 12/03/2015 Elsevier Interactive Patient Education  2019 [employer] Inc[employer]      Agustina Caroli, MD [facility]

## 2018-12-20 DIAGNOSIS — G4733 Obstructive sleep apnea (adult) (pediatric): Secondary | ICD-10-CM | POA: Diagnosis not present

## 2018-12-26 ENCOUNTER — Other Ambulatory Visit: Payer: Self-pay | Admitting: Physician Assistant

## 2018-12-26 DIAGNOSIS — F411 Generalized anxiety disorder: Secondary | ICD-10-CM

## 2018-12-26 NOTE — Telephone Encounter (Signed)
Forwarding medication refill to PCP for review. 

## 2018-12-29 NOTE — Telephone Encounter (Signed)
Schedule OV visit for evaluation of generalized anxiety disorder.  This medication was not started by me so I need to evaluate her.  Thanks.

## 2018-12-30 ENCOUNTER — Telehealth: Payer: Self-pay | Admitting: Emergency Medicine

## 2018-12-30 NOTE — Telephone Encounter (Signed)
VM LEFT

## 2019-01-06 ENCOUNTER — Ambulatory Visit: Payer: BLUE CROSS/BLUE SHIELD | Admitting: Emergency Medicine

## 2019-01-15 ENCOUNTER — Encounter: Payer: Self-pay | Admitting: Emergency Medicine

## 2019-01-15 ENCOUNTER — Other Ambulatory Visit: Payer: Self-pay

## 2019-01-15 ENCOUNTER — Ambulatory Visit (INDEPENDENT_AMBULATORY_CARE_PROVIDER_SITE_OTHER): Payer: BC Managed Care – PPO | Admitting: Emergency Medicine

## 2019-01-15 VITALS — BP 132/82 | HR 88 | Temp 99.0°F | Resp 16 | Ht 65.0 in | Wt 243.6 lb

## 2019-01-15 DIAGNOSIS — F418 Other specified anxiety disorders: Secondary | ICD-10-CM | POA: Diagnosis not present

## 2019-01-15 DIAGNOSIS — F411 Generalized anxiety disorder: Secondary | ICD-10-CM | POA: Diagnosis not present

## 2019-01-15 MED ORDER — ESCITALOPRAM OXALATE 20 MG PO TABS: 20.0000 mg | ORAL_TABLET | Freq: Every day | ORAL | 3 refills | Status: DC

## 2019-01-15 NOTE — Patient Instructions (Addendum)
If you have lab work done today you will be contacted with your lab results within the next 2 weeks.  If you have not heard from [place] then please contact us. The fastest way to get your results is to register for My Chart.   IF you received an x-ray today, you will receive an invoice from [facility]. Please contact [facility] at [phone] with questions or concerns regarding your invoice.   IF you received labwork today, you will receive an invoice from [facility]. Please contact [facility] at [phone] with questions or concerns regarding your invoice.   Our billing staff will not be able to assist you with questions regarding bills from these companies.  You will be contacted with the lab results as soon as they are available. The fastest way to get your results is to activate your My Chart account. Instructions are located on the last page of this paperwork. If you have not heard from [place] regarding the results in 2 weeks, please contact this office.     Living With Anxiety  After being diagnosed with an anxiety disorder, you may be relieved to know why you have felt or behaved a certain way. It is natural to also feel overwhelmed about the treatment ahead and what it will mean for your life. With care and support, you can manage this condition and recover from it. How to cope with anxiety Dealing with stress Stress is your body's reaction to life changes and events, both good and bad. Stress can last just a few hours or it can be ongoing. Stress can play a major role in anxiety, so it is important to learn both how to cope with stress and how to think about it differently. Talk with your health care provider or a counselor to learn more about stress reduction. He or she may suggest some stress reduction techniques, such as:  Music therapy. This can include creating or listening to music that you enjoy and that inspires you.  Mindfulness-based meditation. This  involves being aware of your normal breaths, rather than trying to control your breathing. It can be done while sitting or walking.  Centering prayer. This is a kind of meditation that involves focusing on a word, phrase, or sacred image that is meaningful to you and that brings you peace.  Deep breathing. To do this, expand your stomach and inhale slowly through your nose. Hold your breath for 3-5 seconds. Then exhale slowly, allowing your stomach muscles to relax.  Self-talk. This is a skill where you identify thought patterns that lead to anxiety reactions and correct those thoughts.  Muscle relaxation. This involves tensing muscles then relaxing them. Choose a stress reduction technique that fits your lifestyle and personality. Stress reduction techniques take time and practice. Set aside 5-15 minutes a day to do them. Therapists can offer training in these techniques. The training may be covered by some insurance plans. Other things you can do to manage stress include:  Keeping a stress diary. This can help you learn what triggers your stress and ways to control your response.  Thinking about how you respond to certain situations. You may not be able to control everything, but you can control your reaction.  Making time for activities that help you relax, and not feeling guilty about spending your time in this way. Therapy combined with coping and stress-reduction skills provides the best chance for successful treatment. Medicines Medicines can help ease symptoms. Medicines for anxiety include:  Anti-anxiety drugs.  Antidepressants.  Beta-blockers. Medicines may be used as the main treatment for anxiety disorder, along with therapy, or if other treatments are not working. Medicines should be prescribed by a health care provider. Relationships Relationships can play a big part in helping you recover. Try to spend more time connecting with trusted friends and family members. Consider  going to couples counseling, taking family education classes, or going to family therapy. Therapy can help you and others better understand the condition. How to recognize changes in your condition Everyone has a different response to treatment for anxiety. Recovery from anxiety happens when symptoms decrease and stop interfering with your daily activities at home or work. This may mean that you will start to:  Have better concentration and focus.  Sleep better.  Be less irritable.  Have more energy.  Have improved memory. It is important to recognize when your condition is getting worse. Contact your health care provider if your symptoms interfere with home or work and you do not feel like your condition is improving. Where to find help and support: You can get help and support from these sources:  Self-help groups.  Online and [employer].  A trusted spiritual leader.  Couples counseling.  Family education classes.  Family therapy. Follow these instructions at home:  Eat a healthy diet that includes plenty of vegetables, fruits, whole grains, low-fat dairy products, and lean protein. Do not eat a lot of foods that are high in solid fats, added sugars, or salt.  Exercise. Most adults should do the following: ? Exercise for at least 150 minutes each week. The exercise should increase your heart rate and make you sweat (moderate-intensity exercise). ? Strengthening exercises at least twice a week.  Cut down on caffeine, tobacco, alcohol, and other potentially harmful substances.  Get the right amount and quality of sleep. Most adults need 7-9 hours of sleep each night.  Make choices that simplify your life.  Take over-the-counter and prescription medicines only as told by your health care provider.  Avoid caffeine, alcohol, and certain over-the-counter cold medicines. These may make you feel worse. Ask your pharmacist which medicines to avoid.  Keep all  follow-up visits as told by your health care provider. This is important. Questions to ask your health care provider  Would I benefit from therapy?  How often should I follow up with a health care provider?  How long do I need to take medicine?  Are there any long-term side effects of my medicine?  Are there any alternatives to taking medicine? Contact a health care provider if:  You have a hard time staying focused or finishing daily tasks.  You spend many hours a day feeling worried about everyday life.  You become exhausted by worry.  You start to have headaches, feel tense, or have nausea.  You urinate more than normal.  You have diarrhea. Get help right away if:  You have a racing heart and shortness of breath.  You have thoughts of hurting yourself or others. If you ever feel like you may hurt yourself or others, or have thoughts about taking your own life, get help right away. You can go to your nearest emergency department or call:  Your local emergency services (911 in the [place]  A suicide crisis helpline, such as the [facility] at 425-246-2225. This is open 24-hours a day. Summary  Taking steps to deal with stress can help calm you.  Medicines cannot cure anxiety disorders,  but they can help ease symptoms.  Family, friends, and partners can play a big part in helping you recover from an anxiety disorder. This information is not intended to replace advice given to you by your health care provider. Make sure you discuss any questions you have with your health care provider. Document Released: 06/20/2016 Document Revised: 06/08/2017 Document Reviewed: 06/20/2016 Elsevier Patient Education  2020 [employer].

## 2019-01-15 NOTE — Progress Notes (Signed)
Theresa Sawyer 38 y.o.   Chief Complaint  Patient presents with  . Anxiety    FOLLOW UP and medication review of anxiety medication    HISTORY OF PRESENT ILLNESS: This is a 38 y.o. female here for follow-up of anxiety.  Doing well.  On Lexapro 20 mg a day.  Taking Xanax only as needed, sparingly and judiciously.  Has no complaints or medical concerns today.  HPI   Prior to Admission medications   Medication Sig Start Date End Date Taking? Authorizing Provider  ALPRAZolam Theresa Sawyer) 0.5 MG tablet Take 1 tablet (0.5 mg total) by mouth 2 (two) times daily as needed for anxiety. 12/05/18  Yes Theresa Sawyer, Theresa Bloomer, MD  augmented betamethasone dipropionate (DIPROLENE-AF) 0.05 % cream Apply 1 application topically daily as needed. 12/05/18  Yes Theresa Sawyer, Theresa Bloomer, MD  escitalopram (LEXAPRO) 10 MG tablet TAKE 1 TABLET BY MOUTH EVERY DAY FOR 3 WEEKS. MAY TAKE AN ADDITIONAL 1/2 TABLET EVERY DAY 12/29/18  Yes Theresa Sawyer, Theresa Bloomer, MD  Menthol, Topical Analgesic, (ICY HOT EX) Apply 1 application topically daily as needed (for muscle aches/pain).   Yes [provider]  metoprolol tartrate (LOPRESSOR) 100 MG tablet Take 1 tablet by mouth 2 hours prior to Cardiac CT 04/23/18  Yes Theresa Booze, MD  Multiple Vitamins-Minerals (MULTIVITAMIN WITH MINERALS) tablet Take 1 tablet by mouth daily.   Yes [provider]  pantoprazole (PROTONIX) 40 MG tablet TAKE 1 TABLET(40 MG) BY MOUTH DAILY 05/20/18  Yes Theresa Sawyer, Theresa Mink, PA-C  Apremilast (OTEZLA) 30 MG TABS Take by mouth 2 (two) times a day.    [provider]  aspirin EC 81 MG tablet Take 81 mg by mouth daily.    [provider]  escitalopram (LEXAPRO) 5 MG tablet TAKE 1 TABLET BY MOUTH EVERY DAY, MAY INCREASE TO 2 TABLETS AFTER 3 WEEKS IF NEEDED Patient not taking: Reported on 01/15/2019 07/05/18   Theresa Sawyer, Theresa Mink, PA-C    No Known Allergies  Patient Active Problem List   Diagnosis Date Noted  .  Psoriasis 12/05/2018  . Situational anxiety 12/05/2018  . Intermittent hypertension 12/05/2018    Past Medical History:  Diagnosis Date  . Anxiety   . High cholesterol   . Sterilization consult 01/29/2013   BTL in hosp desired. Papers signed 12/16/12   . SVD (spontaneous vaginal delivery) 02/06/2013    Past Surgical History:  Procedure Laterality Date  . NO PAST SURGERIES    . TUBAL LIGATION Bilateral 02/05/2013   Procedure: POST PARTUM TUBAL LIGATION;  Surgeon: Theresa Mode, MD;  Location: [facility];  Service: Gynecology;  Laterality: Bilateral;    Social History   Socioeconomic History  . Marital status: Legally Separated    Spouse name: Not on file  . Number of children: Not on file  . Years of education: Not on file  . Highest education level: Not on file  Occupational History  . Not on file  Social Needs  . Financial resource strain: Not on file  . Food insecurity    Worry: Not on file    Inability: Not on file  . Transportation needs    Medical: Not on file    Non-medical: Not on file  Tobacco Use  . Smoking status: Current Some Day Smoker    Packs/day: 0.50    Types: Cigarettes  . Smokeless tobacco: Never Used  Substance and Sexual Activity  . Alcohol use: No  . Drug use: No  . Sexual activity: Yes  Birth control/protection: Surgical  Lifestyle  . Physical activity    Days per week: Not on file    Minutes per session: Not on file  . Stress: Not on file  Relationships  . Social Herbalist on phone: Not on file    Gets together: Not on file    Attends religious service: Not on file    Active member of club or organization: Not on file    Attends meetings of clubs or organizations: Not on file    Relationship status: Not on file  . Intimate partner violence    Fear of current or ex partner: Not on file    Emotionally abused: Not on file    Physically abused: Not on file    Forced sexual activity: Not on file  Other Topics Concern  . Not on  file  Social History Narrative  . Not on file    Family History  Problem Relation Age of Onset  . Diabetes Maternal Grandmother   . Diabetes Paternal Grandmother   . Anxiety disorder Mother   . Heart attack Brother   . Anxiety disorder Brother      Review of Systems  Constitutional: Negative.  Negative for chills and fever.  HENT: Negative.  Negative for congestion, nosebleeds and sore throat.   Eyes: Negative.   Respiratory: Negative.  Negative for cough.   Cardiovascular: Negative.   Gastrointestinal: Positive for heartburn. Negative for abdominal pain, diarrhea and vomiting.  Genitourinary: Negative.  Negative for dysuria.  Musculoskeletal: Negative for myalgias and neck pain.  Skin:       psoriasis  Neurological: Negative.  Negative for headaches.  All other systems reviewed and are negative.  Vitals:   01/15/19 1603  BP: 132/82  Pulse: 88  Resp: 16  Temp: 99 F (37.2 C)  SpO2: 97%     Physical Exam Vitals signs reviewed.  Constitutional:      Appearance: Normal appearance. She is obese.  HENT:     Head: Normocephalic and atraumatic.  Eyes:     Extraocular Movements: Extraocular movements intact.     Pupils: Pupils are equal, round, and reactive to light.  Neck:     Musculoskeletal: Normal range of motion.  Cardiovascular:     Rate and Rhythm: Normal rate and regular rhythm.     Heart sounds: Normal heart sounds.  Pulmonary:     Effort: Pulmonary effort is normal.     Breath sounds: Normal breath sounds.  Musculoskeletal: Normal range of motion.  Skin:    General: Skin is warm and dry.     Capillary Refill: Capillary refill takes less than 2 seconds.  Neurological:     General: No focal deficit present.     Mental Status: She is alert and oriented to person, place, and time.  Psychiatric:        Mood and Affect: Mood normal.        Behavior: Behavior normal.      ASSESSMENT & PLAN: Theresa Sawyer was seen today for anxiety.  Diagnoses and all orders  for this visit:  Generalized anxiety disorder -     escitalopram (LEXAPRO) 20 MG tablet; Take 1 tablet (20 mg total) by mouth daily.  Situational anxiety    Patient Instructions       If you have lab work done today you will be contacted with your lab results within the next 2 weeks.  If you have not heard from [place] then please contact  us. The fastest way to get your results is to register for My Chart.   IF you received an x-ray today, you will receive an invoice from [facility]. Please contact [facility] at [phone] with questions or concerns regarding your invoice.   IF you received labwork today, you will receive an invoice from [facility]. Please contact [facility] at [phone] with questions or concerns regarding your invoice.   Our billing staff will not be able to assist you with questions regarding bills from these companies.  You will be contacted with the lab results as soon as they are available. The fastest way to get your results is to activate your My Chart account. Instructions are located on the last page of this paperwork. If you have not heard from [place] regarding the results in 2 weeks, please contact this office.     Living With Anxiety  After being diagnosed with an anxiety disorder, you may be relieved to know why you have felt or behaved a certain way. It is natural to also feel overwhelmed about the treatment ahead and what it will mean for your life. With care and support, you can manage this condition and recover from it. How to cope with anxiety Dealing with stress Stress is your body's reaction to life changes and events, both good and bad. Stress can last just a few hours or it can be ongoing. Stress can play a major role in anxiety, so it is important to learn both how to cope with stress and how to think about it differently. Talk with your health care provider or a counselor to learn more about stress reduction. He or she may  suggest some stress reduction techniques, such as:  Music therapy. This can include creating or listening to music that you enjoy and that inspires you.  Mindfulness-based meditation. This involves being aware of your normal breaths, rather than trying to control your breathing. It can be done while sitting or walking.  Centering prayer. This is a kind of meditation that involves focusing on a word, phrase, or sacred image that is meaningful to you and that brings you peace.  Deep breathing. To do this, expand your stomach and inhale slowly through your nose. Hold your breath for 3-5 seconds. Then exhale slowly, allowing your stomach muscles to relax.  Self-talk. This is a skill where you identify thought patterns that lead to anxiety reactions and correct those thoughts.  Muscle relaxation. This involves tensing muscles then relaxing them. Choose a stress reduction technique that fits your lifestyle and personality. Stress reduction techniques take time and practice. Set aside 5-15 minutes a day to do them. Therapists can offer training in these techniques. The training may be covered by some insurance plans. Other things you can do to manage stress include:  Keeping a stress diary. This can help you learn what triggers your stress and ways to control your response.  Thinking about how you respond to certain situations. You may not be able to control everything, but you can control your reaction.  Making time for activities that help you relax, and not feeling guilty about spending your time in this way. Therapy combined with coping and stress-reduction skills provides the best chance for successful treatment. Medicines Medicines can help ease symptoms. Medicines for anxiety include:  Anti-anxiety drugs.  Antidepressants.  Beta-blockers. Medicines may be used as the main treatment for anxiety disorder, along with therapy, or if other treatments are not working. Medicines should be  prescribed by  a health care provider. Relationships Relationships can play a big part in helping you recover. Try to spend more time connecting with trusted friends and family members. Consider going to couples counseling, taking family education classes, or going to family therapy. Therapy can help you and others better understand the condition. How to recognize changes in your condition Everyone has a different response to treatment for anxiety. Recovery from anxiety happens when symptoms decrease and stop interfering with your daily activities at home or work. This may mean that you will start to:  Have better concentration and focus.  Sleep better.  Be less irritable.  Have more energy.  Have improved memory. It is important to recognize when your condition is getting worse. Contact your health care provider if your symptoms interfere with home or work and you do not feel like your condition is improving. Where to find help and support: You can get help and support from these sources:  Self-help groups.  Online and [employer].  A trusted spiritual leader.  Couples counseling.  Family education classes.  Family therapy. Follow these instructions at home:  Eat a healthy diet that includes plenty of vegetables, fruits, whole grains, low-fat dairy products, and lean protein. Do not eat a lot of foods that are high in solid fats, added sugars, or salt.  Exercise. Most adults should do the following: ? Exercise for at least 150 minutes each week. The exercise should increase your heart rate and make you sweat (moderate-intensity exercise). ? Strengthening exercises at least twice a week.  Cut down on caffeine, tobacco, alcohol, and other potentially harmful substances.  Get the right amount and quality of sleep. Most adults need 7-9 hours of sleep each night.  Make choices that simplify your life.  Take over-the-counter and prescription medicines only as told by  your health care provider.  Avoid caffeine, alcohol, and certain over-the-counter cold medicines. These may make you feel worse. Ask your pharmacist which medicines to avoid.  Keep all follow-up visits as told by your health care provider. This is important. Questions to ask your health care provider  Would I benefit from therapy?  How often should I follow up with a health care provider?  How long do I need to take medicine?  Are there any long-term side effects of my medicine?  Are there any alternatives to taking medicine? Contact a health care provider if:  You have a hard time staying focused or finishing daily tasks.  You spend many hours a day feeling worried about everyday life.  You become exhausted by worry.  You start to have headaches, feel tense, or have nausea.  You urinate more than normal.  You have diarrhea. Get help right away if:  You have a racing heart and shortness of breath.  You have thoughts of hurting yourself or others. If you ever feel like you may hurt yourself or others, or have thoughts about taking your own life, get help right away. You can go to your nearest emergency department or call:  Your local emergency services (911 in the [place]  A suicide crisis helpline, such as the [facility] at 567-101-4989. This is open 24-hours a day. Summary  Taking steps to deal with stress can help calm you.  Medicines cannot cure anxiety disorders, but they can help ease symptoms.  Family, friends, and partners can play a big part in helping you recover from an anxiety disorder. This information is not intended to replace advice given  to you by your health care provider. Make sure you discuss any questions you have with your health care provider. Document Released: 06/20/2016 Document Revised: 06/08/2017 Document Reviewed: 06/20/2016 Elsevier Patient Education  2020 [employer]      Agustina Caroli, MD [facility]

## 2019-01-19 DIAGNOSIS — G4733 Obstructive sleep apnea (adult) (pediatric): Secondary | ICD-10-CM | POA: Diagnosis not present

## 2019-01-30 ENCOUNTER — Ambulatory Visit: Payer: BLUE CROSS/BLUE SHIELD | Admitting: Gastroenterology

## 2019-02-04 ENCOUNTER — Other Ambulatory Visit: Payer: Self-pay | Admitting: Emergency Medicine

## 2019-02-04 DIAGNOSIS — F418 Other specified anxiety disorders: Secondary | ICD-10-CM

## 2019-02-04 NOTE — Telephone Encounter (Signed)
Requested medication (s) are due for refill today:yes  Requested medication (s) are on the active medication list: yes  Last refill:  12/05/2018  Future visit scheduled: yes  Notes to clinic: Medication not delegated    Requested Prescriptions  Pending Prescriptions Disp Refills   ALPRAZolam (XANAX) 0.5 MG tablet [Pharmacy Med Name: ALPRAZOLAM 0.5MG TABLETS] 30 tablet     Sig: TAKE 1 TABLET(0.5 MG) BY MOUTH TWICE DAILY AS NEEDED FOR ANXIETY     Not Delegated - Psychiatry:  Anxiolytics/Hypnotics Failed - 02/04/2019  4:32 PM      Failed - This refill cannot be delegated      Failed - Urine Drug Screen completed in last 360 days.      Passed - Valid encounter within last 6 months    Recent Outpatient Visits          2 weeks ago Generalized anxiety disorder   [facility] at [facility], Ines Bloomer, MD   2 months ago Situational anxiety   Primary Care at [facility], [place], MD   5 months ago Chest wall pain   Primary Care at [facility], MD   9 months ago Chest wall pain   Primary Care at [facility], Gelene Mink, [place]   10 months ago Generalized anxiety disorder   Primary Care at [facility], Gelene Mink, PA-C      Future Appointments            In 4 months Sagardia, Ines Bloomer, MD Primary Care at [place], [facility]

## 2019-02-05 NOTE — Telephone Encounter (Signed)
Requesting Xanax 0.5 bid

## 2019-02-18 ENCOUNTER — Other Ambulatory Visit: Payer: Self-pay | Admitting: Emergency Medicine

## 2019-02-18 DIAGNOSIS — L409 Psoriasis, unspecified: Secondary | ICD-10-CM

## 2019-02-18 NOTE — Telephone Encounter (Signed)
Forwarding medication refill request to clinical pool for review.

## 2019-02-19 DIAGNOSIS — G4733 Obstructive sleep apnea (adult) (pediatric): Secondary | ICD-10-CM | POA: Diagnosis not present

## 2019-02-20 ENCOUNTER — Other Ambulatory Visit: Payer: Self-pay

## 2019-02-20 ENCOUNTER — Emergency Department (HOSPITAL_COMMUNITY)
Admission: EM | Admit: 2019-02-20 | Discharge: 2019-02-21 | Disposition: A | Discharge status: Home / Self Care | Payer: BC Managed Care – PPO | Attending: Emergency Medicine | Admitting: Emergency Medicine

## 2019-02-20 DIAGNOSIS — Z79899 Other long term (current) drug therapy: Secondary | ICD-10-CM | POA: Diagnosis not present

## 2019-02-20 DIAGNOSIS — Z7982 Long term (current) use of aspirin: Secondary | ICD-10-CM | POA: Insufficient documentation

## 2019-02-20 DIAGNOSIS — R079 Chest pain, unspecified: Secondary | ICD-10-CM | POA: Diagnosis not present

## 2019-02-20 DIAGNOSIS — R0789 Other chest pain: Secondary | ICD-10-CM | POA: Diagnosis not present

## 2019-02-20 DIAGNOSIS — F17210 Nicotine dependence, cigarettes, uncomplicated: Secondary | ICD-10-CM | POA: Insufficient documentation

## 2019-02-21 ENCOUNTER — Encounter (HOSPITAL_COMMUNITY): Payer: Self-pay

## 2019-02-21 ENCOUNTER — Other Ambulatory Visit: Payer: Self-pay

## 2019-02-21 LAB — COMPREHENSIVE METABOLIC PANEL
ALT: 27 U/L (ref 0–44)
AST: 20 U/L (ref 15–41)
Albumin: 3.8 g/dL (ref 3.5–5.0)
Alkaline Phosphatase: 91 U/L (ref 38–126)
Anion gap: 8 (ref 5–15)
BUN: 12 mg/dL (ref 6–20)
CO2: 23 mmol/L (ref 22–32)
Calcium: 8.8 mg/dL — ABNORMAL LOW (ref 8.9–10.3)
Chloride: 106 mmol/L (ref 98–111)
Creatinine, Ser: 0.64 mg/dL (ref 0.44–1.00)
GFR calc Af Amer: >60 mL/min (ref >60)
GFR calc non Af Amer: >60 mL/min (ref >60)
Glucose, Bld: 137 mg/dL — ABNORMAL HIGH (ref 70–99)
Potassium: 3.3 mmol/L — ABNORMAL LOW (ref 3.5–5.1)
Sodium: 137 mmol/L (ref 135–145)
Total Bilirubin: <0.1 mg/dL — ABNORMAL LOW (ref 0.3–1.2)
Total Protein: 6.7 g/dL (ref 6.5–8.1)

## 2019-02-21 LAB — CBC WITH DIFFERENTIAL/PLATELET
Abs Immature Granulocytes: 0.14 10*3/uL — ABNORMAL HIGH (ref 0.00–0.07)
Basophils Absolute: 0.1 10*3/uL (ref 0.0–0.1)
Basophils Relative: 1 %
Eosinophils Absolute: 0.6 10*3/uL — ABNORMAL HIGH (ref 0.0–0.5)
Eosinophils Relative: 5 %
HCT: 34.7 % — ABNORMAL LOW (ref 36.0–46.0)
Hemoglobin: 10.6 g/dL — ABNORMAL LOW (ref 12.0–15.0)
Immature Granulocytes: 1 %
Lymphocytes Relative: 31 %
Lymphs Abs: 4.3 10*3/uL — ABNORMAL HIGH (ref 0.7–4.0)
MCH: 24.1 pg — ABNORMAL LOW (ref 26.0–34.0)
MCHC: 30.5 g/dL (ref 30.0–36.0)
MCV: 78.9 fL — ABNORMAL LOW (ref 80.0–100.0)
Monocytes Absolute: 1 10*3/uL (ref 0.1–1.0)
Monocytes Relative: 7 %
Neutro Abs: 7.9 10*3/uL — ABNORMAL HIGH (ref 1.7–7.7)
Neutrophils Relative %: 55 %
Platelets: 417 10*3/uL — ABNORMAL HIGH (ref 150–400)
RBC: 4.4 MIL/uL (ref 3.87–5.11)
RDW: 15.7 % — ABNORMAL HIGH (ref 11.5–15.5)
WBC: 14 10*3/uL — ABNORMAL HIGH (ref 4.0–10.5)
nRBC: 0 % (ref 0.0–0.2)

## 2019-02-21 LAB — URINALYSIS, ROUTINE W REFLEX MICROSCOPIC
Bilirubin Urine: NEGATIVE
Glucose, UA: NEGATIVE mg/dL
Hgb urine dipstick: NEGATIVE
Ketones, ur: NEGATIVE mg/dL
Leukocytes,Ua: NEGATIVE
Nitrite: NEGATIVE
Protein, ur: NEGATIVE mg/dL
Specific Gravity, Urine: 1.01 (ref 1.005–1.030)
pH: 7 (ref 5.0–8.0)

## 2019-02-21 LAB — LIPASE, BLOOD: Lipase: 43 U/L (ref 11–51)

## 2019-02-21 LAB — TROPONIN I (HIGH SENSITIVITY): Troponin I (High Sensitivity): <2 ng/L (ref <18)

## 2019-02-21 MED ORDER — TRAMADOL HCL 50 MG PO TABS: 50.0000 mg | ORAL_TABLET | Freq: Four times a day (QID) | ORAL | 0 refills | Status: DC | PRN

## 2019-02-21 MED ORDER — KETOROLAC TROMETHAMINE 30 MG/ML IJ SOLN
30.0000 mg | Freq: Once | INTRAMUSCULAR | Status: AC
  Administered 2019-02-21: 01:00:00 30 mg via INTRAVENOUS
  Filled 2019-02-21: qty 1

## 2019-02-21 MED ORDER — METHOCARBAMOL 500 MG PO TABS: 500.0000 mg | ORAL_TABLET | Freq: Three times a day (TID) | ORAL | 0 refills | Status: DC | PRN

## 2019-02-21 NOTE — ED Provider Notes (Signed)
[facility] EMERGENCY DEPARTMENT Provider Note   CSN: [id number] Arrival date & time: 02/20/19  2338    History   Chief Complaint Chief Complaint  Patient presents with  . Chest Pain  . Flank Pain    HPI Theresa Sawyer is a 38 y.o. female.  HPI: A 38 year old patient with a history of obesity presents for evaluation of chest pain. Initial onset of pain was more than 6 hours ago. The patient's chest pain is well-localized, is sharp and is not worse with exertion. The patient's chest pain is not middle- or left-sided, is not described as heaviness/pressure/tightness and does not radiate to the arms/jaw/neck. The patient does not complain of nausea and denies diaphoresis. The patient has no history of stroke, has no history of peripheral artery disease, has not smoked in the past 90 days, denies any history of treated diabetes, has no relevant family history of coronary artery disease (first degree relative at less than age 71), is not hypertensive and has no history of hypercholesterolemia.   Patient presents to the emergency department for evaluation of left-sided chest pain.  Symptoms have been present intermittently for more than a year.  She has had previous work-ups and has a GI consultation pending.  Tonight the pain worsened.  She reports severe, sharp pains in the lateral aspect of the left chest area with some radiation of the pain to the anterior chest.  Pain significantly worsens with movement and touching the area.     Past Medical History:  Diagnosis Date  . Anxiety   . High cholesterol   . Sterilization consult 01/29/2013   BTL in hosp desired. Papers signed 12/16/12   . SVD (spontaneous vaginal delivery) 02/06/2013    Patient Active Problem List   Diagnosis Date Noted  . Psoriasis 12/05/2018  . Situational anxiety 12/05/2018  . Intermittent hypertension 12/05/2018    Past Surgical History:  Procedure Laterality Date  . NO PAST SURGERIES    . TUBAL LIGATION  Bilateral 02/05/2013   Procedure: POST PARTUM TUBAL LIGATION;  Surgeon: Woodroe Mode, MD;  Location: [facility];  Service: Gynecology;  Laterality: Bilateral;     OB History    Gravida  3   Para  1   Term  1   Preterm  0   AB  2   Living  1     SAB  1   TAB  1   Ectopic  0   Multiple  0   Live Births  1            Home Medications    Prior to Admission medications   Medication Sig Start Date End Date Taking? Authorizing Provider  ALPRAZolam Duanne Moron) 0.5 MG tablet Take 1 tablet (0.5 mg total) by mouth daily as needed for anxiety. 02/05/19   Horald Pollen, MD  Apremilast (OTEZLA) 30 MG TABS Take by mouth 2 (two) times a day.    [provider]  aspirin EC 81 MG tablet Take 81 mg by mouth daily.    [provider]  augmented betamethasone dipropionate (DIPROLENE-AF) 0.05 % cream Apply 1 application topically daily as needed. 12/05/18   Horald Pollen, MD  escitalopram (LEXAPRO) 20 MG tablet Take 1 tablet (20 mg total) by mouth daily. 01/15/19 04/15/19  Horald Pollen, MD  Menthol, Topical Analgesic, (ICY HOT EX) Apply 1 application topically daily as needed (for muscle aches/pain).    [provider]  methocarbamol (ROBAXIN) 500 MG tablet  Take 1 tablet (500 mg total) by mouth every 8 (eight) hours as needed for muscle spasms. 02/21/19   Orpah Greek, MD  metoprolol tartrate (LOPRESSOR) 100 MG tablet Take 1 tablet by mouth 2 hours prior to Cardiac CT 04/23/18   Jettie Booze, MD  Multiple Vitamins-Minerals (MULTIVITAMIN WITH MINERALS) tablet Take 1 tablet by mouth daily.    [provider]  pantoprazole (PROTONIX) 40 MG tablet TAKE 1 TABLET(40 MG) BY MOUTH DAILY 05/20/18   McVey, Gelene Mink, PA-C  traMADol (ULTRAM) 50 MG tablet Take 1 tablet (50 mg total) by mouth every 6 (six) hours as needed. 02/21/19   Orpah Greek, MD    Family History Family History  Problem Relation Age of Onset  .  Diabetes Maternal Grandmother   . Diabetes Paternal Grandmother   . Anxiety disorder Mother   . Heart attack Brother   . Anxiety disorder Brother     Social History Social History   Tobacco Use  . Smoking status: Current Some Day Smoker    Packs/day: 0.50    Types: Cigarettes  . Smokeless tobacco: Never Used  Substance Use Topics  . Alcohol use: No  . Drug use: No     Allergies   Patient has no known allergies.   Review of Systems Review of Systems  Cardiovascular: Positive for chest pain.  All other systems reviewed and are negative.    Physical Exam Updated Vital Signs BP 124/79   Pulse 67   Temp 99.5 F (37.5 C) (Oral)   Resp (!) 22   Ht 5' 5"  (1.651 m)   Wt 117.9 kg   LMP 02/05/2019 (Approximate)   SpO2 100%   BMI 43.27 kg/m   Physical Exam Vitals signs and nursing note reviewed.  Constitutional:      General: She is not in acute distress.    Appearance: Normal appearance. She is well-developed.  HENT:     Head: Normocephalic and atraumatic.     Right Ear: Hearing normal.     Left Ear: Hearing normal.     Nose: Nose normal.  Eyes:     Conjunctiva/sclera: Conjunctivae normal.     Pupils: Pupils are equal, round, and reactive to light.  Neck:     Musculoskeletal: Normal range of motion and neck supple.  Cardiovascular:     Rate and Rhythm: Regular rhythm.     Heart sounds: S1 normal and S2 normal. No murmur. No friction rub. No gallop.   Pulmonary:     Effort: Pulmonary effort is normal. No respiratory distress.     Breath sounds: Normal breath sounds.  Chest:     Chest wall: Tenderness present.       Comments: Patient exhibits exquisite tenderness to the left lateral chest wall (area of her maximal pain) with some tenderness of the left upper anterior chest wall as well. Abdominal:     General: Bowel sounds are normal.     Palpations: Abdomen is soft.     Tenderness: There is no abdominal tenderness. There is no guarding or rebound.  Negative signs include Murphy's sign and McBurney's sign.     Hernia: No hernia is present.  Musculoskeletal: Normal range of motion.  Skin:    General: Skin is warm and dry.     Findings: No rash.  Neurological:     Mental Status: She is alert and oriented to person, place, and time.     GCS: GCS eye subscore is 4. GCS verbal  subscore is 5. GCS motor subscore is 6.     Cranial Nerves: No cranial nerve deficit.     Sensory: No sensory deficit.     Coordination: Coordination normal.  Psychiatric:        Speech: Speech normal.        Behavior: Behavior normal.        Thought Content: Thought content normal.      ED Treatments / Results  Labs (all labs ordered are listed, but only abnormal results are displayed) Labs Reviewed  CBC WITH DIFFERENTIAL/PLATELET - Abnormal; Notable for the following components:      Result Value   WBC 14.0 (*)    Hemoglobin 10.6 (*)    HCT 34.7 (*)    MCV 78.9 (*)    MCH 24.1 (*)    RDW 15.7 (*)    Platelets 417 (*)    Neutro Abs 7.9 (*)    Lymphs Abs 4.3 (*)    Eosinophils Absolute 0.6 (*)    Abs Immature Granulocytes 0.14 (*)    All other components within normal limits  COMPREHENSIVE METABOLIC PANEL - Abnormal; Notable for the following components:   Potassium 3.3 (*)    Glucose, Bld 137 (*)    Calcium 8.8 (*)    Total Bilirubin <0.1 (*)    All other components within normal limits  URINALYSIS, ROUTINE W REFLEX MICROSCOPIC - Abnormal; Notable for the following components:   Color, Urine STRAW (*)    All other components within normal limits  LIPASE, BLOOD  TROPONIN I (HIGH SENSITIVITY)    EKG EKG Interpretation  Date/Time:  Friday February 21 2019 00:07:39 EDT Ventricular Rate:  73 PR Interval:    QRS Duration: 97 QT Interval:  393 QTC Calculation: 433 R Axis:   59 Text Interpretation:  Sinus rhythm Normal ECG Confirmed by Orpah Greek [id number]) on 02/21/2019 12:43:03 AM   Radiology No results found.  Procedures  Procedures (including critical care time)  Medications Ordered in ED Medications  ketorolac (TORADOL) 30 MG/ML injection 30 mg (30 mg Intravenous Given 02/21/19 0100)     Initial Impression / Assessment and Plan / ED Course  I have reviewed the triage vital signs and the nursing notes.  Pertinent labs & imaging results that were available during my care of the patient were reviewed by me and considered in my medical decision making (see chart for details).     HEAR Score: 1  Patient presents to the emergency department for evaluation of chest pain.  Pain is actually in the lateral chest/flank area.  No known trauma.  No overlying skin changes to suggest zoster.  Area is exquisitely tender and pain is very reproducible.  Extremely low likelihood of cardiac etiology.  EKG normal.  Troponin negative.  Symptoms have been ongoing for a year and for the last couple of days have been more severe, does not require serial troponins. HEART score is 1.  Patient reassured, treat for chest wall pain.  Final Clinical Impressions(s) / ED Diagnoses   Final diagnoses:  Chest wall pain    ED Discharge Orders         Ordered    methocarbamol (ROBAXIN) 500 MG tablet  Every 8 hours PRN     02/21/19 0204    traMADol (ULTRAM) 50 MG tablet  Every 6 hours PRN     02/21/19 0204           Orpah Greek, MD 02/21/19 [id number]

## 2019-02-21 NOTE — ED Triage Notes (Signed)
Pt reports pain in the left chest, occasionally in the center.  Pt also reports pain and "swelling" to her left flank area, worse with movement.   Pt reports chronic GI problems with some of the similar symptoms in the past

## 2019-02-24 NOTE — Telephone Encounter (Signed)
Pt  Is requesting med that is not on med list, needs appt with Sagaradia. Thanks

## 2019-03-03 ENCOUNTER — Other Ambulatory Visit: Payer: Self-pay

## 2019-03-03 ENCOUNTER — Encounter: Payer: Self-pay | Admitting: Gastroenterology

## 2019-03-03 ENCOUNTER — Ambulatory Visit (INDEPENDENT_AMBULATORY_CARE_PROVIDER_SITE_OTHER): Payer: BC Managed Care – PPO | Admitting: Gastroenterology

## 2019-03-03 VITALS — BP 126/88 | HR 88 | Temp 98.4°F | Ht 65.0 in | Wt 237.6 lb

## 2019-03-03 DIAGNOSIS — R079 Chest pain, unspecified: Secondary | ICD-10-CM | POA: Diagnosis not present

## 2019-03-03 DIAGNOSIS — K219 Gastro-esophageal reflux disease without esophagitis: Secondary | ICD-10-CM | POA: Diagnosis not present

## 2019-03-03 MED ORDER — FAMOTIDINE 40 MG PO TABS: 40.0000 mg | ORAL_TABLET | Freq: Two times a day (BID) | ORAL | 3 refills | Status: DC

## 2019-03-03 MED ORDER — OMEPRAZOLE 40 MG PO CPDR: 40.0000 mg | DELAYED_RELEASE_CAPSULE | Freq: Every day | ORAL | 3 refills | Status: DC

## 2019-03-03 NOTE — Patient Instructions (Addendum)
Take your omeprazole every morning 30 minutes before breakfast You may use Pepcid up to twice daily for breakthrough symptoms Avoid any dietary triggers Avoid spicy and acidic foods Limit your intake of coffee, tea, alcohol, and carbonated drinks Work to maintain a healthy weight Keep the head of the bed elevated with blocks if you are having any nighttime symptoms Stay upright for 2 hours after eating Avoid meals and snacks three to four hours before bedtime Stop smoking. This makes reflux worse.   Will proceed with upper endoscopy after clearance from Dr. Irish Lack.

## 2019-03-03 NOTE — Progress Notes (Signed)
Referring Provider: Horald Pollen, * Primary Care Physician:  Horald Pollen, MD  Reason for Consultation:  Reflux   IMPRESSION:  Reflux despite pantoprazole 40 mg daily Atypical chest pain Left flank pain BMI of 39.54 Tobacco habituation  I have recommended an EGD with esophageal and gastric biopsies given the differential of reflux esophagitis, persistent H pylori, PUD, gastritis, and non-erosive reflux disease.  Her atypical chest pain may be related to GERD related dysmotility or other esophageal motility disorder.  Increase omeprazole to 40 mg twice daily.  Use Pepcid as needed for breakthrough pain.   Will proceed with endoscopy after clearance from cardiology given her incomplete evaluation.  I have made it clear that smoking exacerbates reflux as well as her risk for multiple GI cancers.  Smoking cessation recommended.  PLAN: Increase omeprazole to 40 mg twice daily  Use Pepcid 20 mg BID for breakthrough pain Reviewed lifestyle modifications EGD when cleared by cardiology Smoking cessation recommended Will ask Dr. Mitchel Honour about ? imaging of the left flank Follow-up with Dr. Irish Lack  Please see the "Patient Instructions" section for addition details about the plan.  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 She has a history of anxiety on Lexapro and Xanax, obesity, hypertriglyceridemia, and tobacco habituation.  Previously seen by cardiology. Dr. Irish Lack recommended.  She reports a one-year history of reflux that is progressively worse.  Occasionally feels food lodge at the sternal notch. She is able to cough the bolus. No odynophagia, dysphonia. Occassional sore throat. Appetite fluctuates. Unitentional weight gain although she thinks she is eating less.   She has had intermittent atypical, severe, midsternal chest pain requiring multiple trips to the emergency room.  No associated heaviness,  pressure, tightness, or radiation to the arms jaw or neck.  No diaphoresis.  She has been evaluated by Dr. Irish Lack who recommended a CTA of the coronaries to rule out coronary artery disease.  Although this was ordered October 2019 and has not yet been performed.  Last visit to the emergency room was 02/20/2019.  She had a normal CBC and CMP.  Urinalysis was normal.  Lipase was normal.  EKG was normal.  Tropinin was negative. She was treated with methocarbamol and tramadol.  It was recommended that she follow-up with cardiology.  No evidence for GI bleeding, iron deficiency anemia, anorexia, unexplained weight loss, odynophagia, persistent vomiting.  She is concerned that her left flank pain is related to her hernia. Symptoms worse after carrying heavy items at work.    Currently on pantroprazole for the last year. Occassionally uses Pepcid, generic H2B, Peptobismal, Tums and Rolaids.  No prior abdominal imaging. No prior endoscopy.  No NSAIDs.  Brother had a heart attack at age 39. Mother has severe GERD. Father had problems with his esophagus - details not further known.   No known family history of colon cancer or polyps. No family history of uterine/endometrial cancer, pancreatic cancer or gastric/stomach cancer.   Past Medical History:  Diagnosis Date  . Anxiety   . High cholesterol   . Sterilization consult 01/29/2013   BTL in hosp desired. Papers signed 12/16/12   . SVD (spontaneous vaginal delivery) 02/06/2013    Past Surgical History:  Procedure Laterality Date  . NO PAST SURGERIES    . TUBAL LIGATION Bilateral 02/05/2013   Procedure: POST PARTUM TUBAL LIGATION;  Surgeon: Woodroe Mode, MD;  Location: [facility];  Service: Gynecology;  Laterality:  Bilateral;    Current Outpatient Medications  Medication Sig Dispense Refill  . ALPRAZolam (XANAX) 0.5 MG tablet Take 1 tablet (0.5 mg total) by mouth daily as needed for anxiety. 30 tablet 0  . aspirin EC 81 MG tablet Take 81 mg by mouth  daily.    Marland Kitchen escitalopram (LEXAPRO) 20 MG tablet Take 1 tablet (20 mg total) by mouth daily. 90 tablet 3  . Menthol, Topical Analgesic, (ICY HOT EX) Apply 1 application topically daily as needed (for muscle aches/pain).    . methocarbamol (ROBAXIN) 500 MG tablet Take 1 tablet (500 mg total) by mouth every 8 (eight) hours as needed for muscle spasms. 20 tablet 0  . metoprolol tartrate (LOPRESSOR) 100 MG tablet Take 1 tablet by mouth 2 hours prior to Cardiac CT 1 tablet 0  . Multiple Vitamins-Minerals (MULTIVITAMIN WITH MINERALS) tablet Take 1 tablet by mouth daily.    . pantoprazole (PROTONIX) 40 MG tablet TAKE 1 TABLET(40 MG) BY MOUTH DAILY 90 tablet 3  . traMADol (ULTRAM) 50 MG tablet Take 1 tablet (50 mg total) by mouth every 6 (six) hours as needed. 15 tablet 0   No current facility-administered medications for this visit.     Allergies as of 03/03/2019  . (No Known Allergies)    Family History  Problem Relation Age of Onset  . Diabetes Maternal Grandmother   . Diabetes Paternal Grandmother   . Anxiety disorder Mother   . Heart attack Brother   . Anxiety disorder Brother     Social History   Socioeconomic History  . Marital status: Legally Separated    Spouse name: Not on file  . Number of children: Not on file  . Years of education: Not on file  . Highest education level: Not on file  Occupational History  . Not on file  Social Needs  . Financial resource strain: Not on file  . Food insecurity    Worry: Not on file    Inability: Not on file  . Transportation needs    Medical: Not on file    Non-medical: Not on file  Tobacco Use  . Smoking status: Current Some Day Smoker    Packs/day: 0.50    Types: Cigarettes  . Smokeless tobacco: Never Used  Substance and Sexual Activity  . Alcohol use: No  . Drug use: No  . Sexual activity: Yes    Birth control/protection: Surgical  Lifestyle  . Physical activity    Days per week: Not on file    Minutes per session: Not on  file  . Stress: Not on file  Relationships  . Social Herbalist on phone: Not on file    Gets together: Not on file    Attends religious service: Not on file    Active member of club or organization: Not on file    Attends meetings of clubs or organizations: Not on file    Relationship status: Not on file  . Intimate partner violence    Fear of current or ex partner: Not on file    Emotionally abused: Not on file    Physically abused: Not on file    Forced sexual activity: Not on file  Other Topics Concern  . Not on file  Social History Narrative  . Not on file    Review of Systems: 12 system ROS is negative except as noted above.   Physical Exam: General:   Alert,  well-nourished, pleasant and cooperative in NAD Head:  Normocephalic and atraumatic. Eyes:  Sclera clear, no icterus.   Conjunctiva pink. Ears:  Normal auditory acuity. Nose:  No deformity, discharge,  or lesions. Mouth:  No deformity or lesions.   Neck:  Supple; no masses or thyromegaly. Lungs:  Clear throughout to auscultation.   No wheezes.  Tenderness to the left lateral chest wall.  No overlying skin changes. Heart:  Regular rate and rhythm; no murmurs. Abdomen:  Soft,nontender, nondistended, normal bowel sounds, no rebound or guarding. No hepatosplenomegaly.   Rectal:  Deferred  Msk:  Symmetrical. No boney deformities LAD: No inguinal or umbilical LAD Extremities:  No clubbing or edema. Neurologic:  Alert and  oriented x4;  grossly nonfocal Skin:  Intact without significant lesions or rashes. Psych:  Alert and cooperative. Normal mood and affect.    Mahesh Sizemore L. Tarri Glenn, MD, MPH 03/03/2019, 3:34 PM

## 2019-03-06 NOTE — Telephone Encounter (Signed)
Scheduled

## 2019-03-08 ENCOUNTER — Other Ambulatory Visit: Payer: Self-pay | Admitting: Emergency Medicine

## 2019-03-08 DIAGNOSIS — L409 Psoriasis, unspecified: Secondary | ICD-10-CM

## 2019-03-09 ENCOUNTER — Other Ambulatory Visit: Payer: Self-pay | Admitting: Emergency Medicine

## 2019-03-09 DIAGNOSIS — F418 Other specified anxiety disorders: Secondary | ICD-10-CM

## 2019-03-10 NOTE — Telephone Encounter (Signed)
Requested medication (s) are due for refill today: yes  Requested medication (s) are on the active medication list: yes  Last refill:  02/05/2019 Future visit scheduled: yes  Notes to clinic:  This refill cannot be delegated   Requested Prescriptions  Pending Prescriptions Disp Refills   ALPRAZolam (XANAX) 0.5 MG tablet [Pharmacy Med Name: ALPRAZOLAM 0.5MG TABLETS] 30 tablet     Sig: TAKE 1 TABLET(0.5 MG) BY MOUTH DAILY AS NEEDED FOR ANXIETY     Not Delegated - Psychiatry:  Anxiolytics/Hypnotics Failed - 03/09/2019  2:12 PM      Failed - This refill cannot be delegated      Failed - Urine Drug Screen completed in last 360 days.      Passed - Valid encounter within last 6 months    Recent Outpatient Visits          1 month ago Generalized anxiety disorder   [facility] [place], Ines Bloomer, MD   3 months ago Situational anxiety   [facility] [place], [place], MD   6 months ago Chest wall pain   [facility] at [facility], MD   10 months ago Chest wall pain   [facility] at [facility], Gelene Mink, [place]   11 months ago Generalized anxiety disorder   [facility] at [facility], Gelene Mink, PA-C      Future Appointments            In 3 days Sagardia, Ines Bloomer, MD [facility] at [place], [place]   In 2 months [place], Ines Bloomer, MD [facility] at [place], [facility]

## 2019-03-12 NOTE — Telephone Encounter (Signed)
Please confirm   How often is the patient taken this medication?    Per note it isn't to be taken daily last refill 02-05-2019.  Does she need a refill?

## 2019-03-12 NOTE — Telephone Encounter (Signed)
Called left message for patient.    Prescription was last sent on 02-05-2019 was to be taken sparingly.  Should not be taking everyday.   How is the patient taking the medication?

## 2019-03-13 ENCOUNTER — Other Ambulatory Visit: Payer: Self-pay

## 2019-03-13 ENCOUNTER — Encounter: Payer: Self-pay | Admitting: Emergency Medicine

## 2019-03-13 ENCOUNTER — Ambulatory Visit (INDEPENDENT_AMBULATORY_CARE_PROVIDER_SITE_OTHER): Payer: BC Managed Care – PPO | Admitting: Emergency Medicine

## 2019-03-13 VITALS — BP 126/84 | HR 94 | Temp 98.7°F | Resp 20 | Ht 65.0 in | Wt 235.0 lb

## 2019-03-13 DIAGNOSIS — F411 Generalized anxiety disorder: Secondary | ICD-10-CM | POA: Diagnosis not present

## 2019-03-13 DIAGNOSIS — F418 Other specified anxiety disorders: Secondary | ICD-10-CM | POA: Diagnosis not present

## 2019-03-13 DIAGNOSIS — L409 Psoriasis, unspecified: Secondary | ICD-10-CM

## 2019-03-13 MED ORDER — ALPRAZOLAM 0.5 MG PO TABS: 0.5000 mg | ORAL_TABLET | Freq: Every day | ORAL | 0 refills | Status: DC | PRN

## 2019-03-13 MED ORDER — ESCITALOPRAM OXALATE 20 MG PO TABS: 20.0000 mg | ORAL_TABLET | Freq: Every day | ORAL | 3 refills | Status: DC

## 2019-03-13 MED ORDER — BETAMETHASONE DIPROPIONATE AUG 0.05 % EX CREA: 1.0000 | TOPICAL_CREAM | Freq: Every day | CUTANEOUS | 2 refills | Status: DC

## 2019-03-13 NOTE — Progress Notes (Signed)
Theresa Sawyer 38 y.o.   Chief Complaint  Patient presents with  . Rash    Psoriasis - needs refill of Betamethasone   . Anxiety    needs refill of Xanax   . Depression    PHQ9 +, worse than baseline lately, denies SI/HI     HISTORY OF PRESENT ILLNESS: This is a 38 y.o. female with history of psoriasis and depression.  Here for follow-up and medication refill. Psoriasis rash is flaring up with occasional joint pains.  Sees a dermatologist and is considering Humira. Increased anxiety lately due to pandemic and social unrest. No other complaints or medical concerns.  HPI   Prior to Admission medications   Medication Sig Start Date End Date Taking? Authorizing Provider  augmented betamethasone dipropionate (DIPROLENE-AF) 0.05 % cream Apply 1 Dose topically daily.   Yes [provider]  ALPRAZolam Duanne Moron) 0.5 MG tablet Take 1 tablet (0.5 mg total) by mouth daily as needed for anxiety. 02/05/19   Horald Pollen, MD  aspirin EC 81 MG tablet Take 81 mg by mouth as needed.     [provider]  Aspirin-Acetaminophen-Caffeine (GOODYS EXTRA STRENGTH PO) Take 1 Package by mouth as needed.    [provider]  escitalopram (LEXAPRO) 20 MG tablet Take 1 tablet (20 mg total) by mouth daily. 01/15/19 04/15/19  Horald Pollen, MD  famotidine (PEPCID) 40 MG tablet Take 1 tablet (40 mg total) by mouth 2 (two) times daily. 03/03/19   Thornton Park, MD  Multiple Vitamins-Minerals (MULTIVITAMIN WITH MINERALS) tablet Take 1 tablet by mouth daily.    [provider]  omeprazole (PRILOSEC) 40 MG capsule Take 1 capsule (40 mg total) by mouth daily. Patient not taking: Reported on 03/13/2019 03/03/19   Thornton Park, MD  pantoprazole (PROTONIX) 40 MG tablet TAKE 1 TABLET(40 MG) BY MOUTH DAILY 05/20/18   McVey, Gelene Mink, PA-C    No Known Allergies  Patient Active Problem List   Diagnosis Date Noted  . Psoriasis 12/05/2018  . Situational anxiety  12/05/2018  . Intermittent hypertension 12/05/2018    Past Medical History:  Diagnosis Date  . Anxiety   . GERD (gastroesophageal reflux disease)   . High cholesterol   . Psoriasis   . Sterilization consult 01/29/2013   BTL in hosp desired. Papers signed 12/16/12   . SVD (spontaneous vaginal delivery) 02/06/2013    Past Surgical History:  Procedure Laterality Date  . TUBAL LIGATION Bilateral 02/05/2013   Procedure: POST PARTUM TUBAL LIGATION;  Surgeon: Woodroe Mode, MD;  Location: [facility];  Service: Gynecology;  Laterality: Bilateral;    Social History   Socioeconomic History  . Marital status: Legally Separated    Spouse name: Not on file  . Number of children: Not on file  . Years of education: Not on file  . Highest education level: Not on file  Occupational History  . Not on file  Social Needs  . Financial resource strain: Not on file  . Food insecurity    Worry: Not on file    Inability: Not on file  . Transportation needs    Medical: Not on file    Non-medical: Not on file  Tobacco Use  . Smoking status: Current Some Day Smoker    Packs/day: 0.50    Types: Cigarettes  . Smokeless tobacco: Never Used  Substance and Sexual Activity  . Alcohol use: No  . Drug use: No  . Sexual activity: Yes    Birth control/protection:  Surgical  Lifestyle  . Physical activity    Days per week: Not on file    Minutes per session: Not on file  . Stress: Not on file  Relationships  . Social Herbalist on phone: Not on file    Gets together: Not on file    Attends religious service: Not on file    Active member of club or organization: Not on file    Attends meetings of clubs or organizations: Not on file    Relationship status: Not on file  . Intimate partner violence    Fear of current or ex partner: Not on file    Emotionally abused: Not on file    Physically abused: Not on file    Forced sexual activity: Not on file  Other Topics Concern  . Not on file   Social History Narrative  . Not on file    Family History  Problem Relation Age of Onset  . Diabetes Maternal Grandmother   . Diabetes Paternal Grandmother   . Anxiety disorder Mother   . Heart attack Brother 34       deceased from this  . Anxiety disorder Brother   . Cirrhosis Maternal Grandfather        alcohol related  . Colon cancer Neg Hx   . Esophageal cancer Neg Hx   . Colon polyps Neg Hx   . Stomach cancer Neg Hx   . Pancreatic cancer Neg Hx      Review of Systems  Constitutional: Negative.  Negative for chills and fever.  HENT: Negative.  Negative for congestion and sore throat.   Respiratory: Negative.  Negative for cough and shortness of breath.   Cardiovascular: Negative.  Negative for chest pain and palpitations.  Gastrointestinal: Negative.  Negative for abdominal pain, diarrhea, nausea and vomiting.  Genitourinary: Negative.   Musculoskeletal: Positive for joint pain. Negative for myalgias.  Skin: Positive for rash.  Neurological: Negative for dizziness and headaches.  All other systems reviewed and are negative.  Today's Vitals   03/13/19 0929  BP: 126/84  Pulse: 94  Resp: 20  Temp: 98.7 F (37.1 C)  TempSrc: Oral  SpO2: 100%  Weight: 235 lb (106.6 kg)  Height: 5' 5"  (1.651 m)   Body mass index is 39.11 kg/m.   Physical Exam Vitals signs reviewed.  Constitutional:      Appearance: She is obese.  HENT:     Head: Normocephalic.  Eyes:     Extraocular Movements: Extraocular movements intact.     Pupils: Pupils are equal, round, and reactive to light.  Neck:     Musculoskeletal: Normal range of motion.  Cardiovascular:     Rate and Rhythm: Normal rate and regular rhythm.     Heart sounds: Normal heart sounds.  Pulmonary:     Effort: Pulmonary effort is normal.     Breath sounds: Normal breath sounds.  Musculoskeletal: Normal range of motion.  Skin:    Findings: Rash present.     Comments: Typical psoriatic patches to elbows and knees  areas  Neurological:     General: No focal deficit present.     Mental Status: She is alert and oriented to person, place, and time.  Psychiatric:        Mood and Affect: Mood normal.        Behavior: Behavior normal.    A total of 25 minutes was spent in the room with the patient, greater than 50% of  which was in counseling/coordination of care regarding chronic medical problems including psoriasis and generalized anxiety disorder, treatment as well as coping mechanisms to deal with stress, medications and side effects, need for follow-up with her dermatologist, prognosis, diet and nutrition, and need for follow-up.   ASSESSMENT & PLAN: Michella was seen today for rash, anxiety and depression.  Diagnoses and all orders for this visit:  Psoriasis -     augmented betamethasone dipropionate (DIPROLENE-AF) 0.05 % cream; Apply 1 Dose topically daily.  Generalized anxiety disorder -     escitalopram (LEXAPRO) 20 MG tablet; Take 1 tablet (20 mg total) by mouth daily.  Situational anxiety -     ALPRAZolam (XANAX) 0.5 MG tablet; Take 1 tablet (0.5 mg total) by mouth daily as needed for anxiety.    Patient Instructions  Psoriasis Psoriasis is a long-term (chronic) skin condition. It occurs because your body's defense system (immune system) causes skin cells to form too quickly. This causes raised, red patches (plaques) on your skin that look silvery. The patches may be on all areas of your body. They can be any size or shape. Psoriasis can come and go. It can range from mild to very bad. It cannot be passed from one person to another (is not contagious). There is no cure for this condition, but it can be helped with treatment. What are the causes? The cause of psoriasis is not known. Some things can make it worse. These are:  Skin damage, such as cuts, scrapes, sunburn, and dryness.  Not getting enough sunlight.  Some medicines.  Alcohol.  Tobacco.  Stress.  Infections. What  increases the risk?  Having a family member with psoriasis.  Being very overweight (obese).  Being 38-36 years old.  Taking certain medicines. What are the signs or symptoms? There are different types of psoriasis. The types are:  Plaque. This is the most common. Symptoms include red, raised patches with a silvery coating. These may be itchy. Your nails may be crumbly or fall off.  Guttate. Symptoms include small red spots on your stomach area, arms, and legs. These may happen after you have been sick, such as with strep throat.  Inverse. Symptoms include patches in your armpits, under your breasts, private areas, or on your butt.  Pustular. Symptoms include pus-filled bumps on the palms of your hands or the soles of your feet. You also may feel very tired, weak, have a fever, and not be hungry.  Erythrodermic. Symptoms include bright red skin that looks burned. You may have a fast heartbeat and a body temperature that is too high or too low. You may be itchy or in pain.  Sebopsoriasis. Symptoms include red patches on your scalp, forehead, and face that are greasy.  Psoriatic arthritis. Symptoms include swollen, painful joints along with scaly skin patches. How is this treated? There is no cure for this condition, but treatment can:  Help your skin heal.  Lessen itching and irritation and swelling (inflammation).  Slow the growth of new skin cells.  Help your body's defense system respond better to your skin. Treatment may include:  Creams or ointments.  Light therapy. This may include natural sunlight or light therapy in a doctor's office.  Medicines. These can help your body better manage skin cells. They may be used with light therapy or ointments. Medicines may include pills or injections. You may also get antibiotic medicines if you have an infection. Follow these instructions at home: Skin Care  Apply lotion to  your skin as needed. Only use those that your doctor  has said are okay.  Apply cool, wet cloths (cold compresses) to the affected areas.  Do not use a hot tub or take hot showers. Use slightly warm, not hot, water when taking showers and baths.  Do not scratch your skin. Lifestyle   Do not use any products that contain nicotine or tobacco, such as cigarettes, e-cigarettes, and chewing tobacco. If you need help quitting, ask your doctor.  Lower your stress.  Keep a healthy weight.  Go out in the sun as told by your doctor. Do not get sunburned.  Join a support group. Medicines  Take or use over-the-counter and prescription medicines only as told by your doctor.  If you were prescribed an antibiotic medicine, take it as told by your doctor. Do not stop using the antibiotic even if you start to feel better. Alcohol use If you drink alcohol:  Limit how much you use: ? 0-1 drink a day for women. ? 0-2 drinks a day for men.  Be aware of how much alcohol is in your drink. In the [place] one drink equals one 12 oz bottle of beer (355 mL), one 5 oz glass of wine (148 mL), or one 1 oz glass of hard liquor (44 mL). General instructions  Keep a journal to track the things that cause symptoms (triggers). Try to avoid these things.  See a counselor if you feel the support would help.  Keep all follow-up visits as told by your doctor. This is important. Contact a doctor if:  You have a fever.  Your pain gets worse.  You have more redness or warmth in the affected areas.  You have new or worse pain or stiffness in your joints.  Your nails start to break easily or pull away from the nail bed.  You feel very sad (depressed). Summary  Psoriasis is a long-term (chronic) skin condition.  There is no cure for this condition, but treatment can help manage it.  Keep a journal to track the things that cause symptoms.  Take or use over-the-counter and prescription medicines only as told by your doctor.  Keep all follow-up visits as  told by your doctor. This is important. This information is not intended to replace advice given to you by your health care provider. Make sure you discuss any questions you have with your health care provider. Document Released: 08/03/2004 Document Revised: 04/30/2018 Document Reviewed: 04/30/2018 Elsevier Patient Education  2020 Elsevier Inc.      Agustina Caroli, MD [facility]

## 2019-03-13 NOTE — Patient Instructions (Signed)
Psoriasis Psoriasis is a long-term (chronic) skin condition. It occurs because your body's defense system (immune system) causes skin cells to form too quickly. This causes raised, red patches (plaques) on your skin that look silvery. The patches may be on all areas of your body. They can be any size or shape. Psoriasis can come and go. It can range from mild to very bad. It cannot be passed from one person to another (is not contagious). There is no cure for this condition, but it can be helped with treatment. What are the causes? The cause of psoriasis is not known. Some things can make it worse. These are:  Skin damage, such as cuts, scrapes, sunburn, and dryness.  Not getting enough sunlight.  Some medicines.  Alcohol.  Tobacco.  Stress.  Infections. What increases the risk?  Having a family member with psoriasis.  Being very overweight (obese).  Being 42-37 years old.  Taking certain medicines. What are the signs or symptoms? There are different types of psoriasis. The types are:  Plaque. This is the most common. Symptoms include red, raised patches with a silvery coating. These may be itchy. Your nails may be crumbly or fall off.  Guttate. Symptoms include small red spots on your stomach area, arms, and legs. These may happen after you have been sick, such as with strep throat.  Inverse. Symptoms include patches in your armpits, under your breasts, private areas, or on your butt.  Pustular. Symptoms include pus-filled bumps on the palms of your hands or the soles of your feet. You also may feel very tired, weak, have a fever, and not be hungry.  Erythrodermic. Symptoms include bright red skin that looks burned. You may have a fast heartbeat and a body temperature that is too high or too low. You may be itchy or in pain.  Sebopsoriasis. Symptoms include red patches on your scalp, forehead, and face that are greasy.  Psoriatic arthritis. Symptoms include swollen,  painful joints along with scaly skin patches. How is this treated? There is no cure for this condition, but treatment can:  Help your skin heal.  Lessen itching and irritation and swelling (inflammation).  Slow the growth of new skin cells.  Help your body's defense system respond better to your skin. Treatment may include:  Creams or ointments.  Light therapy. This may include natural sunlight or light therapy in a doctor's office.  Medicines. These can help your body better manage skin cells. They may be used with light therapy or ointments. Medicines may include pills or injections. You may also get antibiotic medicines if you have an infection. Follow these instructions at home: Skin Care  Apply lotion to your skin as needed. Only use those that your doctor has said are okay.  Apply cool, wet cloths (cold compresses) to the affected areas.  Do not use a hot tub or take hot showers. Use slightly warm, not hot, water when taking showers and baths.  Do not scratch your skin. Lifestyle   Do not use any products that contain nicotine or tobacco, such as cigarettes, e-cigarettes, and chewing tobacco. If you need help quitting, ask your doctor.  Lower your stress.  Keep a healthy weight.  Go out in the sun as told by your doctor. Do not get sunburned.  Join a support group. Medicines  Take or use over-the-counter and prescription medicines only as told by your doctor.  If you were prescribed an antibiotic medicine, take it as told by your doctor.  Do not stop using the antibiotic even if you start to feel better. Alcohol use If you drink alcohol:  Limit how much you use: ? 0-1 drink a day for women. ? 0-2 drinks a day for men.  Be aware of how much alcohol is in your drink. In the [place] one drink equals one 12 oz bottle of beer (355 mL), one 5 oz glass of wine (148 mL), or one 1 oz glass of hard liquor (44 mL). General instructions  Keep a journal to track the  things that cause symptoms (triggers). Try to avoid these things.  See a counselor if you feel the support would help.  Keep all follow-up visits as told by your doctor. This is important. Contact a doctor if:  You have a fever.  Your pain gets worse.  You have more redness or warmth in the affected areas.  You have new or worse pain or stiffness in your joints.  Your nails start to break easily or pull away from the nail bed.  You feel very sad (depressed). Summary  Psoriasis is a long-term (chronic) skin condition.  There is no cure for this condition, but treatment can help manage it.  Keep a journal to track the things that cause symptoms.  Take or use over-the-counter and prescription medicines only as told by your doctor.  Keep all follow-up visits as told by your doctor. This is important. This information is not intended to replace advice given to you by your health care provider. Make sure you discuss any questions you have with your health care provider. Document Released: 08/03/2004 Document Revised: 04/30/2018 Document Reviewed: 04/30/2018 Elsevier Patient Education  2020 [employer].

## 2019-03-22 DIAGNOSIS — G4733 Obstructive sleep apnea (adult) (pediatric): Secondary | ICD-10-CM | POA: Diagnosis not present

## 2019-04-07 ENCOUNTER — Other Ambulatory Visit: Payer: Self-pay | Admitting: Emergency Medicine

## 2019-04-07 DIAGNOSIS — F418 Other specified anxiety disorders: Secondary | ICD-10-CM

## 2019-04-07 NOTE — Telephone Encounter (Signed)
Requested medication (s) are due for refill today: yes  Requested medication (s) are on the active medication list: yes  Last refill:  03/13/2019  Future visit scheduled: yes  Notes to clinic:  Refill cannot be delegated    Requested Prescriptions  Pending Prescriptions Disp Refills   ALPRAZolam (XANAX) 0.5 MG tablet [Pharmacy Med Name: ALPRAZOLAM 0.5MG TABLETS] 30 tablet     Sig: TAKE 1 TABLET(0.5 MG) BY MOUTH DAILY AS NEEDED FOR ANXIETY     Not Delegated - Psychiatry:  Anxiolytics/Hypnotics Failed - 04/07/2019  8:47 AM      Failed - This refill cannot be delegated      Failed - Urine Drug Screen completed in last 360 days.      Passed - Valid encounter within last 6 months    Recent Outpatient Visits          3 weeks ago Psoriasis   [facility] Care at [facility], Ines Bloomer, MD   2 months ago Generalized anxiety disorder   Primary Care at [facility], Ines Bloomer, MD   4 months ago Situational anxiety   Primary [facility] at [place], [place], MD   7 months ago Chest wall pain   Primary Care at [facility], MD   11 months ago Chest wall pain   Primary Care at [facility], Gelene Mink, PA-C      Future Appointments            In 1 month Sagardia, Ines Bloomer, MD Primary Care at [place], [place]   In [place] months [place], Ines Bloomer, MD Primary Care at [place], [facility]

## 2019-04-08 NOTE — Telephone Encounter (Signed)
Patient is requesting a refill of the following medications: Requested Prescriptions   Pending Prescriptions Disp Refills  . ALPRAZolam (XANAX) 0.5 MG tablet [Pharmacy Med Name: ALPRAZOLAM 0.5MG TABLETS] 30 tablet     Sig: TAKE 1 TABLET(0.5 MG) BY MOUTH DAILY AS NEEDED FOR ANXIETY    Date of patient request: 04/07/19 Last office visit: 03/13/2019 Date of last refill: 03/13/2019 Last refill amount: 30 Follow up time period per chart: 06/04/2019

## 2019-04-21 DIAGNOSIS — G4733 Obstructive sleep apnea (adult) (pediatric): Secondary | ICD-10-CM | POA: Diagnosis not present

## 2019-04-24 ENCOUNTER — Encounter: Payer: Self-pay | Admitting: Family Medicine

## 2019-04-24 ENCOUNTER — Ambulatory Visit: Payer: BC Managed Care – PPO | Admitting: Family Medicine

## 2019-04-24 ENCOUNTER — Other Ambulatory Visit: Payer: Self-pay

## 2019-04-24 VITALS — BP 142/92 | HR 88 | Temp 97.9°F | Ht 65.0 in | Wt 236.6 lb

## 2019-04-24 DIAGNOSIS — Z9989 Dependence on other enabling machines and devices: Secondary | ICD-10-CM

## 2019-04-24 DIAGNOSIS — G4733 Obstructive sleep apnea (adult) (pediatric): Secondary | ICD-10-CM | POA: Diagnosis not present

## 2019-04-24 NOTE — Patient Instructions (Signed)
Continue nightly use and for greater than 4 hours each night.   We will order a mask refitting to help address leak. Please make sure mask is well fitting and headgear is snug.   We will repeat download in 6 weeks to reassess.   Follow up in 1 year, sooner if needed.   CPAP and BPAP Information CPAP and BPAP are methods of helping a person breathe with the use of air pressure. CPAP stands for "continuous positive airway pressure." BPAP stands for "bi-level positive airway pressure." In both methods, air is blown through your nose or mouth and into your air passages to help you breathe well. CPAP and BPAP use different amounts of pressure to blow air. With CPAP, the amount of pressure stays the same while you breathe in and out. With BPAP, the amount of pressure is increased when you breathe in (inhale) so that you can take larger breaths. Your health care provider will recommend whether CPAP or BPAP would be more helpful for you. Why are CPAP and BPAP treatments used? CPAP or BPAP can be helpful if you have:  Sleep apnea.  Chronic obstructive pulmonary disease (COPD).  Heart failure.  Medical conditions that weaken the muscles of the chest including muscular dystrophy, or neurological diseases such as amyotrophic lateral sclerosis (ALS).  Other problems that cause breathing to be weak, abnormal, or difficult. CPAP is most commonly used for obstructive sleep apnea (OSA) to keep the airways from collapsing when the muscles relax during sleep. How is CPAP or BPAP administered? Both CPAP and BPAP are provided by a small machine with a flexible plastic tube that attaches to a plastic mask. You wear the mask. Air is blown through the mask into your nose or mouth. The amount of pressure that is used to blow the air can be adjusted on the machine. Your health care provider will determine the pressure setting that should be used based on your individual needs. When should CPAP or BPAP be used? In  most cases, the mask only needs to be worn during sleep. Generally, the mask needs to be worn throughout the night and during any daytime naps. People with certain medical conditions may also need to wear the mask at other times when they are awake. Follow instructions from your health care provider about when to use the machine. What are some tips for using the mask?   Because the mask needs to be snug, some people feel trapped or closed-in (claustrophobic) when first using the mask. If you feel this way, you may need to get used to the mask. One way to do this is by holding the mask loosely over your nose or mouth and then gradually applying the mask more snugly. You can also gradually increase the amount of time that you use the mask.  Masks are available in various types and sizes. Some fit over your mouth and nose while others fit over just your nose. If your mask does not fit well, talk with your health care provider about getting a different one.  If you are using a mask that fits over your nose and you tend to breathe through your mouth, a chin strap may be applied to help keep your mouth closed.  The CPAP and BPAP machines have alarms that may sound if the mask comes off or develops a leak.  If you have trouble with the mask, it is very important that you talk with your health care provider about finding a way  to make the mask easier to tolerate. Do not stop using the mask. Stopping the use of the mask could have a negative impact on your health. What are some tips for using the machine?  Place your CPAP or BPAP machine on a secure table or stand near an electrical outlet.  Know where the on/off switch is located on the machine.  Follow instructions from your health care provider about how to set the pressure on your machine and when you should use it.  Do not eat or drink while the CPAP or BPAP machine is on. Food or fluids could get pushed into your lungs by the pressure of the CPAP  or BPAP.  Do not smoke. Tobacco smoke residue can damage the machine.  For home use, CPAP and BPAP machines can be rented or purchased through home health care companies. Many different brands of machines are available. Renting a machine before purchasing may help you find out which particular machine works well for you.  Keep the CPAP or BPAP machine and attachments clean. Ask your health care provider for specific instructions. Get help right away if:  You have redness or open areas around your nose or mouth where the mask fits.  You have trouble using the CPAP or BPAP machine.  You cannot tolerate wearing the CPAP or BPAP mask.  You have pain, discomfort, and bloating in your abdomen. Summary  CPAP and BPAP are methods of helping a person breathe with the use of air pressure.  Both CPAP and BPAP are provided by a small machine with a flexible plastic tube that attaches to a plastic mask.  If you have trouble with the mask, it is very important that you talk with your health care provider about finding a way to make the mask easier to tolerate. This information is not intended to replace advice given to you by your health care provider. Make sure you discuss any questions you have with your health care provider. Document Released: 03/24/2004 Document Revised: 10/16/2018 Document Reviewed: 05/15/2016 Elsevier Patient Education  [place]. Sleep Apnea Sleep apnea affects breathing during sleep. It causes breathing to stop for a short time or to become shallow. It can also increase the risk of:  Heart attack.  Stroke.  Being very overweight (obese).  Diabetes.  Heart failure.  Irregular heartbeat. The goal of treatment is to help you breathe normally again. What are the causes? There are three kinds of sleep apnea:  Obstructive sleep apnea. This is caused by a blocked or collapsed airway.  Central sleep apnea. This happens when the brain does not send the right  signals to the muscles that control breathing.  Mixed sleep apnea. This is a combination of obstructive and central sleep apnea. The most common cause of this condition is a collapsed or blocked airway. This can happen if:  Your throat muscles are too relaxed.  Your tongue and tonsils are too large.  You are overweight.  Your airway is too small. What increases the risk?  Being overweight.  Smoking.  Having a small airway.  Being older.  Being female.  Drinking alcohol.  Taking medicines to calm yourself (sedatives or tranquilizers).  Having family members with the condition. What are the signs or symptoms?  Trouble staying asleep.  Being sleepy or tired during the day.  Getting angry a lot.  Loud snoring.  Headaches in the morning.  Not being able to focus your mind (concentrate).  Forgetting things.  Less interest  in sex.  Mood swings.  Personality changes.  Feelings of sadness (depression).  Waking up a lot during the night to pee (urinate).  Dry mouth.  Sore throat. How is this diagnosed?  Your medical history.  A physical exam.  A test that is done when you are sleeping (sleep study). The test is most often done in a sleep lab but may also be done at home. How is this treated?   Sleeping on your side.  Using a medicine to get rid of mucus in your nose (decongestant).  Avoiding the use of alcohol, medicines to help you relax, or certain pain medicines (narcotics).  Losing weight, if needed.  Changing your diet.  Not smoking.  Using a machine to open your airway while you sleep, such as: ? An oral appliance. This is a mouthpiece that shifts your lower jaw forward. ? A CPAP device. This device blows air through a mask when you breathe out (exhale). ? An EPAP device. This has valves that you put in each nostril. ? A BPAP device. This device blows air through a mask when you breathe in (inhale) and breathe out.  Having surgery if  other treatments do not work. It is important to get treatment for sleep apnea. Without treatment, it can lead to:  High blood pressure.  Coronary artery disease.  In men, not being able to have an erection (impotence).  Reduced thinking ability. Follow these instructions at home: Lifestyle  Make changes that your doctor recommends.  Eat a healthy diet.  Lose weight if needed.  Avoid alcohol, medicines to help you relax, and some pain medicines.  Do not use any products that contain nicotine or tobacco, such as cigarettes, e-cigarettes, and chewing tobacco. If you need help quitting, ask your doctor. General instructions  Take over-the-counter and prescription medicines only as told by your doctor.  If you were given a machine to use while you sleep, use it only as told by your doctor.  If you are having surgery, make sure to tell your doctor you have sleep apnea. You may need to bring your device with you.  Keep all follow-up visits as told by your doctor. This is important. Contact a doctor if:  The machine that you were given to use during sleep bothers you or does not seem to be working.  You do not get better.  You get worse. Get help right away if:  Your chest hurts.  You have trouble breathing in enough air.  You have an uncomfortable feeling in your back, arms, or stomach.  You have trouble talking.  One side of your body feels weak.  A part of your face is hanging down. These symptoms may be an emergency. Do not wait to see if the symptoms will go away. Get medical help right away. Call your local emergency services (911 in the [place] Do not drive yourself to the hospital. Summary  This condition affects breathing during sleep.  The most common cause is a collapsed or blocked airway.  The goal of treatment is to help you breathe normally while you sleep. This information is not intended to replace advice given to you by your health care provider.  Make sure you discuss any questions you have with your health care provider. Document Released: 04/04/2008 Document Revised: 04/12/2018 Document Reviewed: 02/19/2018 Elsevier Patient Education  2020 [employer].

## 2019-04-24 NOTE — Progress Notes (Addendum)
PATIENT: Theresa Sawyer DOB: Jul 25, 1980  REASON FOR VISIT: follow up HISTORY FROM: patient  Chief Complaint  Patient presents with  . Follow-up    Room 2, alone. OSA/CPAP f/u. "Toss and turn too much, broke some of the pieces. The mask has a slit in it."     HISTORY OF PRESENT ILLNESS: Today 04/24/19 Theresa Sawyer is a 38 y.o. female here today for follow up for moderate OSA on CPAP.  She started CPAP therapy in March.  She reports that initially she was doing very well.  She felt that her full facemask fit great.  Over the past couple of months, she has noted that there has been more air coming from the bottom of her mask.  She also reports that she tosses and turns throughout the night and will often wake to note that tubing has been disconnected from CPAP.  She has also noted that her headgear is not as tight as it used to be.  Otherwise she is doing very well.  She definitely notes benefit to CPAP therapy.  She has less tired and is sleeping better.  Compliance report dated 03/24/2019 through 04/22/2019 reveals that she use CPAP 30 out of the last 30 days for compliance of 100%.  26 days she used CPAP greater than 4 hours for compliance of 87%.  Average usage was 5 hours and 38 minutes.  AHI was 4.2 on 7 to 13 cm of water and an EPR of 2.  There was a significant leak noted in the 95th percentile of 23.9.  HISTORY: (copied from Dr Guadelupe Sabin note on 04/11/2018)  Dear Benjamine Mola,   I saw your patient, Ericah Scotto, upon your kind request in my clinic today for initial consultation of her sleep disorder, in particular, concern for underlying obstructive sleep apnea. The patient is unaccompanied today. As you know, Ms. Hack is a 38 year old right-handed woman with an underlying medical history of chest pain, anxiety, psoriasis, and psoriatic arthritis, smoking, reflux disease and obesity, who reports snoring and excessive daytime somnolence. I reviewed your office note from 02/19/2018. Her  Epworth sleepiness score is 12 out of 24, fatigue score is 28 out of 63. She lives with her mother, her son and boyfriend. She smokes less than 1 ppd, no EtOH, and caffeine in the form of soda, about 3 bottles per day on average. Bedtime is around 11, sometimes earlier. Rise time around 4:30 on some days and 8:00 latest, depending on her work schedule. She works as a Educational psychologist.she has a strong family history of sleep apnea in her mother, her older brother who passed away at 76 from a massive heart attack, sister age 16, maternal grandmother and maternal uncles as well. Weight has been fairly stable. She is trying to quit smoking and reducing her caffeine intake. She has a 61-year-old goes to daycare. She has nocturia about once per average night and has had occasional morning headaches. In her early 41s she had migraine headaches. She also has a longer standing history of restless leg symptoms. In the past, she had taken iron for iron deficiency but was never really anemic. Her mother has restless leg symptoms as well. Mother takes a prescription medicine as I understand for restless leg syndrome. She has been told that she moves a lot while asleep. She has woken herself up with her legs moving in from her own snoring. She would be willing to try CPAP therapy. She is supposed to see a cardiologist for  recent issues with chest pain which are deemed musculoskeletal. She has started physical therapy and takes Flexeril at night which has been helpful.   REVIEW OF SYSTEMS: Out of a complete 14 system review of symptoms, the patient complains only of the following symptoms, headaches and all other reviewed systems are negative.  Epworth sleepiness scale: 12 Fatigue severity scale: 26  ALLERGIES: No Known Allergies  HOME MEDICATIONS: Outpatient Medications Prior to Visit  Medication Sig Dispense Refill  . ALPRAZolam (XANAX) 0.5 MG tablet TAKE 1 TABLET(0.5 MG) BY MOUTH DAILY AS NEEDED FOR ANXIETY 30 tablet 0  .  aspirin EC 81 MG tablet Take 81 mg by mouth as needed.     . Aspirin-Acetaminophen-Caffeine (GOODYS EXTRA STRENGTH PO) Take 1 Package by mouth as needed.    Marland Kitchen augmented betamethasone dipropionate (DIPROLENE-AF) 0.05 % cream Apply 1 Dose topically daily. 30 g 2  . escitalopram (LEXAPRO) 20 MG tablet Take 1 tablet (20 mg total) by mouth daily. 90 tablet 3  . famotidine (PEPCID) 40 MG tablet Take 1 tablet (40 mg total) by mouth 2 (two) times daily. 60 tablet 3  . Multiple Vitamins-Minerals (MULTIVITAMIN WITH MINERALS) tablet Take 1 tablet by mouth daily.    Marland Kitchen omeprazole (PRILOSEC) 40 MG capsule Take 1 capsule (40 mg total) by mouth daily. (Patient taking differently: Take 40 mg by mouth daily. "taking PRN") 30 capsule 3  . pantoprazole (PROTONIX) 40 MG tablet TAKE 1 TABLET(40 MG) BY MOUTH DAILY 90 tablet 3   No facility-administered medications prior to visit.     PAST MEDICAL HISTORY: Past Medical History:  Diagnosis Date  . Anxiety   . GERD (gastroesophageal reflux disease)   . High cholesterol   . Psoriasis   . Sterilization consult 01/29/2013   BTL in hosp desired. Papers signed 12/16/12   . SVD (spontaneous vaginal delivery) 02/06/2013    PAST SURGICAL HISTORY: Past Surgical History:  Procedure Laterality Date  . TUBAL LIGATION Bilateral 02/05/2013   Procedure: POST PARTUM TUBAL LIGATION;  Surgeon: Woodroe Mode, MD;  Location: [facility];  Service: Gynecology;  Laterality: Bilateral;    FAMILY HISTORY: Family History  Problem Relation Age of Onset  . Diabetes Maternal Grandmother   . Diabetes Paternal Grandmother   . Anxiety disorder Mother   . Heart attack Brother 34       deceased from this  . Anxiety disorder Brother   . Cirrhosis Maternal Grandfather        alcohol related  . Colon cancer Neg Hx   . Esophageal cancer Neg Hx   . Colon polyps Neg Hx   . Stomach cancer Neg Hx   . Pancreatic cancer Neg Hx     SOCIAL HISTORY: Social History   Socioeconomic History  .  Marital status: Legally Separated    Spouse name: Not on file  . Number of children: Not on file  . Years of education: Not on file  . Highest education level: Not on file  Occupational History  . Not on file  Social Needs  . Financial resource strain: Not on file  . Food insecurity    Worry: Not on file    Inability: Not on file  . Transportation needs    Medical: Not on file    Non-medical: Not on file  Tobacco Use  . Smoking status: Current Some Day Smoker    Packs/day: 0.50    Types: Cigarettes  . Smokeless tobacco: Never Used  Substance and Sexual Activity  .  Alcohol use: No  . Drug use: No  . Sexual activity: Yes    Birth control/protection: Surgical  Lifestyle  . Physical activity    Days per week: Not on file    Minutes per session: Not on file  . Stress: Not on file  Relationships  . Social Herbalist on phone: Not on file    Gets together: Not on file    Attends religious service: Not on file    Active member of club or organization: Not on file    Attends meetings of clubs or organizations: Not on file    Relationship status: Not on file  . Intimate partner violence    Fear of current or ex partner: Not on file    Emotionally abused: Not on file    Physically abused: Not on file    Forced sexual activity: Not on file  Other Topics Concern  . Not on file  Social History Narrative  . Not on file      PHYSICAL EXAM  Vitals:   04/24/19 0852 04/24/19 0857  BP: (!) 150/98 (!) 142/92  Pulse: 88   Temp: 97.9 F (36.6 C)   Weight: 236 lb 9.6 oz (107.3 kg)   Height: 5' 5"  (1.651 m)    Body mass index is 39.37 kg/m.  Generalized: Well developed, in no acute distress  Mallampati: 3+ Neurological examination  Mentation: Alert oriented to time, place, history taking. Follows all commands speech and language fluent Cranial nerve II-XII: Pupils were equal round reactive to light. Extraocular movements were full, visual field were full on  confrontational test. Facial sensation and strength were normal. Uvula tongue midline. Head turning and shoulder shrug  were normal and symmetric. Motor: The motor testing reveals 5 over 5 strength of all 4 extremities. Good symmetric motor tone is noted throughout.  Sensory: Sensory testing is intact to soft touch on all 4 extremities. No evidence of extinction is noted.  Coordination: Cerebellar testing reveals good finger-nose-finger and heel-to-shin bilaterally.  Gait and station: Gait is normal.    DIAGNOSTIC DATA (LABS, IMAGING, TESTING) - I reviewed patient records, labs, notes, testing and imaging myself where available.  No flowsheet data found.   Lab Results  Component Value Date   WBC 14.0 (H) 02/21/2019   HGB 10.6 (L) 02/21/2019   HCT 34.7 (L) 02/21/2019   MCV 78.9 (L) 02/21/2019   PLT 417 (H) 02/21/2019      Component Value Date/Time   NA 137 02/21/2019 0102   NA 140 04/23/2018 1441   K 3.3 (L) 02/21/2019 0102   CL 106 02/21/2019 0102   CO2 23 02/21/2019 0102   GLUCOSE 137 (H) 02/21/2019 0102   BUN 12 02/21/2019 0102   BUN 11 04/23/2018 1441   CREATININE 0.64 02/21/2019 0102   CALCIUM 8.8 (L) 02/21/2019 0102   PROT 6.7 02/21/2019 0102   ALBUMIN 3.8 02/21/2019 0102   AST 20 02/21/2019 0102   ALT 27 02/21/2019 0102   ALKPHOS 91 02/21/2019 0102   BILITOT <0.1 (L) 02/21/2019 0102   GFRNONAA >60 02/21/2019 0102   GFRAA >60 02/21/2019 0102   Lab Results  Component Value Date   CHOL 181 03/22/2018   HDL 34 (L) 03/22/2018   LDLCALC 80 03/22/2018   TRIG 335 (H) 03/22/2018   CHOLHDL 5.3 (H) 03/22/2018   Lab Results  Component Value Date   HGBA1C 5.4 02/19/2018   No results found for: VITAMINB12 No results found for: TSH  ASSESSMENT AND PLAN 38 y.o. year old female  has a past medical history of Anxiety, GERD (gastroesophageal reflux disease), High cholesterol, Psoriasis, Sterilization consult (01/29/2013), and SVD (spontaneous vaginal delivery)  (02/06/2013). here with     ICD-10-CM   1. OSA on CPAP  G47.33 For home use only DME continuous positive airway pressure (CPAP)   Z99.89     Arlina Robes is doing very well on CPAP therapy.  Compliance data shows excellent compliance.  Unfortunately there is a pretty significant leak.  I do feel that this is related to not replacing supplies since March.  I have placed an order to refit her for a mask as she is interested in considering a nasal pillow.  She will follow up with [facility] to ensure that all supplies are replaced and well fitting.  I will repeat download in 6 weeks to ensure leak is resolved.  Assuming leak is resolved, we will plan to follow-up in 1 year.  We will follow-up sooner as indicated.  She verbalizes understanding and agreement with this plan.   Orders Placed This Encounter  Procedures  . For home use only DME continuous positive airway pressure (CPAP)    Needs mask refitting please    Order Specific Question:   Length of Need    Answer:   Lifetime    Order Specific Question:   Patient has OSA or probable OSA    Answer:   Yes    Order Specific Question:   Is the patient currently using CPAP in the home    Answer:   Yes    Order Specific Question:   Settings    Answer:   Other see comments    Order Specific Question:   CPAP supplies needed    Answer:   Mask, headgear, cushions, filters, heated tubing and water chamber     No orders of the defined types were placed in this encounter.     I spent 15 minutes with the patient. 50% of this time was spent counseling and educating patient on plan of care and medications.    Debbora Presto, FNP-C 04/24/2019, 9:19 AM [facility] [address] [place], [place] [address] [phone]  I reviewed the above note and documentation by the Nurse Practitioner and agree with the history, exam, assessment and plan as outlined above. I was available for consultation. Star Age, MD, PhD [facility])

## 2019-05-15 ENCOUNTER — Other Ambulatory Visit: Payer: Self-pay | Admitting: Emergency Medicine

## 2019-05-15 DIAGNOSIS — F418 Other specified anxiety disorders: Secondary | ICD-10-CM

## 2019-05-15 NOTE — Telephone Encounter (Signed)
ALPRAZolam (XANAX) 0.5 MG tablet Needs to be sent to  [place], [place] [facility] [address] [phone] (Phone) [phone] (Fax)   Due to other pharmacy being out of stock/ please advise

## 2019-05-15 NOTE — Telephone Encounter (Signed)
Requested medication (s) are due for refill today: yes  Requested medication (s) are on the active medication list: yes  Last refill:  04/08/2019  Future visit scheduled: yes  Notes to clinic:  Refill cannot be delegated   Requested Prescriptions  Pending Prescriptions Disp Refills   ALPRAZolam (XANAX) 0.5 MG tablet [Pharmacy Med Name: ALPRAZOLAM 0.5MG TABLETS] 30 tablet     Sig: TAKE 1 TABLET(0.5 MG) BY MOUTH DAILY AS NEEDED FOR ANXIETY     Not Delegated - Psychiatry:  Anxiolytics/Hypnotics Failed - 05/15/2019 10:31 AM      Failed - This refill cannot be delegated      Failed - Urine Drug Screen completed in last 360 days.      Passed - Valid encounter within last 6 months    Recent Outpatient Visits          2 months ago Psoriasis   Primary Care at [facility], Ines Bloomer, MD   4 months ago Generalized anxiety disorder   Primary Care at [place], Ines Bloomer, MD   5 months ago Situational anxiety   Primary Care at [place], [place], MD   8 months ago Chest wall pain   Primary Care at [facility], Ines Bloomer, MD   1 year ago Chest wall pain   Primary Care at [facility], Gelene Mink, PA-C      Future Appointments            In 2 weeks Sagardia, Ines Bloomer, MD Primary Care at [place], [facility]   In 3 months Sagardia, Ines Bloomer, MD Primary Care at [place], [facility]

## 2019-05-20 ENCOUNTER — Other Ambulatory Visit: Payer: Self-pay | Admitting: Physician Assistant

## 2019-05-20 ENCOUNTER — Ambulatory Visit
Admission: EM | Admit: 2019-05-20 | Discharge: 2019-05-20 | Disposition: A | Discharge status: Home / Self Care | Payer: BC Managed Care – PPO | Attending: Emergency Medicine | Admitting: Emergency Medicine

## 2019-05-20 ENCOUNTER — Other Ambulatory Visit: Payer: Self-pay

## 2019-05-20 DIAGNOSIS — M62830 Muscle spasm of back: Secondary | ICD-10-CM | POA: Diagnosis not present

## 2019-05-20 DIAGNOSIS — M546 Pain in thoracic spine: Secondary | ICD-10-CM | POA: Diagnosis not present

## 2019-05-20 DIAGNOSIS — K219 Gastro-esophageal reflux disease without esophagitis: Secondary | ICD-10-CM

## 2019-05-20 LAB — POCT URINALYSIS DIP (MANUAL ENTRY)
Bilirubin, UA: NEGATIVE
Blood, UA: NEGATIVE
Glucose, UA: NEGATIVE mg/dL
Ketones, POC UA: NEGATIVE mg/dL
Leukocytes, UA: NEGATIVE
Nitrite, UA: NEGATIVE
Protein Ur, POC: NEGATIVE mg/dL
Spec Grav, UA: 1.02 (ref 1.010–1.025)
Urobilinogen, UA: 0.2 E.U./dL
pH, UA: 6.5 (ref 5.0–8.0)

## 2019-05-20 MED ORDER — CYCLOBENZAPRINE HCL 10 MG PO TABS: 10.0000 mg | ORAL_TABLET | Freq: Every day | ORAL | 0 refills | Status: DC

## 2019-05-20 MED ORDER — PREDNISONE 20 MG PO TABS: 20.0000 mg | ORAL_TABLET | Freq: Two times a day (BID) | ORAL | 0 refills | Status: AC

## 2019-05-20 MED ORDER — KETOROLAC TROMETHAMINE 60 MG/2ML IM SOLN
60.0000 mg | Freq: Once | INTRAMUSCULAR | Status: AC
  Administered 2019-05-20: 60 mg via INTRAMUSCULAR

## 2019-05-20 NOTE — Discharge Instructions (Signed)
Urine did not show signs of infection or blood Continue conservative management of rest, ice, heat, and gentle stretches/ massage Take prednisone Take cyclobenzaprine at nighttime for symptomatic relief. Avoid driving or operating heavy machinery while using medication. Follow up with PCP if symptoms persist Present to ER if worsening or new symptoms (fever, chills, chest pain, abdominal pain, changes in bowel or bladder habits, pain radiating into lower legs, etc...)

## 2019-05-20 NOTE — ED Provider Notes (Signed)
[facility]   CC: Left flank pain and urinary frequency  SUBJECTIVE:  Theresa Sawyer is a 38 y.o. female who complains of left sided flank pain and urinary frequency x 1 day.  Patient denies a precipitating event, recent sexual encounter, excessive caffeine intake.  Denies heavy lifting or strenuous activity.  Localizes the pain to the left flank.  Pain is gradually worsening, constant aching, and jabbing in character.  9/10.  Has tried [facility] powder without relief.  Symptoms are made worse with twisting motion about the spine.  Admits to similar symptoms in the past that resolved without intervention.  Denies fever, chills, nausea, vomiting, abdominal pain, dysuria, urinary urgency, hematuria, constipation, diarrhea, hematochezia, melena, weakness, numbness/ tingling.    LMP: Patient's last menstrual period was 05/08/2019. tubal ligation  ROS: As in HPI.  All other pertinent ROS negative.     Past Medical History:  Diagnosis Date  . Anxiety   . GERD (gastroesophageal reflux disease)   . High cholesterol   . Psoriasis   . Sterilization consult 01/29/2013   BTL in hosp desired. Papers signed 12/16/12   . SVD (spontaneous vaginal delivery) 02/06/2013   Past Surgical History:  Procedure Laterality Date  . TUBAL LIGATION Bilateral 02/05/2013   Procedure: POST PARTUM TUBAL LIGATION;  Surgeon: Woodroe Mode, MD;  Location: [facility];  Service: Gynecology;  Laterality: Bilateral;   No Known Allergies No current facility-administered medications on file prior to encounter.    Current Outpatient Medications on File Prior to Encounter  Medication Sig Dispense Refill  . ALPRAZolam (XANAX) 0.5 MG tablet TAKE 1 TABLET(0.5 MG) BY MOUTH DAILY AS NEEDED FOR ANXIETY 30 tablet 0  . aspirin EC 81 MG tablet Take 81 mg by mouth as needed.     . Aspirin-Acetaminophen-Caffeine (GOODYS EXTRA STRENGTH PO) Take 1 Package by mouth as needed.    Marland Kitchen augmented betamethasone dipropionate (DIPROLENE-AF) 0.05 %  cream Apply 1 Dose topically daily. 30 g 2  . escitalopram (LEXAPRO) 20 MG tablet Take 1 tablet (20 mg total) by mouth daily. 90 tablet 3  . famotidine (PEPCID) 40 MG tablet Take 1 tablet (40 mg total) by mouth 2 (two) times daily. 60 tablet 3  . Multiple Vitamins-Minerals (MULTIVITAMIN WITH MINERALS) tablet Take 1 tablet by mouth daily.    Marland Kitchen omeprazole (PRILOSEC) 40 MG capsule Take 1 capsule (40 mg total) by mouth daily. (Patient taking differently: Take 40 mg by mouth daily. "taking PRN") 30 capsule 3  . pantoprazole (PROTONIX) 40 MG tablet TAKE 1 TABLET(40 MG) BY MOUTH DAILY 90 tablet 3   Social History   Socioeconomic History  . Marital status: Legally Separated    Spouse name: Not on file  . Number of children: Not on file  . Years of education: Not on file  . Highest education level: Not on file  Occupational History  . Not on file  Social Needs  . Financial resource strain: Not on file  . Food insecurity    Worry: Not on file    Inability: Not on file  . Transportation needs    Medical: Not on file    Non-medical: Not on file  Tobacco Use  . Smoking status: Current Some Day Smoker    Packs/day: 0.50    Types: Cigarettes  . Smokeless tobacco: Never Used  Substance and Sexual Activity  . Alcohol use: No  . Drug use: No  . Sexual activity: Yes    Birth control/protection: Surgical  Lifestyle  .  Physical activity    Days per week: Not on file    Minutes per session: Not on file  . Stress: Not on file  Relationships  . Social Herbalist on phone: Not on file    Gets together: Not on file    Attends religious service: Not on file    Active member of club or organization: Not on file    Attends meetings of clubs or organizations: Not on file    Relationship status: Not on file  . Intimate partner violence    Fear of current or ex partner: Not on file    Emotionally abused: Not on file    Physically abused: Not on file    Forced sexual activity: Not on  file  Other Topics Concern  . Not on file  Social History Narrative  . Not on file   Family History  Problem Relation Age of Onset  . Diabetes Maternal Grandmother   . Diabetes Paternal Grandmother   . Anxiety disorder Mother   . Heart attack Brother 34       deceased from this  . Anxiety disorder Brother   . Cirrhosis Maternal Grandfather        alcohol related  . Colon cancer Neg Hx   . Esophageal cancer Neg Hx   . Colon polyps Neg Hx   . Stomach cancer Neg Hx   . Pancreatic cancer Neg Hx     OBJECTIVE:  Vitals:   05/20/19 1921  BP: (!) 160/87  Pulse: 87  Resp: 18  Temp: 99.8 F (37.7 C)  SpO2: 97%   General appearance: Alert; appears uncomfortable but nontoxic HEENT: NCAT.  PERRL, EOMI grossly; Oropharynx clear.  Lungs: clear to auscultation bilaterally without adventitious breath sounds Heart: regular rate and rhythm.  Abdomen: soft; non-distended; no tenderness; bowel sounds present; no guarding; negative murphy; NTTP at McBurney's Back: + LT sided CVA tenderness; TTP over left thoracic paravertebral muscles with possible spasm Extremities: no edema; symmetrical with no gross deformities Skin: warm and dry Neurologic: Ambulates from chair to exam table without difficulty; strength and sensation intact about the upper and lower extremities Psychological: alert and cooperative; normal mood and affect  Labs Reviewed  POCT URINALYSIS DIP (MANUAL ENTRY)   Results for orders placed or performed during the hospital encounter of 05/20/19 (from the past 24 hour(s))  POCT urinalysis dipstick     Status: None   Collection Time: 05/20/19  7:32 PM  Result Value Ref Range   Color, UA yellow yellow   Clarity, UA clear clear   Glucose, UA negative negative mg/dL   Bilirubin, UA negative negative   Ketones, POC UA negative negative mg/dL   Spec Grav, UA 1.020 1.010 - 1.025   Blood, UA negative negative   pH, UA 6.5 5.0 - 8.0   Protein Ur, POC negative negative mg/dL    Urobilinogen, UA 0.2 0.2 or 1.0 E.U./dL   Nitrite, UA Negative Negative   Leukocytes, UA Negative Negative     ASSESSMENT & PLAN:  1. Acute left-sided thoracic back pain   2. Spasm of muscle, back     Meds ordered this encounter  Medications  . ketorolac (TORADOL) injection 60 mg  . predniSONE (DELTASONE) 20 MG tablet    Sig: Take 1 tablet (20 mg total) by mouth 2 (two) times daily with a meal for 5 days.    Dispense:  10 tablet    Refill:  0  Order Specific Question:   Supervising Provider    Answer:   Raylene Everts [[id number]]  . cyclobenzaprine (FLEXERIL) 10 MG tablet    Sig: Take 1 tablet (10 mg total) by mouth at bedtime.    Dispense:  15 tablet    Refill:  0    Order Specific Question:   Supervising Provider    Answer:   Raylene Everts [[id number]]   Toradol shot given in office Urine did not show signs of infection or blood Continue conservative management of rest, ice, heat, and gentle stretches/ massage Take prednisone Take cyclobenzaprine at nighttime for symptomatic relief. Avoid driving or operating heavy machinery while using medication. Follow up with PCP if symptoms persist Present to ER if worsening or new symptoms (fever, chills, chest pain, abdominal pain, changes in bowel or bladder habits, pain radiating into lower legs, etc...)   Outlined signs and symptoms indicating need for more acute intervention. Patient verbalized understanding. After Visit Summary given.     Lestine Box, PA-C 05/20/19 1950

## 2019-05-20 NOTE — ED Triage Notes (Signed)
Pt presents with left sided flank pain that began yesterday , also has urinary frequency

## 2019-05-21 ENCOUNTER — Encounter: Payer: Self-pay | Admitting: *Deleted

## 2019-05-21 ENCOUNTER — Other Ambulatory Visit: Payer: Self-pay

## 2019-05-21 ENCOUNTER — Encounter: Payer: Self-pay | Admitting: Emergency Medicine

## 2019-05-21 ENCOUNTER — Ambulatory Visit: Payer: BC Managed Care – PPO | Admitting: Emergency Medicine

## 2019-05-21 ENCOUNTER — Other Ambulatory Visit: Payer: Self-pay | Admitting: Emergency Medicine

## 2019-05-21 VITALS — BP 142/89 | HR 89 | Temp 99.3°F | Resp 16 | Ht 65.0 in | Wt 240.3 lb

## 2019-05-21 DIAGNOSIS — R10814 Left lower quadrant abdominal tenderness: Secondary | ICD-10-CM | POA: Diagnosis not present

## 2019-05-21 DIAGNOSIS — Z8719 Personal history of other diseases of the digestive system: Secondary | ICD-10-CM

## 2019-05-21 DIAGNOSIS — R109 Unspecified abdominal pain: Secondary | ICD-10-CM | POA: Diagnosis not present

## 2019-05-21 DIAGNOSIS — F418 Other specified anxiety disorders: Secondary | ICD-10-CM

## 2019-05-21 MED ORDER — AMOXICILLIN-POT CLAVULANATE 875-125 MG PO TABS: 1.0000 | ORAL_TABLET | Freq: Two times a day (BID) | ORAL | 0 refills | Status: AC

## 2019-05-21 MED ORDER — TRAMADOL HCL 50 MG PO TABS: 50.0000 mg | ORAL_TABLET | Freq: Three times a day (TID) | ORAL | 0 refills | Status: AC | PRN

## 2019-05-21 MED ORDER — TRAMADOL HCL 50 MG PO TABS: 50.0000 mg | ORAL_TABLET | Freq: Three times a day (TID) | ORAL | 0 refills | Status: DC | PRN

## 2019-05-21 NOTE — Patient Instructions (Addendum)
If you have lab work done today you will be contacted with your lab results within the next 2 weeks.  If you have not heard from [place] then please contact us. The fastest way to get your results is to register for My Chart.   IF you received an x-ray today, you will receive an invoice from [facility]. Please contact [facility] at [phone] with questions or concerns regarding your invoice.   IF you received labwork today, you will receive an invoice from [facility]. Please contact [facility] at [phone] with questions or concerns regarding your invoice.   Our billing staff will not be able to assist you with questions regarding bills from these companies.  You will be contacted with the lab results as soon as they are available. The fastest way to get your results is to activate your My Chart account. Instructions are located on the last page of this paperwork. If you have not heard from [place] regarding the results in 2 weeks, please contact this office.     Diverticulitis  Diverticulitis is when small pockets in your large intestine (colon) get infected or swollen. This causes stomach pain and watery poop (diarrhea). These pouches are called diverticula. They form in people who have a condition called diverticulosis. Follow these instructions at home: Medicines  Take over-the-counter and prescription medicines only as told by your doctor. These include: ? Antibiotics. ? Pain medicines. ? Fiber pills. ? Probiotics. ? Stool softeners.  Do not drive or use heavy machinery while taking prescription pain medicine.  If you were prescribed an antibiotic, take it as told. Do not stop taking it even if you feel better. General instructions   Follow a diet as told by your doctor.  When you feel better, your doctor may tell you to change your diet. You may need to eat a lot of fiber. Fiber makes it easier to poop (have bowel movements). Healthy foods with fiber  include: ? Berries. ? Beans. ? Lentils. ? Green vegetables.  Exercise 3 or more times a week. Aim for 30 minutes each time. Exercise enough to sweat and make your heart beat faster.  Keep all follow-up visits as told. This is important. You may need to have an exam of the large intestine. This is called a colonoscopy. Contact a doctor if:  Your pain does not get better.  You have a hard time eating or drinking.  You are not pooping like normal. Get help right away if:  Your pain gets worse.  Your problems do not get better.  Your problems get worse very fast.  You have a fever.  You throw up (vomit) more than one time.  You have poop that is: ? Bloody. ? Black. ? Tarry. Summary  Diverticulitis is when small pockets in your large intestine (colon) get infected or swollen.  Take medicines only as told by your doctor.  Follow a diet as told by your doctor. This information is not intended to replace advice given to you by your health care provider. Make sure you discuss any questions you have with your health care provider. Document Released: 12/13/2007 Document Revised: 06/08/2017 Document Reviewed: 07/13/2016 Elsevier Patient Education  [place].  Flank Pain, Adult Flank pain is pain in your side. The flank is the area of your side between your upper belly (abdomen) and your back. The pain may occur over a short time (acute), or it may be long-term or come back often (chronic). It  may be mild or very bad. Pain in this area can be caused by many different things. Follow these instructions at home:   Drink enough fluid to keep your pee (urine) clear or pale yellow.  Rest as told by your doctor.  Take over-the-counter and prescription medicines only as told by your doctor.  Keep a journal to keep track of: ? What has caused your flank pain. ? What has made it feel better.  Keep all follow-up visits as told by your doctor. This is important. Contact a  doctor if:  Medicine does not help your pain.  You have new symptoms.  Your pain gets worse.  You have a fever.  Your symptoms last longer than 2-3 days.  You have trouble peeing.  You are peeing more often than normal. Get help right away if:  You have trouble breathing.  You are short of breath.  Your belly hurts, or it is swollen or red.  You feel sick to your stomach (nauseous).  You throw up (vomit).  You feel like you will pass out, or you do pass out (faint).  You have blood in your pee. Summary  Flank pain is pain in your side. The flank is the area of your side between your upper belly (abdomen) and your back.  Flank pain may occur over a short time (acute), or it may be long-term or come back often (chronic). It may be mild or very bad.  Pain in this area can be caused by many different things.  Contact your doctor if your symptoms get worse or they last longer than 2-3 days. This information is not intended to replace advice given to you by your health care provider. Make sure you discuss any questions you have with your health care provider. Document Released: 04/04/2008 Document Revised: 06/08/2017 Document Reviewed: 10/16/2016 Elsevier Patient Education  2020 [employer].

## 2019-05-21 NOTE — Progress Notes (Signed)
Theresa Sawyer 38 y.o.   Chief Complaint  Patient presents with  . Flank Pain    yesterday - LEFT area radiates to the abdominal with slight nausea, no vomiting    HISTORY OF PRESENT ILLNESS: This is a 38 y.o. female complaining of left flank pain that started 2 days ago.  Went to urgent care clinic yesterday, had urinalysis which was negative, given injection of Toradol and sent home with prednisone and Flexeril.  No flulike symptoms and no fever or chills.  No nausea or vomiting.  Able to eat and drink. Has a history of diverticulitis about 7 years ago.  No history of kidney stones. Denies injury.  No other associated symptoms. Pain is sharp, constant, and worse with movement.  Better with certain positions and rest.  Denies skin rash or history of shingles.  At times pain feels in the front, left lower abdomen.  HPI   Prior to Admission medications   Medication Sig Start Date End Date Taking? Authorizing Provider  ALPRAZolam Duanne Moron) 0.5 MG tablet TAKE 1 TABLET(0.5 MG) BY MOUTH DAILY AS NEEDED FOR ANXIETY 05/20/19  Yes Evianna Chandran, Ines Bloomer, MD  aspirin EC 81 MG tablet Take 81 mg by mouth as needed.    Yes [provider]  Aspirin-Acetaminophen-Caffeine (GOODYS EXTRA STRENGTH PO) Take 1 Package by mouth as needed.   Yes [provider]  cyclobenzaprine (FLEXERIL) 10 MG tablet Take 1 tablet (10 mg total) by mouth at bedtime. 05/20/19  Yes Wurst, [place], PA-C  escitalopram (LEXAPRO) 20 MG tablet Take 1 tablet (20 mg total) by mouth daily. 03/13/19 06/11/19 Yes Shelvie Salsberry, Ines Bloomer, MD  famotidine (PEPCID) 40 MG tablet Take 1 tablet (40 mg total) by mouth 2 (two) times daily. 03/03/19  Yes Thornton Park, MD  Multiple Vitamins-Minerals (MULTIVITAMIN WITH MINERALS) tablet Take 1 tablet by mouth daily.   Yes [provider]  omeprazole (PRILOSEC) 40 MG capsule Take 1 capsule (40 mg total) by mouth daily. Patient taking differently: Take 40 mg by mouth daily.  "taking PRN" 03/03/19  Yes Thornton Park, MD  predniSONE (DELTASONE) 20 MG tablet Take 1 tablet (20 mg total) by mouth 2 (two) times daily with a meal for 5 days. 05/20/19 05/25/19 Yes Wurst, [place], PA-C  amoxicillin-clavulanate (AUGMENTIN) 875-125 MG tablet Take 1 tablet by mouth 2 (two) times daily for 7 days. 05/21/19 05/28/19  Horald Pollen, MD  augmented betamethasone dipropionate (DIPROLENE-AF) 0.05 % cream Apply 1 Dose topically daily. Patient not taking: Reported on 05/21/2019 03/13/19   Horald Pollen, MD  pantoprazole (PROTONIX) 40 MG tablet TAKE 1 TABLET(40 MG) BY MOUTH DAILY Patient not taking: Reported on 05/21/2019 05/20/19   McVey, Gelene Mink, PA-C  traMADol (ULTRAM) 50 MG tablet Take 1 tablet (50 mg total) by mouth every 8 (eight) hours as needed for up to 5 days. 05/21/19 05/26/19  Horald Pollen, MD    No Known Allergies  Patient Active Problem List   Diagnosis Date Noted  . OSA on CPAP 04/24/2019  . Psoriasis 12/05/2018  . Situational anxiety 12/05/2018  . Intermittent hypertension 12/05/2018    Past Medical History:  Diagnosis Date  . Anxiety   . GERD (gastroesophageal reflux disease)   . High cholesterol   . Psoriasis   . Sterilization consult 01/29/2013   BTL in hosp desired. Papers signed 12/16/12   . SVD (spontaneous vaginal delivery) 02/06/2013    Past Surgical History:  Procedure Laterality Date  . TUBAL LIGATION Bilateral 02/05/2013  Procedure: POST PARTUM TUBAL LIGATION;  Surgeon: Woodroe Mode, MD;  Location: [facility];  Service: Gynecology;  Laterality: Bilateral;    Social History   Socioeconomic History  . Marital status: Legally Separated    Spouse name: Not on file  . Number of children: Not on file  . Years of education: Not on file  . Highest education level: Not on file  Occupational History  . Not on file  Social Needs  . Financial resource strain: Not on file  . Food insecurity    Worry: Not on file     Inability: Not on file  . Transportation needs    Medical: Not on file    Non-medical: Not on file  Tobacco Use  . Smoking status: Current Some Day Smoker    Packs/day: 0.50    Types: Cigarettes  . Smokeless tobacco: Never Used  Substance and Sexual Activity  . Alcohol use: No  . Drug use: No  . Sexual activity: Yes    Birth control/protection: Surgical  Lifestyle  . Physical activity    Days per week: Not on file    Minutes per session: Not on file  . Stress: Not on file  Relationships  . Social Herbalist on phone: Not on file    Gets together: Not on file    Attends religious service: Not on file    Active member of club or organization: Not on file    Attends meetings of clubs or organizations: Not on file    Relationship status: Not on file  . Intimate partner violence    Fear of current or ex partner: Not on file    Emotionally abused: Not on file    Physically abused: Not on file    Forced sexual activity: Not on file  Other Topics Concern  . Not on file  Social History Narrative  . Not on file    Family History  Problem Relation Age of Onset  . Diabetes Maternal Grandmother   . Diabetes Paternal Grandmother   . Anxiety disorder Mother   . Heart attack Brother 34       deceased from this  . Anxiety disorder Brother   . Cirrhosis Maternal Grandfather        alcohol related  . Colon cancer Neg Hx   . Esophageal cancer Neg Hx   . Colon polyps Neg Hx   . Stomach cancer Neg Hx   . Pancreatic cancer Neg Hx      Review of Systems  Constitutional: Negative.  Negative for chills and fever.  HENT: Negative.  Negative for congestion and sore throat.   Respiratory: Negative.  Negative for cough and shortness of breath.   Cardiovascular: Negative for chest pain and palpitations.  Gastrointestinal: Positive for abdominal pain. Negative for blood in stool, diarrhea, melena, nausea and vomiting.  Genitourinary: Positive for flank pain. Negative for  dysuria.  Musculoskeletal: Positive for back pain.  Skin: Negative.  Negative for rash.  Neurological: Negative for dizziness and headaches.  All other systems reviewed and are negative.  Today's Vitals   05/21/19 1651  BP: (!) 142/89  Pulse: 89  Resp: 16  Temp: 99.3 F (37.4 C)  TempSrc: Oral  SpO2: 97%  Weight: 240 lb 4.8 oz (109 kg)  Height: 5' 5"  (1.651 m)   Body mass index is 39.99 kg/m.   Physical Exam Vitals signs reviewed.  Constitutional:      Appearance: Normal appearance.  HENT:     Head: Normocephalic.  Eyes:     Extraocular Movements: Extraocular movements intact.     Pupils: Pupils are equal, round, and reactive to light.  Neck:     Musculoskeletal: Normal range of motion and neck supple.  Cardiovascular:     Rate and Rhythm: Normal rate and regular rhythm.     Pulses: Normal pulses.     Heart sounds: Normal heart sounds.  Pulmonary:     Effort: Pulmonary effort is normal.     Breath sounds: Normal breath sounds.  Abdominal:     General: Bowel sounds are normal.     Palpations: Abdomen is soft.     Tenderness: There is abdominal tenderness in the left lower quadrant. There is guarding. There is no right CVA tenderness, left CVA tenderness or rebound.  Musculoskeletal:       Arms:  Skin:    General: Skin is warm and dry.     Capillary Refill: Capillary refill takes less than 2 seconds.  Neurological:     General: No focal deficit present.     Mental Status: She is alert and oriented to person, place, and time.  Psychiatric:        Mood and Affect: Mood normal.        Behavior: Behavior normal.    A total of 25 minutes was spent in the room with the patient, greater than 50% of which was in counseling/coordination of care regarding differential diagnosis including possible diverticulitis, treatment, diagnostic approach, need for blood work, diet modification for the next 2 to 3 days, medications and side effects, pain management, ED precautions,  prognosis and need for follow-up if no better or worse.   ASSESSMENT & PLAN: Mehgan was seen today for flank pain.  Diagnoses and all orders for this visit:  Left flank pain -     CBC with Differential -     Comprehensive metabolic panel -     traMADol (ULTRAM) 50 MG tablet; Take 1 tablet (50 mg total) by mouth every 8 (eight) hours as needed for up to 5 days.  History of diverticulitis -     amoxicillin-clavulanate (AUGMENTIN) 875-125 MG tablet; Take 1 tablet by mouth 2 (two) times daily for 7 days.  Left lower quadrant abdominal tenderness without rebound tenderness -     amoxicillin-clavulanate (AUGMENTIN) 875-125 MG tablet; Take 1 tablet by mouth 2 (two) times daily for 7 days.    Patient Instructions       If you have lab work done today you will be contacted with your lab results within the next 2 weeks.  If you have not heard from [place] then please contact us. The fastest way to get your results is to register for My Chart.   IF you received an x-ray today, you will receive an invoice from [facility]. Please contact [facility] at [phone] with questions or concerns regarding your invoice.   IF you received labwork today, you will receive an invoice from [place]. Please contact [facility] at [phone] with questions or concerns regarding your invoice.   Our billing staff will not be able to assist you with questions regarding bills from these companies.  You will be contacted with the lab results as soon as they are available. The fastest way to get your results is to activate your My Chart account. Instructions are located on the last page of this paperwork. If you have not heard from [place] regarding the results in 2  weeks, please contact this office.     Diverticulitis  Diverticulitis is when small pockets in your large intestine (colon) get infected or swollen. This causes stomach pain and watery poop (diarrhea). These pouches are called  diverticula. They form in people who have a condition called diverticulosis. Follow these instructions at home: Medicines  Take over-the-counter and prescription medicines only as told by your doctor. These include: ? Antibiotics. ? Pain medicines. ? Fiber pills. ? Probiotics. ? Stool softeners.  Do not drive or use heavy machinery while taking prescription pain medicine.  If you were prescribed an antibiotic, take it as told. Do not stop taking it even if you feel better. General instructions   Follow a diet as told by your doctor.  When you feel better, your doctor may tell you to change your diet. You may need to eat a lot of fiber. Fiber makes it easier to poop (have bowel movements). Healthy foods with fiber include: ? Berries. ? Beans. ? Lentils. ? Green vegetables.  Exercise 3 or more times a week. Aim for 30 minutes each time. Exercise enough to sweat and make your heart beat faster.  Keep all follow-up visits as told. This is important. You may need to have an exam of the large intestine. This is called a colonoscopy. Contact a doctor if:  Your pain does not get better.  You have a hard time eating or drinking.  You are not pooping like normal. Get help right away if:  Your pain gets worse.  Your problems do not get better.  Your problems get worse very fast.  You have a fever.  You throw up (vomit) more than one time.  You have poop that is: ? Bloody. ? Black. ? Tarry. Summary  Diverticulitis is when small pockets in your large intestine (colon) get infected or swollen.  Take medicines only as told by your doctor.  Follow a diet as told by your doctor. This information is not intended to replace advice given to you by your health care provider. Make sure you discuss any questions you have with your health care provider. Document Released: 12/13/2007 Document Revised: 06/08/2017 Document Reviewed: 07/13/2016 Elsevier Patient Education  [place].  Flank Pain, Adult Flank pain is pain in your side. The flank is the area of your side between your upper belly (abdomen) and your back. The pain may occur over a short time (acute), or it may be long-term or come back often (chronic). It may be mild or very bad. Pain in this area can be caused by many different things. Follow these instructions at home:   Drink enough fluid to keep your pee (urine) clear or pale yellow.  Rest as told by your doctor.  Take over-the-counter and prescription medicines only as told by your doctor.  Keep a journal to keep track of: ? What has caused your flank pain. ? What has made it feel better.  Keep all follow-up visits as told by your doctor. This is important. Contact a doctor if:  Medicine does not help your pain.  You have new symptoms.  Your pain gets worse.  You have a fever.  Your symptoms last longer than 2-3 days.  You have trouble peeing.  You are peeing more often than normal. Get help right away if:  You have trouble breathing.  You are short of breath.  Your belly hurts, or it is swollen or red.  You feel sick to your stomach (nauseous).  You throw up (vomit).  You feel like you will pass out, or you do pass out (faint).  You have blood in your pee. Summary  Flank pain is pain in your side. The flank is the area of your side between your upper belly (abdomen) and your back.  Flank pain may occur over a short time (acute), or it may be long-term or come back often (chronic). It may be mild or very bad.  Pain in this area can be caused by many different things.  Contact your doctor if your symptoms get worse or they last longer than 2-3 days. This information is not intended to replace advice given to you by your health care provider. Make sure you discuss any questions you have with your health care provider. Document Released: 04/04/2008 Document Revised: 06/08/2017 Document Reviewed: 10/16/2016  Elsevier Patient Education  2020 Elsevier Inc.      Agustina Caroli, MD [facility]

## 2019-05-22 DIAGNOSIS — G4733 Obstructive sleep apnea (adult) (pediatric): Secondary | ICD-10-CM | POA: Diagnosis not present

## 2019-05-22 LAB — CBC WITH DIFFERENTIAL/PLATELET
Basophils Absolute: 0.2 10*3/uL (ref 0.0–0.2)
Basos: 1 %
EOS (ABSOLUTE): 0.1 10*3/uL (ref 0.0–0.4)
Eos: 1 %
Hematocrit: 34.2 % (ref 34.0–46.6)
Hemoglobin: 10.4 g/dL — ABNORMAL LOW (ref 11.1–15.9)
Immature Grans (Abs): 0.3 10*3/uL — ABNORMAL HIGH (ref 0.0–0.1)
Immature Granulocytes: 2 %
Lymphocytes Absolute: 2.2 10*3/uL (ref 0.7–3.1)
Lymphs: 12 %
MCH: 22.2 pg — ABNORMAL LOW (ref 26.6–33.0)
MCHC: 30.4 g/dL — ABNORMAL LOW (ref 31.5–35.7)
MCV: 73 fL — ABNORMAL LOW (ref 79–97)
Monocytes Absolute: 0.9 10*3/uL (ref 0.1–0.9)
Monocytes: 5 %
Neutrophils Absolute: 14.3 10*3/uL — ABNORMAL HIGH (ref 1.4–7.0)
Neutrophils: 79 %
Platelets: 430 10*3/uL (ref 150–450)
RBC: 4.68 x10E6/uL (ref 3.77–5.28)
RDW: 16.4 % — ABNORMAL HIGH (ref 11.7–15.4)
WBC: 17.9 10*3/uL — ABNORMAL HIGH (ref 3.4–10.8)

## 2019-05-22 LAB — COMPREHENSIVE METABOLIC PANEL
ALT: 23 IU/L (ref 0–32)
AST: 19 IU/L (ref 0–40)
Albumin/Globulin Ratio: 1.4 (ref 1.2–2.2)
Albumin: 4 g/dL (ref 3.8–4.8)
Alkaline Phosphatase: 119 IU/L — ABNORMAL HIGH (ref 39–117)
BUN/Creatinine Ratio: 8 — ABNORMAL LOW (ref 9–23)
BUN: 6 mg/dL (ref 6–20)
Bilirubin Total: 0.2 mg/dL (ref 0.0–1.2)
CO2: 17 mmol/L — ABNORMAL LOW (ref 20–29)
Calcium: 9.3 mg/dL (ref 8.7–10.2)
Chloride: 105 mmol/L (ref 96–106)
Creatinine, Ser: 0.76 mg/dL (ref 0.57–1.00)
GFR calc Af Amer: 114 mL/min/{1.73_m2} (ref >59)
GFR calc non Af Amer: 99 mL/min/{1.73_m2} (ref >59)
Globulin, Total: 2.9 g/dL (ref 1.5–4.5)
Glucose: 136 mg/dL — ABNORMAL HIGH (ref 65–99)
Potassium: 4.5 mmol/L (ref 3.5–5.2)
Sodium: 136 mmol/L (ref 134–144)
Total Protein: 6.9 g/dL (ref 6.0–8.5)

## 2019-05-26 ENCOUNTER — Telehealth: Payer: Self-pay | Admitting: Emergency Medicine

## 2019-05-26 NOTE — Telephone Encounter (Signed)
Copied from [place] [id number]. Topic: General - Other >> May 26, 2019  4:36 PM Theresa Sawyer wrote: Reason for CRM: Pt stated she was returning office call and it may have been about her lab work/ please advise

## 2019-05-27 NOTE — Addendum Note (Signed)
Addended by: Alfredia Ferguson A on: 05/27/2019 05:28 PM   Modules accepted: Orders

## 2019-05-28 ENCOUNTER — Other Ambulatory Visit: Payer: Self-pay

## 2019-05-28 ENCOUNTER — Other Ambulatory Visit: Payer: Self-pay | Admitting: *Deleted

## 2019-05-28 DIAGNOSIS — F418 Other specified anxiety disorders: Secondary | ICD-10-CM

## 2019-05-28 MED ORDER — ALPRAZOLAM 0.5 MG PO TABS: ORAL_TABLET | ORAL | 0 refills | Status: DC

## 2019-05-29 ENCOUNTER — Encounter: Payer: Self-pay | Admitting: Radiology

## 2019-05-29 NOTE — Telephone Encounter (Signed)
Informed pt of labs, she verbalized understanding.

## 2019-06-04 ENCOUNTER — Ambulatory Visit: Payer: BLUE CROSS/BLUE SHIELD | Admitting: Emergency Medicine

## 2019-06-20 ENCOUNTER — Other Ambulatory Visit: Payer: Self-pay

## 2019-06-20 ENCOUNTER — Ambulatory Visit
Admission: EM | Admit: 2019-06-20 | Discharge: 2019-06-20 | Disposition: A | Discharge status: Home / Self Care | Payer: BC Managed Care – PPO | Attending: Emergency Medicine | Admitting: Emergency Medicine

## 2019-06-20 DIAGNOSIS — M25572 Pain in left ankle and joints of left foot: Secondary | ICD-10-CM

## 2019-06-20 MED ORDER — MELOXICAM 7.5 MG PO TABS: 7.5000 mg | ORAL_TABLET | Freq: Every day | ORAL | 0 refills | Status: DC

## 2019-06-20 NOTE — ED Triage Notes (Signed)
Pt presents to UC w/ c/o left ankle pain x2 days. Pt states it is more swollen than normal. Pt states she doesn't recall injuring that foot or ankle.

## 2019-06-20 NOTE — ED Provider Notes (Signed)
[facility]   [id number] 06/20/19 Arrival Time: 2505  CC: Left ankle pain  SUBJECTIVE: History from: patient. Theresa Sawyer is a 38 y.o. female complains of left ankle pain that began 2 days.  Denies a precipitating event or specific injury.  Localizes the pain to the inside of left ankle.  Describes the pain as constant and sharp in character.  Has tried OTC medications without relief.  Symptoms are made worse with weight-bearing and walking.  Denies similar symptoms in the past. Complains of associated swelling.  Denies fever, chills, erythema, ecchymosis, weakness, numbness and tingling.  ROS: As per HPI.  All other pertinent ROS negative.     Past Medical History:  Diagnosis Date  . Anxiety   . GERD (gastroesophageal reflux disease)   . High cholesterol   . Psoriasis   . Sterilization consult 01/29/2013   BTL in hosp desired. Papers signed 12/16/12   . SVD (spontaneous vaginal delivery) 02/06/2013   Past Surgical History:  Procedure Laterality Date  . TUBAL LIGATION Bilateral 02/05/2013   Procedure: POST PARTUM TUBAL LIGATION;  Surgeon: Woodroe Mode, MD;  Location: [facility];  Service: Gynecology;  Laterality: Bilateral;   No Known Allergies No current facility-administered medications on file prior to encounter.   Current Outpatient Medications on File Prior to Encounter  Medication Sig Dispense Refill  . ALPRAZolam (XANAX) 0.5 MG tablet TAKE 1 TABLET(0.5 MG) BY MOUTH DAILY AS NEEDED FOR ANXIETY 30 tablet 0  . aspirin EC 81 MG tablet Take 81 mg by mouth as needed.     . Aspirin-Acetaminophen-Caffeine (GOODYS EXTRA STRENGTH PO) Take 1 Package by mouth as needed.    Marland Kitchen augmented betamethasone dipropionate (DIPROLENE-AF) 0.05 % cream Apply 1 Dose topically daily. (Patient not taking: Reported on 05/21/2019) 30 g 2  . cyclobenzaprine (FLEXERIL) 10 MG tablet Take 1 tablet (10 mg total) by mouth at bedtime. 15 tablet 0  . escitalopram (LEXAPRO) 20 MG tablet Take 1 tablet (20  mg total) by mouth daily. 90 tablet 3  . famotidine (PEPCID) 40 MG tablet Take 1 tablet (40 mg total) by mouth 2 (two) times daily. 60 tablet 3  . Multiple Vitamins-Minerals (MULTIVITAMIN WITH MINERALS) tablet Take 1 tablet by mouth daily.    Marland Kitchen omeprazole (PRILOSEC) 40 MG capsule Take 1 capsule (40 mg total) by mouth daily. (Patient taking differently: Take 40 mg by mouth daily. "taking PRN") 30 capsule 3  . pantoprazole (PROTONIX) 40 MG tablet TAKE 1 TABLET(40 MG) BY MOUTH DAILY (Patient not taking: Reported on 05/21/2019) 90 tablet 3   Social History   Socioeconomic History  . Marital status: Legally Separated    Spouse name: Not on file  . Number of children: Not on file  . Years of education: Not on file  . Highest education level: Not on file  Occupational History  . Not on file  Tobacco Use  . Smoking status: Current Some Day Smoker    Packs/day: 0.50    Types: Cigarettes  . Smokeless tobacco: Never Used  Substance and Sexual Activity  . Alcohol use: No  . Drug use: No  . Sexual activity: Yes    Birth control/protection: Surgical  Other Topics Concern  . Not on file  Social History Narrative  . Not on file   Social Determinants of Health   Financial Resource Strain:   . Difficulty of Paying Living Expenses: Not on file  Food Insecurity:   . Worried About Charity fundraiser in  the Last Year: Not on file  . Ran Out of Food in the Last Year: Not on file  Transportation Needs:   . Lack of Transportation (Medical): Not on file  . Lack of Transportation (Non-Medical): Not on file  Physical Activity:   . Days of Exercise per Week: Not on file  . Minutes of Exercise per Session: Not on file  Stress:   . Feeling of Stress : Not on file  Social Connections:   . Frequency of Communication with Friends and Family: Not on file  . Frequency of Social Gatherings with Friends and Family: Not on file  . Attends Religious Services: Not on file  . Active Member of Clubs or  Organizations: Not on file  . Attends Archivist Meetings: Not on file  . Marital Status: Not on file  Intimate Partner Violence:   . Fear of Current or Ex-Partner: Not on file  . Emotionally Abused: Not on file  . Physically Abused: Not on file  . Sexually Abused: Not on file   Family History  Problem Relation Age of Onset  . Diabetes Maternal Grandmother   . Diabetes Paternal Grandmother   . Anxiety disorder Mother   . Heart attack Brother 34       deceased from this  . Anxiety disorder Brother   . Cirrhosis Maternal Grandfather        alcohol related  . Colon cancer Neg Hx   . Esophageal cancer Neg Hx   . Colon polyps Neg Hx   . Stomach cancer Neg Hx   . Pancreatic cancer Neg Hx     OBJECTIVE:  Vitals:   06/20/19 1608  BP: (!) 149/91  Pulse: 89  Resp: 16  Temp: 98.2 F (36.8 C)  TempSrc: Oral  SpO2: 97%    General appearance: ALERT; in no acute distress.  Head: NCAT Lungs: Normal respiratory effort CV: Dorsalis pedis pulses 2+ bilaterally. Cap refill < 2 seconds Musculoskeletal: Left ankle Inspection: Mild swelling over medial ankle Palpation: TTP over medial malleolus, deltoid ligament ROM: FROM active and passive Strength: 5/5 dorsiflexion, 5/5 plantar flexion Skin: warm and dry Neurologic: Ambulates with minimal difficulty; Sensation intact about the lower extremities Psychological: alert and cooperative; normal mood and affect   ASSESSMENT & PLAN:  1. Acute left ankle pain     Meds ordered this encounter  Medications  . meloxicam (MOBIC) 7.5 MG tablet    Sig: Take 1 tablet (7.5 mg total) by mouth daily.    Dispense:  30 tablet    Refill:  0    Order Specific Question:   Supervising Provider    Answer:   Raylene Everts [[id number]]   Declines x-ray at this time Continue conservative management of rest, ice, and elevation Take mobic as needed for pain relief (may cause abdominal discomfort, ulcers, and GI bleeds avoid taking with  other NSAIDs) Ankle brace given.   Follow up with PCP if symptoms persist Return or go to the ER if you have any new or worsening symptoms (fever, chills, increased swelling, redness, pain, worsening symptoms despite medication and treatment, etc...)   Reviewed expectations re: course of current medical issues. Questions answered. Outlined signs and symptoms indicating need for more acute intervention. Patient verbalized understanding. After Visit Summary given.    Lestine Box, PA-C 06/20/19 1636

## 2019-06-20 NOTE — Discharge Instructions (Signed)
Declines x-ray at this time Continue conservative management of rest, ice, and elevation Take mobic as needed for pain relief (may cause abdominal discomfort, ulcers, and GI bleeds avoid taking with other NSAIDs) Ankle brace given.   Follow up with PCP if symptoms persist Return or go to the ER if you have any new or worsening symptoms (fever, chills, increased swelling, redness, pain, worsening symptoms despite medication and treatment, etc...)

## 2019-07-10 ENCOUNTER — Ambulatory Visit
Admission: EM | Admit: 2019-07-10 | Discharge: 2019-07-10 | Disposition: A | Discharge status: Home / Self Care | Payer: BC Managed Care – PPO | Attending: Emergency Medicine | Admitting: Emergency Medicine

## 2019-07-10 ENCOUNTER — Other Ambulatory Visit: Payer: Self-pay

## 2019-07-10 DIAGNOSIS — Z20828 Contact with and (suspected) exposure to other viral communicable diseases: Secondary | ICD-10-CM

## 2019-07-10 DIAGNOSIS — Z20822 Contact with and (suspected) exposure to covid-19: Secondary | ICD-10-CM

## 2019-07-10 LAB — POCT RAPID STREP A (OFFICE): Rapid Strep A Screen: NEGATIVE

## 2019-07-10 MED ORDER — CETIRIZINE HCL 10 MG PO TABS: 10.0000 mg | ORAL_TABLET | Freq: Every day | ORAL | 0 refills | Status: DC

## 2019-07-10 MED ORDER — AMOXICILLIN-POT CLAVULANATE 875-125 MG PO TABS: 1.0000 | ORAL_TABLET | Freq: Two times a day (BID) | ORAL | 0 refills | Status: AC

## 2019-07-10 MED ORDER — BENZONATATE 100 MG PO CAPS: 100.0000 mg | ORAL_CAPSULE | Freq: Three times a day (TID) | ORAL | 0 refills | Status: DC

## 2019-07-10 MED ORDER — FLUTICASONE PROPIONATE 50 MCG/ACT NA SUSP: 2.0000 | Freq: Every day | NASAL | 0 refills | Status: DC

## 2019-07-10 NOTE — Discharge Instructions (Addendum)
Strep negative. Culture sent.   COVID testing ordered.  It will take between 5-7 days for test results.  Someone will contact you regarding abnormal results.    In the meantime: You should remain isolated in your home for 10 days from symptom onset AND greater than 72 hours after symptoms resolution (absence of fever without the use of fever-reducing medication and improvement in respiratory symptoms), whichever is longer Get plenty of rest and push fluids Use OTC zyrtec for nasal congestion, runny nose, and/or sore throat Use OTC flonase for nasal congestion and runny nose Use medications daily for symptom relief Use OTC medications like ibuprofen or tylenol as needed fever or pain Call or go to the ED if you have any new or worsening symptoms such as fever, worsening cough, shortness of breath, chest tightness, chest pain, turning blue, changes in mental status, etc..Marland Kitchen

## 2019-07-10 NOTE — ED Provider Notes (Signed)
[place]   [id number] 07/10/19 Arrival Time: 7628   CC: COVID symptoms  SUBJECTIVE: History from: patient.  Theresa Sawyer is a 38 y.o. female who presents with sinus congestion, and mild cough x 3 days, sore throat x 1 day.  Denies sick exposure to COVID, flu or strep.  Denies recent travel.  Has tried OTC medications with relief.  Worsening symptoms with swallowing, but tolerating secretions and liquids.  Reports previous symptoms in the past with strep throat.   Denies fever, SOB, wheezing, chest pain, nausea, vomiting, changes in bowel or bladder habits.    ROS: As per HPI.  All other pertinent ROS negative.     Past Medical History:  Diagnosis Date  . Anxiety   . GERD (gastroesophageal reflux disease)   . High cholesterol   . Psoriasis   . Sterilization consult 01/29/2013   BTL in hosp desired. Papers signed 12/16/12   . SVD (spontaneous vaginal delivery) 02/06/2013   Past Surgical History:  Procedure Laterality Date  . TUBAL LIGATION Bilateral 02/05/2013   Procedure: POST PARTUM TUBAL LIGATION;  Surgeon: Woodroe Mode, MD;  Location: [facility];  Service: Gynecology;  Laterality: Bilateral;   No Known Allergies No current facility-administered medications on file prior to encounter.   Current Outpatient Medications on File Prior to Encounter  Medication Sig Dispense Refill  . ALPRAZolam (XANAX) 0.5 MG tablet TAKE 1 TABLET(0.5 MG) BY MOUTH DAILY AS NEEDED FOR ANXIETY 30 tablet 0  . aspirin EC 81 MG tablet Take 81 mg by mouth as needed.     . Aspirin-Acetaminophen-Caffeine (GOODYS EXTRA STRENGTH PO) Take 1 Package by mouth as needed.    Marland Kitchen escitalopram (LEXAPRO) 20 MG tablet Take 1 tablet (20 mg total) by mouth daily. 90 tablet 3  . famotidine (PEPCID) 40 MG tablet Take 1 tablet (40 mg total) by mouth 2 (two) times daily. 60 tablet 3  . meloxicam (MOBIC) 7.5 MG tablet Take 1 tablet (7.5 mg total) by mouth daily. 30 tablet 0  . Multiple Vitamins-Minerals (MULTIVITAMIN  WITH MINERALS) tablet Take 1 tablet by mouth daily.    Marland Kitchen omeprazole (PRILOSEC) 40 MG capsule Take 1 capsule (40 mg total) by mouth daily. (Patient taking differently: Take 40 mg by mouth daily. "taking PRN") 30 capsule 3  . pantoprazole (PROTONIX) 40 MG tablet TAKE 1 TABLET(40 MG) BY MOUTH DAILY (Patient not taking: Reported on 05/21/2019) 90 tablet 3   Social History   Socioeconomic History  . Marital status: Legally Separated    Spouse name: Not on file  . Number of children: Not on file  . Years of education: Not on file  . Highest education level: Not on file  Occupational History  . Not on file  Tobacco Use  . Smoking status: Current Some Day Smoker    Packs/day: 0.50    Types: Cigarettes  . Smokeless tobacco: Never Used  Substance and Sexual Activity  . Alcohol use: No  . Drug use: No  . Sexual activity: Yes    Birth control/protection: Surgical  Other Topics Concern  . Not on file  Social History Narrative  . Not on file   Social Determinants of Health   Financial Resource Strain:   . Difficulty of Paying Living Expenses: Not on file  Food Insecurity:   . Worried About Charity fundraiser in the Last Year: Not on file  . Ran Out of Food in the Last Year: Not on file  Transportation Needs:   .  Lack of Transportation (Medical): Not on file  . Lack of Transportation (Non-Medical): Not on file  Physical Activity:   . Days of Exercise per Week: Not on file  . Minutes of Exercise per Session: Not on file  Stress:   . Feeling of Stress : Not on file  Social Connections:   . Frequency of Communication with Friends and Family: Not on file  . Frequency of Social Gatherings with Friends and Family: Not on file  . Attends Religious Services: Not on file  . Active Member of Clubs or Organizations: Not on file  . Attends Archivist Meetings: Not on file  . Marital Status: Not on file  Intimate Partner Violence:   . Fear of Current or Ex-Partner: Not on file  .  Emotionally Abused: Not on file  . Physically Abused: Not on file  . Sexually Abused: Not on file   Family History  Problem Relation Age of Onset  . Diabetes Maternal Grandmother   . Diabetes Paternal Grandmother   . Anxiety disorder Mother   . Heart attack Brother 34       deceased from this  . Anxiety disorder Brother   . Cirrhosis Maternal Grandfather        alcohol related  . Colon cancer Neg Hx   . Esophageal cancer Neg Hx   . Colon polyps Neg Hx   . Stomach cancer Neg Hx   . Pancreatic cancer Neg Hx     OBJECTIVE:  Vitals:   07/10/19 1737  BP: (!) 155/94  Pulse: 84  Resp: 18  Temp: 98.3 F (36.8 C)  SpO2: 98%     General appearance: alert; appears fatigued, but nontoxic; speaking in full sentences and tolerating own secretions HEENT: NCAT; Ears: EACs clear, TMs pearly gray; Eyes: PERRL.  EOM grossly intact; Nose: nares patent without rhinorrhea, Throat: oropharynx clear, tonsils erythematous or enlarged, uvula midline  Neck: supple without LAD Lungs: unlabored respirations, symmetrical air entry; cough: absent; no respiratory distress; CTAB Heart: regular rate and rhythm.  Skin: warm and dry Psychological: alert and cooperative; normal mood and affect  LABS:  Results for orders placed or performed during the hospital encounter of 07/10/19 (from the past 24 hour(s))  POCT rapid strep A     Status: None   Collection Time: 07/10/19  5:44 PM  Result Value Ref Range   Rapid Strep A Screen Negative Negative    ASSESSMENT & PLAN:  1. Suspected COVID-19 virus infection     Meds ordered this encounter  Medications  . cetirizine (ZYRTEC) 10 MG tablet    Sig: Take 1 tablet (10 mg total) by mouth daily.    Dispense:  30 tablet    Refill:  0    Order Specific Question:   Supervising Provider    Answer:   Raylene Everts [[id number]]  . fluticasone (FLONASE) 50 MCG/ACT nasal spray    Sig: Place 2 sprays into both nostrils daily.    Dispense:  16 g    Refill:   0    Order Specific Question:   Supervising Provider    Answer:   Raylene Everts [[id number]]  . benzonatate (TESSALON) 100 MG capsule    Sig: Take 1 capsule (100 mg total) by mouth every 8 (eight) hours.    Dispense:  21 capsule    Refill:  0    Order Specific Question:   Supervising Provider    Answer:   Raylene Everts [[id number]]  .  amoxicillin-clavulanate (AUGMENTIN) 875-125 MG tablet    Sig: Take 1 tablet by mouth every 12 (twelve) hours for 10 days.    Dispense:  20 tablet    Refill:  0    Order Specific Question:   Supervising Provider    Answer:   Raylene Everts [[id number]]   Strep negative. Culture sent.   Based on exam with treat for possible strep Augmentin sent in COVID testing ordered.  It will take between 5-7 days for test results.  Someone will contact you regarding abnormal results.    In the meantime: You should remain isolated in your home for 10 days from symptom onset AND greater than 72 hours after symptoms resolution (absence of fever without the use of fever-reducing medication and improvement in respiratory symptoms), whichever is longer Get plenty of rest and push fluids Use OTC zyrtec for nasal congestion, runny nose, and/or sore throat Use OTC flonase for nasal congestion and runny nose Tessalon as needed for cough Use medications daily for symptom relief Use OTC medications like ibuprofen or tylenol as needed fever or pain Anticipate follow up with PCP regarding results Call or go to the ED if you have any new or worsening symptoms such as fever, worsening cough, shortness of breath, chest tightness, chest pain, turning blue, changes in mental status, etc...   Reviewed expectations re: course of current medical issues. Questions answered. Outlined signs and symptoms indicating need for more acute intervention. Patient verbalized understanding. After Visit Summary given.         Lestine Box, PA-C 07/10/19 1819

## 2019-07-10 NOTE — ED Triage Notes (Signed)
Pt presents with c/o sinus symptoms that began Monday and then severe sore throat yesterday

## 2019-07-12 LAB — NOVEL CORONAVIRUS, NAA: SARS-CoV-2, NAA: NOT DETECTED

## 2019-07-13 LAB — CULTURE, GROUP A STREP (THRC)

## 2019-07-16 ENCOUNTER — Encounter (HOSPITAL_COMMUNITY): Payer: Self-pay | Admitting: Emergency Medicine

## 2019-07-16 ENCOUNTER — Other Ambulatory Visit: Payer: Self-pay

## 2019-07-16 ENCOUNTER — Emergency Department (HOSPITAL_COMMUNITY)
Admission: EM | Admit: 2019-07-16 | Discharge: 2019-07-17 | Disposition: A | Discharge status: Home / Self Care | Payer: 59 | Attending: Emergency Medicine | Admitting: Emergency Medicine

## 2019-07-16 DIAGNOSIS — N939 Abnormal uterine and vaginal bleeding, unspecified: Secondary | ICD-10-CM | POA: Diagnosis present

## 2019-07-16 DIAGNOSIS — Z79899 Other long term (current) drug therapy: Secondary | ICD-10-CM | POA: Diagnosis not present

## 2019-07-16 DIAGNOSIS — N938 Other specified abnormal uterine and vaginal bleeding: Secondary | ICD-10-CM | POA: Insufficient documentation

## 2019-07-16 DIAGNOSIS — F17210 Nicotine dependence, cigarettes, uncomplicated: Secondary | ICD-10-CM | POA: Diagnosis not present

## 2019-07-16 LAB — BASIC METABOLIC PANEL
Anion gap: 10 (ref 5–15)
BUN: 12 mg/dL (ref 6–20)
CO2: 24 mmol/L (ref 22–32)
Calcium: 8.9 mg/dL (ref 8.9–10.3)
Chloride: 103 mmol/L (ref 98–111)
Creatinine, Ser: 0.75 mg/dL (ref 0.44–1.00)
GFR calc Af Amer: >60 mL/min (ref >60)
GFR calc non Af Amer: >60 mL/min (ref >60)
Glucose, Bld: 115 mg/dL — ABNORMAL HIGH (ref 70–99)
Potassium: 3.4 mmol/L — ABNORMAL LOW (ref 3.5–5.1)
Sodium: 137 mmol/L (ref 135–145)

## 2019-07-16 LAB — CBC WITH DIFFERENTIAL/PLATELET
Abs Immature Granulocytes: 0.13 10*3/uL — ABNORMAL HIGH (ref 0.00–0.07)
Basophils Absolute: 0.2 10*3/uL — ABNORMAL HIGH (ref 0.0–0.1)
Basophils Relative: 1 %
Eosinophils Absolute: 0.5 10*3/uL (ref 0.0–0.5)
Eosinophils Relative: 4 %
HCT: 29.8 % — ABNORMAL LOW (ref 36.0–46.0)
Hemoglobin: 9.1 g/dL — ABNORMAL LOW (ref 12.0–15.0)
Immature Granulocytes: 1 %
Lymphocytes Relative: 31 %
Lymphs Abs: 4.4 10*3/uL — ABNORMAL HIGH (ref 0.7–4.0)
MCH: 23.6 pg — ABNORMAL LOW (ref 26.0–34.0)
MCHC: 30.5 g/dL (ref 30.0–36.0)
MCV: 77.4 fL — ABNORMAL LOW (ref 80.0–100.0)
Monocytes Absolute: 1.3 10*3/uL — ABNORMAL HIGH (ref 0.1–1.0)
Monocytes Relative: 9 %
Neutro Abs: 7.7 10*3/uL (ref 1.7–7.7)
Neutrophils Relative %: 54 %
Platelets: 435 10*3/uL — ABNORMAL HIGH (ref 150–400)
RBC: 3.85 MIL/uL — ABNORMAL LOW (ref 3.87–5.11)
RDW: 17.7 % — ABNORMAL HIGH (ref 11.5–15.5)
WBC: 14.2 10*3/uL — ABNORMAL HIGH (ref 4.0–10.5)
nRBC: 0 % (ref 0.0–0.2)

## 2019-07-16 LAB — URINALYSIS, ROUTINE W REFLEX MICROSCOPIC

## 2019-07-16 LAB — URINALYSIS, MICROSCOPIC (REFLEX)
Bacteria, UA: NONE SEEN
RBC / HPF: >50 RBC/hpf (ref 0–5)
Squamous Epithelial / HPF: NONE SEEN (ref 0–5)
WBC, UA: NONE SEEN WBC/hpf (ref 0–5)

## 2019-07-16 LAB — WET PREP, GENITAL
Clue Cells Wet Prep HPF POC: NONE SEEN
Sperm: NONE SEEN
Trich, Wet Prep: NONE SEEN
Yeast Wet Prep HPF POC: NONE SEEN

## 2019-07-16 NOTE — ED Triage Notes (Signed)
Pt c/o heavy vaginal bleeding. States she has been on her menstrual cycle for 13 days, but tonight she has had heavy bleeding and large clots.

## 2019-07-16 NOTE — ED Provider Notes (Signed)
[facility] EMERGENCY DEPARTMENT Provider Note   CSN: [id number] Arrival date & time: 07/16/19  2021     History Chief Complaint  Patient presents with  . Vaginal Bleeding    Theresa Sawyer is a 39 y.o. female.  HPI     Theresa Sawyer is a 39 y.o. female with hx of BTL, who presents to the Emergency Department complaining of heavy vaginal bleeding and passing blood clots.  She states that she has been menstruating for 13 days.  She describes the initial bleeding is waxing and waning, but she reports heavier than usual vaginal bleeding with passing of large clots for 5 days.  Using a sanitary pad every 1-2 hours.  Bleeding is associated with crampy mid abdominal tenderness.  No fever, chills, nausea or vomiting.  Reports generalized fatigue without dizziness or syncope.  She does not have routine pelvic exams or Pap smears, but sees her PCP for women's health issues.  She denies history of birth control use.  States that she come to the emergency department this evening because she "got freaked out" after passing large clots of blood.  She planned to see her PCP tomorrow.  She is currently taking amoxicillin for a "sinus infection."     Past Medical History:  Diagnosis Date  . Anxiety   . GERD (gastroesophageal reflux disease)   . High cholesterol   . Psoriasis   . Sterilization consult 01/29/2013   BTL in hosp desired. Papers signed 12/16/12   . SVD (spontaneous vaginal delivery) 02/06/2013    Patient Active Problem List   Diagnosis Date Noted  . OSA on CPAP 04/24/2019  . Psoriasis 12/05/2018  . Situational anxiety 12/05/2018  . Intermittent hypertension 12/05/2018    Past Surgical History:  Procedure Laterality Date  . TUBAL LIGATION Bilateral 02/05/2013   Procedure: POST PARTUM TUBAL LIGATION;  Surgeon: Woodroe Mode, MD;  Location: [facility];  Service: Gynecology;  Laterality: Bilateral;     OB History    Gravida  3   Para  1   Term  1   Preterm  0   AB  2   Living  1     SAB  1   TAB  1   Ectopic  0   Multiple  0   Live Births  1           Family History  Problem Relation Age of Onset  . Diabetes Maternal Grandmother   . Diabetes Paternal Grandmother   . Anxiety disorder Mother   . Heart attack Brother 34       deceased from this  . Anxiety disorder Brother   . Cirrhosis Maternal Grandfather        alcohol related  . Colon cancer Neg Hx   . Esophageal cancer Neg Hx   . Colon polyps Neg Hx   . Stomach cancer Neg Hx   . Pancreatic cancer Neg Hx     Social History   Tobacco Use  . Smoking status: Current Some Day Smoker    Packs/day: 0.50    Types: Cigarettes  . Smokeless tobacco: Never Used  Substance Use Topics  . Alcohol use: No  . Drug use: No    Home Medications Prior to Admission medications   Medication Sig Start Date End Date Taking? Authorizing Provider  ALPRAZolam Duanne Moron) 0.5 MG tablet TAKE 1 TABLET(0.5 MG) BY MOUTH DAILY AS NEEDED FOR ANXIETY 05/28/19   Horald Pollen, MD  amoxicillin-clavulanate (AUGMENTIN)  875-125 MG tablet Take 1 tablet by mouth every 12 (twelve) hours for 10 days. 07/10/19 07/20/19  Wurst, Marye Round, PA-C  aspirin EC 81 MG tablet Take 81 mg by mouth as needed.     [provider]  Aspirin-Acetaminophen-Caffeine (GOODYS EXTRA STRENGTH PO) Take 1 Package by mouth as needed.    [provider]  benzonatate (TESSALON) 100 MG capsule Take 1 capsule (100 mg total) by mouth every 8 (eight) hours. 07/10/19   Wurst, [place], PA-C  cetirizine (ZYRTEC) 10 MG tablet Take 1 tablet (10 mg total) by mouth daily. 07/10/19   Wurst, [place], PA-C  escitalopram (LEXAPRO) 20 MG tablet Take 1 tablet (20 mg total) by mouth daily. 03/13/19 06/11/19  Horald Pollen, MD  famotidine (PEPCID) 40 MG tablet Take 1 tablet (40 mg total) by mouth 2 (two) times daily. 03/03/19   Thornton Park, MD  fluticasone (FLONASE) 50 MCG/ACT nasal spray Place 2 sprays into both nostrils daily.  07/10/19   Wurst, [place], PA-C  meloxicam (MOBIC) 7.5 MG tablet Take 1 tablet (7.5 mg total) by mouth daily. 06/20/19   Wurst, [place], PA-C  Multiple Vitamins-Minerals (MULTIVITAMIN WITH MINERALS) tablet Take 1 tablet by mouth daily.    [provider]  omeprazole (PRILOSEC) 40 MG capsule Take 1 capsule (40 mg total) by mouth daily. Patient taking differently: Take 40 mg by mouth daily. "taking PRN" 03/03/19   Thornton Park, MD  pantoprazole (PROTONIX) 40 MG tablet TAKE 1 TABLET(40 MG) BY MOUTH DAILY Patient not taking: Reported on 05/21/2019 05/20/19   McVey, Gelene Mink, PA-C    Allergies    Patient has no known allergies.  Review of Systems   Review of Systems  Constitutional: Positive for fatigue. Negative for activity change, appetite change, chills and fever.  Respiratory: Negative for chest tightness and shortness of breath.   Gastrointestinal: Positive for abdominal pain. Negative for diarrhea, nausea and vomiting.  Genitourinary: Positive for dysuria, frequency, urgency and vaginal bleeding. Negative for decreased urine volume, difficulty urinating, flank pain, hematuria, pelvic pain and vaginal pain.  Musculoskeletal: Negative for back pain.  Skin: Negative for rash.  Neurological: Negative for dizziness, syncope, weakness (generalized weakness) and numbness.  Hematological: Negative for adenopathy.  Psychiatric/Behavioral: Negative for confusion.    Physical Exam Updated Vital Signs BP (!) 145/84   Pulse 89   Temp 97.9 F (36.6 C)   Resp 18   Ht 5' 5"  (1.651 m)   Wt 113.4 kg   LMP 07/03/2019 (Exact Date)   SpO2 100%   BMI 41.60 kg/m   Physical Exam Vitals and nursing note reviewed.  Constitutional:      Appearance: Normal appearance. She is not ill-appearing or toxic-appearing.  HENT:     Head: Normocephalic.  Cardiovascular:     Rate and Rhythm: Normal rate and regular rhythm.     Pulses: Normal pulses.  Pulmonary:     Effort:  Pulmonary effort is normal.     Breath sounds: Normal breath sounds.  Chest:     Chest wall: No tenderness.  Abdominal:     Palpations: Abdomen is soft.     Tenderness: There is no abdominal tenderness. There is no right CVA tenderness, left CVA tenderness, guarding or rebound.  Genitourinary:    Cervix: Cervical bleeding present.     Uterus: Not tender.      Adnexa:        Right: No mass or tenderness.         Left: No mass  or tenderness.       Comments: Moderate amt of blood in the vaginal vault with passage of quarter sized clots.  Exam limited due to body habitus, but no palpable adnexal masses or tenderness.  Musculoskeletal:        General: No tenderness. Normal range of motion.  Skin:    General: Skin is warm.     Findings: No rash.  Neurological:     Mental Status: She is alert and oriented to person, place, and time.     Sensory: No sensory deficit.     Motor: No weakness.  Psychiatric:        Judgment: Judgment normal.     ED Results / Procedures / Treatments   Labs (all labs ordered are listed, but only abnormal results are displayed) Labs Reviewed  WET PREP, GENITAL - Abnormal; Notable for the following components:      Result Value   WBC, Wet Prep HPF POC RARE (*)    All other components within normal limits  BASIC METABOLIC PANEL - Abnormal; Notable for the following components:   Potassium 3.4 (*)    Glucose, Bld 115 (*)    All other components within normal limits  CBC WITH DIFFERENTIAL/PLATELET - Abnormal; Notable for the following components:   WBC 14.2 (*)    RBC 3.85 (*)    Hemoglobin 9.1 (*)    HCT 29.8 (*)    MCV 77.4 (*)    MCH 23.6 (*)    RDW 17.7 (*)    Platelets 435 (*)    Lymphs Abs 4.4 (*)    Monocytes Absolute 1.3 (*)    Basophils Absolute 0.2 (*)    Abs Immature Granulocytes 0.13 (*)    All other components within normal limits  URINALYSIS, ROUTINE W REFLEX MICROSCOPIC - Abnormal; Notable for the following components:   Color, Urine  RED (*)    APPearance CLOUDY (*)    Glucose, UA   (*)    Value: TEST NOT REPORTED DUE TO COLOR INTERFERENCE OF URINE PIGMENT   Hgb urine dipstick   (*)    Value: TEST NOT REPORTED DUE TO COLOR INTERFERENCE OF URINE PIGMENT   Bilirubin Urine   (*)    Value: TEST NOT REPORTED DUE TO COLOR INTERFERENCE OF URINE PIGMENT   Ketones, ur   (*)    Value: TEST NOT REPORTED DUE TO COLOR INTERFERENCE OF URINE PIGMENT   Protein, ur   (*)    Value: TEST NOT REPORTED DUE TO COLOR INTERFERENCE OF URINE PIGMENT   Nitrite   (*)    Value: TEST NOT REPORTED DUE TO COLOR INTERFERENCE OF URINE PIGMENT   Leukocytes,Ua   (*)    Value: TEST NOT REPORTED DUE TO COLOR INTERFERENCE OF URINE PIGMENT   All other components within normal limits  URINALYSIS, MICROSCOPIC (REFLEX)  POC URINE PREG, ED  GC/CHLAMYDIA PROBE AMP ([place]) NOT AT [facility]    EKG None  Radiology No results found.  Procedures Procedures (including critical care time)  Medications Ordered in ED Medications - No data to display  ED Course  I have reviewed the triage vital signs and the nursing notes.  Pertinent labs & imaging results that were available during my care of the patient were reviewed by me and considered in my medical decision making (see chart for details).    MDM Rules/Calculators/A&P  Pt with dysfunctional uterine bleeding for 2 weeks. Urine pregnancy is negative.   hgb in November was 10.4, 9.1 tonight.  No symptomatic anemia.  She has an appt with her PCP tomorrow who she also sees for her gynecological care, I feel she is appropriate for d/c home. Will start her on Megace and she will be given referral info for [facility] as well.     Final Clinical Impression(s) / ED Diagnoses Final diagnoses:  Dysfunctional uterine bleeding    Rx / DC Orders ED Discharge Orders    None       Kem Parkinson, PA-C 07/17/19 0041    Daleen Bo, MD 07/17/19 1719

## 2019-07-17 ENCOUNTER — Other Ambulatory Visit: Payer: Self-pay | Admitting: Gastroenterology

## 2019-07-17 LAB — POC URINE PREG, ED: Preg Test, Ur: NEGATIVE

## 2019-07-17 MED ORDER — MEGESTROL ACETATE 40 MG PO TABS
ORAL_TABLET | ORAL | Status: AC
  Filled 2019-07-17: qty 1

## 2019-07-17 MED ORDER — MEGESTROL ACETATE 40 MG PO TABS: 40.0000 mg | ORAL_TABLET | Freq: Three times a day (TID) | ORAL | 0 refills | Status: DC

## 2019-07-17 MED ORDER — MEGESTROL ACETATE 40 MG PO TABS
40.0000 mg | ORAL_TABLET | Freq: Every day | ORAL | Status: DC
  Administered 2019-07-17: 40 mg via ORAL
  Filled 2019-07-17 (×2): qty 1

## 2019-07-17 NOTE — Discharge Instructions (Addendum)
Be sure to follow-up with your primary doctor for recheck.  I have also listed the follow-up information for [facility].  Take the Megace as directed as this should stop your bleeding.  Return to the emergency department for any worsening symptoms such as abdominal pain, fever, or vomiting.

## 2019-07-18 LAB — GC/CHLAMYDIA PROBE AMP (~~LOC~~) NOT AT ARMC
Chlamydia: NEGATIVE
Neisseria Gonorrhea: NEGATIVE

## 2019-07-24 ENCOUNTER — Ambulatory Visit (INDEPENDENT_AMBULATORY_CARE_PROVIDER_SITE_OTHER): Payer: 59 | Admitting: Emergency Medicine

## 2019-07-24 ENCOUNTER — Encounter: Payer: Self-pay | Admitting: Emergency Medicine

## 2019-07-24 ENCOUNTER — Other Ambulatory Visit: Payer: Self-pay

## 2019-07-24 VITALS — BP 114/72 | HR 79 | Temp 98.8°F | Resp 18 | Ht 65.0 in | Wt 240.0 lb

## 2019-07-24 DIAGNOSIS — D500 Iron deficiency anemia secondary to blood loss (chronic): Secondary | ICD-10-CM | POA: Diagnosis not present

## 2019-07-24 DIAGNOSIS — Z23 Encounter for immunization: Secondary | ICD-10-CM | POA: Diagnosis not present

## 2019-07-24 DIAGNOSIS — N938 Other specified abnormal uterine and vaginal bleeding: Secondary | ICD-10-CM

## 2019-07-24 LAB — POCT CBC
Granulocyte percent: 61.6 %G (ref 37–80)
HCT, POC: 29.3 % (ref 29–41)
Hemoglobin: 9.3 g/dL — AB (ref 11–14.6)
Lymph, poc: 3.8 — AB (ref 0.6–3.4)
MCH, POC: 23.4 pg — AB (ref 27–31.2)
MCHC: 31.8 g/dL (ref 31.8–35.4)
MCV: 73.4 fL — AB (ref 76–111)
MID (cbc): 1.3 — AB (ref 0–0.9)
MPV: 7 fL (ref 0–99.8)
POC Granulocyte: 8.2 — AB (ref 2–6.9)
POC LYMPH PERCENT: 28.8 %L (ref 10–50)
POC MID %: 9.6 %M (ref 0–12)
Platelet Count, POC: 447 10*3/uL — AB (ref 142–424)
RBC: 3.99 M/uL — AB (ref 4.04–5.48)
RDW, POC: 19.3 %
WBC: 13.3 10*3/uL — AB (ref 4.6–10.2)

## 2019-07-24 NOTE — Progress Notes (Signed)
Theresa Sawyer 39 y.o.   Chief Complaint  Patient presents with  . dysfunional uterine bleeding    hospital follow up ED 07/16/2019  . Fatigue    X 2 DAYS AFTER FIRST PERIOD 12 /24/2020    HISTORY OF PRESENT ILLNESS: This is a 39 y.o. female here for hospital follow-up, emergency room visit on 07/16/2019 with final diagnosis of dysfunctional uterine bleeding.  She presented with a 2-week history of abnormal vaginal bleeding.  Has not been evaluated by gynecologist yet.  In the emergency department she was started on megestrol, still taking it.  Not taking any blood thinners.  Denies pelvic or abdominal pain.  Denies syncopal episode or dizziness.  Bleeding has significantly stopped.  HPI   Prior to Admission medications   Medication Sig Start Date End Date Taking? Authorizing Provider  ALPRAZolam (XANAX) 0.5 MG tablet TAKE 1 TABLET(0.5 MG) BY MOUTH DAILY AS NEEDED FOR ANXIETY Patient taking differently: Take 0.5 mg by mouth daily as needed for anxiety.  05/28/19  Yes Andersen Iorio, Ines Bloomer, MD  cetirizine (ZYRTEC) 10 MG tablet Take 1 tablet (10 mg total) by mouth daily. Patient taking differently: Take 10 mg by mouth daily as needed for allergies.  07/10/19  Yes Wurst, [place], PA-C  famotidine (PEPCID) 40 MG tablet TAKE 1 TABLET(40 MG) BY MOUTH TWICE DAILY 07/17/19  Yes Thornton Park, MD  Ferrous Sulfate (IRON PO) Take 65 mg by mouth daily.   Yes [provider]  fluticasone (FLONASE) 50 MCG/ACT nasal spray Place 2 sprays into both nostrils daily. Patient taking differently: Place 2 sprays into both nostrils daily as needed for allergies or rhinitis.  07/10/19  Yes Wurst, [place], PA-C  megestrol (MEGACE) 40 MG tablet Take 1 tablet (40 mg total) by mouth 3 (three) times daily. Until bleeding stops, then take one tablet daily until you see your gynecologist 07/17/19  Yes Triplett, Tammy, PA-C  omeprazole (PRILOSEC) 40 MG capsule TAKE 1 CAPSULE(40 MG) BY MOUTH DAILY 07/17/19  Yes  Thornton Park, MD  aspirin EC 81 MG tablet Take 81 mg by mouth daily as needed for mild pain.     [provider]  Aspirin-Acetaminophen-Caffeine (GOODYS EXTRA STRENGTH PO) Take 1 packet by mouth as needed.     [provider]  benzonatate (TESSALON) 100 MG capsule Take 1 capsule (100 mg total) by mouth every 8 (eight) hours. Patient not taking: Reported on 07/24/2019 07/10/19   Wurst, [place], PA-C  escitalopram (LEXAPRO) 20 MG tablet Take 1 tablet (20 mg total) by mouth daily. 03/13/19 07/16/19  Horald Pollen, MD  meloxicam (MOBIC) 7.5 MG tablet Take 1 tablet (7.5 mg total) by mouth daily. Patient not taking: Reported on 07/24/2019 06/20/19   Wurst, [place], PA-C  Multiple Vitamins-Minerals (MULTIVITAMIN WITH MINERALS) tablet Take 1 tablet by mouth daily.    [provider]  pantoprazole (PROTONIX) 40 MG tablet TAKE 1 TABLET(40 MG) BY MOUTH DAILY Patient not taking: Reported on 07/24/2019 05/20/19   McVey, Gelene Mink, PA-C    Not on File  Patient Active Problem List   Diagnosis Date Noted  . OSA on CPAP 04/24/2019  . Psoriasis 12/05/2018  . Situational anxiety 12/05/2018  . Intermittent hypertension 12/05/2018    Past Medical History:  Diagnosis Date  . Anxiety   . GERD (gastroesophageal reflux disease)   . High cholesterol   . Psoriasis   . Sterilization consult 01/29/2013   BTL in hosp desired. Papers signed 12/16/12   . SVD (spontaneous vaginal  delivery) 02/06/2013    Past Surgical History:  Procedure Laterality Date  . TUBAL LIGATION Bilateral 02/05/2013   Procedure: POST PARTUM TUBAL LIGATION;  Surgeon: Woodroe Mode, MD;  Location: [facility];  Service: Gynecology;  Laterality: Bilateral;    Social History   Socioeconomic History  . Marital status: Legally Separated    Spouse name: Not on file  . Number of children: Not on file  . Years of education: Not on file  . Highest education level: Not on file  Occupational History  .  Not on file  Tobacco Use  . Smoking status: Current Some Day Smoker    Packs/day: 0.50    Types: Cigarettes  . Smokeless tobacco: Never Used  Substance and Sexual Activity  . Alcohol use: No  . Drug use: No  . Sexual activity: Yes    Birth control/protection: Surgical  Other Topics Concern  . Not on file  Social History Narrative  . Not on file   Social Determinants of Health   Financial Resource Strain:   . Difficulty of Paying Living Expenses: Not on file  Food Insecurity:   . Worried About Charity fundraiser in the Last Year: Not on file  . Ran Out of Food in the Last Year: Not on file  Transportation Needs:   . Lack of Transportation (Medical): Not on file  . Lack of Transportation (Non-Medical): Not on file  Physical Activity:   . Days of Exercise per Week: Not on file  . Minutes of Exercise per Session: Not on file  Stress:   . Feeling of Stress : Not on file  Social Connections:   . Frequency of Communication with Friends and Family: Not on file  . Frequency of Social Gatherings with Friends and Family: Not on file  . Attends Religious Services: Not on file  . Active Member of Clubs or Organizations: Not on file  . Attends Archivist Meetings: Not on file  . Marital Status: Not on file  Intimate Partner Violence:   . Fear of Current or Ex-Partner: Not on file  . Emotionally Abused: Not on file  . Physically Abused: Not on file  . Sexually Abused: Not on file    Family History  Problem Relation Age of Onset  . Diabetes Maternal Grandmother   . Diabetes Paternal Grandmother   . Anxiety disorder Mother   . Heart attack Brother 34       deceased from this  . Anxiety disorder Brother   . Cirrhosis Maternal Grandfather        alcohol related  . Colon cancer Neg Hx   . Esophageal cancer Neg Hx   . Colon polyps Neg Hx   . Stomach cancer Neg Hx   . Pancreatic cancer Neg Hx      Review of Systems  Constitutional: Negative.  Negative for chills  and fever.  HENT: Negative.  Negative for congestion and sore throat.   Eyes: Negative.   Respiratory: Negative.  Negative for cough and shortness of breath.   Cardiovascular: Negative.  Negative for chest pain and palpitations.  Gastrointestinal: Negative.  Negative for abdominal pain, diarrhea, nausea and vomiting.  Genitourinary: Negative.  Negative for dysuria and hematuria.  Musculoskeletal: Negative.  Negative for myalgias and neck pain.  Skin: Negative.  Negative for rash.  Neurological: Negative.  Negative for dizziness and headaches.  All other systems reviewed and are negative.    Physical Exam Vitals reviewed.  Constitutional:  Appearance: Normal appearance. She is obese.  HENT:     Head: Normocephalic.  Eyes:     Extraocular Movements: Extraocular movements intact.     Conjunctiva/sclera: Conjunctivae normal.     Pupils: Pupils are equal, round, and reactive to light.  Cardiovascular:     Rate and Rhythm: Normal rate and regular rhythm.     Pulses: Normal pulses.     Heart sounds: Normal heart sounds.  Pulmonary:     Effort: Pulmonary effort is normal.     Breath sounds: Normal breath sounds.  Abdominal:     General: There is no distension.     Palpations: Abdomen is soft.     Tenderness: There is no abdominal tenderness.  Musculoskeletal:        General: Normal range of motion.     Cervical back: Normal range of motion and neck supple.  Skin:    General: Skin is warm and dry.     Capillary Refill: Capillary refill takes less than 2 seconds.  Neurological:     General: No focal deficit present.     Mental Status: She is alert and oriented to person, place, and time.  Psychiatric:        Mood and Affect: Mood normal.        Behavior: Behavior normal.    Results for orders placed or performed in visit on 07/24/19 (from the past 24 hour(s))  POCT CBC     Status: Abnormal   Collection Time: 07/24/19 10:23 AM  Result Value Ref Range   WBC 13.3 (A) 4.6 -  10.2 K/uL   Lymph, poc 3.8 (A) 0.6 - 3.4   POC LYMPH PERCENT 28.8 10 - 50 %L   MID (cbc) 1.3 (A) 0 - 0.9   POC MID % 9.6 0 - 12 %M   POC Granulocyte 8.2 (A) 2 - 6.9   Granulocyte percent 61.6 37 - 80 %G   RBC 3.99 (A) 4.04 - 5.48 M/uL   Hemoglobin 9.3 (A) 11 - 14.6 g/dL   HCT, POC 29.3 29 - 41 %   MCV 73.4 (A) 76 - 111 fL   MCH, POC 23.4 (A) 27 - 31.2 pg   MCHC 31.8 31.8 - 35.4 g/dL   RDW, POC 19.3 %   Platelet Count, POC 447 (A) 142 - 424 K/uL   MPV 7.0 0 - 99.8 fL   A total of 30 minutes was spent in the room with the patient, greater than 50% of which was in counseling/coordination of care regarding review of most recent emergency room visit, most recent blood work results, differential diagnosis of dysfunctional uterine bleeding, need for gynecological evaluation and possible ultrasound, medication review, prognosis, and need for follow-up after gynecology evaluation.   ASSESSMENT & PLAN: Meleane was seen today for dysfunional uterine bleeding and fatigue.  Diagnoses and all orders for this visit:  Dysfunctional uterine bleeding -     POCT CBC -     Ambulatory referral to Gynecology  Need for prophylactic vaccination and inoculation against influenza -     Flu Vaccine QUAD 36+ mos IM  Anemia due to chronic blood loss -     POCT CBC -     Orthostatic vital signs    Patient Instructions       If you have lab work done today you will be contacted with your lab results within the next 2 weeks.  If you have not heard from [place] then please contact us. The  fastest way to get your results is to register for My Chart.   IF you received an x-ray today, you will receive an invoice from [facility]. Please contact [facility] at [phone] with questions or concerns regarding your invoice.   IF you received labwork today, you will receive an invoice from [facility]. Please contact [facility] at [phone] with questions or concerns regarding your invoice.   Our  billing staff will not be able to assist you with questions regarding bills from these companies.  You will be contacted with the lab results as soon as they are available. The fastest way to get your results is to activate your My Chart account. Instructions are located on the last page of this paperwork. If you have not heard from [place] regarding the results in 2 weeks, please contact this office.     Anemia  Anemia is a condition in which you do not have enough red blood cells or hemoglobin. Hemoglobin is a substance in red blood cells that carries oxygen. When you do not have enough red blood cells or hemoglobin (are anemic), your body cannot get enough oxygen and your organs may not work properly. As a result, you may feel very tired or have other problems. What are the causes? Common causes of anemia include:  Excessive bleeding. Anemia can be caused by excessive bleeding inside or outside the body, including bleeding from the intestine or from periods in women.  Poor nutrition.  Long-lasting (chronic) kidney, thyroid, and liver disease.  Bone marrow disorders.  Cancer and treatments for cancer.  HIV (human immunodeficiency virus) and AIDS (acquired immunodeficiency syndrome).  Treatments for HIV and AIDS.  Spleen problems.  Blood disorders.  Infections, medicines, and autoimmune disorders that destroy red blood cells. What are the signs or symptoms? Symptoms of this condition include:  Minor weakness.  Dizziness.  Headache.  Feeling heartbeats that are irregular or faster than normal (palpitations).  Shortness of breath, especially with exercise.  Paleness.  Cold sensitivity.  Indigestion.  Nausea.  Difficulty sleeping.  Difficulty concentrating. Symptoms may occur suddenly or develop slowly. If your anemia is mild, you may not have symptoms. How is this diagnosed? This condition is diagnosed based on:  Blood tests.  Your medical history.  A physical  exam.  Bone marrow biopsy. Your health care provider may also check your stool (feces) for blood and may do additional testing to look for the cause of your bleeding. You may also have other tests, including:  Imaging tests, such as a CT scan or MRI.  Endoscopy.  Colonoscopy. How is this treated? Treatment for this condition depends on the cause. If you continue to lose a lot of blood, you may need to be treated at a hospital. Treatment may include:  Taking supplements of iron, vitamin V37, or folic acid.  Taking a hormone medicine (erythropoietin) that can help to stimulate red blood cell growth.  Having a blood transfusion. This may be needed if you lose a lot of blood.  Making changes to your diet.  Having surgery to remove your spleen. Follow these instructions at home:  Take over-the-counter and prescription medicines only as told by your health care provider.  Take supplements only as told by your health care provider.  Follow any diet instructions that you were given.  Keep all follow-up visits as told by your health care provider. This is important. Contact a health care provider if:  You develop new bleeding anywhere in the body. Get help  right away if:  You are very weak.  You are short of breath.  You have pain in your abdomen or chest.  You are dizzy or feel faint.  You have trouble concentrating.  You have bloody or black, tarry stools.  You vomit repeatedly or you vomit up blood. Summary  Anemia is a condition in which you do not have enough red blood cells or enough of a substance in your red blood cells that carries oxygen (hemoglobin).  Symptoms may occur suddenly or develop slowly.  If your anemia is mild, you may not have symptoms.  This condition is diagnosed with blood tests as well as a medical history and physical exam. Other tests may be needed.  Treatment for this condition depends on the cause of the anemia. This information is  not intended to replace advice given to you by your health care provider. Make sure you discuss any questions you have with your health care provider. Document Revised: 06/08/2017 Document Reviewed: 07/28/2016 Elsevier Patient Education  2020 Elsevier Inc.      Agustina Caroli, MD [facility]

## 2019-07-24 NOTE — Patient Instructions (Addendum)
If you have lab work done today you will be contacted with your lab results within the next 2 weeks.  If you have not heard from [place] then please contact us. The fastest way to get your results is to register for My Chart.   IF you received an x-ray today, you will receive an invoice from [facility]. Please contact [facility] at [phone] with questions or concerns regarding your invoice.   IF you received labwork today, you will receive an invoice from [facility]. Please contact [facility] at [phone] with questions or concerns regarding your invoice.   Our billing staff will not be able to assist you with questions regarding bills from these companies.  You will be contacted with the lab results as soon as they are available. The fastest way to get your results is to activate your My Chart account. Instructions are located on the last page of this paperwork. If you have not heard from [place] regarding the results in 2 weeks, please contact this office.     Anemia  Anemia is a condition in which you do not have enough red blood cells or hemoglobin. Hemoglobin is a substance in red blood cells that carries oxygen. When you do not have enough red blood cells or hemoglobin (are anemic), your body cannot get enough oxygen and your organs may not work properly. As a result, you may feel very tired or have other problems. What are the causes? Common causes of anemia include:  Excessive bleeding. Anemia can be caused by excessive bleeding inside or outside the body, including bleeding from the intestine or from periods in women.  Poor nutrition.  Long-lasting (chronic) kidney, thyroid, and liver disease.  Bone marrow disorders.  Cancer and treatments for cancer.  HIV (human immunodeficiency virus) and AIDS (acquired immunodeficiency syndrome).  Treatments for HIV and AIDS.  Spleen problems.  Blood disorders.  Infections, medicines, and autoimmune disorders that  destroy red blood cells. What are the signs or symptoms? Symptoms of this condition include:  Minor weakness.  Dizziness.  Headache.  Feeling heartbeats that are irregular or faster than normal (palpitations).  Shortness of breath, especially with exercise.  Paleness.  Cold sensitivity.  Indigestion.  Nausea.  Difficulty sleeping.  Difficulty concentrating. Symptoms may occur suddenly or develop slowly. If your anemia is mild, you may not have symptoms. How is this diagnosed? This condition is diagnosed based on:  Blood tests.  Your medical history.  A physical exam.  Bone marrow biopsy. Your health care provider may also check your stool (feces) for blood and may do additional testing to look for the cause of your bleeding. You may also have other tests, including:  Imaging tests, such as a CT scan or MRI.  Endoscopy.  Colonoscopy. How is this treated? Treatment for this condition depends on the cause. If you continue to lose a lot of blood, you may need to be treated at a hospital. Treatment may include:  Taking supplements of iron, vitamin C62, or folic acid.  Taking a hormone medicine (erythropoietin) that can help to stimulate red blood cell growth.  Having a blood transfusion. This may be needed if you lose a lot of blood.  Making changes to your diet.  Having surgery to remove your spleen. Follow these instructions at home:  Take over-the-counter and prescription medicines only as told by your health care provider.  Take supplements only as told by your health care provider.  Follow any diet instructions that  you were given.  Keep all follow-up visits as told by your health care provider. This is important. Contact a health care provider if:  You develop new bleeding anywhere in the body. Get help right away if:  You are very weak.  You are short of breath.  You have pain in your abdomen or chest.  You are dizzy or feel faint.  You  have trouble concentrating.  You have bloody or black, tarry stools.  You vomit repeatedly or you vomit up blood. Summary  Anemia is a condition in which you do not have enough red blood cells or enough of a substance in your red blood cells that carries oxygen (hemoglobin).  Symptoms may occur suddenly or develop slowly.  If your anemia is mild, you may not have symptoms.  This condition is diagnosed with blood tests as well as a medical history and physical exam. Other tests may be needed.  Treatment for this condition depends on the cause of the anemia. This information is not intended to replace advice given to you by your health care provider. Make sure you discuss any questions you have with your health care provider. Document Revised: 06/08/2017 Document Reviewed: 07/28/2016 Elsevier Patient Education  [place].

## 2019-07-30 ENCOUNTER — Encounter: Payer: BC Managed Care – PPO | Admitting: Emergency Medicine

## 2019-07-30 ENCOUNTER — Other Ambulatory Visit: Payer: Self-pay

## 2019-07-30 ENCOUNTER — Encounter: Payer: Self-pay | Admitting: Emergency Medicine

## 2019-07-30 ENCOUNTER — Ambulatory Visit: Payer: 59 | Admitting: Emergency Medicine

## 2019-07-30 VITALS — BP 140/82 | HR 87 | Temp 97.0°F | Wt 238.8 lb

## 2019-07-30 DIAGNOSIS — D500 Iron deficiency anemia secondary to blood loss (chronic): Secondary | ICD-10-CM

## 2019-07-30 DIAGNOSIS — F418 Other specified anxiety disorders: Secondary | ICD-10-CM

## 2019-07-30 DIAGNOSIS — G4733 Obstructive sleep apnea (adult) (pediatric): Secondary | ICD-10-CM

## 2019-07-30 DIAGNOSIS — Z9989 Dependence on other enabling machines and devices: Secondary | ICD-10-CM

## 2019-07-30 DIAGNOSIS — F411 Generalized anxiety disorder: Secondary | ICD-10-CM

## 2019-07-30 DIAGNOSIS — M7661 Achilles tendinitis, right leg: Secondary | ICD-10-CM

## 2019-07-30 DIAGNOSIS — L409 Psoriasis, unspecified: Secondary | ICD-10-CM

## 2019-07-30 MED ORDER — PREDNISONE 20 MG PO TABS: 40.0000 mg | ORAL_TABLET | Freq: Every day | ORAL | 0 refills | Status: AC

## 2019-07-30 MED ORDER — ALPRAZOLAM 0.5 MG PO TABS: ORAL_TABLET | ORAL | 0 refills | Status: DC

## 2019-07-30 MED ORDER — BETAMETHASONE DIPROPIONATE AUG 0.05 % EX CREA: TOPICAL_CREAM | Freq: Two times a day (BID) | CUTANEOUS | 3 refills | Status: DC

## 2019-07-30 NOTE — Patient Instructions (Addendum)
   If you have lab work done today you will be contacted with your lab results within the next 2 weeks.  If you have not heard from us then please contact us. The fastest way to get your results is to register for My Chart.   IF you received an x-ray today, you will receive an invoice from [facility]. Please contact [facility] at [phone] with questions or concerns regarding your invoice.   IF you received labwork today, you will receive an invoice from [facility]. Please contact [facility] at [phone] with questions or concerns regarding your invoice.   Our billing staff will not be able to assist you with questions regarding bills from these companies.  You will be contacted with the lab results as soon as they are available. The fastest way to get your results is to activate your My Chart account. Instructions are located on the last page of this paperwork. If you have not heard from us regarding the results in 2 weeks, please contact this office.      Psoriasis Psoriasis is a long-term (chronic) skin condition. It occurs because your body's defense system (immune system) causes skin cells to form too quickly. This causes raised, red patches (plaques) on your skin that look silvery. The patches may be on all areas of your body. They can be any size or shape. Psoriasis can come and go. It can range from mild to very bad. It cannot be passed from one person to another (is not contagious). There is no cure for this condition, but it can be helped with treatment. What are the causes? The cause of psoriasis is not known. Some things can make it worse. These are:  Skin damage, such as cuts, scrapes, sunburn, and dryness.  Not getting enough sunlight.  Some medicines.  Alcohol.  Tobacco.  Stress.  Infections. What increases the risk?  Having a family member with psoriasis.  Being very overweight (obese).  Being 20-40 years old.  Taking certain  medicines. What are the signs or symptoms? There are different types of psoriasis. The types are:  Plaque. This is the most common. Symptoms include red, raised patches with a silvery coating. These may be itchy. Your nails may be crumbly or fall off.  Guttate. Symptoms include small red spots on your stomach area, arms, and legs. These may happen after you have been sick, such as with strep throat.  Inverse. Symptoms include patches in your armpits, under your breasts, private areas, or on your butt.  Pustular. Symptoms include pus-filled bumps on the palms of your hands or the soles of your feet. You also may feel very tired, weak, have a fever, and not be hungry.  Erythrodermic. Symptoms include bright red skin that looks burned. You may have a fast heartbeat and a body temperature that is too high or too low. You may be itchy or in pain.  Sebopsoriasis. Symptoms include red patches on your scalp, forehead, and face that are greasy.  Psoriatic arthritis. Symptoms include swollen, painful joints along with scaly skin patches. How is this treated? There is no cure for this condition, but treatment can:  Help your skin heal.  Lessen itching and irritation and swelling (inflammation).  Slow the growth of new skin cells.  Help your body's defense system respond better to your skin. Treatment may include:  Creams or ointments.  Light therapy. This may include natural sunlight or light therapy in a doctor's office.  Medicines. These can help   your body better manage skin cells. They may be used with light therapy or ointments. Medicines may include pills or injections. You may also get antibiotic medicines if you have an infection. Follow these instructions at home: Skin Care  Apply lotion to your skin as needed. Only use those that your doctor has said are okay.  Apply cool, wet cloths (cold compresses) to the affected areas.  Do not use a hot tub or take hot showers. Use  slightly warm, not hot, water when taking showers and baths.  Do not scratch your skin. Lifestyle   Do not use any products that contain nicotine or tobacco, such as cigarettes, e-cigarettes, and chewing tobacco. If you need help quitting, ask your doctor.  Lower your stress.  Keep a healthy weight.  Go out in the sun as told by your doctor. Do not get sunburned.  Join a support group. Medicines  Take or use over-the-counter and prescription medicines only as told by your doctor.  If you were prescribed an antibiotic medicine, take it as told by your doctor. Do not stop using the antibiotic even if you start to feel better. Alcohol use If you drink alcohol:  Limit how much you use: ? 0-1 drink a day for women. ? 0-2 drinks a day for men.  Be aware of how much alcohol is in your drink. In the [place] one drink equals one 12 oz bottle of beer (355 mL), one 5 oz glass of wine (148 mL), or one 1 oz glass of hard liquor (44 mL). General instructions  Keep a journal to track the things that cause symptoms (triggers). Try to avoid these things.  See a counselor if you feel the support would help.  Keep all follow-up visits as told by your doctor. This is important. Contact a doctor if:  You have a fever.  Your pain gets worse.  You have more redness or warmth in the affected areas.  You have new or worse pain or stiffness in your joints.  Your nails start to break easily or pull away from the nail bed.  You feel very sad (depressed). Summary  Psoriasis is a long-term (chronic) skin condition.  There is no cure for this condition, but treatment can help manage it.  Keep a journal to track the things that cause symptoms.  Take or use over-the-counter and prescription medicines only as told by your doctor.  Keep all follow-up visits as told by your doctor. This is important. This information is not intended to replace advice given to you by your health care provider.  Make sure you discuss any questions you have with your health care provider. Document Revised: 04/30/2018 Document Reviewed: 04/30/2018 Elsevier Patient Education  2020 [employer]  Anemia  Anemia is a condition in which you do not have enough red blood cells or hemoglobin. Hemoglobin is a substance in red blood cells that carries oxygen. When you do not have enough red blood cells or hemoglobin (are anemic), your body cannot get enough oxygen and your organs may not work properly. As a result, you may feel very tired or have other problems. What are the causes? Common causes of anemia include:  Excessive bleeding. Anemia can be caused by excessive bleeding inside or outside the body, including bleeding from the intestine or from periods in women.  Poor nutrition.  Long-lasting (chronic) kidney, thyroid, and liver disease.  Bone marrow disorders.  Cancer and treatments for cancer.  HIV (human immunodeficiency virus) and AIDS (  acquired immunodeficiency syndrome).  Treatments for HIV and AIDS.  Spleen problems.  Blood disorders.  Infections, medicines, and autoimmune disorders that destroy red blood cells. What are the signs or symptoms? Symptoms of this condition include:  Minor weakness.  Dizziness.  Headache.  Feeling heartbeats that are irregular or faster than normal (palpitations).  Shortness of breath, especially with exercise.  Paleness.  Cold sensitivity.  Indigestion.  Nausea.  Difficulty sleeping.  Difficulty concentrating. Symptoms may occur suddenly or develop slowly. If your anemia is mild, you may not have symptoms. How is this diagnosed? This condition is diagnosed based on:  Blood tests.  Your medical history.  A physical exam.  Bone marrow biopsy. Your health care provider may also check your stool (feces) for blood and may do additional testing to look for the cause of your bleeding. You may also have other tests,  including:  Imaging tests, such as a CT scan or MRI.  Endoscopy.  Colonoscopy. How is this treated? Treatment for this condition depends on the cause. If you continue to lose a lot of blood, you may need to be treated at a hospital. Treatment may include:  Taking supplements of iron, vitamin X32, or folic acid.  Taking a hormone medicine (erythropoietin) that can help to stimulate red blood cell growth.  Having a blood transfusion. This may be needed if you lose a lot of blood.  Making changes to your diet.  Having surgery to remove your spleen. Follow these instructions at home:  Take over-the-counter and prescription medicines only as told by your health care provider.  Take supplements only as told by your health care provider.  Follow any diet instructions that you were given.  Keep all follow-up visits as told by your health care provider. This is important. Contact a health care provider if:  You develop new bleeding anywhere in the body. Get help right away if:  You are very weak.  You are short of breath.  You have pain in your abdomen or chest.  You are dizzy or feel faint.  You have trouble concentrating.  You have bloody or black, tarry stools.  You vomit repeatedly or you vomit up blood. Summary  Anemia is a condition in which you do not have enough red blood cells or enough of a substance in your red blood cells that carries oxygen (hemoglobin).  Symptoms may occur suddenly or develop slowly.  If your anemia is mild, you may not have symptoms.  This condition is diagnosed with blood tests as well as a medical history and physical exam. Other tests may be needed.  Treatment for this condition depends on the cause of the anemia. This information is not intended to replace advice given to you by your health care provider. Make sure you discuss any questions you have with your health care provider. Document Revised: 06/08/2017 Document Reviewed:  07/28/2016 Elsevier Patient Education  [place].

## 2019-07-30 NOTE — Progress Notes (Signed)
Theresa Sawyer 39 y.o.   Chief Complaint  Patient presents with  . Leg Pain    pain in the back of right calf down to the ankle. and would like to have vitamin b12 and iron checked  . Psoriasis    need s refill on psoriasis cream    HISTORY OF PRESENT ILLNESS: This is a 39 y.o. female complaining of right ankle pain for several weeks.  Denies injury. Has history of psoriasis, requesting refill on psoriasis cream. Has history of anemia secondary to chronic blood loss due to heavy periods.  Presently taking iron and vitamin B12 supplement.  Requesting blood levels. No other complaints or medical concerns today.  HPI   Prior to Admission medications   Medication Sig Start Date End Date Taking? Authorizing Provider  ALPRAZolam (XANAX) 0.5 MG tablet TAKE 1 TABLET(0.5 MG) BY MOUTH DAILY AS NEEDED FOR ANXIETY Patient taking differently: Take 0.5 mg by mouth daily as needed for anxiety.  05/28/19   Horald Pollen, MD  aspirin EC 81 MG tablet Take 81 mg by mouth daily as needed for mild pain.     [provider]  Aspirin-Acetaminophen-Caffeine (GOODYS EXTRA STRENGTH PO) Take 1 packet by mouth as needed.     [provider]  cetirizine (ZYRTEC) 10 MG tablet Take 1 tablet (10 mg total) by mouth daily. Patient taking differently: Take 10 mg by mouth daily as needed for allergies.  07/10/19   Wurst, [place], PA-C  escitalopram (LEXAPRO) 20 MG tablet Take 1 tablet (20 mg total) by mouth daily. 03/13/19 07/16/19  Horald Pollen, MD  famotidine (PEPCID) 40 MG tablet TAKE 1 TABLET(40 MG) BY MOUTH TWICE DAILY 07/17/19   Thornton Park, MD  Ferrous Sulfate (IRON PO) Take 65 mg by mouth daily.    [provider]  megestrol (MEGACE) 40 MG tablet Take 1 tablet (40 mg total) by mouth 3 (three) times daily. Until bleeding stops, then take one tablet daily until you see your gynecologist 07/17/19   Kem Parkinson, PA-C  Multiple Vitamins-Minerals (MULTIVITAMIN WITH  MINERALS) tablet Take 1 tablet by mouth daily.    [provider]  omeprazole (PRILOSEC) 40 MG capsule TAKE 1 CAPSULE(40 MG) BY MOUTH DAILY 07/17/19   Thornton Park, MD    Not on File  Patient Active Problem List   Diagnosis Date Noted  . OSA on CPAP 04/24/2019  . Psoriasis 12/05/2018  . Situational anxiety 12/05/2018  . Intermittent hypertension 12/05/2018    Past Medical History:  Diagnosis Date  . Anxiety   . GERD (gastroesophageal reflux disease)   . High cholesterol   . Psoriasis   . Sterilization consult 01/29/2013   BTL in hosp desired. Papers signed 12/16/12   . SVD (spontaneous vaginal delivery) 02/06/2013    Past Surgical History:  Procedure Laterality Date  . TUBAL LIGATION Bilateral 02/05/2013   Procedure: POST PARTUM TUBAL LIGATION;  Surgeon: Woodroe Mode, MD;  Location: [facility];  Service: Gynecology;  Laterality: Bilateral;    Social History   Socioeconomic History  . Marital status: Legally Separated    Spouse name: Not on file  . Number of children: Not on file  . Years of education: Not on file  . Highest education level: Not on file  Occupational History  . Not on file  Tobacco Use  . Smoking status: Current Some Day Smoker    Packs/day: 0.50    Types: Cigarettes  . Smokeless tobacco: Never Used  Substance and  Sexual Activity  . Alcohol use: No  . Drug use: No  . Sexual activity: Yes    Birth control/protection: Surgical  Other Topics Concern  . Not on file  Social History Narrative  . Not on file   Social Determinants of Health   Financial Resource Strain:   . Difficulty of Paying Living Expenses: Not on file  Food Insecurity:   . Worried About Charity fundraiser in the Last Year: Not on file  . Ran Out of Food in the Last Year: Not on file  Transportation Needs:   . Lack of Transportation (Medical): Not on file  . Lack of Transportation (Non-Medical): Not on file  Physical Activity:   . Days of Exercise per Week: Not on  file  . Minutes of Exercise per Session: Not on file  Stress:   . Feeling of Stress : Not on file  Social Connections:   . Frequency of Communication with Friends and Family: Not on file  . Frequency of Social Gatherings with Friends and Family: Not on file  . Attends Religious Services: Not on file  . Active Member of Clubs or Organizations: Not on file  . Attends Archivist Meetings: Not on file  . Marital Status: Not on file  Intimate Partner Violence:   . Fear of Current or Ex-Partner: Not on file  . Emotionally Abused: Not on file  . Physically Abused: Not on file  . Sexually Abused: Not on file    Family History  Problem Relation Age of Onset  . Diabetes Maternal Grandmother   . Diabetes Paternal Grandmother   . Anxiety disorder Mother   . Heart attack Brother 34       deceased from this  . Anxiety disorder Brother   . Cirrhosis Maternal Grandfather        alcohol related  . Colon cancer Neg Hx   . Esophageal cancer Neg Hx   . Colon polyps Neg Hx   . Stomach cancer Neg Hx   . Pancreatic cancer Neg Hx      Review of Systems  Constitutional: Negative.  Negative for fever.  HENT: Negative.  Negative for congestion and sore throat.   Eyes: Negative.   Respiratory: Negative.  Negative for cough and shortness of breath.   Cardiovascular: Negative.  Negative for chest pain and leg swelling.  Gastrointestinal: Negative.  Negative for abdominal pain, diarrhea, heartburn, nausea and vomiting.  Genitourinary: Negative.   Musculoskeletal: Negative.   Skin: Negative.  Negative for rash.  Neurological: Negative.  Negative for dizziness and headaches.  All other systems reviewed and are negative.  Vitals:   07/30/19 1458  BP: 140/82  Pulse: 87  Temp: (!) 97 F (36.1 C)  SpO2: 97%     Physical Exam Vitals reviewed.  Constitutional:      Appearance: Normal appearance.  HENT:     Head: Normocephalic.  Eyes:     Extraocular Movements: Extraocular  movements intact.     Pupils: Pupils are equal, round, and reactive to light.  Cardiovascular:     Rate and Rhythm: Normal rate.  Pulmonary:     Effort: Pulmonary effort is normal.  Musculoskeletal:     Cervical back: Normal range of motion.     Comments: Right ankle: Positive tenderness to Achilles tendon area with painful range of motion.  Right foot: Neurovascularly intact.  Warm to touch.  Good peripheral pulses. No calf tenderness.  No findings of DVT.  Skin:  General: Skin is warm and dry.     Capillary Refill: Capillary refill takes less than 2 seconds.     Comments: Psoriatic patches to elbows and knees areas  Neurological:     General: No focal deficit present.     Mental Status: She is alert and oriented to person, place, and time.  Psychiatric:        Mood and Affect: Mood normal.        Behavior: Behavior normal.      ASSESSMENT & PLAN: Nyomie was seen today for leg pain and psoriasis.  Diagnoses and all orders for this visit:  Anemia due to chronic blood loss -     Anemia panel  Psoriasis  Generalized anxiety disorder  Achilles tendinitis of right lower extremity -     predniSONE (DELTASONE) 20 MG tablet; Take 2 tablets (40 mg total) by mouth daily with breakfast for 5 days.  OSA on CPAP  Situational anxiety -     ALPRAZolam (XANAX) 0.5 MG tablet; TAKE 1 TABLET(0.5 MG) BY MOUTH DAILY AS NEEDED FOR ANXIETY  Other orders -     augmented betamethasone dipropionate (DIPROLENE AF) 0.05 % cream; Apply topically 2 (two) times daily.    Patient Instructions       If you have lab work done today you will be contacted with your lab results within the next 2 weeks.  If you have not heard from [place] then please contact us. The fastest way to get your results is to register for My Chart.   IF you received an x-ray today, you will receive an invoice from [facility]. Please contact [facility] at [phone] with questions or concerns regarding  your invoice.   IF you received labwork today, you will receive an invoice from [facility]. Please contact [facility] at [phone] with questions or concerns regarding your invoice.   Our billing staff will not be able to assist you with questions regarding bills from these companies.  You will be contacted with the lab results as soon as they are available. The fastest way to get your results is to activate your My Chart account. Instructions are located on the last page of this paperwork. If you have not heard from [place] regarding the results in 2 weeks, please contact this office.      Psoriasis Psoriasis is a long-term (chronic) skin condition. It occurs because your body's defense system (immune system) causes skin cells to form too quickly. This causes raised, red patches (plaques) on your skin that look silvery. The patches may be on all areas of your body. They can be any size or shape. Psoriasis can come and go. It can range from mild to very bad. It cannot be passed from one person to another (is not contagious). There is no cure for this condition, but it can be helped with treatment. What are the causes? The cause of psoriasis is not known. Some things can make it worse. These are:  Skin damage, such as cuts, scrapes, sunburn, and dryness.  Not getting enough sunlight.  Some medicines.  Alcohol.  Tobacco.  Stress.  Infections. What increases the risk?  Having a family member with psoriasis.  Being very overweight (obese).  Being 21-80 years old.  Taking certain medicines. What are the signs or symptoms? There are different types of psoriasis. The types are:  Plaque. This is the most common. Symptoms include red, raised patches with a silvery coating. These may be itchy. Your nails may  be crumbly or fall off.  Guttate. Symptoms include small red spots on your stomach area, arms, and legs. These may happen after you have been sick, such as with strep  throat.  Inverse. Symptoms include patches in your armpits, under your breasts, private areas, or on your butt.  Pustular. Symptoms include pus-filled bumps on the palms of your hands or the soles of your feet. You also may feel very tired, weak, have a fever, and not be hungry.  Erythrodermic. Symptoms include bright red skin that looks burned. You may have a fast heartbeat and a body temperature that is too high or too low. You may be itchy or in pain.  Sebopsoriasis. Symptoms include red patches on your scalp, forehead, and face that are greasy.  Psoriatic arthritis. Symptoms include swollen, painful joints along with scaly skin patches. How is this treated? There is no cure for this condition, but treatment can:  Help your skin heal.  Lessen itching and irritation and swelling (inflammation).  Slow the growth of new skin cells.  Help your body's defense system respond better to your skin. Treatment may include:  Creams or ointments.  Light therapy. This may include natural sunlight or light therapy in a doctor's office.  Medicines. These can help your body better manage skin cells. They may be used with light therapy or ointments. Medicines may include pills or injections. You may also get antibiotic medicines if you have an infection. Follow these instructions at home: Skin Care  Apply lotion to your skin as needed. Only use those that your doctor has said are okay.  Apply cool, wet cloths (cold compresses) to the affected areas.  Do not use a hot tub or take hot showers. Use slightly warm, not hot, water when taking showers and baths.  Do not scratch your skin. Lifestyle   Do not use any products that contain nicotine or tobacco, such as cigarettes, e-cigarettes, and chewing tobacco. If you need help quitting, ask your doctor.  Lower your stress.  Keep a healthy weight.  Go out in the sun as told by your doctor. Do not get sunburned.  Join a support  group. Medicines  Take or use over-the-counter and prescription medicines only as told by your doctor.  If you were prescribed an antibiotic medicine, take it as told by your doctor. Do not stop using the antibiotic even if you start to feel better. Alcohol use If you drink alcohol:  Limit how much you use: ? 0-1 drink a day for women. ? 0-2 drinks a day for men.  Be aware of how much alcohol is in your drink. In the [place] one drink equals one 12 oz bottle of beer (355 mL), one 5 oz glass of wine (148 mL), or one 1 oz glass of hard liquor (44 mL). General instructions  Keep a journal to track the things that cause symptoms (triggers). Try to avoid these things.  See a counselor if you feel the support would help.  Keep all follow-up visits as told by your doctor. This is important. Contact a doctor if:  You have a fever.  Your pain gets worse.  You have more redness or warmth in the affected areas.  You have new or worse pain or stiffness in your joints.  Your nails start to break easily or pull away from the nail bed.  You feel very sad (depressed). Summary  Psoriasis is a long-term (chronic) skin condition.  There is no cure for this condition,  but treatment can help manage it.  Keep a journal to track the things that cause symptoms.  Take or use over-the-counter and prescription medicines only as told by your doctor.  Keep all follow-up visits as told by your doctor. This is important. This information is not intended to replace advice given to you by your health care provider. Make sure you discuss any questions you have with your health care provider. Document Revised: 04/30/2018 Document Reviewed: 04/30/2018 Elsevier Patient Education  [place].  Anemia  Anemia is a condition in which you do not have enough red blood cells or hemoglobin. Hemoglobin is a substance in red blood cells that carries oxygen. When you do not have enough red blood cells or  hemoglobin (are anemic), your body cannot get enough oxygen and your organs may not work properly. As a result, you may feel very tired or have other problems. What are the causes? Common causes of anemia include:  Excessive bleeding. Anemia can be caused by excessive bleeding inside or outside the body, including bleeding from the intestine or from periods in women.  Poor nutrition.  Long-lasting (chronic) kidney, thyroid, and liver disease.  Bone marrow disorders.  Cancer and treatments for cancer.  HIV (human immunodeficiency virus) and AIDS (acquired immunodeficiency syndrome).  Treatments for HIV and AIDS.  Spleen problems.  Blood disorders.  Infections, medicines, and autoimmune disorders that destroy red blood cells. What are the signs or symptoms? Symptoms of this condition include:  Minor weakness.  Dizziness.  Headache.  Feeling heartbeats that are irregular or faster than normal (palpitations).  Shortness of breath, especially with exercise.  Paleness.  Cold sensitivity.  Indigestion.  Nausea.  Difficulty sleeping.  Difficulty concentrating. Symptoms may occur suddenly or develop slowly. If your anemia is mild, you may not have symptoms. How is this diagnosed? This condition is diagnosed based on:  Blood tests.  Your medical history.  A physical exam.  Bone marrow biopsy. Your health care provider may also check your stool (feces) for blood and may do additional testing to look for the cause of your bleeding. You may also have other tests, including:  Imaging tests, such as a CT scan or MRI.  Endoscopy.  Colonoscopy. How is this treated? Treatment for this condition depends on the cause. If you continue to lose a lot of blood, you may need to be treated at a hospital. Treatment may include:  Taking supplements of iron, vitamin Q11, or folic acid.  Taking a hormone medicine (erythropoietin) that can help to stimulate red blood cell  growth.  Having a blood transfusion. This may be needed if you lose a lot of blood.  Making changes to your diet.  Having surgery to remove your spleen. Follow these instructions at home:  Take over-the-counter and prescription medicines only as told by your health care provider.  Take supplements only as told by your health care provider.  Follow any diet instructions that you were given.  Keep all follow-up visits as told by your health care provider. This is important. Contact a health care provider if:  You develop new bleeding anywhere in the body. Get help right away if:  You are very weak.  You are short of breath.  You have pain in your abdomen or chest.  You are dizzy or feel faint.  You have trouble concentrating.  You have bloody or black, tarry stools.  You vomit repeatedly or you vomit up blood. Summary  Anemia is a condition in  which you do not have enough red blood cells or enough of a substance in your red blood cells that carries oxygen (hemoglobin).  Symptoms may occur suddenly or develop slowly.  If your anemia is mild, you may not have symptoms.  This condition is diagnosed with blood tests as well as a medical history and physical exam. Other tests may be needed.  Treatment for this condition depends on the cause of the anemia. This information is not intended to replace advice given to you by your health care provider. Make sure you discuss any questions you have with your health care provider. Document Revised: 06/08/2017 Document Reviewed: 07/28/2016 Elsevier Patient Education  2020 Elsevier Inc.      Agustina Caroli, MD [facility]

## 2019-07-31 ENCOUNTER — Encounter: Payer: Self-pay | Admitting: Emergency Medicine

## 2019-07-31 LAB — ANEMIA PANEL
Ferritin: 27 ng/mL (ref 15–150)
Folate, Hemolysate: 477 ng/mL
Folate, RBC: 1375 ng/mL (ref >498)
Hematocrit: 34.7 % (ref 34.0–46.6)
Iron Saturation: 20 % (ref 15–55)
Iron: 90 ug/dL (ref 27–159)
Retic Ct Pct: 5.9 % — ABNORMAL HIGH (ref 0.6–2.6)
Total Iron Binding Capacity: 444 ug/dL (ref 250–450)
UIBC: 354 ug/dL (ref 131–425)
Vitamin B-12: 657 pg/mL (ref 232–1245)

## 2019-08-05 ENCOUNTER — Emergency Department (HOSPITAL_COMMUNITY)
Admission: EM | Admit: 2019-08-05 | Discharge: 2019-08-06 | Disposition: A | Discharge status: Home / Self Care | Payer: 59 | Attending: Emergency Medicine | Admitting: Emergency Medicine

## 2019-08-05 ENCOUNTER — Emergency Department (HOSPITAL_COMMUNITY): Payer: 59

## 2019-08-05 ENCOUNTER — Ambulatory Visit: Payer: 59 | Admitting: Women's Health

## 2019-08-05 ENCOUNTER — Encounter: Payer: Self-pay | Admitting: Women's Health

## 2019-08-05 ENCOUNTER — Other Ambulatory Visit: Payer: Self-pay

## 2019-08-05 ENCOUNTER — Encounter (HOSPITAL_COMMUNITY): Payer: Self-pay | Admitting: *Deleted

## 2019-08-05 DIAGNOSIS — F17210 Nicotine dependence, cigarettes, uncomplicated: Secondary | ICD-10-CM | POA: Diagnosis not present

## 2019-08-05 DIAGNOSIS — N920 Excessive and frequent menstruation with regular cycle: Secondary | ICD-10-CM | POA: Diagnosis not present

## 2019-08-05 DIAGNOSIS — R0602 Shortness of breath: Secondary | ICD-10-CM | POA: Insufficient documentation

## 2019-08-05 DIAGNOSIS — K219 Gastro-esophageal reflux disease without esophagitis: Secondary | ICD-10-CM | POA: Diagnosis not present

## 2019-08-05 DIAGNOSIS — R0789 Other chest pain: Secondary | ICD-10-CM | POA: Insufficient documentation

## 2019-08-05 DIAGNOSIS — R079 Chest pain, unspecified: Secondary | ICD-10-CM

## 2019-08-05 DIAGNOSIS — Z01419 Encounter for gynecological examination (general) (routine) without abnormal findings: Secondary | ICD-10-CM

## 2019-08-05 LAB — CBC
HCT: 37.2 % (ref 36.0–46.0)
Hemoglobin: 11 g/dL — ABNORMAL LOW (ref 12.0–15.0)
MCH: 24.5 pg — ABNORMAL LOW (ref 26.0–34.0)
MCHC: 29.6 g/dL — ABNORMAL LOW (ref 30.0–36.0)
MCV: 82.9 fL (ref 80.0–100.0)
Platelets: 482 10*3/uL — ABNORMAL HIGH (ref 150–400)
RBC: 4.49 MIL/uL (ref 3.87–5.11)
RDW: 21.1 % — ABNORMAL HIGH (ref 11.5–15.5)
WBC: 16.2 10*3/uL — ABNORMAL HIGH (ref 4.0–10.5)
nRBC: 0 % (ref 0.0–0.2)

## 2019-08-05 LAB — BASIC METABOLIC PANEL
Anion gap: 9 (ref 5–15)
BUN: 14 mg/dL (ref 6–20)
CO2: 21 mmol/L — ABNORMAL LOW (ref 22–32)
Calcium: 8.8 mg/dL — ABNORMAL LOW (ref 8.9–10.3)
Chloride: 107 mmol/L (ref 98–111)
Creatinine, Ser: 0.79 mg/dL (ref 0.44–1.00)
GFR calc Af Amer: >60 mL/min (ref >60)
GFR calc non Af Amer: >60 mL/min (ref >60)
Glucose, Bld: 89 mg/dL (ref 70–99)
Potassium: 3.6 mmol/L (ref 3.5–5.1)
Sodium: 137 mmol/L (ref 135–145)

## 2019-08-05 LAB — POC URINE PREG, ED: Preg Test, Ur: NEGATIVE

## 2019-08-05 LAB — D-DIMER, QUANTITATIVE: D-Dimer, Quant: 0.42 ug/mL-FEU (ref 0.00–0.50)

## 2019-08-05 LAB — TROPONIN I (HIGH SENSITIVITY): Troponin I (High Sensitivity): <2 ng/L (ref <18)

## 2019-08-05 MED ORDER — SODIUM CHLORIDE 0.9% FLUSH: 3.0000 mL | Freq: Once | INTRAVENOUS | Status: DC

## 2019-08-05 NOTE — Progress Notes (Signed)
Theresa Sawyer [birth year] [id number]    History:    Presents for annual exam.  Monthly 5-6 day cycle with menorrhagia changing protection every 1-2 hours first 4 days.  BTL.  Reports normal Pap history last Pap 2015.  Was seen at ER on 07/16/2019 for DU B, passing large clots with extremely heavy flow and was given Megace which has stopped the bleeding..  Long-term history of menorrhagia/anemia but states cycles are getting heavier.  Primary care manages GERD and anxiety.  Same partner 10 years.  Psoriasis  Past medical history, past surgical history, family history and social history were all reviewed and documented in the EPIC chart.  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  Numerous family members with hypertension.  ROS:  A ROS was performed and pertinent positives and negatives are included.  Exam:  Vitals:   08/05/19 1013  BP: 130/82  Weight: 238 lb 12.8 oz (108.3 kg)  Height: 5' 6"  (1.676 m)   Body mass index is 38.54 kg/m.   General appearance:  Normal Thyroid:  Symmetrical, normal in size, without palpable masses or nodularity. Respiratory  Auscultation:  Clear without wheezing or rhonchi Cardiovascular  Auscultation:  Regular rate, without rubs, murmurs or gallops  Edema/varicosities:  Not grossly evident Abdominal  Soft,nontender, without masses, guarding or rebound.  Liver/spleen:  No organomegaly noted  Hernia:  None appreciated  Skin  Inspection:  Grossly normal   Breasts: Examined lying and sitting.     Right: Without masses, retractions, discharge or axillary adenopathy.     Left: Without masses, retractions, discharge or axillary adenopathy. Gentitourinary   Inguinal/mons:  Normal without inguinal adenopathy  External genitalia:  Normal  BUS/Urethra/Skene's glands:  Normal  Vagina:  Normal  Cervix:  Normal  Uterus: Difficult to palpate size/girth nontender normal in size, shape and contour.  Midline and mobile  Adnexa/parametria:     Rt: Without masses or  tenderness.   Lt: Without masses or tenderness.  Anus and perineum: Normal  Digital rectal exam: Normal sphincter tone without palpated masses or tenderness  Assessment/Plan:  39 y.o. S WF G3, P1 for annual exam with complaint of heavy cycles.  Monthly 5 to 6-day cycles with menorrhagia/BTL/anemia GERD , anxiety, anemia-primary care manages labs and meds Obesity Psoriasis, arms, legs-numerous patches-dermatologist  Plan: Schedule sonohysterogram with Dr. Delilah Shan to rule out defect, endometrial ablation discussed as possible treatment.  Continue on Megace until sonohysterogram.  Aware of importance of weight loss, daily exercise.  Encouraged dark green leafy vegetables/iron rich foods, continue iron supplement.  SBEs, start annual screening mammograms.  Pap with HR HPV typing, new screening guidelines reviewed.  TSH pending.    [place], 1:26 PM 08/05/2019

## 2019-08-05 NOTE — Patient Instructions (Addendum)
Carbohydrate Counting for Diabetes Mellitus, Adult  Carbohydrate counting is a method of keeping track of how many carbohydrates you eat. Eating carbohydrates naturally increases the amount of sugar (glucose) in the blood. Counting how many carbohydrates you eat helps keep your blood glucose within normal limits, which helps you manage your diabetes (diabetes mellitus). It is important to know how many carbohydrates you can safely have in each meal. This is different for every person. A diet and nutrition specialist (registered dietitian) can help you make a meal plan and calculate how many carbohydrates you should have at each meal and snack. Carbohydrates are found in the following foods:  Grains, such as breads and cereals.  Dried beans and soy products.  Starchy vegetables, such as potatoes, peas, and corn.  Fruit and fruit juices.  Milk and yogurt.  Sweets and snack foods, such as cake, cookies, candy, chips, and soft drinks. How do I count carbohydrates? There are two ways to count carbohydrates in food. You can use either of the methods or a combination of both. Reading "Nutrition Facts" on packaged food The "Nutrition Facts" list is included on the labels of almost all packaged foods and beverages in the [place] It includes:  The serving size.  Information about nutrients in each serving, including the grams (g) of carbohydrate per serving. To use the "Nutrition Facts":  Decide how many servings you will have.  Multiply the number of servings by the number of carbohydrates per serving.  The resulting number is the total amount of carbohydrates that you will be having. Learning standard serving sizes of other foods When you eat carbohydrate foods that are not packaged or do not include "Nutrition Facts" on the label, you need to measure the servings in order to count the amount of carbohydrates:  Measure the foods that you will eat with a food scale or measuring cup, if needed.   Decide how many standard-size servings you will eat.  Multiply the number of servings by 15. Most carbohydrate-rich foods have about 15 g of carbohydrates per serving. ? For example, if you eat 8 oz (170 g) of strawberries, you will have eaten 2 servings and 30 g of carbohydrates (2 servings x 15 g = 30 g).  For foods that have more than one food mixed, such as soups and casseroles, you must count the carbohydrates in each food that is included. The following list contains standard serving sizes of common carbohydrate-rich foods. Each of these servings has about 15 g of carbohydrates:   hamburger bun or  English muffin.   oz (15 mL) syrup.   oz (14 g) jelly.  1 slice of bread.  1 six-inch tortilla.  3 oz (85 g) cooked rice or pasta.  4 oz (113 g) cooked dried beans.  4 oz (113 g) starchy vegetable, such as peas, corn, or potatoes.  4 oz (113 g) hot cereal.  4 oz (113 g) mashed potatoes or  of a large baked potato.  4 oz (113 g) canned or frozen fruit.  4 oz (120 mL) fruit juice.  4-6 crackers.  6 chicken nuggets.  6 oz (170 g) unsweetened dry cereal.  6 oz (170 g) plain fat-free yogurt or yogurt sweetened with artificial sweeteners.  8 oz (240 mL) milk.  8 oz (170 g) fresh fruit or one small piece of fruit.  24 oz (680 g) popped popcorn. Example of carbohydrate counting Sample meal  3 oz (85 g) chicken breast.  6 oz (170 g)  brown rice.  4 oz (113 g) corn.  8 oz (240 mL) milk.  8 oz (170 g) strawberries with sugar-free whipped topping. Carbohydrate calculation 1. Identify the foods that contain carbohydrates: ? Rice. ? Corn. ? Milk. ? Strawberries. 2. Calculate how many servings you have of each food: ? 2 servings rice. ? 1 serving corn. ? 1 serving milk. ? 1 serving strawberries. 3. Multiply each number of servings by 15 g: ? 2 servings rice x 15 g = 30 g. ? 1 serving corn x 15 g = 15 g. ? 1 serving milk x 15 g = 15 g. ? 1 serving  strawberries x 15 g = 15 g. 4. Add together all of the amounts to find the total grams of carbohydrates eaten: ? 30 g + 15 g + 15 g + 15 g = 75 g of carbohydrates total. Summary  Carbohydrate counting is a method of keeping track of how many carbohydrates you eat.  Eating carbohydrates naturally increases the amount of sugar (glucose) in the blood.  Counting how many carbohydrates you eat helps keep your blood glucose within normal limits, which helps you manage your diabetes.  A diet and nutrition specialist (registered dietitian) can help you make a meal plan and calculate how many carbohydrates you should have at each meal and snack. This information is not intended to replace advice given to you by your health care provider. Make sure you discuss any questions you have with your health care provider. Document Revised: 01/18/2017 Document Reviewed: 12/08/2015 Elsevier Patient Education  2020 [place]. Endometrial Ablation Endometrial ablation is a procedure that destroys the thin inner layer of the lining of the uterus (endometrium). This procedure may be done:  To stop heavy periods.  To stop bleeding that is causing anemia.  To control irregular bleeding.  To treat bleeding caused by small tumors (fibroids) in the endometrium. This procedure is often an alternative to major surgery, such as removal of the uterus and cervix (hysterectomy). As a result of this procedure:  You may not be able to have children. However, if you are premenopausal (you have not gone through menopause): ? You may still have a small chance of getting pregnant. ? You will need to use a reliable method of birth control after the procedure to prevent pregnancy.  You may stop having a menstrual period, or you may have only a small amount of bleeding during your period. Menstruation may return several years after the procedure. Tell a health care provider about:  Any allergies you have.  All medicines  you are taking, including vitamins, herbs, eye drops, creams, and over-the-counter medicines.  Any problems you or family members have had with the use of anesthetic medicines.  Any blood disorders you have.  Any surgeries you have had.  Any medical conditions you have. What are the risks? Generally, this is a safe procedure. However, problems may occur, including:  A hole (perforation) in the uterus or bowel.  Infection of the uterus, bladder, or vagina.  Bleeding.  Damage to other structures or organs.  An air bubble in the lung (air embolus).  Problems with pregnancy after the procedure.  Failure of the procedure.  Decreased ability to diagnose cancer in the endometrium. What happens before the procedure?  You will have tests of your endometrium to make sure there are no pre-cancerous cells or cancer cells present.  You may have an ultrasound of the uterus.  You may be given medicines to thin  the endometrium.  Ask your health care provider about: ? Changing or stopping your regular medicines. This is especially important if you take diabetes medicines or blood thinners. ? Taking medicines such as aspirin and ibuprofen. These medicines can thin your blood. Do not take these medicines before your procedure if your doctor tells you not to.  Plan to have someone take you home from the hospital or clinic. What happens during the procedure?   You will lie on an exam table with your feet and legs supported as in a pelvic exam.  To lower your risk of infection: ? Your health care team will wash or sanitize their hands and put on germ-free (sterile) gloves. ? Your genital area will be washed with soap.  An IV tube will be inserted into one of your veins.  You will be given a medicine to help you relax (sedative).  A surgical instrument with a light and camera (resectoscope) will be inserted into your vagina and moved into your uterus. This allows your surgeon to see  inside your uterus.  Endometrial tissue will be removed using one of the following methods: ? Radiofrequency. This method uses a radiofrequency-alternating electric current to remove the endometrium. ? Cryotherapy. This method uses extreme cold to freeze the endometrium. ? Heated-free liquid. This method uses a heated saltwater (saline) solution to remove the endometrium. ? Microwave. This method uses high-energy microwaves to heat up the endometrium and remove it. ? Thermal balloon. This method involves inserting a catheter with a balloon tip into the uterus. The balloon tip is filled with heated fluid to remove the endometrium. The procedure may vary among health care providers and hospitals. What happens after the procedure?  Your blood pressure, heart rate, breathing rate, and blood oxygen level will be monitored until the medicines you were given have worn off.  As tissue healing occurs, you may notice vaginal bleeding for 4-6 weeks after the procedure. You may also experience: ? Cramps. ? Thin, watery vaginal discharge that is light pink or brown in color. ? A need to urinate more frequently than usual. ? Nausea.  Do not drive for 24 hours if you were given a sedative.  Do not have sex or insert anything into your vagina until your health care provider approves. Summary  Endometrial ablation is done to treat the many causes of heavy menstrual bleeding.  The procedure may be done only after medications have been tried to control the bleeding.  Plan to have someone take you home from the hospital or clinic. This information is not intended to replace advice given to you by your health care provider. Make sure you discuss any questions you have with your health care provider. Document Revised: 12/11/2017 Document Reviewed: 07/13/2016 Elsevier Patient Education  [place]. Menorrhagia  Menorrhagia is a condition in which menstrual periods are heavy or last longer than  normal. With menorrhagia, most periods a woman has may cause enough blood loss and cramping that she becomes unable to take part in her usual activities. What are the causes? Common causes of this condition include:  Noncancerous growths in the uterus (polyps or fibroids).  An imbalance of the estrogen and progesterone hormones.  One of the ovaries not releasing an egg during one or more months.  A problem with the thyroid gland (hypothyroid).  Side effects of having an intrauterine device (IUD).  Side effects of some medicines, such as anti-inflammatory medicines or blood thinners.  A bleeding disorder that stops the  blood from clotting normally. In some cases, the cause of this condition is not known. What are the signs or symptoms? Symptoms of this condition include:  Routinely having to change your pad or tampon every 1-2 hours because it is completely soaked.  Needing to use pads and tampons at the same time because of heavy bleeding.  Needing to wake up to change your pads or tampons during the night.  Passing blood clots larger than 1 inch (2.5 cm) in size.  Having bleeding that lasts for more than 7 days.  Having symptoms of low iron levels (anemia), such as tiredness, fatigue, or shortness of breath. How is this diagnosed? This condition may be diagnosed based on:  A physical exam.  Your symptoms and menstrual history.  Tests, such as: ? Blood tests to check if you are pregnant or have hormonal changes, a bleeding or thyroid disorder, anemia, or other problems. ? Pap test to check for cancerous changes, infections, or inflammation. ? Endometrial biopsy. This test involves removing a tissue sample from the lining of the uterus (endometrium) to be examined under a microscope. ? Pelvic ultrasound. This test uses sound waves to create images of your uterus, ovaries, and vagina. The images can show if you have fibroids or other growths. ? Hysteroscopy. For this test, a  small telescope is used to look inside your uterus. How is this treated? Treatment may not be needed for this condition. If it is needed, the best treatment for you will depend on:  Whether you need to prevent pregnancy.  Your desire to have children in the future.  The cause and severity of your bleeding.  Your personal preference. Medicines are the first step in treatment. You may be treated with:  Hormonal birth control methods. These treatments reduce bleeding during your menstrual period. They include: ? Birth control pills. ? Skin patch. ? Vaginal ring. ? Shots (injections) that you get every 3 months. ? Hormonal IUD (intrauterine device). ? Implants that go under the skin.  Medicines that thicken blood and slow bleeding.  Medicines that reduce swelling, such as ibuprofen.  Medicines that contain an artificial (synthetic) hormone called progestin.  Medicines that make the ovaries stop working for a short time.  Iron supplements to treat anemia. If medicines do not work, surgery may be done. Surgical options may include:  Dilation and curettage (D&C). In this procedure, your health care provider opens (dilates) your cervix and then scrapes or suctions tissue from the endometrium to reduce menstrual bleeding.  Operative hysteroscopy. In this procedure, a small tube with a light on the end (hysteroscope) is used to view your uterus and help remove polyps that may be causing heavy periods.  Endometrial ablation. This is when various techniques are used to permanently destroy your entire endometrium. After endometrial ablation, most women have little or no menstrual flow. This procedure reduces your ability to become pregnant.  Endometrial resection. In this procedure, an electrosurgical wire loop is used to remove the endometrium. This procedure reduces your ability to become pregnant.  Hysterectomy. This is surgical removal of the uterus. This is a permanent procedure that  stops menstrual periods. Pregnancy is not possible after a hysterectomy. Follow these instructions at home: Medicines  Take over-the-counter and prescription medicines exactly as told by your health care provider. This includes iron pills.  Do not change or switch medicines without asking your health care provider.  Do not take aspirin or medicines that contain aspirin 1 week before or during  your menstrual period. Aspirin may make bleeding worse. General instructions  If you need to change your sanitary pad or tampon more than once every 2 hours, limit your activity until the bleeding stops.  Iron pills can cause constipation. To prevent or treat constipation while you are taking prescription iron supplements, your health care provider may recommend that you: ? Drink enough fluid to keep your urine clear or pale yellow. ? Take over-the-counter or prescription medicines. ? Eat foods that are high in fiber, such as fresh fruits and vegetables, whole grains, and beans. ? Limit foods that are high in fat and processed sugars, such as fried and sweet foods.  Eat well-balanced meals, including foods that are high in iron. Foods that have a lot of iron include leafy green vegetables, meat, liver, eggs, and whole grain breads and cereals.  Do not try to lose weight until the abnormal bleeding has stopped and your blood iron level is back to normal. If you need to lose weight, work with your health care provider to lose weight safely.  Keep all follow-up visits as told by your health care provider. This is important. Contact a health care provider if:  You soak through a pad or tampon every 1 or 2 hours, and this happens every time you have a period.  You need to use pads and tampons at the same time because you are bleeding so much.  You have nausea, vomiting, diarrhea, or other problems related to medicines you are taking. Get help right away if:  You soak through more than a pad or tampon  in 1 hour.  You pass clots bigger than 1 inch (2.5 cm) wide.  You feel short of breath.  You feel like your heart is beating too fast.  You feel dizzy or faint.  You feel very weak or tired. Summary  Menorrhagia is a condition in which menstrual periods are heavy or last longer than normal.  Treatment will depend on the cause of the condition and may include medicines or procedures.  Take over-the-counter and prescription medicines exactly as told by your health care provider. This includes iron pills.  Get help right away if you have heavy bleeding that soaks through more than a pad or tampon in 1 hour, you are passing large clots, or you feel dizzy, faint or short of breath. This information is not intended to replace advice given to you by your health care provider. Make sure you discuss any questions you have with your health care provider. Document Revised: 10/03/2017 Document Reviewed: 06/19/2016 Elsevier Patient Education  [place].

## 2019-08-05 NOTE — ED Triage Notes (Signed)
Pt c/o chest pain and sob that started a couple of days ago

## 2019-08-05 NOTE — ED Provider Notes (Signed)
[facility] EMERGENCY DEPARTMENT Provider Note   CSN: [id number] Arrival date & time: 08/05/19  1913     History Chief Complaint  Patient presents with  . Chest Pain    Theresa Sawyer is a 39 y.o. female.  Patient presents for evaluation of left sided chest pain onset around 1700 today. Pain is located in left upper anterior chest and along the lateral posterior chest. Pain is described as a tightness. She endorses mild shortness of breath for two days.  Denies fever/chills, abdominal pain, nausea or vomiting.  The history is provided by the patient.  Chest Pain Pain location:  L chest and L lateral chest Pain severity:  Moderate Onset quality:  Sudden Duration:  5 hours Timing:  Intermittent Progression:  Waxing and waning Chronicity:  New Associated symptoms: shortness of breath   Associated symptoms: no abdominal pain, no cough, no fever, no nausea, no palpitations and no vomiting   Risk factors: no prior DVT/PE        Past Medical History:  Diagnosis Date  . Anxiety   . GERD (gastroesophageal reflux disease)   . High cholesterol   . Psoriasis   . Sterilization consult 01/29/2013   BTL in hosp desired. Papers signed 12/16/12   . SVD (spontaneous vaginal delivery) 02/06/2013    Patient Active Problem List   Diagnosis Date Noted  . OSA on CPAP 04/24/2019  . Psoriasis 12/05/2018  . Situational anxiety 12/05/2018  . Intermittent hypertension 12/05/2018    Past Surgical History:  Procedure Laterality Date  . TUBAL LIGATION Bilateral 02/05/2013   Procedure: POST PARTUM TUBAL LIGATION;  Surgeon: Woodroe Mode, MD;  Location: [facility];  Service: Gynecology;  Laterality: Bilateral;     OB History    Gravida  3   Para  1   Term  1   Preterm  0   AB  2   Living  1     SAB  1   TAB  1   Ectopic  0   Multiple  0   Live Births  1           Family History  Problem Relation Age of Onset  . Diabetes Maternal Grandmother   . Diabetes Paternal  Grandmother   . Anxiety disorder Mother   . Heart attack Brother 34       deceased from this  . Anxiety disorder Brother   . Cirrhosis Maternal Grandfather        alcohol related  . Colon cancer Neg Hx   . Esophageal cancer Neg Hx   . Colon polyps Neg Hx   . Stomach cancer Neg Hx   . Pancreatic cancer Neg Hx     Social History   Tobacco Use  . Smoking status: Current Some Day Smoker    Packs/day: 0.50    Types: Cigarettes  . Smokeless tobacco: Never Used  Substance Use Topics  . Alcohol use: No  . Drug use: No    Home Medications Prior to Admission medications   Medication Sig Start Date End Date Taking? Authorizing Provider  ALPRAZolam Duanne Moron) 0.5 MG tablet TAKE 1 TABLET(0.5 MG) BY MOUTH DAILY AS NEEDED FOR ANXIETY 07/30/19   Horald Pollen, MD  aspirin EC 81 MG tablet Take 81 mg by mouth daily as needed for mild pain.     [provider]  Aspirin-Acetaminophen-Caffeine (GOODYS EXTRA STRENGTH PO) Take 1 packet by mouth as needed.     [provider]  augmented betamethasone dipropionate (DIPROLENE AF) 0.05 % cream Apply topically 2 (two) times daily. 07/30/19   Horald Pollen, MD  cetirizine (ZYRTEC) 10 MG tablet Take 1 tablet (10 mg total) by mouth daily. Patient taking differently: Take 10 mg by mouth daily as needed for allergies.  07/10/19   Wurst, [place], PA-C  escitalopram (LEXAPRO) 20 MG tablet Take 1 tablet (20 mg total) by mouth daily. 03/13/19 07/16/19  Horald Pollen, MD  famotidine (PEPCID) 40 MG tablet TAKE 1 TABLET(40 MG) BY MOUTH TWICE DAILY 07/17/19   Thornton Park, MD  Ferrous Sulfate (IRON PO) Take 65 mg by mouth daily.    [provider]  megestrol (MEGACE) 40 MG tablet Take 1 tablet (40 mg total) by mouth 3 (three) times daily. Until bleeding stops, then take one tablet daily until you see your gynecologist 07/17/19   Kem Parkinson, PA-C  Multiple Vitamins-Minerals (MULTIVITAMIN WITH MINERALS) tablet Take 1  tablet by mouth daily.    [provider]  omeprazole (PRILOSEC) 40 MG capsule TAKE 1 CAPSULE(40 MG) BY MOUTH DAILY 07/17/19   Thornton Park, MD    Allergies    Patient has no known allergies.  Review of Systems   Review of Systems  Constitutional: Negative for fever.  Respiratory: Positive for shortness of breath. Negative for cough.   Cardiovascular: Positive for chest pain. Negative for palpitations.  Gastrointestinal: Negative for abdominal pain, nausea and vomiting.  All other systems reviewed and are negative.   Physical Exam Updated Vital Signs BP (!) 150/72 (BP Location: Right Arm)   Pulse 74   Temp 98.5 F (36.9 C) (Oral)   Resp 20   Ht 5' 5"  (1.651 m)   Wt 108 kg   LMP 07/09/2019   SpO2 96%   BMI 39.61 kg/m   Physical Exam Constitutional:      General: She is not in acute distress.    Appearance: She is well-developed.  HENT:     Head: Normocephalic.  Eyes:     Conjunctiva/sclera: Conjunctivae normal.  Cardiovascular:     Rate and Rhythm: Normal rate and regular rhythm.  Pulmonary:     Breath sounds: Normal breath sounds.  Abdominal:     Palpations: Abdomen is soft.  Musculoskeletal:        General: Normal range of motion.     Right lower leg: Tenderness present. No edema.     Left lower leg: No edema.     Comments: No appreciable edema of lower extremities, mild calf discomfort on right with flexion of foot.  Skin:    General: Skin is warm and dry.  Neurological:     Mental Status: She is alert and oriented to person, place, and time.  Psychiatric:        Mood and Affect: Mood normal.     ED Results / Procedures / Treatments   Labs (all labs ordered are listed, but only abnormal results are displayed) Labs Reviewed  BASIC METABOLIC PANEL - Abnormal; Notable for the following components:      Result Value   CO2 21 (*)    Calcium 8.8 (*)    All other components within normal limits  CBC - Abnormal; Notable for the following  components:   WBC 16.2 (*)    Hemoglobin 11.0 (*)    MCH 24.5 (*)    MCHC 29.6 (*)    RDW 21.1 (*)    Platelets 482 (*)    All other components within normal limits  D-DIMER, QUANTITATIVE (NOT AT [facility])  POC URINE PREG, ED  TROPONIN I (HIGH SENSITIVITY)  TROPONIN I (HIGH SENSITIVITY)    EKG EKG Interpretation  Date/Time:  Tuesday August 05 2019 20:05:50 EST Ventricular Rate:  73 PR Interval:  132 QRS Duration: 84 QT Interval:  380 QTC Calculation: 418 R Axis:   4 Text Interpretation: Normal sinus rhythm Normal ECG Confirmed by Fredia Sorrow [phone]) on 08/05/2019 8:13:03 PM   Radiology DG Chest 2 View  Result Date: 08/05/2019 CLINICAL DATA:  Chest pain and shortness of breath EXAM: CHEST - 2 VIEW COMPARISON:  March 05, 2018 FINDINGS: The lungs are clear. Heart size and pulmonary vascularity are normal. No adenopathy. No pneumothorax. No bone lesions. IMPRESSION: No abnormality noted. Electronically Signed   By: Lowella Grip III M.D.   On: 08/05/2019 20:20    Procedures Procedures (including critical care time)  Medications Ordered in ED Medications  sodium chloride flush (NS) 0.9 % injection 3 mL (has no administration in time range)    ED Course  I have reviewed the triage vital signs and the nursing notes.  Pertinent labs & imaging results that were available during my care of the patient were reviewed by me and considered in my medical decision making (see chart for details).    MDM Rules/Calculators/A&P                      Patient is to be discharged with recommendation to follow up with PCP in regards to today's hospital visit. Chest pain is not likely of cardiac or pulmonary etiology d/t presentation, perc negative, VSS, no tracheal deviation, no JVD or new murmur, RRR, breath sounds equal bilaterally, EKG without acute abnormalities, negative troponin, and negative CXR. Pt has been advised to return to the ED is CP becomes exertional, associated with  diaphoresis or nausea, radiates to left jaw/arm, worsens or becomes concerning in any way. Pt appears reliable for follow up and is agreeable to discharge.    Final Clinical Impression(s) / ED Diagnoses Final diagnoses:  Nonspecific chest pain    Rx / DC Orders ED Discharge Orders    None       Etta Quill, NP 08/06/19 0040    Fredia Sorrow, MD 08/12/19 [phone]

## 2019-08-06 ENCOUNTER — Encounter: Payer: Self-pay | Admitting: Neurology

## 2019-08-06 LAB — TROPONIN I (HIGH SENSITIVITY): Troponin I (High Sensitivity): <2 ng/L (ref <18)

## 2019-08-06 LAB — TSH: TSH: 1.89 mIU/L

## 2019-08-06 NOTE — ED Notes (Signed)
Pt unhooked from monitor to go to bathroom; pt asking how much longer; informed her the doctor is waiting on one more lab result; pt verbalized understanding

## 2019-08-07 ENCOUNTER — Other Ambulatory Visit: Payer: Self-pay | Admitting: Emergency Medicine

## 2019-08-07 ENCOUNTER — Telehealth: Payer: Self-pay

## 2019-08-07 DIAGNOSIS — Z9989 Dependence on other enabling machines and devices: Secondary | ICD-10-CM

## 2019-08-07 DIAGNOSIS — G4733 Obstructive sleep apnea (adult) (pediatric): Secondary | ICD-10-CM

## 2019-08-07 DIAGNOSIS — F418 Other specified anxiety disorders: Secondary | ICD-10-CM

## 2019-08-07 LAB — PAP, TP IMAGING W/ HPV RNA, RFLX HPV TYPE 16,18/45: HPV DNA High Risk: NOT DETECTED

## 2019-08-07 NOTE — Telephone Encounter (Signed)
Theresa Sawyer: Thank you for reviewing the compliance data.  I reviewed her 30-day report, she is fully compliant with her AutoPap and her 95th percentile of her AutoPap pressure is indeed right around 13, I will raise the maximum pressure to 15 cm at this time.  Megan, please send to DME and notify pt via ph call or email. thx

## 2019-08-07 NOTE — Telephone Encounter (Signed)
I reached out to [employer] and the pt and advised of Dr. Guadelupe Sabin order. Pt verbalized understanding and appreciation.

## 2019-08-07 NOTE — Telephone Encounter (Signed)
Patient has called today to complain about CPAP machine and it's current pressure. Pt is having difficulty with tolerating pressure stating that it feels like she is not getting enough air. Pt has called [facility] and they have asked patient to call MD to get CPAP pressure changed. I have pulled the patient's download and it is showing that machine is set at an auto pressure of 7-13 cmH2O. Pt's dl is showing that her 95th percentile pressure is at 12.9.  Would MD be willing to raise the auto pressures to help with patient's difficulty? New Rx to be sent to [facility]. Thanks!!

## 2019-08-07 NOTE — Addendum Note (Signed)
Addended by: Star Age on: 08/07/2019 12:35 PM   Modules accepted: Orders

## 2019-08-20 ENCOUNTER — Telehealth: Payer: Self-pay | Admitting: *Deleted

## 2019-08-20 MED ORDER — MEGESTROL ACETATE 40 MG PO TABS: 40.0000 mg | ORAL_TABLET | Freq: Every day | ORAL | 0 refills | Status: DC

## 2019-08-20 NOTE — Telephone Encounter (Signed)
Patient called requesting megace 40 mg tablet, has [facility] schedule on 08/26/19. Patient was told to stay on megace until [facility]. Last megace 40 mg Rx was filled by ER on 07/17/19. Please advise

## 2019-08-20 NOTE — Telephone Encounter (Signed)
Yes, she should stay on the Megace until sonohysterogram please call in refill for patient.  Let me know if she is having any bleeding .  Thank you

## 2019-08-20 NOTE — Telephone Encounter (Signed)
Patient informed, Rx sent.

## 2019-08-26 ENCOUNTER — Other Ambulatory Visit: Payer: Self-pay

## 2019-08-26 ENCOUNTER — Encounter: Payer: Self-pay | Admitting: Obstetrics and Gynecology

## 2019-08-26 ENCOUNTER — Ambulatory Visit (INDEPENDENT_AMBULATORY_CARE_PROVIDER_SITE_OTHER): Payer: 59

## 2019-08-26 ENCOUNTER — Ambulatory Visit (INDEPENDENT_AMBULATORY_CARE_PROVIDER_SITE_OTHER): Payer: 59 | Admitting: Obstetrics and Gynecology

## 2019-08-26 ENCOUNTER — Other Ambulatory Visit: Payer: Self-pay | Admitting: Obstetrics and Gynecology

## 2019-08-26 VITALS — BP 124/78

## 2019-08-26 DIAGNOSIS — N920 Excessive and frequent menstruation with regular cycle: Secondary | ICD-10-CM

## 2019-08-26 DIAGNOSIS — N939 Abnormal uterine and vaginal bleeding, unspecified: Secondary | ICD-10-CM

## 2019-08-26 DIAGNOSIS — N840 Polyp of corpus uteri: Secondary | ICD-10-CM

## 2019-08-26 DIAGNOSIS — Z7189 Other specified counseling: Secondary | ICD-10-CM

## 2019-08-26 DIAGNOSIS — D500 Iron deficiency anemia secondary to blood loss (chronic): Secondary | ICD-10-CM | POA: Diagnosis not present

## 2019-08-26 NOTE — Progress Notes (Signed)
   CORBIN HOTT  [birth year] [id number]  HPI The patient is a 39 y.o. [id number] who presents today for a pelvic ultrasound due to ongoing abnormal uterine bleeding.  Has been on Megace 40 mg daily and bleeding currently stopped. Previous BTL.    Seen in ED last month 07/16/2019 for abnormal and heavy bleeding, hgb was 9.1 at that time.  Was passing large clots at that point.  Long time history of heavy periods but notes bleeding is getting heavier. 08/05/2019 TSH normal, Pap smear ASCUS with negative HPV, hgb 11.0.    Past medical history,surgical history, problem list, medications, allergies, family history and social history were all reviewed and documented as reviewed in the EPIC chart.  ROS:  Feeling well. No dyspnea or chest pain on exertion.  No abdominal pain, change in bowel habits, black or bloody stools.  No urinary tract symptoms. GYN ROS: abnormal menses, + abnormal bleeding, no pelvic pain or discharge  Physical Exam  BP 124/78   LMP 07/25/2019   General: Pleasant female, no acute distress, alert and oriented PELVIC EXAM: deferred due to ultrasound  Pelvic ultrasound Uterus 8.6 x 6.0 x 5.1 cm, anteverted orientation No myometrial masses Endometrium significantly thick at 21 mm  3D imaging indicates several endometrial polyps likely present Bilateral ovaries normal, normal follicle patter No adnexal masses No free fluid.  Assessment 39 yo [id number] with left can abnormal uterine bleeding/menorrhagia, suspected endometrial polyps based on imaging  Plan We reviewed the results of the imaging today.  We discussed normal uterine anatomy and findings on today's ultrasound that indicate endometrial polyps which are typically benign growths in the endometrium, but we do not know the nature of polyps until they are removed and sent for pathology analysis.  Given her ongoing heavy bleeding, even at times causing her anemia, I would recommend proceeding with polypectomy by hysteroscopy D&C.   We briefly discussed the procedure as an outpatient procedure and we will have her back for a preoperative visit to discuss more in detail.  We discussed that removal of the polyps may help improve her menstrual bleeding profile temporarily, but other factors and/or regrowth of polyps may cause the heavy bleeding to return over time so she will need to consider various hormonal contraception options or an endometrial ablation may possibly be an option in the future if the tissue proves benign.  I provided her with the [employer] hysteroscopy informational pamphlet and will have staff contact her to set up a time for the procedure and a preoperative visit.  Continue Megace until the procedure.  All questions were answered by the end of the visit.   Joseph Pierini MD 08/26/19

## 2019-08-26 NOTE — Patient Instructions (Signed)
Please continue the Megace until you have the D&C procedure, then you can discontinue Staff will contact you to discuss timing of the procedure and a preoperative visit

## 2019-08-27 ENCOUNTER — Telehealth: Payer: Self-pay

## 2019-08-27 NOTE — Telephone Encounter (Signed)
Left message to call me.

## 2019-09-01 NOTE — Telephone Encounter (Signed)
Patient called me back and I discussed her insurance benefits as I understood them from the fax I received from her ins co.  I will mail her a financial letter. We discussed that the surgery co-payment will be due by one week prior to surgery.  I scheduled her for 10/24/19 at 7:30am at [facility].  We discussed need for pre op Covid screen pre op and I scheduled her accordingly. We reviewed the quarantine protocols after the Covid test.  Pre op appt was scheduled with Dr. Delilah Shan. Packet will be mailed.

## 2019-09-07 ENCOUNTER — Ambulatory Visit
Admission: EM | Admit: 2019-09-07 | Discharge: 2019-09-07 | Disposition: A | Discharge status: Home / Self Care | Payer: 59 | Attending: Emergency Medicine | Admitting: Emergency Medicine

## 2019-09-07 ENCOUNTER — Other Ambulatory Visit: Payer: Self-pay

## 2019-09-07 DIAGNOSIS — R519 Headache, unspecified: Secondary | ICD-10-CM | POA: Diagnosis not present

## 2019-09-07 MED ORDER — PREDNISONE 10 MG PO TABS: 20.0000 mg | ORAL_TABLET | Freq: Every day | ORAL | 0 refills | Status: DC

## 2019-09-07 MED ORDER — IBUPROFEN 800 MG PO TABS: 800.0000 mg | ORAL_TABLET | Freq: Three times a day (TID) | ORAL | 0 refills | Status: DC

## 2019-09-07 NOTE — ED Provider Notes (Addendum)
[facility]    CSN: [id number] Arrival date & time: 09/07/19  1322      History   Chief Complaint No chief complaint on file.   HPI Theresa Sawyer is a 39 y.o. female.   With history of psoriasis arthritis presents to the urgent care with a complaint of parietal scalp pain.  Denies any precipitating event.  She localizes the pain to a small area on her right parietal scalp.  States pain is under psoriasis rash on her scalp. She describes the pain as constant and achy.  She has tried OTC medications without relief.  Her symptoms are made worse with ROM.  He denies similar symptoms in the past.        Past Medical History:  Diagnosis Date  . Anxiety   . GERD (gastroesophageal reflux disease)   . High cholesterol   . Psoriasis   . Sterilization consult 01/29/2013   BTL in hosp desired. Papers signed 12/16/12   . SVD (spontaneous vaginal delivery) 02/06/2013    Patient Active Problem List   Diagnosis Date Noted  . OSA on CPAP 04/24/2019  . Psoriasis 12/05/2018  . Situational anxiety 12/05/2018  . Intermittent hypertension 12/05/2018    Past Surgical History:  Procedure Laterality Date  . TUBAL LIGATION Bilateral 02/05/2013   Procedure: POST PARTUM TUBAL LIGATION;  Surgeon: Woodroe Mode, MD;  Location: [facility];  Service: Gynecology;  Laterality: Bilateral;    OB History    Gravida  3   Para  1   Term  1   Preterm  0   AB  2   Living  1     SAB  1   TAB  1   Ectopic  0   Multiple  0   Live Births  1            Home Medications    Prior to Admission medications   Medication Sig Start Date End Date Taking? Authorizing Provider  ALPRAZolam Duanne Moron) 0.5 MG tablet TAKE 1 TABLET(0.5 MG) BY MOUTH DAILY AS NEEDED FOR ANXIETY 07/30/19   Horald Pollen, MD  aspirin EC 81 MG tablet Take 81 mg by mouth daily as needed for mild pain.     [provider]  Aspirin-Acetaminophen-Caffeine (GOODYS EXTRA STRENGTH PO) Take 1 packet by  mouth as needed.     [provider]  augmented betamethasone dipropionate (DIPROLENE AF) 0.05 % cream Apply topically 2 (two) times daily. 07/30/19   Horald Pollen, MD  cetirizine (ZYRTEC) 10 MG tablet Take 1 tablet (10 mg total) by mouth daily. Patient taking differently: Take 10 mg by mouth daily as needed for allergies.  07/10/19   Wurst, [place], PA-C  escitalopram (LEXAPRO) 20 MG tablet Take 1 tablet (20 mg total) by mouth daily. 03/13/19 07/16/19  Horald Pollen, MD  famotidine (PEPCID) 40 MG tablet TAKE 1 TABLET(40 MG) BY MOUTH TWICE DAILY 07/17/19   Thornton Park, MD  Ferrous Sulfate (IRON PO) Take 65 mg by mouth daily.    [provider]  ibuprofen (ADVIL) 800 MG tablet Take 1 tablet (800 mg total) by mouth 3 (three) times daily. Take with food 09/07/19   Alexia Dinger, Darrelyn Hillock, FNP  megestrol (MEGACE) 40 MG tablet Take 1 tablet (40 mg total) by mouth 3 (three) times daily. Until bleeding stops, then take one tablet daily until you see your gynecologist 07/17/19   Kem Parkinson, PA-C  megestrol (MEGACE) 40 MG tablet Take 1  tablet (40 mg total) by mouth daily. 08/20/19   Huel Cote, NP  Multiple Vitamins-Minerals (MULTIVITAMIN WITH MINERALS) tablet Take 1 tablet by mouth daily.    [provider]  omeprazole (PRILOSEC) 40 MG capsule TAKE 1 CAPSULE(40 MG) BY MOUTH DAILY 07/17/19   Thornton Park, MD  predniSONE (DELTASONE) 10 MG tablet Take 2 tablets (20 mg total) by mouth daily. 09/07/19   AvegnoDarrelyn Hillock, FNP    Family History Family History  Problem Relation Age of Onset  . Diabetes Maternal Grandmother   . Diabetes Paternal Grandmother   . Anxiety disorder Mother   . Heart attack Brother 34       deceased from this  . Anxiety disorder Brother   . Cirrhosis Maternal Grandfather        alcohol related  . Colon cancer Neg Hx   . Esophageal cancer Neg Hx   . Colon polyps Neg Hx   . Stomach cancer Neg Hx   . Pancreatic cancer Neg Hx      Social History Social History   Tobacco Use  . Smoking status: Current Some Day Smoker    Packs/day: 0.50    Types: Cigarettes  . Smokeless tobacco: Never Used  Substance Use Topics  . Alcohol use: No  . Drug use: No     Allergies   Patient has no known allergies.   Review of Systems Review of Systems  Constitutional: Negative.   Respiratory: Negative.   Cardiovascular: Negative.   Neurological: Positive for headaches.  All other systems reviewed and are negative.    Physical Exam Triage Vital Signs ED Triage Vitals  Enc Vitals Group     BP 09/07/19 1329 (!) 142/89     Pulse Rate 09/07/19 1329 72     Resp 09/07/19 1329 16     Temp 09/07/19 1329 98.2 F (36.8 C)     Temp Source 09/07/19 1329 Oral     SpO2 09/07/19 1329 98 %     Weight --      Height --      Head Circumference --      Peak Flow --      Pain Score 09/07/19 1337 5     Pain Loc --      Pain Edu? --      Excl. in [place]? --    No data found.  Updated Vital Signs BP (!) 142/89 (BP Location: Right Arm)   Pulse 72   Temp 98.2 F (36.8 C) (Oral)   Resp 16   SpO2 98%   Visual Acuity Right Eye Distance:   Left Eye Distance:   Bilateral Distance:    Right Eye Near:   Left Eye Near:    Bilateral Near:     Physical Exam Vitals and nursing note reviewed.  Constitutional:      General: She is not in acute distress.    Appearance: Normal appearance. She is normal weight. She is not toxic-appearing.  Cardiovascular:     Rate and Rhythm: Normal rate and regular rhythm.     Pulses: Normal pulses.     Heart sounds: Normal heart sounds. No murmur. No friction rub. No gallop.   Pulmonary:     Effort: Pulmonary effort is normal. No respiratory distress.     Breath sounds: Normal breath sounds. No stridor. No wheezing, rhonchi or rales.  Chest:     Chest wall: No tenderness.  Skin:    General: Skin is warm.  Findings: Rash present.     Comments: Psoriasis rash present  Neurological:      General: No focal deficit present.     Mental Status: She is alert. She is disoriented.     Cranial Nerves: Cranial nerves are intact. No cranial nerve deficit.     Sensory: Sensation is intact. No sensory deficit.     Motor: Motor function is intact. No weakness.     Coordination: Coordination is intact. Coordination normal.     Gait: Gait is intact.      UC Treatments / Results  Labs (all labs ordered are listed, but only abnormal results are displayed) Labs Reviewed - No data to display  EKG   Radiology No results found.  Procedures Procedures (including critical care time)  Medications Ordered in UC Medications - No data to display  Initial Impression / Assessment and Plan / UC Course  I have reviewed the triage vital signs and the nursing notes.  Pertinent labs & imaging results that were available during my care of the patient were reviewed by me and considered in my medical decision making (see chart for details).   Patient is stable discharge. Short-term prednisone was prescribed Ibuprofen was prescribed Patient was advised to keep and follow-up with primary care and dermatology Return for worsening of symptoms  Final Clinical Impressions(s) / UC Diagnoses   Final diagnoses:  Pain of scalp     Discharge Instructions     Prednisone was prescribed Take ibuprofen with food Follow-up with primary care and dermatology Return for worsening of symptoms    ED Prescriptions    Medication Sig Dispense Auth. Provider   predniSONE (DELTASONE) 10 MG tablet  (Status: Discontinued) Take 2 tablets (20 mg total) by mouth daily. 15 tablet Alena Blankenbeckler S, FNP   ibuprofen (ADVIL) 800 MG tablet  (Status: Discontinued) Take 1 tablet (800 mg total) by mouth 3 (three) times daily. Take with food 21 tablet Christen Bedoya S, FNP   ibuprofen (ADVIL) 800 MG tablet Take 1 tablet (800 mg total) by mouth 3 (three) times daily. Take with food 21 tablet Erian Rosengren S, FNP    predniSONE (DELTASONE) 10 MG tablet Take 2 tablets (20 mg total) by mouth daily. 15 tablet Otha Rickles, Darrelyn Hillock, FNP     PDMP not reviewed this encounter.   Emerson Monte, FNP 09/07/19 1411    Emerson Monte, FNP 09/07/19 1413

## 2019-09-07 NOTE — ED Triage Notes (Signed)
Pt presents to UC w/ c/o shooting, occipital pain from psoriatic arthritis. Break out now is worse.Not taking any medications at this time for this.

## 2019-09-07 NOTE — Discharge Instructions (Addendum)
Prednisone was prescribed Take ibuprofen with food Follow-up with primary care and dermatology Return for worsening of symptoms

## 2019-09-10 ENCOUNTER — Ambulatory Visit: Payer: BC Managed Care – PPO | Admitting: Emergency Medicine

## 2019-09-11 ENCOUNTER — Ambulatory Visit: Payer: 59 | Admitting: Emergency Medicine

## 2019-09-11 ENCOUNTER — Encounter: Payer: Self-pay | Admitting: Emergency Medicine

## 2019-09-11 ENCOUNTER — Other Ambulatory Visit: Payer: Self-pay

## 2019-09-11 ENCOUNTER — Other Ambulatory Visit: Payer: Self-pay | Admitting: Women's Health

## 2019-09-11 VITALS — BP 128/80 | HR 75 | Temp 98.6°F | Resp 16 | Ht 65.0 in | Wt 240.0 lb

## 2019-09-11 DIAGNOSIS — L4050 Arthropathic psoriasis, unspecified: Secondary | ICD-10-CM

## 2019-09-11 DIAGNOSIS — N938 Other specified abnormal uterine and vaginal bleeding: Secondary | ICD-10-CM

## 2019-09-11 DIAGNOSIS — Z9989 Dependence on other enabling machines and devices: Secondary | ICD-10-CM

## 2019-09-11 DIAGNOSIS — L409 Psoriasis, unspecified: Secondary | ICD-10-CM

## 2019-09-11 DIAGNOSIS — F418 Other specified anxiety disorders: Secondary | ICD-10-CM

## 2019-09-11 DIAGNOSIS — D500 Iron deficiency anemia secondary to blood loss (chronic): Secondary | ICD-10-CM | POA: Diagnosis not present

## 2019-09-11 DIAGNOSIS — G4733 Obstructive sleep apnea (adult) (pediatric): Secondary | ICD-10-CM

## 2019-09-11 MED ORDER — ALPRAZOLAM 0.5 MG PO TABS: ORAL_TABLET | ORAL | 0 refills | Status: DC

## 2019-09-11 MED ORDER — BETAMETHASONE DIPROPIONATE AUG 0.05 % EX CREA: TOPICAL_CREAM | Freq: Two times a day (BID) | CUTANEOUS | 3 refills | Status: DC

## 2019-09-11 NOTE — Telephone Encounter (Signed)
Was seen by Dr. Delilah Shan has endometrial polyps will need Megace 40 mg twice daily until hysteroscope /polypectomy April 16.  Megace 40 mg twice daily until bleeding stops and then daily until April 16 #60

## 2019-09-11 NOTE — Progress Notes (Signed)
Theresa Sawyer 39 y.o.   Chief Complaint  Patient presents with  . Anemia    follow up 2 months  . Referral    Dermatology for Psoriasis  . Medication Refill    alprazolam and Diprolene-af    HISTORY OF PRESENT ILLNESS: This is a 39 y.o. female here for follow-up of chronic medical problems: 1.  Anemia secondary to chronic blood loss secondary to menorrhagia.  Found to have multiple uterine polyps.  To undergo surgical procedure next April. 2.  History of psoriasis and psoriatic arthritis.  Needs referrals to both rheumatology and dermatology. 3.  Chronic situational anxiety, requesting Xanax refill. No other complaints or medical concerns today.  HPI   Prior to Admission medications   Medication Sig Start Date End Date Taking? Authorizing Provider  ALPRAZolam Duanne Moron) 0.5 MG tablet TAKE 1 TABLET(0.5 MG) BY MOUTH DAILY AS NEEDED FOR ANXIETY 07/30/19   Horald Pollen, MD  aspirin EC 81 MG tablet Take 81 mg by mouth daily as needed for mild pain.     [provider]  Aspirin-Acetaminophen-Caffeine (GOODYS EXTRA STRENGTH PO) Take 1 packet by mouth as needed.     [provider]  augmented betamethasone dipropionate (DIPROLENE AF) 0.05 % cream Apply topically 2 (two) times daily. 07/30/19   Horald Pollen, MD  cetirizine (ZYRTEC) 10 MG tablet Take 1 tablet (10 mg total) by mouth daily. Patient taking differently: Take 10 mg by mouth daily as needed for allergies.  07/10/19   Wurst, [place], PA-C  escitalopram (LEXAPRO) 20 MG tablet Take 1 tablet (20 mg total) by mouth daily. 03/13/19 07/16/19  Horald Pollen, MD  famotidine (PEPCID) 40 MG tablet TAKE 1 TABLET(40 MG) BY MOUTH TWICE DAILY 07/17/19   Thornton Park, MD  Ferrous Sulfate (IRON PO) Take 65 mg by mouth daily.    [provider]  ibuprofen (ADVIL) 800 MG tablet Take 1 tablet (800 mg total) by mouth 3 (three) times daily. Take with food 09/07/19   Avegno, Darrelyn Hillock, FNP  megestrol  (MEGACE) 40 MG tablet Take 1 tablet (40 mg total) by mouth 3 (three) times daily. Until bleeding stops, then take one tablet daily until you see your gynecologist 07/17/19   Kem Parkinson, PA-C  megestrol (MEGACE) 40 MG tablet Take 1 tablet (40 mg total) by mouth daily. 08/20/19   Huel Cote, NP  Multiple Vitamins-Minerals (MULTIVITAMIN WITH MINERALS) tablet Take 1 tablet by mouth daily.    [provider]  omeprazole (PRILOSEC) 40 MG capsule TAKE 1 CAPSULE(40 MG) BY MOUTH DAILY 07/17/19   Thornton Park, MD  predniSONE (DELTASONE) 10 MG tablet Take 2 tablets (20 mg total) by mouth daily. 09/07/19   Avegno, Darrelyn Hillock, FNP    No Known Allergies  Patient Active Problem List   Diagnosis Date Noted  . OSA on CPAP 04/24/2019  . Psoriasis 12/05/2018  . Situational anxiety 12/05/2018  . Intermittent hypertension 12/05/2018    Past Medical History:  Diagnosis Date  . Anxiety   . GERD (gastroesophageal reflux disease)   . High cholesterol   . Psoriasis   . Sterilization consult 01/29/2013   BTL in hosp desired. Papers signed 12/16/12   . SVD (spontaneous vaginal delivery) 02/06/2013    Past Surgical History:  Procedure Laterality Date  . TUBAL LIGATION Bilateral 02/05/2013   Procedure: POST PARTUM TUBAL LIGATION;  Surgeon: Woodroe Mode, MD;  Location: [facility];  Service: Gynecology;  Laterality: Bilateral;    Social History  Socioeconomic History  . Marital status: Legally Separated    Spouse name: Not on file  . Number of children: Not on file  . Years of education: Not on file  . Highest education level: Not on file  Occupational History  . Not on file  Tobacco Use  . Smoking status: Current Some Day Smoker    Packs/day: 0.50    Types: Cigarettes  . Smokeless tobacco: Never Used  Substance and Sexual Activity  . Alcohol use: No  . Drug use: No  . Sexual activity: Yes    Birth control/protection: Surgical  Other Topics Concern  . Not on file  Social History  Narrative  . Not on file   Social Determinants of Health   Financial Resource Strain:   . Difficulty of Paying Living Expenses: Not on file  Food Insecurity:   . Worried About Charity fundraiser in the Last Year: Not on file  . Ran Out of Food in the Last Year: Not on file  Transportation Needs:   . Lack of Transportation (Medical): Not on file  . Lack of Transportation (Non-Medical): Not on file  Physical Activity:   . Days of Exercise per Week: Not on file  . Minutes of Exercise per Session: Not on file  Stress:   . Feeling of Stress : Not on file  Social Connections:   . Frequency of Communication with Friends and Family: Not on file  . Frequency of Social Gatherings with Friends and Family: Not on file  . Attends Religious Services: Not on file  . Active Member of Clubs or Organizations: Not on file  . Attends Archivist Meetings: Not on file  . Marital Status: Not on file  Intimate Partner Violence:   . Fear of Current or Ex-Partner: Not on file  . Emotionally Abused: Not on file  . Physically Abused: Not on file  . Sexually Abused: Not on file    Family History  Problem Relation Age of Onset  . Diabetes Maternal Grandmother   . Diabetes Paternal Grandmother   . Anxiety disorder Mother   . Heart attack Brother 34       deceased from this  . Anxiety disorder Brother   . Cirrhosis Maternal Grandfather        alcohol related  . Colon cancer Neg Hx   . Esophageal cancer Neg Hx   . Colon polyps Neg Hx   . Stomach cancer Neg Hx   . Pancreatic cancer Neg Hx      Review of Systems  Constitutional: Negative.  Negative for chills and fever.  HENT: Negative.  Negative for congestion and sore throat.   Respiratory: Negative.  Negative for cough and shortness of breath.   Cardiovascular: Negative.  Negative for chest pain and palpitations.  Gastrointestinal: Negative.  Negative for abdominal pain, blood in stool, diarrhea, melena, nausea and vomiting.    Genitourinary: Negative.  Negative for dysuria and hematuria.  Musculoskeletal: Positive for joint pain. Negative for myalgias and neck pain.  Skin: Positive for rash.  Neurological: Negative for dizziness and headaches.  All other systems reviewed and are negative.   Today's Vitals   09/11/19 1037  BP: 128/80  Pulse: 75  Resp: 16  Temp: 98.6 F (37 C)  TempSrc: Temporal  SpO2: 97%  Weight: 240 lb (108.9 kg)  Height: 5' 5"  (1.651 m)   Body mass index is 39.94 kg/m.  Physical Exam Vitals reviewed.  Constitutional:  Appearance: Normal appearance. She is obese.  HENT:     Head: Normocephalic.  Eyes:     Extraocular Movements: Extraocular movements intact.     Pupils: Pupils are equal, round, and reactive to light.  Cardiovascular:     Rate and Rhythm: Normal rate and regular rhythm.     Pulses: Normal pulses.     Heart sounds: Normal heart sounds.  Pulmonary:     Effort: Pulmonary effort is normal.     Breath sounds: Normal breath sounds.  Musculoskeletal:     Cervical back: Normal range of motion and neck supple.  Skin:    General: Skin is warm and dry.     Capillary Refill: Capillary refill takes less than 2 seconds.     Findings: Rash present.     Comments: Multiple psoriatic patches in both elbow areas and knee areas as well as forearms and shins.  Uncontrolled.  Neurological:     General: No focal deficit present.     Mental Status: She is alert and oriented to person, place, and time.  Psychiatric:        Mood and Affect: Mood normal.        Behavior: Behavior normal.      ASSESSMENT & PLAN: Val[place]e was seen today for anemia, referral and medication refill.  Diagnoses and all orders for this visit:  Psoriatic arthritis ([place]) -     Ambulatory referral to Rheumatology  Situational anxiety -     ALPRAZolam (XANAX) 0.5 MG tablet; TAKE 1 TABLET(0.5 MG) BY MOUTH DAILY AS NEEDED FOR ANXIETY  Anemia due to chronic blood loss  Psoriasis -      augmented betamethasone dipropionate (DIPROLENE AF) 0.05 % cream; Apply topically 2 (two) times daily. -     Ambulatory referral to Dermatology  OSA on CPAP  Dysfunctional uterine bleeding    Patient Instructions       If you have lab work done today you will be contacted with your lab results within the next 2 weeks.  If you have not heard from [place] then please contact us. The fastest way to get your results is to register for My Chart.   IF you received an x-ray today, you will receive an invoice from [facility]. Please contact [facility] at [phone] with questions or concerns regarding your invoice.   IF you received labwork today, you will receive an invoice from [facility]. Please contact [facility] at [phone] with questions or concerns regarding your invoice.   Our billing staff will not be able to assist you with questions regarding bills from these companies.  You will be contacted with the lab results as soon as they are available. The fastest way to get your results is to activate your My Chart account. Instructions are located on the last page of this paperwork. If you have not heard from [place] regarding the results in 2 weeks, please contact this office.     Psoriasis Psoriasis is a long-term (chronic) skin condition. It occurs because your body's defense system (immune system) causes skin cells to form too quickly. This causes raised, red patches (plaques) on your skin that look silvery. The patches may be on all areas of your body. They can be any size or shape. Psoriasis can come and go. It can range from mild to very bad. It cannot be passed from one person to another (is not contagious). There is no cure for this condition, but it can be helped with treatment. What  are the causes? The cause of psoriasis is not known. Some things can make it worse. These are:  Skin damage, such as cuts, scrapes, sunburn, and dryness.  Not getting enough  sunlight.  Some medicines.  Alcohol.  Tobacco.  Stress.  Infections. What increases the risk?  Having a family member with psoriasis.  Being very overweight (obese).  Being 20-33 years old.  Taking certain medicines. What are the signs or symptoms? There are different types of psoriasis. The types are:  Plaque. This is the most common. Symptoms include red, raised patches with a silvery coating. These may be itchy. Your nails may be crumbly or fall off.  Guttate. Symptoms include small red spots on your stomach area, arms, and legs. These may happen after you have been sick, such as with strep throat.  Inverse. Symptoms include patches in your armpits, under your breasts, private areas, or on your butt.  Pustular. Symptoms include pus-filled bumps on the palms of your hands or the soles of your feet. You also may feel very tired, weak, have a fever, and not be hungry.  Erythrodermic. Symptoms include bright red skin that looks burned. You may have a fast heartbeat and a body temperature that is too high or too low. You may be itchy or in pain.  Sebopsoriasis. Symptoms include red patches on your scalp, forehead, and face that are greasy.  Psoriatic arthritis. Symptoms include swollen, painful joints along with scaly skin patches. How is this treated? There is no cure for this condition, but treatment can:  Help your skin heal.  Lessen itching and irritation and swelling (inflammation).  Slow the growth of new skin cells.  Help your body's defense system respond better to your skin. Treatment may include:  Creams or ointments.  Light therapy. This may include natural sunlight or light therapy in a doctor's office.  Medicines. These can help your body better manage skin cells. They may be used with light therapy or ointments. Medicines may include pills or injections. You may also get antibiotic medicines if you have an infection. Follow these instructions at  home: Skin Care  Apply lotion to your skin as needed. Only use those that your doctor has said are okay.  Apply cool, wet cloths (cold compresses) to the affected areas.  Do not use a hot tub or take hot showers. Use slightly warm, not hot, water when taking showers and baths.  Do not scratch your skin. Lifestyle   Do not use any products that contain nicotine or tobacco, such as cigarettes, e-cigarettes, and chewing tobacco. If you need help quitting, ask your doctor.  Lower your stress.  Keep a healthy weight.  Go out in the sun as told by your doctor. Do not get sunburned.  Join a support group. Medicines  Take or use over-the-counter and prescription medicines only as told by your doctor.  If you were prescribed an antibiotic medicine, take it as told by your doctor. Do not stop using the antibiotic even if you start to feel better. Alcohol use If you drink alcohol:  Limit how much you use: ? 0-1 drink a day for women. ? 0-2 drinks a day for men.  Be aware of how much alcohol is in your drink. In the [place] one drink equals one 12 oz bottle of beer (355 mL), one 5 oz glass of wine (148 mL), or one 1 oz glass of hard liquor (44 mL). General instructions  Keep a journal to track the things  that cause symptoms (triggers). Try to avoid these things.  See a counselor if you feel the support would help.  Keep all follow-up visits as told by your doctor. This is important. Contact a doctor if:  You have a fever.  Your pain gets worse.  You have more redness or warmth in the affected areas.  You have new or worse pain or stiffness in your joints.  Your nails start to break easily or pull away from the nail bed.  You feel very sad (depressed). Summary  Psoriasis is a long-term (chronic) skin condition.  There is no cure for this condition, but treatment can help manage it.  Keep a journal to track the things that cause symptoms.  Take or use over-the-counter  and prescription medicines only as told by your doctor.  Keep all follow-up visits as told by your doctor. This is important. This information is not intended to replace advice given to you by your health care provider. Make sure you discuss any questions you have with your health care provider. Document Revised: 04/30/2018 Document Reviewed: 04/30/2018 Elsevier Patient Education  2020 Elsevier Inc.      Agustina Caroli, MD [facility]

## 2019-09-11 NOTE — Patient Instructions (Addendum)
If you have lab work done today you will be contacted with your lab results within the next 2 weeks.  If you have not heard from [place] then please contact us. The fastest way to get your results is to register for My Chart.   IF you received an x-ray today, you will receive an invoice from [facility]. Please contact [facility] at [phone] with questions or concerns regarding your invoice.   IF you received labwork today, you will receive an invoice from [place]. Please contact [facility] at [phone] with questions or concerns regarding your invoice.   Our billing staff will not be able to assist you with questions regarding bills from these companies.  You will be contacted with the lab results as soon as they are available. The fastest way to get your results is to activate your My Chart account. Instructions are located on the last page of this paperwork. If you have not heard from [place] regarding the results in 2 weeks, please contact this office.     Psoriasis Psoriasis is a long-term (chronic) skin condition. It occurs because your body's defense system (immune system) causes skin cells to form too quickly. This causes raised, red patches (plaques) on your skin that look silvery. The patches may be on all areas of your body. They can be any size or shape. Psoriasis can come and go. It can range from mild to very bad. It cannot be passed from one person to another (is not contagious). There is no cure for this condition, but it can be helped with treatment. What are the causes? The cause of psoriasis is not known. Some things can make it worse. These are:  Skin damage, such as cuts, scrapes, sunburn, and dryness.  Not getting enough sunlight.  Some medicines.  Alcohol.  Tobacco.  Stress.  Infections. What increases the risk?  Having a family member with psoriasis.  Being very overweight (obese).  Being 21-81 years old.  Taking certain  medicines. What are the signs or symptoms? There are different types of psoriasis. The types are:  Plaque. This is the most common. Symptoms include red, raised patches with a silvery coating. These may be itchy. Your nails may be crumbly or fall off.  Guttate. Symptoms include small red spots on your stomach area, arms, and legs. These may happen after you have been sick, such as with strep throat.  Inverse. Symptoms include patches in your armpits, under your breasts, private areas, or on your butt.  Pustular. Symptoms include pus-filled bumps on the palms of your hands or the soles of your feet. You also may feel very tired, weak, have a fever, and not be hungry.  Erythrodermic. Symptoms include bright red skin that looks burned. You may have a fast heartbeat and a body temperature that is too high or too low. You may be itchy or in pain.  Sebopsoriasis. Symptoms include red patches on your scalp, forehead, and face that are greasy.  Psoriatic arthritis. Symptoms include swollen, painful joints along with scaly skin patches. How is this treated? There is no cure for this condition, but treatment can:  Help your skin heal.  Lessen itching and irritation and swelling (inflammation).  Slow the growth of new skin cells.  Help your body's defense system respond better to your skin. Treatment may include:  Creams or ointments.  Light therapy. This may include natural sunlight or light therapy in a doctor's office.  Medicines. These can help your  body better manage skin cells. They may be used with light therapy or ointments. Medicines may include pills or injections. You may also get antibiotic medicines if you have an infection. Follow these instructions at home: Skin Care  Apply lotion to your skin as needed. Only use those that your doctor has said are okay.  Apply cool, wet cloths (cold compresses) to the affected areas.  Do not use a hot tub or take hot showers. Use  slightly warm, not hot, water when taking showers and baths.  Do not scratch your skin. Lifestyle   Do not use any products that contain nicotine or tobacco, such as cigarettes, e-cigarettes, and chewing tobacco. If you need help quitting, ask your doctor.  Lower your stress.  Keep a healthy weight.  Go out in the sun as told by your doctor. Do not get sunburned.  Join a support group. Medicines  Take or use over-the-counter and prescription medicines only as told by your doctor.  If you were prescribed an antibiotic medicine, take it as told by your doctor. Do not stop using the antibiotic even if you start to feel better. Alcohol use If you drink alcohol:  Limit how much you use: ? 0-1 drink a day for women. ? 0-2 drinks a day for men.  Be aware of how much alcohol is in your drink. In the [place] one drink equals one 12 oz bottle of beer (355 mL), one 5 oz glass of wine (148 mL), or one 1 oz glass of hard liquor (44 mL). General instructions  Keep a journal to track the things that cause symptoms (triggers). Try to avoid these things.  See a counselor if you feel the support would help.  Keep all follow-up visits as told by your doctor. This is important. Contact a doctor if:  You have a fever.  Your pain gets worse.  You have more redness or warmth in the affected areas.  You have new or worse pain or stiffness in your joints.  Your nails start to break easily or pull away from the nail bed.  You feel very sad (depressed). Summary  Psoriasis is a long-term (chronic) skin condition.  There is no cure for this condition, but treatment can help manage it.  Keep a journal to track the things that cause symptoms.  Take or use over-the-counter and prescription medicines only as told by your doctor.  Keep all follow-up visits as told by your doctor. This is important. This information is not intended to replace advice given to you by your health care provider.  Make sure you discuss any questions you have with your health care provider. Document Revised: 04/30/2018 Document Reviewed: 04/30/2018 Elsevier Patient Education  2020 [employer].

## 2019-09-12 NOTE — Telephone Encounter (Signed)
Rx sent 

## 2019-09-15 ENCOUNTER — Telehealth: Payer: Self-pay

## 2019-09-15 NOTE — Telephone Encounter (Signed)
D&C, Hysteroscopy is scheduled for 10/24/19. Patient is on Megace and has started bleeding again. Not heavy. Starting to feel weak again like hemoglobin dropping.(9.1 two mos ago.) She is taking one 40 mg. Megace daily.  I recommended she take two Megace a day until bleeding stops.  I told her I would check with Dr. Delilah Shan tomorrow to be sure he is okay with that recommendation. Also, she is only taking 65 mg of iron daily. Would you want her to increase that?

## 2019-09-16 ENCOUNTER — Telehealth: Payer: Self-pay | Admitting: Family Medicine

## 2019-09-16 NOTE — Telephone Encounter (Signed)
Left message for patient to call me

## 2019-09-16 NOTE — Telephone Encounter (Signed)
Okay to do Megace 40 mg BID, and she can increase the iron to 65 mg BID. Recheck CBC within the week.

## 2019-09-16 NOTE — Telephone Encounter (Signed)
Pt has called to report her CPAP is not pushing out air properly, she is asking for a call to discuss

## 2019-09-17 NOTE — Telephone Encounter (Signed)
I called pt and relayed the message re: cpap dpwnload information.  If machine is problem, contact [facility] [phone] and see what they have to say.  She is to call back if questions.

## 2019-09-17 NOTE — Telephone Encounter (Signed)
Compliance report looks ok. I am not sure what she means by not pushing out air properly. Is it a problem with the machine? Pressure settings look perfect and apnea is well managed.

## 2019-09-17 NOTE — Telephone Encounter (Signed)
cpap download for pt on desk to review as her stating cpap not pushing out air.

## 2019-09-18 ENCOUNTER — Other Ambulatory Visit: Payer: Self-pay

## 2019-09-18 DIAGNOSIS — N938 Other specified abnormal uterine and vaginal bleeding: Secondary | ICD-10-CM

## 2019-09-18 DIAGNOSIS — Z862 Personal history of diseases of the blood and blood-forming organs and certain disorders involving the immune mechanism: Secondary | ICD-10-CM

## 2019-09-18 NOTE — Telephone Encounter (Signed)
Per DPR access note on file I left detailed message advising per Dr. Scarlette Ar reply. CBC order placed and patient was advised to call for lab appt.

## 2019-10-02 ENCOUNTER — Other Ambulatory Visit: Payer: Self-pay

## 2019-10-02 ENCOUNTER — Encounter: Payer: Self-pay | Admitting: Emergency Medicine

## 2019-10-02 ENCOUNTER — Ambulatory Visit: Payer: 59 | Admitting: Emergency Medicine

## 2019-10-02 VITALS — BP 125/84 | HR 81 | Temp 98.1°F | Resp 16 | Ht 65.0 in | Wt 239.0 lb

## 2019-10-02 DIAGNOSIS — L4050 Arthropathic psoriasis, unspecified: Secondary | ICD-10-CM

## 2019-10-02 DIAGNOSIS — L409 Psoriasis, unspecified: Secondary | ICD-10-CM

## 2019-10-02 MED ORDER — OTEZLA 10 & 20 & 30 MG PO TBPK: ORAL_TABLET | ORAL | 5 refills | Status: DC

## 2019-10-02 NOTE — Patient Instructions (Addendum)
If you have lab work done today you will be contacted with your lab results within the next 2 weeks.  If you have not heard from [place] then please contact us. The fastest way to get your results is to register for My Chart.   IF you received an x-ray today, you will receive an invoice from [facility]. Please contact [facility] at [phone] with questions or concerns regarding your invoice.   IF you received labwork today, you will receive an invoice from [facility]. Please contact [facility] at [phone] with questions or concerns regarding your invoice.   Our billing staff will not be able to assist you with questions regarding bills from these companies.  You will be contacted with the lab results as soon as they are available. The fastest way to get your results is to activate your My Chart account. Instructions are located on the last page of this paperwork. If you have not heard from [place] regarding the results in 2 weeks, please contact this office.     Psoriasis Psoriasis is a long-term (chronic) skin condition. It occurs because your body's defense system (immune system) causes skin cells to form too quickly. This causes raised, red patches (plaques) on your skin that look silvery. The patches may be on all areas of your body. They can be any size or shape. Psoriasis can come and go. It can range from mild to very bad. It cannot be passed from one person to another (is not contagious). There is no cure for this condition, but it can be helped with treatment. What are the causes? The cause of psoriasis is not known. Some things can make it worse. These are:  Skin damage, such as cuts, scrapes, sunburn, and dryness.  Not getting enough sunlight.  Some medicines.  Alcohol.  Tobacco.  Stress.  Infections. What increases the risk?  Having a family member with psoriasis.  Being very overweight (obese).  Being 36-58 years old.  Taking certain  medicines. What are the signs or symptoms? There are different types of psoriasis. The types are:  Plaque. This is the most common. Symptoms include red, raised patches with a silvery coating. These may be itchy. Your nails may be crumbly or fall off.  Guttate. Symptoms include small red spots on your stomach area, arms, and legs. These may happen after you have been sick, such as with strep throat.  Inverse. Symptoms include patches in your armpits, under your breasts, private areas, or on your butt.  Pustular. Symptoms include pus-filled bumps on the palms of your hands or the soles of your feet. You also may feel very tired, weak, have a fever, and not be hungry.  Erythrodermic. Symptoms include bright red skin that looks burned. You may have a fast heartbeat and a body temperature that is too high or too low. You may be itchy or in pain.  Sebopsoriasis. Symptoms include red patches on your scalp, forehead, and face that are greasy.  Psoriatic arthritis. Symptoms include swollen, painful joints along with scaly skin patches. How is this treated? There is no cure for this condition, but treatment can:  Help your skin heal.  Lessen itching and irritation and swelling (inflammation).  Slow the growth of new skin cells.  Help your body's defense system respond better to your skin. Treatment may include:  Creams or ointments.  Light therapy. This may include natural sunlight or light therapy in a doctor's office.  Medicines. These can help your  body better manage skin cells. They may be used with light therapy or ointments. Medicines may include pills or injections. You may also get antibiotic medicines if you have an infection. Follow these instructions at home: Skin Care  Apply lotion to your skin as needed. Only use those that your doctor has said are okay.  Apply cool, wet cloths (cold compresses) to the affected areas.  Do not use a hot tub or take hot showers. Use  slightly warm, not hot, water when taking showers and baths.  Do not scratch your skin. Lifestyle   Do not use any products that contain nicotine or tobacco, such as cigarettes, e-cigarettes, and chewing tobacco. If you need help quitting, ask your doctor.  Lower your stress.  Keep a healthy weight.  Go out in the sun as told by your doctor. Do not get sunburned.  Join a support group. Medicines  Take or use over-the-counter and prescription medicines only as told by your doctor.  If you were prescribed an antibiotic medicine, take it as told by your doctor. Do not stop using the antibiotic even if you start to feel better. Alcohol use If you drink alcohol:  Limit how much you use: ? 0-1 drink a day for women. ? 0-2 drinks a day for men.  Be aware of how much alcohol is in your drink. In the [place] one drink equals one 12 oz bottle of beer (355 mL), one 5 oz glass of wine (148 mL), or one 1 oz glass of hard liquor (44 mL). General instructions  Keep a journal to track the things that cause symptoms (triggers). Try to avoid these things.  See a counselor if you feel the support would help.  Keep all follow-up visits as told by your doctor. This is important. Contact a doctor if:  You have a fever.  Your pain gets worse.  You have more redness or warmth in the affected areas.  You have new or worse pain or stiffness in your joints.  Your nails start to break easily or pull away from the nail bed.  You feel very sad (depressed). Summary  Psoriasis is a long-term (chronic) skin condition.  There is no cure for this condition, but treatment can help manage it.  Keep a journal to track the things that cause symptoms.  Take or use over-the-counter and prescription medicines only as told by your doctor.  Keep all follow-up visits as told by your doctor. This is important. This information is not intended to replace advice given to you by your health care provider.  Make sure you discuss any questions you have with your health care provider. Document Revised: 04/30/2018 Document Reviewed: 04/30/2018 Elsevier Patient Education  2020 [employer].

## 2019-10-02 NOTE — Progress Notes (Signed)
Theresa Sawyer 39 y.o.   Chief Complaint  Patient presents with  . Psoriasis    maybe change the cream and discuss Arthritis until appts    HISTORY OF PRESENT ILLNESS: This is a 39 y.o. female with history of psoriasis and psoriatic arthritis complaining of rash getting out of control and expanding.  Also has multiple joint pains.  Seen by me 2 weeks ago and referred to both dermatology and rheumatology.  First appointment is still 1 month away.  In the past she has been on methotrexate and Otezla.  Would like to go back on [place].  Topical steroid creams not helping much and areas are too widespread.  HPI   Prior to Admission medications   Medication Sig Start Date End Date Taking? Authorizing Provider  ALPRAZolam (XANAX) 0.5 MG tablet TAKE 1 TABLET(0.5 MG) BY MOUTH DAILY AS NEEDED FOR ANXIETY 09/11/19  Yes Jalacia Mattila, Ines Bloomer, MD  Aspirin-Acetaminophen-Caffeine (GOODYS EXTRA STRENGTH PO) Take 1 packet by mouth as needed.    Yes [provider]  augmented betamethasone dipropionate (DIPROLENE AF) 0.05 % cream Apply topically 2 (two) times daily. 09/11/19  Yes Mieka Leaton, Ines Bloomer, MD  famotidine (PEPCID) 40 MG tablet TAKE 1 TABLET(40 MG) BY MOUTH TWICE DAILY 07/17/19  Yes Thornton Park, MD  Ferrous Sulfate (IRON PO) Take 65 mg by mouth daily.   Yes [provider]  megestrol (MEGACE) 40 MG tablet Take 1 tablet (40 mg total) by mouth 3 (three) times daily. Until bleeding stops, then take one tablet daily until you see your gynecologist 07/17/19  Yes Triplett, Tammy, PA-C  megestrol (MEGACE) 40 MG tablet Take one tab po twice daily until bleeding stops then one tab po daily until surgery. 09/12/19  Yes Huel Cote, NP  Multiple Vitamins-Minerals (MULTIVITAMIN WITH MINERALS) tablet Take 1 tablet by mouth daily.   Yes [provider]  omeprazole (PRILOSEC) 40 MG capsule TAKE 1 CAPSULE(40 MG) BY MOUTH DAILY 07/17/19  Yes Thornton Park, MD  aspirin EC 81 MG tablet  Take 81 mg by mouth daily as needed for mild pain.     [provider]  cetirizine (ZYRTEC) 10 MG tablet Take 1 tablet (10 mg total) by mouth daily. Patient not taking: Reported on 10/02/2019 07/10/19   Wurst, [place], PA-C  escitalopram (LEXAPRO) 20 MG tablet Take 1 tablet (20 mg total) by mouth daily. 03/13/19 07/16/19  Horald Pollen, MD  ibuprofen (ADVIL) 800 MG tablet Take 1 tablet (800 mg total) by mouth 3 (three) times daily. Take with food Patient not taking: Reported on 10/02/2019 09/07/19   Emerson Monte, FNP  predniSONE (DELTASONE) 10 MG tablet Take 2 tablets (20 mg total) by mouth daily. Patient not taking: Reported on 10/02/2019 09/07/19   Emerson Monte, FNP    No Known Allergies  Patient Active Problem List   Diagnosis Date Noted  . OSA on CPAP 04/24/2019  . Psoriasis 12/05/2018  . Situational anxiety 12/05/2018  . Intermittent hypertension 12/05/2018    Past Medical History:  Diagnosis Date  . Anxiety   . GERD (gastroesophageal reflux disease)   . High cholesterol   . Psoriasis   . Sterilization consult 01/29/2013   BTL in hosp desired. Papers signed 12/16/12   . SVD (spontaneous vaginal delivery) 02/06/2013    Past Surgical History:  Procedure Laterality Date  . TUBAL LIGATION Bilateral 02/05/2013   Procedure: POST PARTUM TUBAL LIGATION;  Surgeon: Woodroe Mode, MD;  Location: [facility];  Service:  Gynecology;  Laterality: Bilateral;    Social History   Socioeconomic History  . Marital status: Legally Separated    Spouse name: Not on file  . Number of children: Not on file  . Years of education: Not on file  . Highest education level: Not on file  Occupational History  . Not on file  Tobacco Use  . Smoking status: Current Some Day Smoker    Packs/day: 0.50    Types: Cigarettes  . Smokeless tobacco: Never Used  Substance and Sexual Activity  . Alcohol use: No  . Drug use: No  . Sexual activity: Yes    Birth control/protection: Surgical    Other Topics Concern  . Not on file  Social History Narrative  . Not on file   Social Determinants of Health   Financial Resource Strain:   . Difficulty of Paying Living Expenses:   Food Insecurity:   . Worried About Charity fundraiser in the Last Year:   . Arboriculturist in the Last Year:   Transportation Needs:   . Film/video editor (Medical):   Marland Kitchen Lack of Transportation (Non-Medical):   Physical Activity:   . Days of Exercise per Week:   . Minutes of Exercise per Session:   Stress:   . Feeling of Stress :   Social Connections:   . Frequency of Communication with Friends and Family:   . Frequency of Social Gatherings with Friends and Family:   . Attends Religious Services:   . Active Member of Clubs or Organizations:   . Attends Archivist Meetings:   Marland Kitchen Marital Status:   Intimate Partner Violence:   . Fear of Current or Ex-Partner:   . Emotionally Abused:   Marland Kitchen Physically Abused:   . Sexually Abused:     Family History  Problem Relation Age of Onset  . Diabetes Maternal Grandmother   . Diabetes Paternal Grandmother   . Anxiety disorder Mother   . Heart attack Brother 34       deceased from this  . Anxiety disorder Brother   . Cirrhosis Maternal Grandfather        alcohol related  . Colon cancer Neg Hx   . Esophageal cancer Neg Hx   . Colon polyps Neg Hx   . Stomach cancer Neg Hx   . Pancreatic cancer Neg Hx      Review of Systems  Constitutional: Negative.  Negative for chills and fever.  HENT: Negative.  Negative for congestion and sore throat.   Respiratory: Negative.  Negative for cough and shortness of breath.   Cardiovascular: Negative.  Negative for chest pain and palpitations.  Gastrointestinal: Negative.  Negative for abdominal pain, blood in stool, diarrhea, melena, nausea and vomiting.  Genitourinary: Negative.  Negative for dysuria and hematuria.  Musculoskeletal: Positive for joint pain.  Skin: Positive for itching and rash.   Neurological: Negative.  Negative for dizziness and headaches.  All other systems reviewed and are negative.   Vitals:   10/02/19 1004  BP: 125/84  Pulse: 81  Resp: 16  Temp: 98.1 F (36.7 C)  SpO2: 99%    Physical Exam Vitals reviewed.  Constitutional:      Appearance: Normal appearance. She is obese.  HENT:     Head: Normocephalic.  Eyes:     Extraocular Movements: Extraocular movements intact.     Pupils: Pupils are equal, round, and reactive to light.  Cardiovascular:     Rate and Rhythm: Normal  rate.  Pulmonary:     Effort: Pulmonary effort is normal.  Musculoskeletal:        General: Normal range of motion.     Cervical back: Normal range of motion.  Skin:    General: Skin is warm and dry.     Capillary Refill: Capillary refill takes less than 2 seconds.     Findings: Rash present.  Neurological:     General: No focal deficit present.     Mental Status: She is alert and oriented to person, place, and time.  Psychiatric:        Mood and Affect: Mood normal.        Behavior: Behavior normal.          ASSESSMENT & PLAN: Lonnie was seen today for psoriasis.  Diagnoses and all orders for this visit:  Psoriasis -     Apremilast (OTEZLA) 10 & 20 & 30 MG TBPK; Day 1: 10 mg in the morning.  Day 2: 10 mg in the morning and 10 mg in the evening.  Day 3: 10 mg in the morning and 20 mg in the evening.  Day 4: 20 mg in the morning and 20 mg in the evening.  Day 5: 20 mg in the morning and 30 mg in the evening.  Day 6 and thereafter: 30 mg twice daily.  Psoriatic arthritis [facility])  Follow-up with both dermatology and rheumatology as scheduled.  Referrals placed 2 weeks ago.  Patient Instructions       If you have lab work done today you will be contacted with your lab results within the next 2 weeks.  If you have not heard from [place] then please contact us. The fastest way to get your results is to register for My Chart.   IF you received an x-ray today, you  will receive an invoice from [facility]. Please contact [facility] at [phone] with questions or concerns regarding your invoice.   IF you received labwork today, you will receive an invoice from [facility]. Please contact [facility] at [phone] with questions or concerns regarding your invoice.   Our billing staff will not be able to assist you with questions regarding bills from these companies.  You will be contacted with the lab results as soon as they are available. The fastest way to get your results is to activate your My Chart account. Instructions are located on the last page of this paperwork. If you have not heard from [place] regarding the results in 2 weeks, please contact this office.     Psoriasis Psoriasis is a long-term (chronic) skin condition. It occurs because your body's defense system (immune system) causes skin cells to form too quickly. This causes raised, red patches (plaques) on your skin that look silvery. The patches may be on all areas of your body. They can be any size or shape. Psoriasis can come and go. It can range from mild to very bad. It cannot be passed from one person to another (is not contagious). There is no cure for this condition, but it can be helped with treatment. What are the causes? The cause of psoriasis is not known. Some things can make it worse. These are:  Skin damage, such as cuts, scrapes, sunburn, and dryness.  Not getting enough sunlight.  Some medicines.  Alcohol.  Tobacco.  Stress.  Infections. What increases the risk?  Having a family member with psoriasis.  Being very overweight (obese).  Being 19-17 years old.  Taking  certain medicines. What are the signs or symptoms? There are different types of psoriasis. The types are:  Plaque. This is the most common. Symptoms include red, raised patches with a silvery coating. These may be itchy. Your nails may be crumbly or fall off.  Guttate. Symptoms  include small red spots on your stomach area, arms, and legs. These may happen after you have been sick, such as with strep throat.  Inverse. Symptoms include patches in your armpits, under your breasts, private areas, or on your butt.  Pustular. Symptoms include pus-filled bumps on the palms of your hands or the soles of your feet. You also may feel very tired, weak, have a fever, and not be hungry.  Erythrodermic. Symptoms include bright red skin that looks burned. You may have a fast heartbeat and a body temperature that is too high or too low. You may be itchy or in pain.  Sebopsoriasis. Symptoms include red patches on your scalp, forehead, and face that are greasy.  Psoriatic arthritis. Symptoms include swollen, painful joints along with scaly skin patches. How is this treated? There is no cure for this condition, but treatment can:  Help your skin heal.  Lessen itching and irritation and swelling (inflammation).  Slow the growth of new skin cells.  Help your body's defense system respond better to your skin. Treatment may include:  Creams or ointments.  Light therapy. This may include natural sunlight or light therapy in a doctor's office.  Medicines. These can help your body better manage skin cells. They may be used with light therapy or ointments. Medicines may include pills or injections. You may also get antibiotic medicines if you have an infection. Follow these instructions at home: Skin Care  Apply lotion to your skin as needed. Only use those that your doctor has said are okay.  Apply cool, wet cloths (cold compresses) to the affected areas.  Do not use a hot tub or take hot showers. Use slightly warm, not hot, water when taking showers and baths.  Do not scratch your skin. Lifestyle   Do not use any products that contain nicotine or tobacco, such as cigarettes, e-cigarettes, and chewing tobacco. If you need help quitting, ask your doctor.  Lower your  stress.  Keep a healthy weight.  Go out in the sun as told by your doctor. Do not get sunburned.  Join a support group. Medicines  Take or use over-the-counter and prescription medicines only as told by your doctor.  If you were prescribed an antibiotic medicine, take it as told by your doctor. Do not stop using the antibiotic even if you start to feel better. Alcohol use If you drink alcohol:  Limit how much you use: ? 0-1 drink a day for women. ? 0-2 drinks a day for men.  Be aware of how much alcohol is in your drink. In the [place] one drink equals one 12 oz bottle of beer (355 mL), one 5 oz glass of wine (148 mL), or one 1 oz glass of hard liquor (44 mL). General instructions  Keep a journal to track the things that cause symptoms (triggers). Try to avoid these things.  See a counselor if you feel the support would help.  Keep all follow-up visits as told by your doctor. This is important. Contact a doctor if:  You have a fever.  Your pain gets worse.  You have more redness or warmth in the affected areas.  You have new or worse pain or stiffness  in your joints.  Your nails start to break easily or pull away from the nail bed.  You feel very sad (depressed). Summary  Psoriasis is a long-term (chronic) skin condition.  There is no cure for this condition, but treatment can help manage it.  Keep a journal to track the things that cause symptoms.  Take or use over-the-counter and prescription medicines only as told by your doctor.  Keep all follow-up visits as told by your doctor. This is important. This information is not intended to replace advice given to you by your health care provider. Make sure you discuss any questions you have with your health care provider. Document Revised: 04/30/2018 Document Reviewed: 04/30/2018 Elsevier Patient Education  2020 [employer]      Agustina Caroli, MD [facility]

## 2019-10-11 ENCOUNTER — Ambulatory Visit
Admission: EM | Admit: 2019-10-11 | Discharge: 2019-10-11 | Disposition: A | Discharge status: Home / Self Care | Payer: 59 | Attending: Emergency Medicine | Admitting: Emergency Medicine

## 2019-10-11 ENCOUNTER — Other Ambulatory Visit: Payer: Self-pay

## 2019-10-11 DIAGNOSIS — Z5189 Encounter for other specified aftercare: Secondary | ICD-10-CM

## 2019-10-11 DIAGNOSIS — L409 Psoriasis, unspecified: Secondary | ICD-10-CM

## 2019-10-11 DIAGNOSIS — L03115 Cellulitis of right lower limb: Secondary | ICD-10-CM

## 2019-10-11 MED ORDER — DOXYCYCLINE HYCLATE 100 MG PO CAPS: 100.0000 mg | ORAL_CAPSULE | Freq: Two times a day (BID) | ORAL | 0 refills | Status: DC

## 2019-10-11 NOTE — ED Provider Notes (Signed)
[place]   [id number] 10/11/19 Arrival Time: 5638  CC: Wound check  SUBJECTIVE:  Theresa Sawyer is a 39 y.o. female who presents for wound check.  Hx significant for psoriasis and states a sore has cracked open on her RLE a few days ago.  Wanted to make sure it was not infected.  Localizes the wound to RLE.  Describes it as painful and red.  States it has gotten worse.  Has tried cleaning with alcohol without relief.  Symptoms are made worse to the touch.  Denies fever, chills, nausea, vomiting, SOB, chest pain, abdominal pain, changes in bowel or bladder function.    Has appt with derm this upcoming Monday  ROS: As per HPI.  All other pertinent ROS negative.     Past Medical History:  Diagnosis Date  . Anxiety   . GERD (gastroesophageal reflux disease)   . High cholesterol   . Psoriasis   . Sterilization consult 01/29/2013   BTL in hosp desired. Papers signed 12/16/12   . SVD (spontaneous vaginal delivery) 02/06/2013   Past Surgical History:  Procedure Laterality Date  . TUBAL LIGATION Bilateral 02/05/2013   Procedure: POST PARTUM TUBAL LIGATION;  Surgeon: Woodroe Mode, MD;  Location: [facility];  Service: Gynecology;  Laterality: Bilateral;   No Known Allergies No current facility-administered medications on file prior to encounter.   Current Outpatient Medications on File Prior to Encounter  Medication Sig Dispense Refill  . ALPRAZolam (XANAX) 0.5 MG tablet TAKE 1 TABLET(0.5 MG) BY MOUTH DAILY AS NEEDED FOR ANXIETY 30 tablet 0  . Apremilast (OTEZLA) 10 & 20 & 30 MG TBPK Day 1: 10 mg in the morning.  Day 2: 10 mg in the morning and 10 mg in the evening.  Day 3: 10 mg in the morning and 20 mg in the evening.  Day 4: 20 mg in the morning and 20 mg in the evening.  Day 5: 20 mg in the morning and 30 mg in the evening.  Day 6 and thereafter: 30 mg twice daily. 55 each 5  . aspirin EC 81 MG tablet Take 81 mg by mouth daily as needed for mild pain.     .  Aspirin-Acetaminophen-Caffeine (GOODYS EXTRA STRENGTH PO) Take 1 packet by mouth as needed.     Marland Kitchen augmented betamethasone dipropionate (DIPROLENE AF) 0.05 % cream Apply topically 2 (two) times daily. 50 g 3  . cetirizine (ZYRTEC) 10 MG tablet Take 1 tablet (10 mg total) by mouth daily. (Patient not taking: Reported on 10/02/2019) 30 tablet 0  . escitalopram (LEXAPRO) 20 MG tablet Take 1 tablet (20 mg total) by mouth daily. 90 tablet 3  . famotidine (PEPCID) 40 MG tablet TAKE 1 TABLET(40 MG) BY MOUTH TWICE DAILY 60 tablet 3  . Ferrous Sulfate (IRON PO) Take 65 mg by mouth daily.    . megestrol (MEGACE) 40 MG tablet Take 1 tablet (40 mg total) by mouth 3 (three) times daily. Until bleeding stops, then take one tablet daily until you see your gynecologist 30 tablet 0  . megestrol (MEGACE) 40 MG tablet Take one tab po twice daily until bleeding stops then one tab po daily until surgery. 60 tablet 0  . Multiple Vitamins-Minerals (MULTIVITAMIN WITH MINERALS) tablet Take 1 tablet by mouth daily.    Marland Kitchen omeprazole (PRILOSEC) 40 MG capsule TAKE 1 CAPSULE(40 MG) BY MOUTH DAILY 30 capsule 3   Social History   Socioeconomic History  . Marital status: Legally Separated  Spouse name: Not on file  . Number of children: Not on file  . Years of education: Not on file  . Highest education level: Not on file  Occupational History  . Not on file  Tobacco Use  . Smoking status: Current Some Day Smoker    Packs/day: 0.50    Types: Cigarettes  . Smokeless tobacco: Never Used  Substance and Sexual Activity  . Alcohol use: No  . Drug use: No  . Sexual activity: Yes    Birth control/protection: Surgical  Other Topics Concern  . Not on file  Social History Narrative  . Not on file   Social Determinants of Health   Financial Resource Strain:   . Difficulty of Paying Living Expenses:   Food Insecurity:   . Worried About Charity fundraiser in the Last Year:   . Arboriculturist in the Last Year:     Transportation Needs:   . Film/video editor (Medical):   Marland Kitchen Lack of Transportation (Non-Medical):   Physical Activity:   . Days of Exercise per Week:   . Minutes of Exercise per Session:   Stress:   . Feeling of Stress :   Social Connections:   . Frequency of Communication with Friends and Family:   . Frequency of Social Gatherings with Friends and Family:   . Attends Religious Services:   . Active Member of Clubs or Organizations:   . Attends Archivist Meetings:   Marland Kitchen Marital Status:   Intimate Partner Violence:   . Fear of Current or Ex-Partner:   . Emotionally Abused:   Marland Kitchen Physically Abused:   . Sexually Abused:    Family History  Problem Relation Age of Onset  . Diabetes Maternal Grandmother   . Diabetes Paternal Grandmother   . Anxiety disorder Mother   . Heart attack Brother 34       deceased from this  . Anxiety disorder Brother   . Cirrhosis Maternal Grandfather        alcohol related  . Colon cancer Neg Hx   . Esophageal cancer Neg Hx   . Colon polyps Neg Hx   . Stomach cancer Neg Hx   . Pancreatic cancer Neg Hx     OBJECTIVE: Vitals:   10/11/19 1001  BP: (!) 144/86  Pulse: 87  Resp: 20  Temp: 98.1 F (36.7 C)  SpO2: 98%    General appearance: alert; no distress Head: NCAT Lungs: normal respiratory effort CV: Dorsalis pedis pulse 2+ Extremities: no edema Skin: warm and dry; RT LE with 3-4 cm linear wound with dry blood localized to distal lateral aspect of RLE, surrounding white plaques and erythema present, TTP, no active bleeding or drainage Psychological: alert and cooperative; normal mood and affect  ASSESSMENT & PLAN:  1. Visit for wound check   2. Psoriasis   3. Cellulitis of leg, right     Meds ordered this encounter  Medications  . doxycycline (VIBRAMYCIN) 100 MG capsule    Sig: Take 1 capsule (100 mg total) by mouth 2 (two) times daily.    Dispense:  20 capsule    Refill:  0    Order Specific Question:   Supervising  Provider    Answer:   Raylene Everts [[id number]]   Wash with warm water and mild soap Prescribed doxycycline take as directed and to completion Dressing applied Continue to alternate ibuprofen and tylenol as needed for pain and fever Follow up with dermatology this  week for recheck Return or go to the ED if you have any new or worsening symptoms such as increased pain, redness, swelling, discharge, high fever, night sweats, abdominal pain, etc...   Reviewed expectations re: course of current medical issues. Questions answered. Outlined signs and symptoms indicating need for more acute intervention. Patient verbalized understanding. After Visit Summary given.   Lestine Box, PA-C 10/11/19 1006

## 2019-10-11 NOTE — ED Triage Notes (Signed)
Pt has psoriasis on leg  That has split open and concerned if infected

## 2019-10-11 NOTE — Discharge Instructions (Signed)
Wash with warm water and mild soap Prescribed doxycycline take as directed and to completion Dressing applied Continue to alternate ibuprofen and tylenol as needed for pain and fever Follow up with dermatology this week for recheck Return or go to the ED if you have any new or worsening symptoms such as increased pain, redness, swelling, discharge, high fever, night sweats, abdominal pain, etc..Marland Kitchen

## 2019-10-13 ENCOUNTER — Encounter: Payer: Self-pay | Admitting: Rheumatology

## 2019-10-15 ENCOUNTER — Other Ambulatory Visit: Payer: Self-pay

## 2019-10-15 ENCOUNTER — Encounter: Payer: Self-pay | Admitting: Gynecology

## 2019-10-16 ENCOUNTER — Encounter: Payer: Self-pay | Admitting: Obstetrics and Gynecology

## 2019-10-16 ENCOUNTER — Ambulatory Visit (INDEPENDENT_AMBULATORY_CARE_PROVIDER_SITE_OTHER): Payer: 59 | Admitting: Obstetrics and Gynecology

## 2019-10-16 VITALS — BP 124/80

## 2019-10-16 DIAGNOSIS — Z862 Personal history of diseases of the blood and blood-forming organs and certain disorders involving the immune mechanism: Secondary | ICD-10-CM | POA: Diagnosis not present

## 2019-10-16 DIAGNOSIS — N939 Abnormal uterine and vaginal bleeding, unspecified: Secondary | ICD-10-CM | POA: Diagnosis not present

## 2019-10-16 DIAGNOSIS — N840 Polyp of corpus uteri: Secondary | ICD-10-CM

## 2019-10-16 DIAGNOSIS — R9389 Abnormal findings on diagnostic imaging of other specified body structures: Secondary | ICD-10-CM

## 2019-10-16 DIAGNOSIS — N920 Excessive and frequent menstruation with regular cycle: Secondary | ICD-10-CM | POA: Diagnosis not present

## 2019-10-16 NOTE — Progress Notes (Signed)
   Theresa Sawyer  06-14-1981 [id number]  HPI The patient is a 39 y.o. [id number] who presents today for a preoperative encounter prior to the planned hysteroscopy D&C on 10/24/2019 for AUB/menorrhagia with suspected polyps based on 08/26/2019 pelvic ultrasound imaging (please see the previous note for details).  Continues to take Megace and her bleeding has slowed.  Not currently having any bleeding.  Past medical history,surgical history, problem list, medications, allergies, family history and social history were all reviewed and documented as reviewed in the EPIC chart.  Physical Exam  BP 124/80   LMP  (LMP Unknown)   General: Pleasant female, no acute distress, alert and oriented PELVIC EXAM: Deferred to the OR  Assessment 39 yo G3P1 with AUB/menorrhagia and suspected endometrial polyps  Plan We will plan to proceed with hysteroscopy D&C as scheduled.  She can stop taking Megace after her last dose on Thursday before the procedure.  Depending on the state of menstrual bleeding, she may need to resume after the procedure. I discussed the same-day outpatient intent of the procedure, and risks of infection, bleeding, possible need for blood transfusion, perforation of uterus and/or cervix resulting in injury to surrounding organ structures including major pelvic blood vessels, bowel, bladder, and potentially ureter, and DVT.  Anesthesia will either be general or MAC depending on the anesthesiologist's choice, and inherent risks with being placed under anesthesia include myocardial infarction, stroke, rarely death.  Very rarely would laparoscopy and/or laparotomy be indicated to tend to any intra-abdominal hemorrhage and/or injury concern.  Postoperative recovery expectations of needing a few days away from employment duties in the absence of any complication were also discussed.  All questions were answered by the end of today's visit.   Joseph Pierini MD, FACOG 10/16/19

## 2019-10-20 ENCOUNTER — Encounter (HOSPITAL_BASED_OUTPATIENT_CLINIC_OR_DEPARTMENT_OTHER): Payer: Self-pay | Admitting: Obstetrics and Gynecology

## 2019-10-20 ENCOUNTER — Other Ambulatory Visit: Payer: Self-pay

## 2019-10-20 NOTE — Progress Notes (Signed)
Spoke w/ via phone for pre-op interview--- PT Lab needs dos---- Urine preg             Lab results------ no COVID test ------ 10-21-2019 @ 1455 Arrive at ------- 0530 NPO after ------  MN Medications to take morning of surgery ----- Prilosec w/ sips of water Diabetic medication ----- n/a Patient Special Instructions ----- asked to bring cpap/ mask/ tubing with her dos Pre-Op special Istructions ----- n/a Patient verbalized understanding of instructions that were given at this phone interview. Patient denies shortness of breath, chest pain, fever, cough a this phone interview.   Anesthesia :  Hx updated in epic  PCP:   Cardiologist :  Consult w/ dr Irish Lack 04-23-2018 office note in epic for multiple Ed visits for CP. Atypical, follow up as needed (per pt denies any cardiac s&s) Chest x-ray : 08-05-2019 epic EKG : 08-05-2019 epic Echo : no Stress test:  no Cardiac Cath :  no Sleep Study/ CPAP : Yes/ Yes, pt states uses every night Fasting Blood Sugar :      / Checks Blood Sugar -- times a day:  n/a Blood Thinner/ Instructions /Last Dose: NO ASA / Instructions/ Last Dose : as needed asa 73m

## 2019-10-21 ENCOUNTER — Other Ambulatory Visit (HOSPITAL_COMMUNITY)
Admission: RE | Admit: 2019-10-21 | Discharge: 2019-10-21 | Disposition: A | Discharge status: Home / Self Care | Payer: 59 | Source: Ambulatory Visit | Attending: Obstetrics and Gynecology | Admitting: Obstetrics and Gynecology

## 2019-10-21 DIAGNOSIS — Z01812 Encounter for preprocedural laboratory examination: Secondary | ICD-10-CM | POA: Insufficient documentation

## 2019-10-21 DIAGNOSIS — Z20822 Contact with and (suspected) exposure to covid-19: Secondary | ICD-10-CM | POA: Insufficient documentation

## 2019-10-21 LAB — SARS CORONAVIRUS 2 (TAT 6-24 HRS): SARS Coronavirus 2: NEGATIVE

## 2019-10-21 NOTE — Progress Notes (Signed)
Spoke with Juliann Pulse at dr Delilah Shan office type and screen not needed day of surgery order d/c in epic

## 2019-10-23 NOTE — Anesthesia Preprocedure Evaluation (Addendum)
Anesthesia Evaluation  Patient identified by MRN, date of birth, ID band Patient awake    Reviewed: Allergy & Precautions, NPO status , Patient's Chart, lab work & pertinent test results  Airway Mallampati: II  TM Distance: >3 FB Neck ROM: Full    Dental no notable dental hx. (+) Teeth Intact, Dental Advisory Given   Pulmonary sleep apnea and Continuous Positive Airway Pressure Ventilation , Current SmokerPatient did not abstain from smoking.,    Pulmonary exam normal breath sounds clear to auscultation       Cardiovascular hypertension, Normal cardiovascular exam Rhythm:Regular Rate:Normal     Neuro/Psych PSYCHIATRIC DISORDERS Anxiety negative neurological ROS     GI/Hepatic Neg liver ROS, GERD  Medicated,  Endo/Other  Morbid obesity (BMI 40)  Renal/GU negative Renal ROS  negative genitourinary   Musculoskeletal  (+) Arthritis ,   Abdominal   Peds  Hematology negative hematology ROS (+)   Anesthesia Other Findings   Reproductive/Obstetrics                           Anesthesia Physical Anesthesia Plan  ASA: III  Anesthesia Plan: General   Post-op Pain Management:    Induction: Intravenous  PONV Risk Score and Plan: 2 and Ondansetron, Dexamethasone and Midazolam  Airway Management Planned: LMA  Additional Equipment:   Intra-op Plan:   Post-operative Plan: Extubation in OR  Informed Consent: I have reviewed the patients History and Physical, chart, labs and discussed the procedure including the risks, benefits and alternatives for the proposed anesthesia with the patient or authorized representative who has indicated his/her understanding and acceptance.     Dental advisory given  Plan Discussed with: CRNA  Anesthesia Plan Comments:         Anesthesia Quick Evaluation

## 2019-10-24 ENCOUNTER — Encounter (HOSPITAL_BASED_OUTPATIENT_CLINIC_OR_DEPARTMENT_OTHER)
Admission: RE | Disposition: A | Discharge status: Home / Self Care | Payer: Self-pay | Source: Home / Self Care | Attending: Obstetrics and Gynecology

## 2019-10-24 ENCOUNTER — Other Ambulatory Visit: Payer: Self-pay

## 2019-10-24 ENCOUNTER — Encounter (HOSPITAL_BASED_OUTPATIENT_CLINIC_OR_DEPARTMENT_OTHER): Payer: Self-pay | Admitting: Obstetrics and Gynecology

## 2019-10-24 ENCOUNTER — Ambulatory Visit (HOSPITAL_BASED_OUTPATIENT_CLINIC_OR_DEPARTMENT_OTHER): Payer: 59 | Admitting: Anesthesiology

## 2019-10-24 ENCOUNTER — Ambulatory Visit (HOSPITAL_BASED_OUTPATIENT_CLINIC_OR_DEPARTMENT_OTHER)
Admission: RE | Admit: 2019-10-24 | Discharge: 2019-10-24 | Disposition: A | Discharge status: Home / Self Care | Payer: 59 | Attending: Obstetrics and Gynecology | Admitting: Obstetrics and Gynecology

## 2019-10-24 DIAGNOSIS — D509 Iron deficiency anemia, unspecified: Secondary | ICD-10-CM | POA: Diagnosis not present

## 2019-10-24 DIAGNOSIS — N939 Abnormal uterine and vaginal bleeding, unspecified: Secondary | ICD-10-CM | POA: Diagnosis present

## 2019-10-24 DIAGNOSIS — N920 Excessive and frequent menstruation with regular cycle: Secondary | ICD-10-CM | POA: Diagnosis not present

## 2019-10-24 DIAGNOSIS — Z6841 Body Mass Index (BMI) 40.0 and over, adult: Secondary | ICD-10-CM | POA: Diagnosis not present

## 2019-10-24 DIAGNOSIS — M1990 Unspecified osteoarthritis, unspecified site: Secondary | ICD-10-CM | POA: Diagnosis not present

## 2019-10-24 DIAGNOSIS — N840 Polyp of corpus uteri: Secondary | ICD-10-CM

## 2019-10-24 DIAGNOSIS — G4733 Obstructive sleep apnea (adult) (pediatric): Secondary | ICD-10-CM | POA: Diagnosis not present

## 2019-10-24 DIAGNOSIS — F17210 Nicotine dependence, cigarettes, uncomplicated: Secondary | ICD-10-CM | POA: Insufficient documentation

## 2019-10-24 HISTORY — PX: DILATATION & CURETTAGE/HYSTEROSCOPY WITH MYOSURE: SHX6511

## 2019-10-24 HISTORY — DX: Personal history of other specified conditions: Z87.898

## 2019-10-24 HISTORY — DX: Obstructive sleep apnea (adult) (pediatric): G47.33

## 2019-10-24 HISTORY — DX: Abnormal uterine and vaginal bleeding, unspecified: N93.9

## 2019-10-24 HISTORY — DX: Hyperlipidemia, unspecified: E78.5

## 2019-10-24 HISTORY — DX: Iron deficiency anemia, unspecified: D50.9

## 2019-10-24 HISTORY — DX: Obstructive sleep apnea (adult) (pediatric): Z99.89

## 2019-10-24 LAB — POCT PREGNANCY, URINE: Preg Test, Ur: NEGATIVE

## 2019-10-24 SURGERY — DILATATION & CURETTAGE/HYSTEROSCOPY WITH MYOSURE
Anesthesia: General | Site: Vagina

## 2019-10-24 MED ORDER — IBUPROFEN 200 MG PO TABS: 200.0000 mg | ORAL_TABLET | Freq: Four times a day (QID) | ORAL | 2 refills | Status: DC

## 2019-10-24 MED ORDER — KETOROLAC TROMETHAMINE 30 MG/ML IJ SOLN
30.0000 mg | Freq: Four times a day (QID) | INTRAMUSCULAR | Status: DC
  Filled 2019-10-24: qty 1

## 2019-10-24 MED ORDER — FENTANYL CITRATE (PF) 100 MCG/2ML IJ SOLN
INTRAMUSCULAR | Status: DC | PRN
  Administered 2019-10-24: 50 ug via INTRAVENOUS
  Administered 2019-10-24 (×2): 25 ug via INTRAVENOUS

## 2019-10-24 MED ORDER — OXYCODONE HCL 5 MG PO TABS
5.0000 mg | ORAL_TABLET | Freq: Once | ORAL | Status: AC
  Administered 2019-10-24: 5 mg via ORAL
  Filled 2019-10-24: qty 1

## 2019-10-24 MED ORDER — LIDOCAINE 2% (20 MG/ML) 5 ML SYRINGE
INTRAMUSCULAR | Status: DC | PRN
  Administered 2019-10-24: 80 mg via INTRAVENOUS

## 2019-10-24 MED ORDER — ACETAMINOPHEN 325 MG PO TABS
650.0000 mg | ORAL_TABLET | ORAL | Status: DC | PRN
  Filled 2019-10-24: qty 2

## 2019-10-24 MED ORDER — LACTATED RINGERS IV SOLN
INTRAVENOUS | Status: DC
  Filled 2019-10-24: qty 1000

## 2019-10-24 MED ORDER — BUPIVACAINE HCL 0.25 % IJ SOLN
10.0000 mL | Freq: Once | INTRAMUSCULAR | Status: AC
  Administered 2019-10-24: 10 mL
  Filled 2019-10-24: qty 10

## 2019-10-24 MED ORDER — OXYCODONE HCL 5 MG PO TABS
ORAL_TABLET | ORAL | Status: AC
  Filled 2019-10-24: qty 1

## 2019-10-24 MED ORDER — ONDANSETRON HCL 4 MG/2ML IJ SOLN
INTRAMUSCULAR | Status: DC | PRN
  Administered 2019-10-24: 4 mg via INTRAVENOUS

## 2019-10-24 MED ORDER — MIDAZOLAM HCL 2 MG/2ML IJ SOLN
INTRAMUSCULAR | Status: AC
  Filled 2019-10-24: qty 2

## 2019-10-24 MED ORDER — ACETAMINOPHEN 500 MG PO TABS: 1000.0000 mg | ORAL_TABLET | Freq: Four times a day (QID) | ORAL | 2 refills | Status: DC

## 2019-10-24 MED ORDER — ONDANSETRON HCL 4 MG/2ML IJ SOLN
INTRAMUSCULAR | Status: AC
  Filled 2019-10-24: qty 2

## 2019-10-24 MED ORDER — KETOROLAC TROMETHAMINE 30 MG/ML IJ SOLN
INTRAMUSCULAR | Status: AC
  Filled 2019-10-24: qty 1

## 2019-10-24 MED ORDER — DEXAMETHASONE SODIUM PHOSPHATE 10 MG/ML IJ SOLN
INTRAMUSCULAR | Status: AC
  Filled 2019-10-24: qty 1

## 2019-10-24 MED ORDER — ACETAMINOPHEN 500 MG PO TABS
1000.0000 mg | ORAL_TABLET | Freq: Once | ORAL | Status: AC
  Administered 2019-10-24: 1000 mg via ORAL
  Filled 2019-10-24: qty 2

## 2019-10-24 MED ORDER — DEXAMETHASONE SODIUM PHOSPHATE 10 MG/ML IJ SOLN
INTRAMUSCULAR | Status: DC | PRN
  Administered 2019-10-24: 10 mg via INTRAVENOUS

## 2019-10-24 MED ORDER — LIDOCAINE 2% (20 MG/ML) 5 ML SYRINGE
INTRAMUSCULAR | Status: AC
  Filled 2019-10-24: qty 5

## 2019-10-24 MED ORDER — PROPOFOL 10 MG/ML IV BOLUS
INTRAVENOUS | Status: DC | PRN
  Administered 2019-10-24: 180 mg via INTRAVENOUS

## 2019-10-24 MED ORDER — OXYCODONE HCL 5 MG PO TABS: 5.0000 mg | ORAL_TABLET | Freq: Four times a day (QID) | ORAL | 0 refills | Status: DC | PRN

## 2019-10-24 MED ORDER — WHITE PETROLATUM EX OINT
TOPICAL_OINTMENT | CUTANEOUS | Status: AC
  Filled 2019-10-24: qty 5

## 2019-10-24 MED ORDER — OXYCODONE HCL 5 MG PO TABS
5.0000 mg | ORAL_TABLET | ORAL | Status: DC | PRN
  Filled 2019-10-24: qty 2

## 2019-10-24 MED ORDER — SODIUM CHLORIDE 0.9 % IV SOLN
250.0000 mL | INTRAVENOUS | Status: DC | PRN
  Filled 2019-10-24: qty 250

## 2019-10-24 MED ORDER — PROPOFOL 10 MG/ML IV BOLUS
INTRAVENOUS | Status: AC
  Filled 2019-10-24: qty 20

## 2019-10-24 MED ORDER — ACETAMINOPHEN 500 MG PO TABS
ORAL_TABLET | ORAL | Status: AC
  Filled 2019-10-24: qty 2

## 2019-10-24 MED ORDER — FENTANYL CITRATE (PF) 100 MCG/2ML IJ SOLN
INTRAMUSCULAR | Status: AC
  Filled 2019-10-24: qty 2

## 2019-10-24 MED ORDER — LACTATED RINGERS IV SOLN
INTRAVENOUS | Status: DC
  Administered 2019-10-24: 75 mL/h via INTRAVENOUS
  Filled 2019-10-24: qty 1000

## 2019-10-24 MED ORDER — SODIUM CHLORIDE 0.9% FLUSH
3.0000 mL | INTRAVENOUS | Status: DC | PRN
  Filled 2019-10-24: qty 3

## 2019-10-24 MED ORDER — SODIUM CHLORIDE 0.9 % IR SOLN
Status: DC | PRN
  Administered 2019-10-24: 3000 mL

## 2019-10-24 MED ORDER — KETOROLAC TROMETHAMINE 30 MG/ML IJ SOLN
INTRAMUSCULAR | Status: DC | PRN
  Administered 2019-10-24: 30 mg via INTRAVENOUS

## 2019-10-24 MED ORDER — ACETAMINOPHEN 650 MG RE SUPP
650.0000 mg | RECTAL | Status: DC | PRN
  Filled 2019-10-24: qty 1

## 2019-10-24 MED ORDER — MIDAZOLAM HCL 5 MG/5ML IJ SOLN
INTRAMUSCULAR | Status: DC | PRN
  Administered 2019-10-24: 2 mg via INTRAVENOUS

## 2019-10-24 MED ORDER — SCOPOLAMINE 1 MG/3DAYS TD PT72
MEDICATED_PATCH | TRANSDERMAL | Status: AC
  Filled 2019-10-24: qty 1

## 2019-10-24 MED ORDER — SODIUM CHLORIDE 0.9% FLUSH
3.0000 mL | Freq: Two times a day (BID) | INTRAVENOUS | Status: DC
  Filled 2019-10-24: qty 3

## 2019-10-24 MED ORDER — FENTANYL CITRATE (PF) 100 MCG/2ML IJ SOLN
25.0000 ug | INTRAMUSCULAR | Status: DC | PRN
  Administered 2019-10-24 (×3): 25 ug via INTRAVENOUS
  Filled 2019-10-24: qty 1

## 2019-10-24 SURGICAL SUPPLY — 18 items
CATH ROBINSON RED A/P 16FR (CATHETERS) ×2 IMPLANT
COVER WAND RF STERILE (DRAPES) ×2 IMPLANT
DEVICE MYOSURE LITE (MISCELLANEOUS) ×2 IMPLANT
GAUZE 4X4 16PLY RFD (DISPOSABLE) ×2 IMPLANT
GLOVE BIO SURGEON STRL SZ8 (GLOVE) ×2 IMPLANT
GLOVE BIOGEL PI IND STRL 8 (GLOVE) ×2 IMPLANT
GLOVE BIOGEL PI IND STRL 8.5 (GLOVE) ×2 IMPLANT
GLOVE BIOGEL PI INDICATOR 8 (GLOVE) ×2
GLOVE BIOGEL PI INDICATOR 8.5 (GLOVE) ×2
GLOVE SURG SS PI 8.5 STRL IVOR (GLOVE) ×2
GLOVE SURG SS PI 8.5 STRL STRW (GLOVE) ×2 IMPLANT
GOWN STRL REUS W/TWL XL LVL3 (GOWN DISPOSABLE) ×6 IMPLANT
IV NS IRRIG 3000ML ARTHROMATIC (IV SOLUTION) ×2 IMPLANT
KIT PROCEDURE FLUENT (KITS) ×2 IMPLANT
PACK VAGINAL MINOR WOMEN LF (CUSTOM PROCEDURE TRAY) ×2 IMPLANT
PAD OB MATERNITY 4.3X12.25 (PERSONAL CARE ITEMS) ×2 IMPLANT
PAD PREP 24X48 CUFFED NSTRL (MISCELLANEOUS) ×2 IMPLANT
SEAL ROD LENS SCOPE MYOSURE (ABLATOR) ×2 IMPLANT

## 2019-10-24 NOTE — H&P (Signed)
Theresa Sawyer Jul 28, 1980 [id number]  SUBJECTIVE:  39 y.o. [id number] female for a scheduled hysteroscopy D&C with polypectomy for AUB/menorrhagia with suspected polyps based on 08/26/2019 pelvic ultrasound imaging, endometrium was significantly thick at 21 mm, and several endometrial polyps were likely present. She had required Megace for control of the heavy bleeding.  Current Facility-Administered Medications  Medication Dose Route Frequency Provider Last Rate Last Admin  . bupivacaine (MARCAINE) 0.25 % (with pres) injection 10 mL  10 mL Infiltration Once Joseph Pierini, MD      . lactated ringers infusion   Intravenous Continuous Joseph Pierini, MD 75 mL/hr at 10/24/19 0619 75 mL/hr at 10/24/19 8469   Allergies: Patient has no known allergies.  No LMP recorded (lmp unknown).  Past Medical History:  Diagnosis Date  . Abnormal uterine bleeding (AUB)   . Anxiety   . GERD (gastroesophageal reflux disease)   . History of chest pain (10-20-2019  pt denies any cardiac s&s)   mutliple ED visits, atypical chest pain, chest wall pain, muscularskeletal chest pain;  pt had cardiology evaulation dated 04-23-2018 note in epic , state atypical chest wall pain with negative d dimer and troponin's multiple times in epic, suggested CTA  (pt did not get done)  . Hyperlipidemia   . IDA (iron deficiency anemia)   . OSA on CPAP    followed by dr Dia Sitter  . Psoriasis   . Psoriatic arthritis [facility])    Past Surgical History:  Procedure Laterality Date  . TUBAL LIGATION Bilateral 02/05/2013   Procedure: POST PARTUM TUBAL LIGATION;  Surgeon: Woodroe Mode, MD;  Location: [facility];  Service: Gynecology;  Laterality: Bilateral;    Filshie clips   Social History   Tobacco Use  . Smoking status: Current Some Day Smoker    Packs/day: 0.50    Years: 20.00    Pack years: 10.00    Types: Cigarettes  . Smokeless tobacco: Never Used  Substance Use Topics  . Alcohol use: No  . Drug use: Never   Family History   Problem Relation Age of Onset  . Diabetes Maternal Grandmother   . Diabetes Paternal Grandmother   . Anxiety disorder Mother   . Heart attack Brother 34       deceased from this  . Anxiety disorder Brother   . Cirrhosis Maternal Grandfather        alcohol related  . Colon cancer Neg Hx   . Esophageal cancer Neg Hx   . Colon polyps Neg Hx   . Stomach cancer Neg Hx   . Pancreatic cancer Neg Hx     ROS:  Feeling well. No dyspnea or chest pain on exertion.  No abdominal pain, change in bowel habits, black or bloody stools.  No urinary tract symptoms. GYN ROS: heavy menses, + abnormal bleeding, pelvic pain or discharge. No neurological complaints.    OBJECTIVE:  BP (!) 131/95   Pulse 78   Temp 99 F (37.2 C) (Oral)   Resp 18   Ht 5' 5"  (1.651 m)   Wt 109.7 kg   LMP  (LMP Unknown) Comment: irregular  SpO2 99%   BMI 40.25 kg/m  The patient appears well, alert, oriented x 3, in no distress. ENT normal.  Neck supple. No cervical or supraclavicular adenopathy or thyromegaly.  Lungs are clear, good air entry, no wheezes, rhonchi or rales. S1 and S2 normal, no murmurs, regular rate and rhythm.  Abdomen soft without tenderness, guarding, mass or organomegaly.  Neurological  is normal, no focal findings.    ASSESSMENT:  39 y.o. [id number] here for hysteroscopy D&C with polypectomy possibly via Myosure as planned  PLAN:  Proceed with procedure as planned. Post op recovery expectations reviewed. All questions answered.   Joseph Pierini MD 10/24/19

## 2019-10-24 NOTE — Transfer of Care (Deleted)
Immediate Anesthesia Transfer of Care Note  Patient: Theresa Sawyer  Procedure(s) Performed: Procedure(s) (LRB): DILATATION & CURETTAGE/HYSTEROSCOPY WITH MYOSURE (N/A)  Patient Location: PACU  Anesthesia Type: General  Level of Consciousness: awake, alert  and oriented  Airway & Oxygen Therapy: Patient Spontanous Breathing and Patient connected to face mask oxygen  Post-op Assessment: Report given to PACU RN and Post -op Vital signs reviewed and stable  Post vital signs: Reviewed and stable  Complications: No apparent anesthesia complications Last Vitals:  Vitals Value Taken Time  BP 129/91 10/24/19 0823  Temp 36.1 C 10/24/19 0823  Pulse 73 10/24/19 0825  Resp 16 10/24/19 0825  SpO2 100 % 10/24/19 0825  Vitals shown include unvalidated device data.  Last Pain:  Vitals:   10/24/19 0602  TempSrc: Oral  PainSc: 0-No pain      Patients Stated Pain Goal: 5 (10/24/19 0602)

## 2019-10-24 NOTE — Op Note (Signed)
Name: Theresa Sawyer  Age: 39 y.o.  Date of Birth: 08-19-1980 Medical Record #: [id number]   Operative Note   Preoperative Diagnosis: Abnormal uterine bleeding, menorrhagia, suspected endometrial polyps Procedure: Hysteroscopy, Dilatation and Curettage, endometrial polypectomy with MyoSure Postoperative Diagnosis: same Surgeon: Joseph Pierini, MD Estimated Blood Loss: 5 mL Anesthesia: General LMA, local with 0.25% bupivacaine (10 mL) Specimens: endometrial curettings, endometrial polyp Findings: Initial entry into the endometrial cavity indicates grossly abnormal appearing tissue with trabeculation and webbing of the endometrial lining.  Several small polyps with vascularity are noted among this tissue.  Gross appearance of the tissue was consistent with at least endometrial hyperplasia.  Tubal ostia were initially obscured by overlying tissues but are visualized after curettage. Normal cavity contour without submucosal fibroids.  Endocervical canal normal. Uterus sounds to 9 cm.  Uterus feels normal in size and contour, mobile, slightly anteverted.  Hysteroscopic fluid deficit 320 mL.  Complication: none. Date: 10/24/19     DESCRIPTION OF PROCEDURE:      Preoperative review of the procedure was completed with the patient prior to transport to the operating room including risks, benefits, and alternatives.  The patient's questions were answered and she agreed to proceed.    Under anesthesia, LALIA LOUDON was placed in the dorsolithotomy position with legs in yellowfin stirrups with SCDs applied and running.  A surgical team time out was performed to verify and agree on procedure and patient consent. A bimanual exam was performed.  The patient was prepped and draped in the usual sterile fashion.      Cervix was visualized with a weighted speculum placed in the posterior vagina and a Sims retractor anteriorly.  A paracervical block was applied in the standard fashion using 0.25% Marcaine. The  anterior cervix was grasped with a single-toothed tenaculum. The uterine descensus was noted to be poor.  Cervix was gently dilated using a progressive series of Pratt dilators.  There was no concern for uterine or cervical perforation.  The uterine cavity was very gently sounded to establish a measurement of uterine cavity depth.  The MyoSure operative hysteroscope was introduced and the uterine cavity was visualized with findings as above.  The MyoSure Lite was inserted into the operative hysteroscope and endometrial tissue morcellation was performed along the endometrial cavity walls to help clear up the visualization of the cavity.  Tissue thickening and polyps were collected via the MyoSure with no concern for uterine perforation.  The tissue collected in this manner constituted the endometrial polyp specimen.  Bilateral fallopian tubal ostia were then able to be fully visualized.   A small serrated curette was used to perform a thorough curettage which was productive of a moderate amount of endometrial tissue.  The curettage was effective at achieving a uterine cry circumferentially.   The endometrial curettings were sent to pathology as a separate specimen from the endometrial polyp tissue for review.  Bleeding was minimal at that point.  The tenaculum was removed from the cervix and the puncture sites were hemostatic.   Sponge and instrument counts were correct at the conclusion of the procedure. The patient tolerated the procedure well and was brought to the recovery room in stable condition.      Joseph Pierini, M.D., Cherlynn June

## 2019-10-24 NOTE — Anesthesia Postprocedure Evaluation (Signed)
Anesthesia Post Note  Patient: Theresa Sawyer  Procedure(s) Performed: DILATATION & CURETTAGE/HYSTEROSCOPY WITH MYOSURE (N/A Vagina )     Patient location during evaluation: PACU Anesthesia Type: General Level of consciousness: awake and alert Pain management: pain level controlled Vital Signs Assessment: post-procedure vital signs reviewed and stable Respiratory status: spontaneous breathing, nonlabored ventilation, respiratory function stable and patient connected to nasal cannula oxygen Cardiovascular status: blood pressure returned to baseline and stable Postop Assessment: no apparent nausea or vomiting Anesthetic complications: no    Last Vitals:  Vitals:   10/24/19 0900 10/24/19 0915  BP: 128/71 116/66  Pulse: 71 69  Resp: 17 16  Temp:    SpO2: 95% 96%    Last Pain:  Vitals:   10/24/19 0930  TempSrc:   PainSc: 5                  Erina Hamme L Maryela Tapper

## 2019-10-24 NOTE — Discharge Instructions (Signed)

## 2019-10-24 NOTE — Anesthesia Procedure Notes (Signed)
Procedure Name: LMA Insertion Date/Time: 10/24/2019 7:42 AM Performed by: Evva Din D, CRNA Pre-anesthesia Checklist: Patient identified, Emergency Drugs available, Suction available and Patient being monitored Patient Re-evaluated:Patient Re-evaluated prior to induction Oxygen Delivery Method: Circle system utilized Preoxygenation: Pre-oxygenation with 100% oxygen Induction Type: IV induction Ventilation: Mask ventilation without difficulty LMA: LMA inserted LMA Size: 4.0 Tube type: Oral Number of attempts: 1 Placement Confirmation: positive ETCO2 and breath sounds checked- equal and bilateral Tube secured with: Tape Dental Injury: Teeth and Oropharynx as per pre-operative assessment

## 2019-10-24 NOTE — Transfer of Care (Signed)
Immediate Anesthesia Transfer of Care Note  Patient: Theresa Sawyer  Procedure(s) Performed: Procedure(s) (LRB): DILATATION & CURETTAGE/HYSTEROSCOPY WITH MYOSURE (N/A)  Patient Location: PACU  Anesthesia Type: General  Level of Consciousness: awake, oriented, sedated and patient cooperative  Airway & Oxygen Therapy: Patient Spontanous Breathing and Patient connected to face mask oxygen  Post-op Assessment: Report given to PACU RN and Post -op Vital signs reviewed and stable  Post vital signs: Reviewed and stable  Complications: No apparent anesthesia complications Last Vitals:  Vitals Value Taken Time  BP 129/91 10/24/19 0823  Temp 36.1 C 10/24/19 0823  Pulse 80 10/24/19 0827  Resp 15 10/24/19 0827  SpO2 100 % 10/24/19 0827  Vitals shown include unvalidated device data.  Last Pain:  Vitals:   10/24/19 0602  TempSrc: Oral  PainSc: 0-No pain      Patients Stated Pain Goal: 5 (10/24/19 0602)

## 2019-10-27 LAB — SURGICAL PATHOLOGY

## 2019-11-02 ENCOUNTER — Ambulatory Visit (INDEPENDENT_AMBULATORY_CARE_PROVIDER_SITE_OTHER): Payer: 59

## 2019-11-02 ENCOUNTER — Other Ambulatory Visit: Payer: Self-pay

## 2019-11-02 ENCOUNTER — Encounter: Payer: Self-pay | Admitting: Emergency Medicine

## 2019-11-02 ENCOUNTER — Ambulatory Visit
Admission: EM | Admit: 2019-11-02 | Discharge: 2019-11-02 | Disposition: A | Discharge status: Home / Self Care | Payer: 59 | Attending: Emergency Medicine | Admitting: Emergency Medicine

## 2019-11-02 DIAGNOSIS — M25571 Pain in right ankle and joints of right foot: Secondary | ICD-10-CM

## 2019-11-02 MED ORDER — IBUPROFEN 800 MG PO TABS: 800.0000 mg | ORAL_TABLET | Freq: Three times a day (TID) | ORAL | 0 refills | Status: DC

## 2019-11-02 MED ORDER — PREDNISONE 10 MG (21) PO TBPK: ORAL_TABLET | ORAL | 0 refills | Status: DC

## 2019-11-02 NOTE — Discharge Instructions (Addendum)
Your right ankle x-ray was negative for acute ab normality including fracture and dislocation. Prednisone was prescribed take as directed Ibuprofen 800 mg was prescribed Follow-up with PCP Return or go to ED for worsening of symptoms.

## 2019-11-02 NOTE — ED Triage Notes (Addendum)
Pt sts right ankle pain with swelling x 3 days; pt denies obvious injury but sts has hx of arthritis

## 2019-11-02 NOTE — ED Provider Notes (Signed)
[facility]    CSN: [id number] Arrival date & time: 11/02/19  6812      History   Chief Complaint Chief Complaint  Patient presents with   Ankle Pain    HPI Theresa Sawyer is a 39 y.o. female.   Who presented to the urgent care with a complaint of right ankle pain and swelling for the past 3 days denies any precipitating event.  Reports he has history of arthritis.  Localizes the pain to the right ankle.  He describes the pain as constant and achy, rated at 5 on a scale 1-10.  She has tried OTC medications without relief.  Her symptoms are made worse with ROM.  She denies similar symptoms in the past.  Denies chills, fever, nausea, vomiting, diarrhea.  The history is provided by the patient. No language interpreter was used.  Ankle Pain   Past Medical History:  Diagnosis Date   Abnormal uterine bleeding (AUB)    Anxiety    GERD (gastroesophageal reflux disease)    History of chest pain (10-20-2019  pt denies any cardiac s&s)   mutliple ED visits, atypical chest pain, chest wall pain, muscularskeletal chest pain;  pt had cardiology evaulation dated 04-23-2018 note in epic , state atypical chest wall pain with negative d dimer and troponin's multiple times in epic, suggested CTA  (pt did not get done)   Hyperlipidemia    IDA (iron deficiency anemia)    OSA on CPAP    followed by dr Dia Sitter   Psoriasis    Psoriatic arthritis [facility])     Patient Active Problem List   Diagnosis Date Noted   OSA on CPAP 04/24/2019   Psoriasis 12/05/2018   Situational anxiety 12/05/2018   Intermittent hypertension 12/05/2018    Past Surgical History:  Procedure Laterality Date   DILATATION & CURETTAGE/HYSTEROSCOPY WITH MYOSURE N/A 10/24/2019   Procedure: DILATATION & CURETTAGE/HYSTEROSCOPY WITH MYOSURE;  Surgeon: Joseph Pierini, MD;  Location: [place];  Service: Gynecology;  Laterality: N/A;   TUBAL LIGATION Bilateral 02/05/2013   Procedure: POST  PARTUM TUBAL LIGATION;  Surgeon: Woodroe Mode, MD;  Location: [facility];  Service: Gynecology;  Laterality: Bilateral;    Filshie clips    OB History    Gravida  3   Para  1   Term  1   Preterm  0   AB  2   Living  1     SAB  1   TAB  1   Ectopic  0   Multiple  0   Live Births  1            Home Medications    Prior to Admission medications   Medication Sig Start Date End Date Taking? Authorizing Provider  acetaminophen (TYLENOL) 500 MG tablet Take 2 tablets (1,000 mg total) by mouth every 6 (six) hours. Take for 5 days, then as needed 10/24/19 10/23/20  Joseph Pierini, MD  ALPRAZolam Duanne Moron) 0.5 MG tablet TAKE 1 TABLET(0.5 MG) BY MOUTH DAILY AS NEEDED FOR ANXIETY 09/11/19   Horald Pollen, MD  aspirin EC 81 MG tablet Take 81 mg by mouth daily as needed for mild pain.     [provider]  Aspirin-Acetaminophen-Caffeine (GOODYS EXTRA STRENGTH PO) Take 1 packet by mouth as needed.     [provider]  benzonatate (TESSALON) 100 MG capsule Take by mouth 3 (three) times daily as needed for cough.    [provider]  cetirizine (ZYRTEC) 10 MG tablet Take 1 tablet (10 mg total) by mouth daily. Patient taking differently: Take 10 mg by mouth daily as needed.  07/10/19   Wurst, [place], PA-C  doxycycline (VIBRAMYCIN) 100 MG capsule Take 1 capsule (100 mg total) by mouth 2 (two) times daily. Patient not taking: Reported on 11/02/2019 10/11/19   Wurst, [place], PA-C  escitalopram (LEXAPRO) 20 MG tablet Take 1 tablet (20 mg total) by mouth daily. Patient taking differently: Take 20 mg by mouth every evening.  03/13/19 10/20/19  Horald Pollen, MD  famotidine (PEPCID) 40 MG tablet TAKE 1 TABLET(40 MG) BY MOUTH TWICE DAILY 07/17/19   Thornton Park, MD  Ferrous Sulfate (IRON PO) Take 2 tablets by mouth every evening.     [provider]  ibuprofen (MOTRIN IB) 200 MG tablet Take 1 tablet (200 mg total) by mouth every 6 (six) hours. For 5  days, then as needed 10/24/19   Joseph Pierini, MD  megestrol (MEGACE) 40 MG tablet Take one tab po twice daily until bleeding stops then one tab po daily until surgery. Patient taking differently: as directed. Take one tab po twice daily until bleeding stops then one tab po daily until surgery. 09/12/19   Huel Cote, NP  Multiple Vitamins-Minerals (MULTIVITAMIN WITH MINERALS) tablet Take 1 tablet by mouth daily.    [provider]  omeprazole (PRILOSEC) 40 MG capsule TAKE 1 CAPSULE(40 MG) BY MOUTH DAILY Patient taking differently: Take 40 mg by mouth daily as needed.  07/17/19   Thornton Park, MD  oxyCODONE (ROXICODONE) 5 MG immediate release tablet Take 1 tablet (5 mg total) by mouth every 6 (six) hours as needed for severe pain. 10/24/19   Joseph Pierini, MD  triamcinolone cream (KENALOG) 0.1 % Apply 1 application topically 2 (two) times daily.    [provider]    Family History Family History  Problem Relation Age of Onset   Diabetes Maternal Grandmother    Diabetes Paternal Grandmother    Anxiety disorder Mother    Heart attack Brother 48       deceased from this   Anxiety disorder Brother    Cirrhosis Maternal Grandfather        alcohol related   Colon cancer Neg Hx    Esophageal cancer Neg Hx    Colon polyps Neg Hx    Stomach cancer Neg Hx    Pancreatic cancer Neg Hx     Social History Social History   Tobacco Use   Smoking status: Current Some Day Smoker    Packs/day: 0.50    Years: 20.00    Pack years: 10.00    Types: Cigarettes   Smokeless tobacco: Never Used  Substance Use Topics   Alcohol use: No   Drug use: Never     Allergies   Patient has no known allergies.   Review of Systems Review of Systems  Constitutional: Negative.   Respiratory: Negative.   Cardiovascular: Negative.   Musculoskeletal: Positive for arthralgias and joint swelling.  All other systems reviewed and are negative.    Physical  Exam Triage Vital Signs ED Triage Vitals  Enc Vitals Group     BP 11/02/19 0937 (!) 153/86     Pulse Rate 11/02/19 0937 88     Resp 11/02/19 0937 18     Temp 11/02/19 0937 98.4 F (36.9 C)     Temp Source 11/02/19 0937 Oral     SpO2 11/02/19 0937 98 %  Weight --      Height --      Head Circumference --      Peak Flow --      Pain Score 11/02/19 0938 5     Pain Loc --      Pain Edu? --      Excl. in [place]? --    No data found.  Updated Vital Signs BP (!) 153/86 (BP Location: Right Arm)    Pulse 88    Temp 98.4 F (36.9 C) (Oral)    Resp 18    LMP  (LMP Unknown)    SpO2 98%   Visual Acuity Right Eye Distance:   Left Eye Distance:   Bilateral Distance:    Right Eye Near:   Left Eye Near:    Bilateral Near:     Physical Exam Vitals and nursing note reviewed.  Constitutional:      General: She is not in acute distress.    Appearance: Normal appearance. She is normal weight. She is not ill-appearing, toxic-appearing or diaphoretic.  Cardiovascular:     Rate and Rhythm: Normal rate and regular rhythm.     Pulses: Normal pulses.     Heart sounds: Normal heart sounds. No murmur. No gallop.   Pulmonary:     Effort: Pulmonary effort is normal. No respiratory distress.     Breath sounds: Normal breath sounds. No stridor. No wheezing, rhonchi or rales.  Chest:     Chest wall: No tenderness.  Musculoskeletal:     Right ankle: Swelling present. Tenderness present.     Left ankle: Normal.     Comments: Patient is able to bear weight and ambulate with pain.  The right ankle is without any obvious asymmetry or deformity when compared to the left ankle.  No obvious surface trauma, ecchymosis warmth present.  There is swelling and tenderness.  Neurovascular status intact.  Neurological:     Mental Status: She is alert and oriented to person, place, and time.     Sensory: No sensory deficit.      UC Treatments / Results  Labs (all labs ordered are listed, but only abnormal  results are displayed) Labs Reviewed - No data to display  EKG   Radiology DG Ankle Complete Right  Result Date: 11/02/2019 CLINICAL DATA:  Right ankle pain for 3 days without injury. EXAM: RIGHT ANKLE - COMPLETE 3+ VIEW COMPARISON:  None. FINDINGS: Enthesopathic changes at the Achilles insertion site on the posterior calcaneus. Small plantar spur on the calcaneus. No fracture or dislocation. No other acute abnormalities. IMPRESSION: Enthesopathic changes at the Achilles insertion site. Small calcaneal plantar spur. No other abnormalities. Electronically Signed   By: Dorise Bullion III M.D   On: 11/02/2019 10:30    Procedures Procedures (including critical care time)  Medications Ordered in UC Medications - No data to display  Initial Impression / Assessment and Plan / UC Course  I have reviewed the triage vital signs and the nursing notes.  Pertinent labs & imaging results that were available during my care of the patient were reviewed by me and considered in my medical decision making (see chart for details).    Patient is stable at discharge.  X-ray is negative for bony abnormality including fracture or dislocation.  I have reviewed the x-ray myself and the radiologist interpretation.  I am in agreement with the radiologist interpretation.  Prednisone ibuprofen were prescribed. Was advised to follow PCP.  Final Clinical Impressions(s) / UC Diagnoses  Final diagnoses:  Acute right ankle pain     Discharge Instructions     Your right ankle x-ray was negative for acute ab normality including fracture and dislocation. Prednisone was prescribed take as directed Ibuprofen 800 mg was prescribed Follow-up with PCP Return or go to ED for worsening of symptoms.    ED Prescriptions    None     PDMP not reviewed this encounter.   Emerson Monte, [place] 11/02/19 1039

## 2019-11-04 ENCOUNTER — Other Ambulatory Visit: Payer: Self-pay

## 2019-11-06 ENCOUNTER — Ambulatory Visit (INDEPENDENT_AMBULATORY_CARE_PROVIDER_SITE_OTHER): Payer: 59 | Admitting: Obstetrics and Gynecology

## 2019-11-06 ENCOUNTER — Encounter: Payer: Self-pay | Admitting: Obstetrics and Gynecology

## 2019-11-06 ENCOUNTER — Other Ambulatory Visit: Payer: Self-pay

## 2019-11-06 VITALS — BP 134/84

## 2019-11-06 DIAGNOSIS — N939 Abnormal uterine and vaginal bleeding, unspecified: Secondary | ICD-10-CM | POA: Diagnosis not present

## 2019-11-06 DIAGNOSIS — Z862 Personal history of diseases of the blood and blood-forming organs and certain disorders involving the immune mechanism: Secondary | ICD-10-CM

## 2019-11-06 DIAGNOSIS — N920 Excessive and frequent menstruation with regular cycle: Secondary | ICD-10-CM

## 2019-11-06 DIAGNOSIS — R5383 Other fatigue: Secondary | ICD-10-CM

## 2019-11-06 LAB — CBC WITH DIFFERENTIAL/PLATELET
Absolute Monocytes: 969 cells/uL — ABNORMAL HIGH (ref 200–950)
Basophils Absolute: 183 cells/uL (ref 0–200)
Basophils Relative: 1.4 %
Eosinophils Absolute: 341 cells/uL (ref 15–500)
Eosinophils Relative: 2.6 %
HCT: 39.7 % (ref 35.0–45.0)
Hemoglobin: 12.6 g/dL (ref 11.7–15.5)
Lymphs Abs: 3458 cells/uL (ref 850–3900)
MCH: 24.8 pg — ABNORMAL LOW (ref 27.0–33.0)
MCHC: 31.7 g/dL — ABNORMAL LOW (ref 32.0–36.0)
MCV: 78 fL — ABNORMAL LOW (ref 80.0–100.0)
MPV: 9.3 fL (ref 7.5–12.5)
Monocytes Relative: 7.4 %
Neutro Abs: 8148 cells/uL — ABNORMAL HIGH (ref 1500–7800)
Neutrophils Relative %: 62.2 %
Platelets: 379 10*3/uL (ref 140–400)
RBC: 5.09 10*6/uL (ref 3.80–5.10)
RDW: 15 % (ref 11.0–15.0)
Total Lymphocyte: 26.4 %
WBC: 13.1 10*3/uL — ABNORMAL HIGH (ref 3.8–10.8)

## 2019-11-06 NOTE — Patient Instructions (Addendum)
Levonorgestrel intrauterine device (IUD) What is this medicine? LEVONORGESTREL IUD (LEE voe nor jes trel) is a contraceptive (birth control) device. The device is placed inside the uterus by a healthcare professional. It is used to prevent pregnancy. This device can also be used to treat heavy bleeding that occurs during your period. This medicine may be used for other purposes; ask your health care provider or pharmacist if you have questions. COMMON BRAND NAME(S): Minette Headland What should I tell my health care provider before I take this medicine? They need to know if you have any of these conditions:  abnormal Pap smear  cancer of the breast, uterus, or cervix  diabetes  endometritis  genital or pelvic infection now or in the past  have more than one sexual partner or your partner has more than one partner  heart disease  history of an ectopic or tubal pregnancy  immune system problems  IUD in place  liver disease or tumor  problems with blood clots or take blood-thinners  seizures  use intravenous drugs  uterus of unusual shape  vaginal bleeding that has not been explained  an unusual or allergic reaction to levonorgestrel, other hormones, silicone, or polyethylene, medicines, foods, dyes, or preservatives  pregnant or trying to get pregnant  breast-feeding How should I use this medicine? This device is placed inside the uterus by a health care professional. Talk to your pediatrician regarding the use of this medicine in children. Special care may be needed. Overdosage: If you think you have taken too much of this medicine contact a poison control center or emergency room at once. NOTE: This medicine is only for you. Do not share this medicine with others. What if I miss a dose? This does not apply. Depending on the brand of device you have inserted, the device will need to be replaced every 3 to 6 years if you wish to continue using this type  of birth control. What may interact with this medicine? Do not take this medicine with any of the following medications:  amprenavir  bosentan  fosamprenavir This medicine may also interact with the following medications:  aprepitant  armodafinil  barbiturate medicines for inducing sleep or treating seizures  bexarotene  boceprevir  griseofulvin  medicines to treat seizures like carbamazepine, ethotoin, felbamate, oxcarbazepine, phenytoin, topiramate  modafinil  pioglitazone  rifabutin  rifampin  rifapentine  some medicines to treat HIV infection like atazanavir, efavirenz, indinavir, lopinavir, nelfinavir, tipranavir, ritonavir  St. John's wort  warfarin This list may not describe all possible interactions. Give your health care provider a list of all the medicines, herbs, non-prescription drugs, or dietary supplements you use. Also tell them if you smoke, drink alcohol, or use illegal drugs. Some items may interact with your medicine. What should I watch for while using this medicine? Visit your doctor or health care professional for regular check ups. See your doctor if you or your partner has sexual contact with others, becomes HIV positive, or gets a sexual transmitted disease. This product does not protect you against HIV infection (AIDS) or other sexually transmitted diseases. You can check the placement of the IUD yourself by reaching up to the top of your vagina with clean fingers to feel the threads. Do not pull on the threads. It is a good habit to check placement after each menstrual period. Call your doctor right away if you feel more of the IUD than just the threads or if you cannot feel the threads at  all. The IUD may come out by itself. You may become pregnant if the device comes out. If you notice that the IUD has come out use a backup birth control method like condoms and call your health care provider. Using tampons will not change the position of the  IUD and are okay to use during your period. This IUD can be safely scanned with magnetic resonance imaging (MRI) only under specific conditions. Before you have an MRI, tell your healthcare provider that you have an IUD in place, and which type of IUD you have in place. What side effects may I notice from receiving this medicine? Side effects that you should report to your doctor or health care professional as soon as possible:  allergic reactions like skin rash, itching or hives, swelling of the face, lips, or tongue  fever, flu-like symptoms  genital sores  high blood pressure  no menstrual period for 6 weeks during use  pain, swelling, warmth in the leg  pelvic pain or tenderness  severe or sudden headache  signs of pregnancy  stomach cramping  sudden shortness of breath  trouble with balance, talking, or walking  unusual vaginal bleeding, discharge  yellowing of the eyes or skin Side effects that usually do not require medical attention (report to your doctor or health care professional if they continue or are bothersome):  acne  breast pain  change in sex drive or performance  changes in weight  cramping, dizziness, or faintness while the device is being inserted  headache  irregular menstrual bleeding within first 3 to 6 months of use  nausea This list may not describe all possible side effects. Call your doctor for medical advice about side effects. You may report side effects to FDA at 1-800-FDA-1088. Where should I keep my medicine? This does not apply. NOTE: This sheet is a summary. It may not cover all possible information. If you have questions about this medicine, talk to your doctor, pharmacist, or health care provider.  2020 Elsevier/Gold Standard (2018-05-07 13:22:01)  Endometrial Ablation Endometrial ablation is a procedure that destroys the thin inner layer of the lining of the uterus (endometrium). This procedure may be done:  To stop  heavy periods.  To stop bleeding that is causing anemia.  To control irregular bleeding.  To treat bleeding caused by small tumors (fibroids) in the endometrium. This procedure is often an alternative to major surgery, such as removal of the uterus and cervix (hysterectomy). As a result of this procedure:  You may not be able to have children. However, if you are premenopausal (you have not gone through menopause): ? You may still have a small chance of getting pregnant. ? You will need to use a reliable method of birth control after the procedure to prevent pregnancy.  You may stop having a menstrual period, or you may have only a small amount of bleeding during your period. Menstruation may return several years after the procedure. Tell a health care provider about:  Any allergies you have.  All medicines you are taking, including vitamins, herbs, eye drops, creams, and over-the-counter medicines.  Any problems you or family members have had with the use of anesthetic medicines.  Any blood disorders you have.  Any surgeries you have had.  Any medical conditions you have. What are the risks? Generally, this is a safe procedure. However, problems may occur, including:  A hole (perforation) in the uterus or bowel.  Infection of the uterus, bladder, or vagina.  Bleeding.  Damage to other structures or organs.  An air bubble in the lung (air embolus).  Problems with pregnancy after the procedure.  Failure of the procedure.  Decreased ability to diagnose cancer in the endometrium. What happens before the procedure?  You will have tests of your endometrium to make sure there are no pre-cancerous cells or cancer cells present.  You may have an ultrasound of the uterus.  You may be given medicines to thin the endometrium.  Ask your health care provider about: ? Changing or stopping your regular medicines. This is especially important if you take diabetes medicines or  blood thinners. ? Taking medicines such as aspirin and ibuprofen. These medicines can thin your blood. Do not take these medicines before your procedure if your doctor tells you not to.  Plan to have someone take you home from the hospital or clinic. What happens during the procedure?   You will lie on an exam table with your feet and legs supported as in a pelvic exam.  To lower your risk of infection: ? Your health care team will wash or sanitize their hands and put on germ-free (sterile) gloves. ? Your genital area will be washed with soap.  An IV tube will be inserted into one of your veins.  You will be given a medicine to help you relax (sedative).  A surgical instrument with a light and camera (resectoscope) will be inserted into your vagina and moved into your uterus. This allows your surgeon to see inside your uterus.  Endometrial tissue will be removed using one of the following methods: ? Radiofrequency. This method uses a radiofrequency-alternating electric current to remove the endometrium. ? Cryotherapy. This method uses extreme cold to freeze the endometrium. ? Heated-free liquid. This method uses a heated saltwater (saline) solution to remove the endometrium. ? Microwave. This method uses high-energy microwaves to heat up the endometrium and remove it. ? Thermal balloon. This method involves inserting a catheter with a balloon tip into the uterus. The balloon tip is filled with heated fluid to remove the endometrium. The procedure may vary among health care providers and hospitals. What happens after the procedure?  Your blood pressure, heart rate, breathing rate, and blood oxygen level will be monitored until the medicines you were given have worn off.  As tissue healing occurs, you may notice vaginal bleeding for 4-6 weeks after the procedure. You may also experience: ? Cramps. ? Thin, watery vaginal discharge that is light pink or brown in color. ? A need to  urinate more frequently than usual. ? Nausea.  Do not drive for 24 hours if you were given a sedative.  Do not have sex or insert anything into your vagina until your health care provider approves. Summary  Endometrial ablation is done to treat the many causes of heavy menstrual bleeding.  The procedure may be done only after medications have been tried to control the bleeding.  Plan to have someone take you home from the hospital or clinic. This information is not intended to replace advice given to you by your health care provider. Make sure you discuss any questions you have with your health care provider. Document Revised: 12/11/2017 Document Reviewed: 07/13/2016 Elsevier Patient Education  [place].   Take Megace twice daily until bleeding stops then continue once daily until we move forward with the next step to manage the heavy periods

## 2019-11-06 NOTE — Progress Notes (Signed)
SUBJECTIVE   REASON FOR APPOINTMENT Chief Complaint  Patient presents with  . Post op check     HISTORY OF PRESENT ILLNESS Theresa Sawyer presents for routine 2 week post-operative follow up after a hysteroscopy, dilation and curettage  performed on 10/24/2019 for abnormal uterine bleeding and menorrhagia with endometrial polyps.  She was on Megace before the procedure and we told her to discontinue it before the procedure so she has not been on it.  She has continued to have fairly regular constant bleeding since the procedure, although less than she was having prior to the procedure when off Megace.  Overall feels fatigued and is having an achy pain across her lower abdomen.  Denies vaginal discharge. She is tolerating normal diet.  Bowel and bladder function are normal.  She is taking Motrin 800 mg for joint problems and this only helps the achiness in the abdomen a little bit.   OBJECTIVE  BP 134/84   LMP  (LMP Unknown)    PHYSICAL EXAM General:  Patient in no acute distress. Pelvic: Deferred  ASSESSMENT / PLAN  Routine 2 week post operative check.   The patient is doing well and meeting all postoperative milestones.  I reviewed the allergy report which indicated benign endometrial polyps.  I showed her some pictures taken during the hysteroscopy.  With the ongoing abdominal achiness and vaginal bleeding, I think she should go back on Megace for a while which she indicates she still has a supply of.  She should start taking it twice daily and taper use to once daily once the bleeding is under control.  I discussed long-term management options of heavy menstrual bleeding to include oral contraceptive pills, patch, Nexplanon, Mirena IUD, Lysteda.  We also could consider endometrial ablation.  Given her other medical conditions and treatments, it would be best for her to try Mirena IUD which she seems open to.  She will be provided with printed information on endometrial ablation and Mirena IUD  at the checkout window.  I would like her to stop and check a CBC given the fatigue.  We discussed the possibility of endometriosis/adenomyosis given the heavy periods and pelvic achiness, but this should get better when she resumes the Megace and then another form of hormonal contraception.  All questions were answered by the end of the visit.  Joseph Pierini, M.D.

## 2019-11-07 ENCOUNTER — Ambulatory Visit: Payer: 59 | Admitting: Obstetrics and Gynecology

## 2019-11-13 ENCOUNTER — Encounter: Payer: Self-pay | Admitting: Emergency Medicine

## 2019-11-13 ENCOUNTER — Ambulatory Visit: Payer: 59 | Admitting: Emergency Medicine

## 2019-11-13 ENCOUNTER — Other Ambulatory Visit: Payer: Self-pay

## 2019-11-13 VITALS — BP 135/90 | HR 79 | Temp 98.6°F | Resp 16 | Ht 65.0 in | Wt 243.0 lb

## 2019-11-13 DIAGNOSIS — G4733 Obstructive sleep apnea (adult) (pediatric): Secondary | ICD-10-CM | POA: Diagnosis not present

## 2019-11-13 DIAGNOSIS — L409 Psoriasis, unspecified: Secondary | ICD-10-CM | POA: Diagnosis not present

## 2019-11-13 DIAGNOSIS — M79671 Pain in right foot: Secondary | ICD-10-CM

## 2019-11-13 DIAGNOSIS — Z131 Encounter for screening for diabetes mellitus: Secondary | ICD-10-CM | POA: Diagnosis not present

## 2019-11-13 DIAGNOSIS — L4050 Arthropathic psoriasis, unspecified: Secondary | ICD-10-CM

## 2019-11-13 DIAGNOSIS — F418 Other specified anxiety disorders: Secondary | ICD-10-CM

## 2019-11-13 DIAGNOSIS — F411 Generalized anxiety disorder: Secondary | ICD-10-CM

## 2019-11-13 DIAGNOSIS — Z9989 Dependence on other enabling machines and devices: Secondary | ICD-10-CM

## 2019-11-13 LAB — COMPREHENSIVE METABOLIC PANEL WITH GFR
ALT: 24 IU/L (ref 0–32)
AST: 17 IU/L (ref 0–40)
Albumin/Globulin Ratio: 2.4 — ABNORMAL HIGH (ref 1.2–2.2)
Albumin: 4.5 g/dL (ref 3.8–4.8)
Alkaline Phosphatase: 103 IU/L (ref 39–117)
BUN/Creatinine Ratio: 12 (ref 9–23)
BUN: 9 mg/dL (ref 6–24)
Bilirubin Total: 0.4 mg/dL (ref 0.0–1.2)
CO2: 18 mmol/L — ABNORMAL LOW (ref 20–29)
Calcium: 9.4 mg/dL (ref 8.7–10.2)
Chloride: 105 mmol/L (ref 96–106)
Creatinine, Ser: 0.73 mg/dL (ref 0.57–1.00)
GFR calc Af Amer: 119 mL/min/1.73 (ref >59)
GFR calc non Af Amer: 103 mL/min/1.73 (ref >59)
Globulin, Total: 1.9 g/dL (ref 1.5–4.5)
Glucose: 77 mg/dL (ref 65–99)
Potassium: 4.3 mmol/L (ref 3.5–5.2)
Sodium: 139 mmol/L (ref 134–144)
Total Protein: 6.4 g/dL (ref 6.0–8.5)

## 2019-11-13 LAB — CBC WITH DIFFERENTIAL/PLATELET
Basophils Absolute: 0.2 x10E3/uL (ref 0.0–0.2)
Basos: 1 %
EOS (ABSOLUTE): 0.5 x10E3/uL — ABNORMAL HIGH (ref 0.0–0.4)
Eos: 4 %
Hematocrit: 40.5 % (ref 34.0–46.6)
Hemoglobin: 13.3 g/dL (ref 11.1–15.9)
Immature Grans (Abs): 0.1 x10E3/uL (ref 0.0–0.1)
Immature Granulocytes: 1 %
Lymphocytes Absolute: 3.3 x10E3/uL — ABNORMAL HIGH (ref 0.7–3.1)
Lymphs: 27 %
MCH: 25.5 pg — ABNORMAL LOW (ref 26.6–33.0)
MCHC: 32.8 g/dL (ref 31.5–35.7)
MCV: 78 fL — ABNORMAL LOW (ref 79–97)
Monocytes Absolute: 1.1 x10E3/uL — ABNORMAL HIGH (ref 0.1–0.9)
Monocytes: 9 %
Neutrophils Absolute: 7.1 x10E3/uL — ABNORMAL HIGH (ref 1.4–7.0)
Neutrophils: 58 %
Platelets: 398 x10E3/uL (ref 150–450)
RBC: 5.21 x10E6/uL (ref 3.77–5.28)
RDW: 16.1 % — ABNORMAL HIGH (ref 11.7–15.4)
WBC: 12.4 x10E3/uL — ABNORMAL HIGH (ref 3.4–10.8)

## 2019-11-13 LAB — POCT GLYCOSYLATED HEMOGLOBIN (HGB A1C): Hemoglobin A1C: 5.9 % — AB (ref 4.0–5.6)

## 2019-11-13 NOTE — Progress Notes (Signed)
Theresa Sawyer 39 y.o.   Chief Complaint  Patient presents with  . Foot Pain    both for one month  . Bloated    comes and goes about 2 months ago, gotten severe in the last month and nausea sometimes with vomiting    HISTORY OF PRESENT ILLNESS: This is a 39 y.o. female with history of psoriasis.  Recently saw dermatologist and is scheduled for phototherapy and Cosentyx. Has appointment to see rheumatologist next month. Had to go to urgent care center for right foot pain and was started on corticosteroids but did not take them.  Found to have a bone spur on her right foot. Also complaining of intermittent bloating and abdominal fullness sensation for 2 months.  Occasional nausea and vomiting. Also concerned about possible diabetes due to strong family history. No other complaints or medical concerns today.  HPI   Prior to Admission medications   Medication Sig Start Date End Date Taking? Authorizing Provider  ALPRAZolam (XANAX) 0.5 MG tablet TAKE 1 TABLET(0.5 MG) BY MOUTH DAILY AS NEEDED FOR ANXIETY 09/11/19  Yes Daoud Lobue, Ines Bloomer, MD  famotidine (PEPCID) 40 MG tablet TAKE 1 TABLET(40 MG) BY MOUTH TWICE DAILY 07/17/19  Yes Thornton Park, MD  Ferrous Sulfate (IRON PO) Take 2 tablets by mouth every evening.    Yes [provider]  ibuprofen (ADVIL) 800 MG tablet Take 1 tablet (800 mg total) by mouth 3 (three) times daily. 11/02/19  Yes Avegno, Darrelyn Hillock, FNP  megestrol (MEGACE) 40 MG tablet Take one tab po twice daily until bleeding stops then one tab po daily until surgery. 09/12/19  Yes Huel Cote, NP  Multiple Vitamins-Minerals (MULTIVITAMIN WITH MINERALS) tablet Take 1 tablet by mouth daily.   Yes [provider]  omeprazole (PRILOSEC) 40 MG capsule TAKE 1 CAPSULE(40 MG) BY MOUTH DAILY Patient taking differently: Take 40 mg by mouth daily as needed.  07/17/19  Yes Thornton Park, MD  predniSONE (STERAPRED UNI-PAK 21 TAB) 10 MG (21) TBPK tablet Take 6 tabs by  mouth daily  for 2 days, then 5 tabs for 2 days, then 4 tabs for 2 days, then 3 tabs for 2 days, 2 tabs for 2 days, then 1 tab by mouth daily for 2 days 11/02/19  Yes Avegno, Darrelyn Hillock, FNP  acetaminophen (TYLENOL) 500 MG tablet Take 2 tablets (1,000 mg total) by mouth every 6 (six) hours. Take for 5 days, then as needed Patient not taking: Reported on 11/13/2019 10/24/19 10/23/20  Joseph Pierini, MD  aspirin EC 81 MG tablet Take 81 mg by mouth daily as needed for mild pain.     [provider]  Aspirin-Acetaminophen-Caffeine (GOODYS EXTRA STRENGTH PO) Take 1 packet by mouth as needed.     [provider]  cetirizine (ZYRTEC) 10 MG tablet Take 1 tablet (10 mg total) by mouth daily. Patient not taking: Reported on 11/13/2019 07/10/19   Wurst, [place], PA-C  escitalopram (LEXAPRO) 20 MG tablet Take 1 tablet (20 mg total) by mouth daily. Patient taking differently: Take 20 mg by mouth every evening.  03/13/19 10/20/19  Horald Pollen, MD  oxyCODONE (ROXICODONE) 5 MG immediate release tablet Take 1 tablet (5 mg total) by mouth every 6 (six) hours as needed for severe pain. Patient not taking: Reported on 11/13/2019 10/24/19   Joseph Pierini, MD  triamcinolone cream (KENALOG) 0.1 % Apply 1 application topically 2 (two) times daily.    [provider]    No Known Allergies  Patient Active Problem List   Diagnosis Date Noted  . OSA on CPAP 04/24/2019  . Psoriasis 12/05/2018  . Situational anxiety 12/05/2018  . Intermittent hypertension 12/05/2018    Past Medical History:  Diagnosis Date  . Abnormal uterine bleeding (AUB)   . Anxiety   . GERD (gastroesophageal reflux disease)   . History of chest pain (10-20-2019  pt denies any cardiac s&s)   mutliple ED visits, atypical chest pain, chest wall pain, muscularskeletal chest pain;  pt had cardiology evaulation dated 04-23-2018 note in epic , state atypical chest wall pain with negative d dimer and troponin's multiple  times in epic, suggested CTA  (pt did not get done)  . Hyperlipidemia   . IDA (iron deficiency anemia)   . OSA on CPAP    followed by dr Dia Sitter  . Psoriasis   . Psoriatic arthritis [facility])     Past Surgical History:  Procedure Laterality Date  . DILATATION & CURETTAGE/HYSTEROSCOPY WITH MYOSURE N/A 10/24/2019   Procedure: DILATATION & CURETTAGE/HYSTEROSCOPY WITH MYOSURE;  Surgeon: Joseph Pierini, MD;  Location: [facility];  Service: Gynecology;  Laterality: N/A;  . TUBAL LIGATION Bilateral 02/05/2013   Procedure: POST PARTUM TUBAL LIGATION;  Surgeon: Woodroe Mode, MD;  Location: [facility];  Service: Gynecology;  Laterality: Bilateral;    Filshie clips    Social History   Socioeconomic History  . Marital status: Legally Separated    Spouse name: Not on file  . Number of children: Not on file  . Years of education: Not on file  . Highest education level: Not on file  Occupational History  . Not on file  Tobacco Use  . Smoking status: Current Some Day Smoker    Packs/day: 0.50    Years: 20.00    Pack years: 10.00    Types: Cigarettes  . Smokeless tobacco: Never Used  Substance and Sexual Activity  . Alcohol use: No  . Drug use: Never  . Sexual activity: Yes    Birth control/protection: Surgical  Other Topics Concern  . Not on file  Social History Narrative  . Not on file   Social Determinants of Health   Financial Resource Strain:   . Difficulty of Paying Living Expenses:   Food Insecurity:   . Worried About Charity fundraiser in the Last Year:   . Arboriculturist in the Last Year:   Transportation Needs:   . Film/video editor (Medical):   Marland Kitchen Lack of Transportation (Non-Medical):   Physical Activity:   . Days of Exercise per Week:   . Minutes of Exercise per Session:   Stress:   . Feeling of Stress :   Social Connections:   . Frequency of Communication with Friends and Family:   . Frequency of Social Gatherings with Friends and Family:   .  Attends Religious Services:   . Active Member of Clubs or Organizations:   . Attends Archivist Meetings:   Marland Kitchen Marital Status:   Intimate Partner Violence:   . Fear of Current or Ex-Partner:   . Emotionally Abused:   Marland Kitchen Physically Abused:   . Sexually Abused:     Family History  Problem Relation Age of Onset  . Diabetes Maternal Grandmother   . Diabetes Paternal Grandmother   . Anxiety disorder Mother   . Heart attack Brother 34       deceased from this  . Anxiety disorder Brother   . Cirrhosis Maternal Grandfather  alcohol related  . Colon cancer Neg Hx   . Esophageal cancer Neg Hx   . Colon polyps Neg Hx   . Stomach cancer Neg Hx   . Pancreatic cancer Neg Hx      Review of Systems  Constitutional: Negative.  Negative for chills and fever.  HENT: Negative.  Negative for congestion and sore throat.   Respiratory: Negative.  Negative for cough and shortness of breath.   Cardiovascular: Negative.  Negative for chest pain and palpitations.  Gastrointestinal: Positive for nausea. Negative for abdominal pain, blood in stool, diarrhea, melena and vomiting.       Occasional bloating  Genitourinary: Negative.  Negative for hematuria.  Musculoskeletal: Positive for joint pain.  Skin: Positive for rash.       Chronic psoriatic rash  Neurological: Negative.  Negative for dizziness and headaches.  All other systems reviewed and are negative.  Today's Vitals   11/13/19 1046  BP: 135/90  Pulse: 79  Resp: 16  Temp: 98.6 F (37 C)  TempSrc: Temporal  SpO2: 96%  Weight: 243 lb (110.2 kg)  Height: 5' 5"  (1.651 m)   Body mass index is 40.44 kg/m.   Physical Exam Vitals reviewed.  Constitutional:      Appearance: Normal appearance.  HENT:     Head: Normocephalic.  Eyes:     Extraocular Movements: Extraocular movements intact.     Pupils: Pupils are equal, round, and reactive to light.  Cardiovascular:     Rate and Rhythm: Normal rate and regular rhythm.       Pulses: Normal pulses.  Pulmonary:     Effort: Pulmonary effort is normal.     Breath sounds: Normal breath sounds.  Musculoskeletal:        General: Normal range of motion.     Cervical back: Normal range of motion.     Comments: Feet: Warm to touch.  No erythema or ecchymosis.  Good peripheral pulses.  Good capillary refill.  Good sensation.  Skin:    General: Skin is warm and dry.     Capillary Refill: Capillary refill takes less than 2 seconds.     Findings: Rash (Psoriasis) present.  Neurological:     General: No focal deficit present.     Mental Status: She is alert and oriented to person, place, and time.    A total of 30 minutes was spent with the patient, greater than 50% of which was in counseling/coordination of care regarding review of chronic medical problems and management, review of all medications, differential diagnosis of right foot pain and how to treat it, ways to deal with abdominal bloating and fullness including fiber supplementation and probiotics as well as diet and nutrition, need to follow-up with both dermatology and rheumatology, review of most recent blood work results, review of most recent office visit notes, prognosis and need for follow-up.   ASSESSMENT & PLAN: Libia was seen today for foot pain and bloated.  Diagnoses and all orders for this visit:  Psoriasis -     CBC with Differential/Platelet -     Comprehensive metabolic panel  Psoriatic arthritis (HCC)  Right foot pain  OSA on CPAP  Situational anxiety  Generalized anxiety disorder  Screening for diabetes mellitus -     POCT glycosylated hemoglobin (Hb A1C)    Patient Instructions       If you have lab work done today you will be contacted with your lab results within the next 2 weeks.  If  you have not heard from [place] then please contact us. The fastest way to get your results is to register for My Chart.   IF you received an x-ray today, you will receive an invoice from  [facility]. Please contact [facility] at [phone] with questions or concerns regarding your invoice.   IF you received labwork today, you will receive an invoice from [facility]. Please contact [facility] at [phone] with questions or concerns regarding your invoice.   Our billing staff will not be able to assist you with questions regarding bills from these companies.  You will be contacted with the lab results as soon as they are available. The fastest way to get your results is to activate your My Chart account. Instructions are located on the last page of this paperwork. If you have not heard from [place] regarding the results in 2 weeks, please contact this office.     Psoriasis Psoriasis is a long-term (chronic) skin condition. It occurs because your body's defense system (immune system) causes skin cells to form too quickly. This causes raised, red patches (plaques) on your skin that look silvery. The patches may be on all areas of your body. They can be any size or shape. Psoriasis can come and go. It can range from mild to very bad. It cannot be passed from one person to another (is not contagious). There is no cure for this condition, but it can be helped with treatment. What are the causes? The cause of psoriasis is not known. Some things can make it worse. These are:  Skin damage, such as cuts, scrapes, sunburn, and dryness.  Not getting enough sunlight.  Some medicines.  Alcohol.  Tobacco.  Stress.  Infections. What increases the risk?  Having a family member with psoriasis.  Being very overweight (obese).  Being 19-29 years old.  Taking certain medicines. What are the signs or symptoms? There are different types of psoriasis. The types are:  Plaque. This is the most common. Symptoms include red, raised patches with a silvery coating. These may be itchy. Your nails may be crumbly or fall off.  Guttate. Symptoms include small red spots on your  stomach area, arms, and legs. These may happen after you have been sick, such as with strep throat.  Inverse. Symptoms include patches in your armpits, under your breasts, private areas, or on your butt.  Pustular. Symptoms include pus-filled bumps on the palms of your hands or the soles of your feet. You also may feel very tired, weak, have a fever, and not be hungry.  Erythrodermic. Symptoms include bright red skin that looks burned. You may have a fast heartbeat and a body temperature that is too high or too low. You may be itchy or in pain.  Sebopsoriasis. Symptoms include red patches on your scalp, forehead, and face that are greasy.  Psoriatic arthritis. Symptoms include swollen, painful joints along with scaly skin patches. How is this treated? There is no cure for this condition, but treatment can:  Help your skin heal.  Lessen itching and irritation and swelling (inflammation).  Slow the growth of new skin cells.  Help your body's defense system respond better to your skin. Treatment may include:  Creams or ointments.  Light therapy. This may include natural sunlight or light therapy in a doctor's office.  Medicines. These can help your body better manage skin cells. They may be used with light therapy or ointments. Medicines may include pills or injections. You may also get  antibiotic medicines if you have an infection. Follow these instructions at home: Skin Care  Apply lotion to your skin as needed. Only use those that your doctor has said are okay.  Apply cool, wet cloths (cold compresses) to the affected areas.  Do not use a hot tub or take hot showers. Use slightly warm, not hot, water when taking showers and baths.  Do not scratch your skin. Lifestyle   Do not use any products that contain nicotine or tobacco, such as cigarettes, e-cigarettes, and chewing tobacco. If you need help quitting, ask your doctor.  Lower your stress.  Keep a healthy weight.  Go  out in the sun as told by your doctor. Do not get sunburned.  Join a support group. Medicines  Take or use over-the-counter and prescription medicines only as told by your doctor.  If you were prescribed an antibiotic medicine, take it as told by your doctor. Do not stop using the antibiotic even if you start to feel better. Alcohol use If you drink alcohol:  Limit how much you use: ? 0-1 drink a day for women. ? 0-2 drinks a day for men.  Be aware of how much alcohol is in your drink. In the [place] one drink equals one 12 oz bottle of beer (355 mL), one 5 oz glass of wine (148 mL), or one 1 oz glass of hard liquor (44 mL). General instructions  Keep a journal to track the things that cause symptoms (triggers). Try to avoid these things.  See a counselor if you feel the support would help.  Keep all follow-up visits as told by your doctor. This is important. Contact a doctor if:  You have a fever.  Your pain gets worse.  You have more redness or warmth in the affected areas.  You have new or worse pain or stiffness in your joints.  Your nails start to break easily or pull away from the nail bed.  You feel very sad (depressed). Summary  Psoriasis is a long-term (chronic) skin condition.  There is no cure for this condition, but treatment can help manage it.  Keep a journal to track the things that cause symptoms.  Take or use over-the-counter and prescription medicines only as told by your doctor.  Keep all follow-up visits as told by your doctor. This is important. This information is not intended to replace advice given to you by your health care provider. Make sure you discuss any questions you have with your health care provider. Document Revised: 04/30/2018 Document Reviewed: 04/30/2018 Elsevier Patient Education  2020 Elsevier Inc.      Agustina Caroli, MD [facility]

## 2019-11-13 NOTE — Patient Instructions (Addendum)
If you have lab work done today you will be contacted with your lab results within the next 2 weeks.  If you have not heard from [place] then please contact us. The fastest way to get your results is to register for My Chart.   IF you received an x-ray today, you will receive an invoice from [facility]. Please contact [facility] at [phone] with questions or concerns regarding your invoice.   IF you received labwork today, you will receive an invoice from [facility]. Please contact [facility] at [phone] with questions or concerns regarding your invoice.   Our billing staff will not be able to assist you with questions regarding bills from these companies.  You will be contacted with the lab results as soon as they are available. The fastest way to get your results is to activate your My Chart account. Instructions are located on the last page of this paperwork. If you have not heard from [place] regarding the results in 2 weeks, please contact this office.     Psoriasis Psoriasis is a long-term (chronic) skin condition. It occurs because your body's defense system (immune system) causes skin cells to form too quickly. This causes raised, red patches (plaques) on your skin that look silvery. The patches may be on all areas of your body. They can be any size or shape. Psoriasis can come and go. It can range from mild to very bad. It cannot be passed from one person to another (is not contagious). There is no cure for this condition, but it can be helped with treatment. What are the causes? The cause of psoriasis is not known. Some things can make it worse. These are:  Skin damage, such as cuts, scrapes, sunburn, and dryness.  Not getting enough sunlight.  Some medicines.  Alcohol.  Tobacco.  Stress.  Infections. What increases the risk?  Having a family member with psoriasis.  Being very overweight (obese).  Being 71-81 years old.  Taking certain  medicines. What are the signs or symptoms? There are different types of psoriasis. The types are:  Plaque. This is the most common. Symptoms include red, raised patches with a silvery coating. These may be itchy. Your nails may be crumbly or fall off.  Guttate. Symptoms include small red spots on your stomach area, arms, and legs. These may happen after you have been sick, such as with strep throat.  Inverse. Symptoms include patches in your armpits, under your breasts, private areas, or on your butt.  Pustular. Symptoms include pus-filled bumps on the palms of your hands or the soles of your feet. You also may feel very tired, weak, have a fever, and not be hungry.  Erythrodermic. Symptoms include bright red skin that looks burned. You may have a fast heartbeat and a body temperature that is too high or too low. You may be itchy or in pain.  Sebopsoriasis. Symptoms include red patches on your scalp, forehead, and face that are greasy.  Psoriatic arthritis. Symptoms include swollen, painful joints along with scaly skin patches. How is this treated? There is no cure for this condition, but treatment can:  Help your skin heal.  Lessen itching and irritation and swelling (inflammation).  Slow the growth of new skin cells.  Help your body's defense system respond better to your skin. Treatment may include:  Creams or ointments.  Light therapy. This may include natural sunlight or light therapy in a doctor's office.  Medicines. These can help your  body better manage skin cells. They may be used with light therapy or ointments. Medicines may include pills or injections. You may also get antibiotic medicines if you have an infection. Follow these instructions at home: Skin Care  Apply lotion to your skin as needed. Only use those that your doctor has said are okay.  Apply cool, wet cloths (cold compresses) to the affected areas.  Do not use a hot tub or take hot showers. Use  slightly warm, not hot, water when taking showers and baths.  Do not scratch your skin. Lifestyle   Do not use any products that contain nicotine or tobacco, such as cigarettes, e-cigarettes, and chewing tobacco. If you need help quitting, ask your doctor.  Lower your stress.  Keep a healthy weight.  Go out in the sun as told by your doctor. Do not get sunburned.  Join a support group. Medicines  Take or use over-the-counter and prescription medicines only as told by your doctor.  If you were prescribed an antibiotic medicine, take it as told by your doctor. Do not stop using the antibiotic even if you start to feel better. Alcohol use If you drink alcohol:  Limit how much you use: ? 0-1 drink a day for women. ? 0-2 drinks a day for men.  Be aware of how much alcohol is in your drink. In the [place] one drink equals one 12 oz bottle of beer (355 mL), one 5 oz glass of wine (148 mL), or one 1 oz glass of hard liquor (44 mL). General instructions  Keep a journal to track the things that cause symptoms (triggers). Try to avoid these things.  See a counselor if you feel the support would help.  Keep all follow-up visits as told by your doctor. This is important. Contact a doctor if:  You have a fever.  Your pain gets worse.  You have more redness or warmth in the affected areas.  You have new or worse pain or stiffness in your joints.  Your nails start to break easily or pull away from the nail bed.  You feel very sad (depressed). Summary  Psoriasis is a long-term (chronic) skin condition.  There is no cure for this condition, but treatment can help manage it.  Keep a journal to track the things that cause symptoms.  Take or use over-the-counter and prescription medicines only as told by your doctor.  Keep all follow-up visits as told by your doctor. This is important. This information is not intended to replace advice given to you by your health care provider.  Make sure you discuss any questions you have with your health care provider. Document Revised: 04/30/2018 Document Reviewed: 04/30/2018 Elsevier Patient Education  2020 [employer].

## 2019-11-19 ENCOUNTER — Other Ambulatory Visit: Payer: Self-pay

## 2019-11-20 ENCOUNTER — Encounter: Payer: Self-pay | Admitting: Obstetrics and Gynecology

## 2019-11-20 ENCOUNTER — Ambulatory Visit: Payer: 59 | Admitting: Obstetrics and Gynecology

## 2019-11-20 VITALS — BP 124/82

## 2019-11-20 DIAGNOSIS — N898 Other specified noninflammatory disorders of vagina: Secondary | ICD-10-CM

## 2019-11-20 DIAGNOSIS — N939 Abnormal uterine and vaginal bleeding, unspecified: Secondary | ICD-10-CM

## 2019-11-20 DIAGNOSIS — R350 Frequency of micturition: Secondary | ICD-10-CM | POA: Diagnosis not present

## 2019-11-20 DIAGNOSIS — R102 Pelvic and perineal pain unspecified side: Secondary | ICD-10-CM

## 2019-11-20 DIAGNOSIS — N941 Unspecified dyspareunia: Secondary | ICD-10-CM | POA: Diagnosis not present

## 2019-11-20 LAB — WET PREP FOR TRICH, YEAST, CLUE

## 2019-11-20 MED ORDER — MEDROXYPROGESTERONE ACETATE 10 MG PO TABS: 10.0000 mg | ORAL_TABLET | Freq: Every day | ORAL | 3 refills | Status: DC

## 2019-11-20 NOTE — Patient Instructions (Signed)
Diagnostic Laparoscopy Diagnostic laparoscopy is a procedure to diagnose diseases in the abdomen. It might be done for a variety of reasons, such as to look for scar tissue, cancer, or a reason for abdomen (abdominal) pain. During the procedure, a thin, flexible tube that has a light and a camera on the end (laparoscope) is inserted through an incision in the abdomen. The image from the camera is shown on a monitor to help your surgeon see inside your body. Tell a health care provider about:  Any allergies you have.  All medicines you are taking, including vitamins, herbs, eye drops, creams, and over-the-counter medicines.  Any problems you or family members have had with anesthetic medicines.  Any blood disorders you have.  Any surgeries you have had.  Any medical conditions you have. What are the risks? Generally, this is a safe procedure. However, problems may occur, including:  Infection.  Bleeding.  Allergic reactions to medicines or dyes.  Damage to abdominal structures or organs, such as the intestines, liver, stomach, or spleen. What happens before the procedure? Medicines  Ask your health care provider about: ? Changing or stopping your regular medicines. This is especially important if you are taking diabetes medicines or blood thinners. ? Taking medicines such as aspirin and ibuprofen. These medicines can thin your blood. Do not take these medicines unless your health care provider tells you to take them. ? Taking over-the-counter medicines, vitamins, herbs, and supplements.  You may be given antibiotic medicine to help prevent infection. Staying hydrated Follow instructions from your health care provider about hydration, which may include:  Up to 2 hours before the procedure - you may continue to drink clear liquids, such as water, clear fruit juice, black coffee, and plain tea. Eating and drinking restrictions Follow instructions from your health care provider  about eating and drinking, which may include:  8 hours before the procedure - stop eating heavy meals or foods such as meat, fried foods, or fatty foods.  6 hours before the procedure - stop eating light meals or foods, such as toast or cereal.  6 hours before the procedure - stop drinking milk or drinks that contain milk.  2 hours before the procedure - stop drinking clear liquids. General instructions  Ask your health care provider how your surgical site will be marked or identified.  You may be asked to shower with a germ-killing soap.  Plan to have someone take you home from the hospital or clinic.  Plan to have a responsible adult care for you for at least 24 hours after you leave the hospital or clinic. This is important. What happens during the procedure?   To lower your risk of infection: ? Your health care team will wash or sanitize their hands. ? Hair may be removed from the surgical area. ? Your skin will be washed with soap.  An IV will be inserted into one of your veins.  You will be given a medicine to make you fall asleep (general anesthetic). You may also be given a medicine to help you relax (sedative).  A breathing tube will be placed down your throat to help you breathe during the procedure.  Your abdomen will be filled with an air-like gas so it expands. This will give the surgeon more room to operate and will make your organs easier to see.  Many small incisions will be made in your abdomen.  A laparoscope and other surgical instruments will be inserted into your abdomen through the  incisions.  A tissue sample may be removed from an organ for examination (biopsy). This will depend on the reason why you are having this procedure.  The laparoscope and other instruments will be removed from your abdomen.  The gas will be released.  Your incisions will be closed with stitches (sutures) and covered with a bandage (dressing).  Your breathing tube will be  removed. The procedure may vary among health care providers and hospitals. What happens after the procedure?   Your blood pressure, heart rate, breathing rate, and blood oxygen level will be monitored until the medicines you were given have worn off.  Do not drive for 24 hours if you were given a sedative during your procedure.  It is up to you to get the results of your procedure. Ask your health care provider, or the department that is doing the procedure, when your results will be ready. Summary  Diagnostic laparoscopy is a way to look for problems in the abdomen using small incisions.  Follow instructions from your health care provider about how to prepare for the procedure.  Plan to have a responsible adult care for you for at least 24 hours after you leave the hospital or clinic. This is important. This information is not intended to replace advice given to you by your health care provider. Make sure you discuss any questions you have with your health care provider. Document Revised: 06/08/2017 Document Reviewed: 12/20/2016 Elsevier Patient Education  [place].

## 2019-11-20 NOTE — Progress Notes (Signed)
Theresa Sawyer [birth year] [id number]  SUBJECTIVE:  39 y.o. [id number] female presents for a vaginal itching with possible discharge.  She also has been feeling significant intermittent cramping in her lower abdomen bilaterally, increased right after the recent hysteroscopy D&C procedure 10/24/2019.  Some radiation of the inflammation and cramping into her left back and flank.  Also notes increased urinary frequency.  She has also been off Megace since the procedure which she had been taking leading up to it.  Intercourse also chronically uncomfortable, causing pain in the lower abdominal area during and after.  Her menstrual bleeding has greatly decreased after the D&C.  D&C pathology indicated benign endometrial polyps.  She previously had a BTL.  She is also notes increased fatigue, her CBC was normal after her last visit other than a resolving mild leukocytosis, and she had a normal TSH earlier this year.  A recent hemoglobin A1c was in a prediabetic range at 5.9%.  She is following with her primary doctor Dr. Mitchel Honour.  Current Outpatient Medications  Medication Sig Dispense Refill  . ALPRAZolam (XANAX) 0.5 MG tablet TAKE 1 TABLET(0.5 MG) BY MOUTH DAILY AS NEEDED FOR ANXIETY 30 tablet 0  . famotidine (PEPCID) 40 MG tablet TAKE 1 TABLET(40 MG) BY MOUTH TWICE DAILY 60 tablet 3  . Ferrous Sulfate (IRON PO) Take 2 tablets by mouth every evening.     Marland Kitchen ibuprofen (ADVIL) 800 MG tablet Take 1 tablet (800 mg total) by mouth 3 (three) times daily. 30 tablet 0  . Multiple Vitamins-Minerals (MULTIVITAMIN WITH MINERALS) tablet Take 1 tablet by mouth daily.    Marland Kitchen omeprazole (PRILOSEC) 40 MG capsule TAKE 1 CAPSULE(40 MG) BY MOUTH DAILY (Patient taking differently: Take 40 mg by mouth daily as needed. ) 30 capsule 3  . triamcinolone cream (KENALOG) 0.1 % Apply 1 application topically 2 (two) times daily.    Marland Kitchen acetaminophen (TYLENOL) 500 MG tablet Take 2 tablets (1,000 mg total) by mouth every 6 (six) hours. Take for  5 days, then as needed (Patient not taking: Reported on 11/13/2019)  2  . aspirin EC 81 MG tablet Take 81 mg by mouth daily as needed for mild pain.     . Aspirin-Acetaminophen-Caffeine (GOODYS EXTRA STRENGTH PO) Take 1 packet by mouth as needed.     . cetirizine (ZYRTEC) 10 MG tablet Take 1 tablet (10 mg total) by mouth daily. (Patient not taking: Reported on 11/13/2019) 30 tablet 0  . escitalopram (LEXAPRO) 20 MG tablet Take 1 tablet (20 mg total) by mouth daily. (Patient taking differently: Take 20 mg by mouth every evening. ) 90 tablet 3  . predniSONE (STERAPRED UNI-PAK 21 TAB) 10 MG (21) TBPK tablet Take 6 tabs by mouth daily  for 2 days, then 5 tabs for 2 days, then 4 tabs for 2 days, then 3 tabs for 2 days, 2 tabs for 2 days, then 1 tab by mouth daily for 2 days (Patient not taking: Reported on 11/20/2019) 42 tablet 0   No current facility-administered medications for this visit.   Allergies: Patient has no known allergies.  No LMP recorded (lmp unknown). (Menstrual status: Irregular Periods).  Past medical history,surgical history, problem list, medications, allergies, family history and social history were all reviewed and documented as reviewed in the EPIC chart.  ROS:  Feeling well. No dyspnea or chest pain on exertion.  No abdominal pain, change in bowel habits, black or bloody stools.  No urinary tract symptoms. GYN ROS: as described in HPI.  No neurological complaints.   OBJECTIVE:  BP 124/82   LMP  (LMP Unknown) Comment: 10/24/2019 D&C The patient appears well, alert, oriented x 3, in no distress. PELVIC EXAM: VULVA: normal appearing vulva with no masses, tenderness or lesions, VAGINA: normal appearing vagina with normal color and scant clear/yellow discharge, no lesions, CERVIX: normal appearing cervix without discharge or lesions, UTERUS: uterus is normal size, shape, consistency and nontender, general tenderness across lower abdomen, no rebound/guarding, ADNEXA: normal adnexa in  size, nontender and no masses, WET MOUNT done - results: negative for pathogens, normal epithelial cells  Chaperone: Caryn Bee present during the examination  ASSESSMENT:  39 y.o. [id number] here for further discussion of abnormal uterine bleeding/menorrhagia and chronic pelvic pain dyspareunia, vaginal discharge and increased urinary frequency  PLAN:  1. AUB.  We discussed various options for management of heavy periods, keeping in mind that she is smoking but trying to quit.  We will avoid contraceptives that contain estrogen.  I encouraged her to consider the Mirena IUD but she is not comfortable having it placed.  We could indefinitely keep her on a progestin as she was on Megace before (with suboptimal control of symptoms) but I would recommend a less potent hormone such as Provera for longer term use.  She agrees to start Provera 10 mg daily continuously for now but she would like a long-term solution as well to address the heavy periods and pelvic pain without having to take hormonal pills.  We have previously discussed endometrial ablation and provided her with information after her last visit on the procedure and she is very interested in proceeding with that.  She has had a previous tubal ligation.  Recent D&C pathology was benign.  We discussed the possibility of post ablation pain syndrome, particularly if we have suspicion that she has endometriosis and/or adenomyosis and we discussed the mechanisms for that.  If this does not improve with conservative management, hysterectomy would be the next indicated step.  She acknowledges understanding.  2.  Chronic pelvic pain and dyspareunia.  The description of her pain symptoms, seeming to get worse when off of a progestin, and the distribution of the discomfort, also exacerbated by today's exam, sounds like she could have endometriosis and/or adenomyosis.  It would be reasonable to pursue a diagnostic laparoscopy with possible fulguration of  endometriosis.  She has not had any previous laparoscopies.  We discussed laparoscopy in detail today.  We discussed that we have a suspicion of endometriosis but we may not find any visual evidence during the laparoscopy, or adenomyosis is present but cannot be visually confirmed without removal of the uterus and pathology analysis.  I did not recommend proceeding directly with hysterectomy right now as that is a major procedure with higher risk for complications, particularly as she is smoking and would be at increased risk for healing complications.  With surgery, there are risks of infection, bleeding, possibility of needing a blood transfusion, injury to bowel, bladder, major pelvic vessels, ureter, nerves from positioning and or skin irritation, and deep venous thrombosis. The risk of inadvertent injury to internal organs either immediately recognized or delay recognized necessitating major exploratory reparative surgeries and future reparative surgeries including bowel resection, ostomy formation, bladder repair, ureteral damage repair was discussed with her.  General anesthesia also has risks including myocardial infarction, stroke, and death.  Common scenarios for complications from surgery were reviewed with the patient including focus on bladder and/or ureteral injuries.  Incisional complications to include opening and  draining of incisions and closure by secondary intention, dehiscence, and hernia formation were reviewed. Generally the complication rate is less than 1%.  Conversion to laparotomy may be deemed necessary at the time of the procedure, which if performed would result in longer hospital stay, and more postoperative pain and healing time.  She is in agreement and would like to proceed with a D&C, NovaSure endometrial ablation, diagnostic laparoscopy with possible fulguration of endometriosis.  Printed information on laparoscopy is provided to the patient at checkout.  We will have staff  contact her to arrange a preoperative visit (in person or virtual is fine) and the procedure date.    3.  Vaginal discharge and dysuria.  Urinalysis is unremarkable and low suspicion for infection.  We will send the urine for culture for confirmation.  Vaginal wet prep is negative today.  No infectious etiology for her symptoms today but suspect they are related to the issues as described above.  All questions were answered by the end of the visit.   Joseph Pierini MD 11/20/19

## 2019-11-22 LAB — URINALYSIS, COMPLETE W/RFL CULTURE
Bilirubin Urine: NEGATIVE
Glucose, UA: NEGATIVE
Hyaline Cast: NONE SEEN /LPF
Ketones, ur: NEGATIVE
Leukocyte Esterase: NEGATIVE
Nitrites, Initial: NEGATIVE
Protein, ur: NEGATIVE
RBC / HPF: NONE SEEN /HPF (ref 0–2)
Specific Gravity, Urine: 1.015 (ref 1.001–1.03)
WBC, UA: NONE SEEN /HPF (ref 0–5)
pH: 5.5 (ref 5.0–8.0)

## 2019-11-22 LAB — CULTURE INDICATED

## 2019-11-22 LAB — URINE CULTURE
MICRO NUMBER:: 10474117
Result:: NO GROWTH
SPECIMEN QUALITY:: ADEQUATE

## 2019-11-24 ENCOUNTER — Telehealth: Payer: Self-pay

## 2019-11-24 NOTE — Telephone Encounter (Signed)
Called patient and left detailed message calling to schedule surgery. Asked her would 01/13/20 work for her at 8:45am.  Her Covid test would be 01/09/20 and she would have to quarantine that weekend of the 4th. Asked her to call me back and let me know.

## 2019-11-24 NOTE — Telephone Encounter (Signed)
Spoke with patient and scheduled surgery for 01/13/20 at 9:45am at [facility]-  Reviewed $50.00 copayment due by one week prior to surgery to [place].   Someone from appt desk will contact her to schedule pre op appt.  Scheduled Covid test and reviewed the quarantine protocol with her.  I will mail her a packet.

## 2019-11-28 NOTE — Progress Notes (Signed)
Office Visit Note  Patient: Theresa Sawyer             Date of Birth: Sep 03, 1980           MRN: [id number]             PCP: Horald Pollen, MD Referring: Horald Pollen, * Visit Date: 12/12/2019 Occupation: @GUAROCC @  Subjective:  Psoriasis, pain in multiple joints   History of Present Illness: Theresa Sawyer is a 39 y.o. female seen in consultation per request of Dr. Mitchel Honour.  According the patient her symptoms started in 2013 with psoriasis patches.  She states in 2014 when she was postpartum and her psoriasis got worse.  She had tried methotrexate in the beginning but discontinued it due to fatigue.  She initially tried only topical agents and then about 3 years ago Dr. Nevada Crane started her on [place].  She states [place] Nail was helpful but it was discontinued after 1 year as the cost was too much.  He tried it again about 1-1/2-year ago but she did not have a good response to [place] and had headaches.  She tried it for 3 to 4 months and then discontinued.  She has been using only topical agents.  She was recently seen by Dr. April Manson who prescribed some topical ointments and advised phototherapy 3 times a week but it was not covered by her insurance.  She also applied for Cosentyx which was also denied by her insurance.  She states for the last 2 years she has been having increased pain in her joints which involves her bilateral hands, bilateral knees, both feet and ankles.  She is also had problems with Achilles tendinitis and plantar fasciitis.  She has been on prednisone taper for couple of times.  She also describes SI joint and lower back pain for the last 6 months.  She denies any history of eye involvement.  There is family history of psoriasis in her sister and first cousin.  Activities of Daily Living:  Patient reports morning stiffness for 24 hours.   Patient Reports nocturnal pain.  Difficulty dressing/grooming: Denies Difficulty climbing stairs: Reports Difficulty getting out  of chair: Reports Difficulty using hands for taps, buttons, cutlery, and/or writing: Reports  Review of Systems  Constitutional: Positive for fatigue. Negative for night sweats, weight gain and weight loss.  HENT: Negative for mouth sores, trouble swallowing, trouble swallowing, mouth dryness and nose dryness.   Eyes: Negative for pain, redness, itching, visual disturbance and dryness.  Respiratory: Negative for cough, shortness of breath and difficulty breathing.   Cardiovascular: Negative for chest pain, palpitations, hypertension, irregular heartbeat and swelling in legs/feet.  Gastrointestinal: Negative for blood in stool, constipation and diarrhea.  Endocrine: Positive for increased urination.  Genitourinary: Negative for difficulty urinating and vaginal dryness.  Musculoskeletal: Positive for arthralgias, joint pain, joint swelling, myalgias, morning stiffness, muscle tenderness and myalgias. Negative for muscle weakness.  Skin: Positive for color change, rash and hair loss. Negative for redness, skin tightness, ulcers and sensitivity to sunlight.       Psoriasis  Allergic/Immunologic: Negative for susceptible to infections.  Neurological: Negative for dizziness, numbness, headaches, memory loss, night sweats and weakness.  Hematological: Negative for bruising/bleeding tendency and swollen glands.  Psychiatric/Behavioral: Positive for sleep disturbance. Negative for depressed mood and confusion. The patient is nervous/anxious.     PMFS History:  Patient Active Problem List   Diagnosis Date Noted  . OSA on CPAP 04/24/2019  . Psoriasis 12/05/2018  .  Situational anxiety 12/05/2018  . Intermittent hypertension 12/05/2018    Past Medical History:  Diagnosis Date  . Abnormal uterine bleeding (AUB)   . Anxiety   . GERD (gastroesophageal reflux disease)   . History of chest pain (10-20-2019  pt denies any cardiac s&s)   mutliple ED visits, atypical chest pain, chest wall pain,  muscularskeletal chest pain;  pt had cardiology evaulation dated 04-23-2018 note in epic , state atypical chest wall pain with negative d dimer and troponin's multiple times in epic, suggested CTA  (pt did not get done)  . Hyperlipidemia   . IDA (iron deficiency anemia)   . OSA on CPAP    followed by dr Dia Sitter  . Psoriasis   . Psoriatic arthritis ([place])     Family History  Problem Relation Age of Onset  . Diabetes Maternal Grandmother   . Diabetes Paternal Grandmother   . Anxiety disorder Mother   . Psoriasis Sister   . Heart attack Brother 34       deceased from this  . Anxiety disorder Brother   . Cirrhosis Maternal Grandfather        alcohol related  . Allergy (severe) Son   . Healthy Son   . Colon cancer Neg Hx   . Esophageal cancer Neg Hx   . Colon polyps Neg Hx   . Stomach cancer Neg Hx   . Pancreatic cancer Neg Hx    Past Surgical History:  Procedure Laterality Date  . DILATATION & CURETTAGE/HYSTEROSCOPY WITH MYOSURE N/A 10/24/2019   Procedure: DILATATION & CURETTAGE/HYSTEROSCOPY WITH MYOSURE;  Surgeon: Joseph Pierini, MD;  Location: [facility];  Service: Gynecology;  Laterality: N/A;  . TUBAL LIGATION Bilateral 02/05/2013   Procedure: POST PARTUM TUBAL LIGATION;  Surgeon: Woodroe Mode, MD;  Location: [facility];  Service: Gynecology;  Laterality: Bilateral;    Filshie clips   Social History   Social History Narrative  . Not on file   Immunization History  Administered Date(s) Administered  . Rho (D) Immune Globulin 07/10/2012, 12/30/2012, 02/05/2013  . Tdap 02/06/2013     Objective: Vital Signs: BP (!) 147/92   Pulse 79   Resp 16   Ht 5' 5"  (1.651 m)   Wt 246 lb (111.6 kg)   LMP  (LMP Unknown) Comment: 10/24/2019 D&C  BMI 40.94 kg/m    Physical Exam Vitals and nursing note reviewed.  Constitutional:      Appearance: She is well-developed.  HENT:     Head: Normocephalic and atraumatic.  Eyes:     Conjunctiva/sclera: Conjunctivae normal.    Cardiovascular:     Rate and Rhythm: Normal rate and regular rhythm.     Heart sounds: Normal heart sounds.  Pulmonary:     Effort: Pulmonary effort is normal.     Breath sounds: Normal breath sounds.  Abdominal:     General: Bowel sounds are normal.     Palpations: Abdomen is soft.  Musculoskeletal:     Cervical back: Normal range of motion.  Lymphadenopathy:     Cervical: No cervical adenopathy.  Skin:    General: Skin is warm and dry.     Capillary Refill: Capillary refill takes less than 2 seconds.     Findings: Rash present.     Comments: Psoriasis patches on his scalp, ears, trunk and extremities.  Neurological:     Mental Status: She is alert and oriented to person, place, and time.  Psychiatric:  Behavior: Behavior normal.      Musculoskeletal Exam: C-spine, thoracic and lumbar spine were in good range of motion.  She had tenderness on palpation of bilateral SI joints.  Shoulder joints, elbow joints, wrist joints with good range of motion.  She has tenderness on palpation of bilateral wrist joints and PIPs but no synovitis was noted.  She had painful range of motion of bilateral knee joints with some warmth on palpation of her right knee joint.  She had tenderness on palpation of bilateral ankle joints.  She had tenderness over bilateral Achilles tendon and plantar fascia.  CDAI Exam: CDAI Score: -- Patient Global: --; Provider Global: -- Swollen: --; Tender: -- Joint Exam 12/12/2019   No joint exam has been documented for this visit   There is currently no information documented on the homunculus. Go to the Rheumatology activity and complete the homunculus joint exam.  Investigation: No additional findings.  Imaging: XR Foot 2 Views Left  Result Date: 12/12/2019 First MTP, PIP and DIP narrowing was noted.  No intertarsal tibiotalar joint space narrowing was noted.  No erosive changes were noted.  Inferior posterior calcaneal spurs were noted. Impression:  These findings are consistent with osteoarthritis of the foot.  XR Foot 2 Views Right  Result Date: 12/12/2019 First MTP, PIP and DIP narrowing was noted.  No intertarsal or tibiotalar joint space narrowing was noted.  No erosive changes were noted.  Inferior and posterior calcaneal spurs were noted. Impression: These findings are consistent with osteoarthritis of the foot.  XR Hand 2 View Left  Result Date: 12/12/2019 CMC, PIP and DIP narrowing was noted.  No MCP, intercarpal or radiocarpal joint space narrowing was noted.  No erosive changes were noted. Impression: These findings are consistent with osteoarthritis of the hand.  XR Hand 2 View Right  Result Date: 12/12/2019 Minimal PIP narrowing was noted.  No DIP, MCP, intercarpal or radiocarpal narrowing was noted.  No erosive changes were noted. Impression: These findings are consistent with osteoarthritis of the hand.  XR KNEE 3 VIEW LEFT  Result Date: 12/12/2019 Moderate medial compartment narrowing was noted.  Moderate patellofemoral narrowing was noted.  No chondrocalcinosis was noted. Impression: These findings are consistent with moderate osteoarthritis and moderate chondromalacia patella.  XR KNEE 3 VIEW RIGHT  Result Date: 12/12/2019 Moderate medial compartment narrowing was noted.  Moderate patellofemoral narrowing was noted.  No chondrocalcinosis was noted. Impression: These findings are consistent with moderate osteoarthritis and moderate chondromalacia patella.  XR Pelvis 1-2 Views  Result Date: 12/12/2019 Bilateral SI joint to sclerosis was noted.  More prominent on the right side.  No SI joint narrowing was noted.  No hip joint narrowing was noted. Impression: These findings are consistent with bilateral SI joint sclerosis more prominent on the right side.   Recent Labs: Lab Results  Component Value Date   WBC 12.4 (H) 11/13/2019   HGB 13.3 11/13/2019   PLT 398 11/13/2019   NA 139 11/13/2019   K 4.3 11/13/2019   CL 105  11/13/2019   CO2 18 (L) 11/13/2019   GLUCOSE 77 11/13/2019   BUN 9 11/13/2019   CREATININE 0.73 11/13/2019   BILITOT 0.4 11/13/2019   ALKPHOS 103 11/13/2019   AST 17 11/13/2019   ALT 24 11/13/2019   PROT 6.4 11/13/2019   ALBUMIN 4.5 11/13/2019   CALCIUM 9.4 11/13/2019   GFRAA 119 11/13/2019  October 13, 2019 labs from [facility] dermatology showed CMP normal, hepatitis B-, hepatitis C negative, HIV negative,  TB Gold negative  Speciality Comments: MTX- fatigue O[place]zla-inadequate response Cosentyx-denied by insurance  Procedures:  No procedures performed Allergies: Patient has no known allergies.   Assessment / Plan:     Visit Diagnoses: Psoriatic arthritis (HCC)-patient complains of pain in multiple joints.  She also has Achilles tendinitis and plantar fasciitis.  She has chronic SI joint pain consistent with sacroiliitis.  She has warmth on palpation of her right knee joint and some tenderness of the wrist joint and ankle joints.  I discussed the option of starting in her on Humira.  Indications side effects contraindications were discussed given.  Patient wants to proceed with Humira.  We will obtain some labs today and once labs are available we can apply for Humira.  Medication counseling:   TB Test: October 13, 2019 at [facility] dermatology Hepatitis panel: October 13, 2019 HIV: October 13, 2019 SPEP: Pending Immunoglobulins: Pending  Chest x-ray: August 05, 2019  Does patient have diagnosis of heart failure?  No  Counseled patient that Humira is a TNF blocking agent.  Reviewed Humira dose of 40 mg every other week.  Counseled patient on purpose, proper use, and adverse effects of Humira.  Reviewed the most common adverse effects including infections, headache, and injection site reactions. Discussed that there is the possibility of an increased risk of malignancy but it is not well understood if this increased risk is due to the medication or the disease state.  Advised patient to get  yearly dermatology exams due to risk of skin cancer.  Reviewed the importance of regular labs while on Humira therapy.  Advised patient to get standing labs one month after starting Humira then every 3 months.  Provided patient with standing lab orders.  Counseled patient that Humira should be held prior to scheduled surgery.  Counseled patient to avoid live vaccines while on Humira.  Advised patient to get annual influenza vaccine and the pneumococcal vaccine as needed.  Provided patient with medication education material and answered all questions.  Patient voiced understanding.  Patient consented to Humira.  Will upload consent into the media tab.  Reviewed storage instructions of Humira.  Advised initial injection must be administered in office.    Patient voiced understanding.     Psoriasis-she has extensive psoriasis on her trunk and extremities.  She is seeing Dr. April Manson.  She was given topical agents and light therapy was advised.  Light therapy was not covered by her insurance.  Cosentyx was applied which was also not covered by her insurance.  High risk medication use-patient had methotrexate in the past which caused some fatigue.  She states she was switched from methotrexate to [facility] which was discontinued due to the cost.  She had an adequate response to [place] when it was tried second time per patient.  Pain in both hands -she has pain and discomfort in her bilateral hands with extreme frequent swelling.  Plan: XR Hand 2 View Right, XR Hand 2 View Left.  X-rays of bilateral hands were consistent with osteoarthritis.  Chronic SI joint pain -she complains of morning stiffness and discomfort in her SI joints.  Plan: XR Pelvis 1-2 Views SI joint to sclerosis was noted.  Chronic pain of both knees -she has frequent pain in her knee joints.  She had warmth on palpation of her right knee joint today.  Plan: XR KNEE 3 VIEW RIGHT, XR KNEE 3 VIEW LEFT.  X-ray showed bilateral moderate osteoarthritis  and moderate chondromalacia patella.  Pain in both feet -  she had tenderness on palpation of her bilateral ankle joints.  She has tenderness over Achilles tendon and plantar fascia.  Plan: XR Foot 2 Views Right, XR Foot 2 Views Left.  X-rays were consistent with osteoarthritis of bilateral feet.  Family history of psoriasis in sister - And first cousin.  Intermittent hypertension-blood pressure is elevated.  BMI-40.94.  Weight loss and dietary modifications were discussed.  OSA on CPAP  History of hyperlipidemia  History of iron deficiency anemia  History of anxiety-she is on Xanax and Lexapro.  Orders: Orders Placed This Encounter  Procedures  . XR Pelvis 1-2 Views  . XR KNEE 3 VIEW RIGHT  . XR KNEE 3 VIEW LEFT  . XR Hand 2 View Right  . XR Hand 2 View Left  . XR Foot 2 Views Right  . XR Foot 2 Views Left  . CBC with Differential/Platelet  . Sedimentation rate  . CK  . ANA  . Serum protein electrophoresis with reflex  . IgG, IgA, IgM  . Rheumatoid factor  . Cyclic citrul peptide antibody, IgG   No orders of the defined types were placed in this encounter.   Face-to-face time spent with patient was 50 minutes. Greater than 50% of time was spent in counseling and coordination of care.  Follow-Up Instructions: Return for Psoriatic arthritis.   Bo Merino, MD  Note - This record has been created using Editor, commissioning.  Chart creation errors have been sought, but may not always  have been located. Such creation errors do not reflect on  the standard of medical care.

## 2019-12-03 ENCOUNTER — Encounter: Payer: Self-pay | Admitting: Gynecology

## 2019-12-12 ENCOUNTER — Ambulatory Visit: Payer: Self-pay

## 2019-12-12 ENCOUNTER — Encounter: Payer: Self-pay | Admitting: Rheumatology

## 2019-12-12 ENCOUNTER — Ambulatory Visit (INDEPENDENT_AMBULATORY_CARE_PROVIDER_SITE_OTHER): Payer: 59 | Admitting: Rheumatology

## 2019-12-12 ENCOUNTER — Other Ambulatory Visit: Payer: Self-pay

## 2019-12-12 VITALS — BP 147/92 | HR 79 | Resp 16 | Ht 65.0 in | Wt 246.0 lb

## 2019-12-12 DIAGNOSIS — M79642 Pain in left hand: Secondary | ICD-10-CM | POA: Diagnosis not present

## 2019-12-12 DIAGNOSIS — G4733 Obstructive sleep apnea (adult) (pediatric): Secondary | ICD-10-CM

## 2019-12-12 DIAGNOSIS — L409 Psoriasis, unspecified: Secondary | ICD-10-CM | POA: Diagnosis not present

## 2019-12-12 DIAGNOSIS — M25561 Pain in right knee: Secondary | ICD-10-CM

## 2019-12-12 DIAGNOSIS — Z8659 Personal history of other mental and behavioral disorders: Secondary | ICD-10-CM

## 2019-12-12 DIAGNOSIS — M79641 Pain in right hand: Secondary | ICD-10-CM

## 2019-12-12 DIAGNOSIS — G8929 Other chronic pain: Secondary | ICD-10-CM

## 2019-12-12 DIAGNOSIS — Z6841 Body Mass Index (BMI) 40.0 and over, adult: Secondary | ICD-10-CM

## 2019-12-12 DIAGNOSIS — M79672 Pain in left foot: Secondary | ICD-10-CM | POA: Diagnosis not present

## 2019-12-12 DIAGNOSIS — M533 Sacrococcygeal disorders, not elsewhere classified: Secondary | ICD-10-CM | POA: Diagnosis not present

## 2019-12-12 DIAGNOSIS — M79671 Pain in right foot: Secondary | ICD-10-CM

## 2019-12-12 DIAGNOSIS — M25562 Pain in left knee: Secondary | ICD-10-CM | POA: Diagnosis not present

## 2019-12-12 DIAGNOSIS — Z79899 Other long term (current) drug therapy: Secondary | ICD-10-CM | POA: Diagnosis not present

## 2019-12-12 DIAGNOSIS — L4050 Arthropathic psoriasis, unspecified: Secondary | ICD-10-CM | POA: Diagnosis not present

## 2019-12-12 DIAGNOSIS — Z9989 Dependence on other enabling machines and devices: Secondary | ICD-10-CM

## 2019-12-12 DIAGNOSIS — Z840 Family history of diseases of the skin and subcutaneous tissue: Secondary | ICD-10-CM

## 2019-12-12 DIAGNOSIS — Z862 Personal history of diseases of the blood and blood-forming organs and certain disorders involving the immune mechanism: Secondary | ICD-10-CM

## 2019-12-12 DIAGNOSIS — Z8639 Personal history of other endocrine, nutritional and metabolic disease: Secondary | ICD-10-CM

## 2019-12-12 DIAGNOSIS — I10 Essential (primary) hypertension: Secondary | ICD-10-CM

## 2019-12-12 NOTE — Patient Instructions (Addendum)
Standing Labs We placed an order today for your standing lab work.    Please come back and get your standing labs in 1 month after starting Humira and then every 3 months.  We have open lab daily Monday through Thursday from 8:30-12:30 PM and 1:30-4:30 PM and Friday from 8:30-12:30 PM and 1:30-4:00 PM at the office of Dr. Bo Merino.   You may experience shorter wait times on Monday and Friday afternoons. The office is located at [address] [place], [place], [place] [address] No appointment is necessary.   Labs are drawn by [facility].  You may receive a bill from [facility] for your lab work.  If you wish to have your labs drawn at another location, please call the office 24 hours in advance to send orders.  If you have any questions regarding directions or hours of operation,  please call [phone].   Just as a reminder please drink plenty of water prior to coming for your lab work. Thanks!    Adalimumab Injection What is this medicine? ADALIMUMAB (a dal AYE mu mab) is used to treat rheumatoid and psoriatic arthritis. It is also used to treat ankylosing spondylitis, Crohn's disease, ulcerative colitis, plaque psoriasis, hidradenitis suppurativa, and uveitis. This medicine may be used for other purposes; ask your health care provider or pharmacist if you have questions. COMMON BRAND NAME(S): CYLTEZO, Humira What should I tell my health care provider before I take this medicine? They need to know if you have any of these conditions:  diabetes  heart disease  hepatitis B or history of hepatitis B infection  immune system problems  infection or history of infections  multiple sclerosis  recently received or scheduled to receive a vaccine  scheduled to have surgery  tuberculosis, a positive skin test for tuberculosis or have recently been in close contact with someone who has tuberculosis  an unusual reaction to adalimumab, other medicines, mannitol, latex,  rubber, foods, dyes, or preservatives  pregnant or trying to get pregnant  breast-feeding How should I use this medicine? This medicine is for injection under the skin. You will be taught how to prepare and give this medicine. Use exactly as directed. Take your medicine at regular intervals. Do not take your medicine more often than directed. A special MedGuide will be given to you by the pharmacist with each prescription and refill. Be sure to read this information carefully each time. It is important that you put your used needles and syringes in a special sharps container. Do not put them in a trash can. If you do not have a sharps container, call your pharmacist or healthcare provider to get one. Talk to your pediatrician regarding the use of this medicine in children. While this drug may be prescribed for children as young as 2 years for selected conditions, precautions do apply. The manufacturer of the medicine offers free information to patients and their health care partners. Call 5054329902 for more information. Overdosage: If you think you have taken too much of this medicine contact a poison control center or emergency room at once. NOTE: This medicine is only for you. Do not share this medicine with others. What if I miss a dose? If you miss a dose, take it as soon as you can. If it is almost time for your next dose, take only that dose. Do not take double or extra doses. Give the next dose when your next scheduled dose is due. Call your doctor or health care professional if  you are not sure how to handle a missed dose. What may interact with this medicine? Do not take this medicine with any of the following medications:  abatacept  anakinra  etanercept  infliximab  live virus vaccines  rilonacept This medicine may also interact with the following medications:  vaccines This list may not describe all possible interactions. Give your health care provider a list of all  the medicines, herbs, non-prescription drugs, or dietary supplements you use. Also tell them if you smoke, drink alcohol, or use illegal drugs. Some items may interact with your medicine. What should I watch for while using this medicine? Visit your doctor or health care professional for regular checks on your progress. Tell your doctor or healthcare professional if your symptoms do not start to get better or if they get worse. You will be tested for tuberculosis (TB) before you start this medicine. If your doctor prescribes any medicine for TB, you should start taking the TB medicine before starting this medicine. Make sure to finish the full course of TB medicine. Call your doctor or health care professional if you get a cold or other infection while receiving this medicine. Do not treat yourself. This medicine may decrease your body's ability to fight infection. Talk to your doctor about your risk of cancer. You may be more at risk for certain types of cancers if you take this medicine. What side effects may I notice from receiving this medicine? Side effects that you should report to your doctor or health care professional as soon as possible:  allergic reactions like skin rash, itching or hives, swelling of the face, lips, or tongue  breathing problems  changes in vision  chest pain  fever, chills, or any other sign of infection  numbness or tingling  red, scaly patches or raised bumps on the skin  swelling of the ankles  swollen lymph nodes in the neck, underarm, or groin areas  unexplained weight loss  unusual bleeding or bruising  unusually weak or tired Side effects that usually do not require medical attention (report to your doctor or health care professional if they continue or are bothersome):  headache  nausea  redness, itching, swelling, or bruising at site where injected This list may not describe all possible side effects. Call your doctor for medical advice  about side effects. You may report side effects to FDA at 1-800-FDA-1088. Where should I keep my medicine? Keep out of the reach of children. Store in the original container and in the refrigerator between 2 and 8 degrees C (36 and 46 degrees F). Do not freeze. The product may be stored in a cool carrier with an ice pack, if needed. Protect from light. Throw away any unused medicine after the expiration date. NOTE: This sheet is a summary. It may not cover all possible information. If you have questions about this medicine, talk to your doctor, pharmacist, or health care provider.  2020 Elsevier/Gold Standard (2018-04-15 13:22:46)

## 2019-12-15 ENCOUNTER — Telehealth: Payer: Self-pay

## 2019-12-15 NOTE — Telephone Encounter (Signed)
Submitted a Prior Authorization request to [facility] for [place] via Cover My Meds. Will update once we receive a response.   (Key: [id number]) - [id number]

## 2019-12-15 NOTE — Telephone Encounter (Signed)
Please apply for Humira, per Dr. Estanislado Pandy. Patient was seen on 12/12/2019 and consented.

## 2019-12-16 NOTE — Telephone Encounter (Signed)
Received notification from [facility] regarding a prior authorization for HUMIRA. Authorization has been APPROVED from 12/16/19 to 03/15/20.   Authorization #  [id number] Phone # [phone]  Per plan pateint must fill through [place]. Patient can use a copay card.  Card: [id number]  Issued: 12/16/2019  Rx GROUP: [id number]  Rx BIN: [id number]  Rx PCN: [id number]  Suf: 01

## 2019-12-19 NOTE — Telephone Encounter (Signed)
Patient approved for Humira.  All labs up to date except [place] and SPEP which have active orders along with other autoimmune labs.  Okay to proceed with Humira new start visit?

## 2019-12-19 NOTE — Telephone Encounter (Signed)
Dr. Estanislado Pandy would like the patient to have lab work drawn/resulted prior to scheduling the new start visit.

## 2019-12-19 NOTE — Telephone Encounter (Signed)
Patient did not get her labs drawn.  We will request her to get labs.

## 2019-12-19 NOTE — Telephone Encounter (Signed)
Noted.  New Start pending Lab results.

## 2019-12-23 ENCOUNTER — Other Ambulatory Visit: Payer: Self-pay | Admitting: Gastroenterology

## 2019-12-25 ENCOUNTER — Other Ambulatory Visit: Payer: Self-pay | Admitting: Emergency Medicine

## 2019-12-25 DIAGNOSIS — F418 Other specified anxiety disorders: Secondary | ICD-10-CM

## 2019-12-25 NOTE — Telephone Encounter (Signed)
Patient is requesting a refill of the following medications: Requested Prescriptions   Pending Prescriptions Disp Refills   ALPRAZolam (XANAX) 0.5 MG tablet [Pharmacy Med Name: ALPRAZolam 0.5 MG Oral Tablet] 30 tablet 0    Sig: TAKE 1 TABLET BY MOUTH ONCE DAILY AS NEEDED FOR ANXIETY   DR. Sagarda's patient  Date of patient request: 12/25/19 Last office visit: 11/13/19 Date of last refill:09/11/19 Last refill amount: 30 Follow up time period per chart: 03/18/2020

## 2019-12-25 NOTE — Telephone Encounter (Signed)
Pmp reviewed Med refilled Covering for PCP who is OOTO

## 2020-01-01 ENCOUNTER — Telehealth: Payer: Self-pay | Admitting: *Deleted

## 2020-01-01 NOTE — Telephone Encounter (Signed)
Spoke to patient concerning paper work from [employer] pending for [employer] 28 Day starter pack [facility]). The patient stated she had the appointments with Dermatology and Rheumatology and was started on Stel[place]. The paper work from [employer] she does not need and it will put in shred.

## 2020-01-04 NOTE — Progress Notes (Deleted)
Office Visit Note  Patient: Theresa Sawyer             Date of Birth: August 26, 1980           MRN: [id number]             PCP: Horald Pollen, MD Referring: Horald Pollen, * Visit Date: 01/08/2020 Occupation: @GUAROCC @  Subjective:  No chief complaint on file.   History of Present Illness: Theresa Sawyer is a 39 y.o. female ***   Activities of Daily Living:  Patient reports morning stiffness for *** {minute/hour:19697}.   Patient {ACTIONS;DENIES/REPORTS:21021675::"Denies"} nocturnal pain.  Difficulty dressing/grooming: {ACTIONS;DENIES/REPORTS:21021675::"Denies"} Difficulty climbing stairs: {ACTIONS;DENIES/REPORTS:21021675::"Denies"} Difficulty getting out of chair: {ACTIONS;DENIES/REPORTS:21021675::"Denies"} Difficulty using hands for taps, buttons, cutlery, and/or writing: {ACTIONS;DENIES/REPORTS:21021675::"Denies"}  No Rheumatology ROS completed.   PMFS History:  Patient Active Problem List   Diagnosis Date Noted  . OSA on CPAP 04/24/2019  . Psoriasis 12/05/2018  . Situational anxiety 12/05/2018  . Intermittent hypertension 12/05/2018    Past Medical History:  Diagnosis Date  . Abnormal uterine bleeding (AUB)   . Anxiety   . GERD (gastroesophageal reflux disease)   . History of chest pain (10-20-2019  pt denies any cardiac s&s)   mutliple ED visits, atypical chest pain, chest wall pain, muscularskeletal chest pain;  pt had cardiology evaulation dated 04-23-2018 note in epic , state atypical chest wall pain with negative d dimer and troponin's multiple times in epic, suggested CTA  (pt did not get done)  . Hyperlipidemia   . IDA (iron deficiency anemia)   . OSA on CPAP    followed by dr Dia Sitter  . Psoriasis   . Psoriatic arthritis ([place])     Family History  Problem Relation Age of Onset  . Diabetes Maternal Grandmother   . Diabetes Paternal Grandmother   . Anxiety disorder Mother   . Psoriasis Sister   . Heart attack Brother 34       deceased from this   . Anxiety disorder Brother   . Cirrhosis Maternal Grandfather        alcohol related  . Allergy (severe) Son   . Healthy Son   . Colon cancer Neg Hx   . Esophageal cancer Neg Hx   . Colon polyps Neg Hx   . Stomach cancer Neg Hx   . Pancreatic cancer Neg Hx    Past Surgical History:  Procedure Laterality Date  . DILATATION & CURETTAGE/HYSTEROSCOPY WITH MYOSURE N/A 10/24/2019   Procedure: DILATATION & CURETTAGE/HYSTEROSCOPY WITH MYOSURE;  Surgeon: Joseph Pierini, MD;  Location: [facility];  Service: Gynecology;  Laterality: N/A;  . TUBAL LIGATION Bilateral 02/05/2013   Procedure: POST PARTUM TUBAL LIGATION;  Surgeon: Woodroe Mode, MD;  Location: [facility];  Service: Gynecology;  Laterality: Bilateral;    Filshie clips   Social History   Social History Narrative  . Not on file   Immunization History  Administered Date(s) Administered  . Rho (D) Immune Globulin 07/10/2012, 12/30/2012, 02/05/2013  . Tdap 02/06/2013     Objective: Vital Signs: There were no vitals taken for this visit.   Physical Exam   Musculoskeletal Exam: ***  CDAI Exam: CDAI Score: -- Patient Global: --; Provider Global: -- Swollen: --; Tender: -- Joint Exam 01/08/2020   No joint exam has been documented for this visit   There is currently no information documented on the homunculus. Go to the Rheumatology activity and complete the homunculus joint exam.  Investigation: No additional findings.  Imaging: XR Foot 2 Views Left  Result Date: 12/12/2019 First MTP, PIP and DIP narrowing was noted.  No intertarsal tibiotalar joint space narrowing was noted.  No erosive changes were noted.  Inferior posterior calcaneal spurs were noted. Impression: These findings are consistent with osteoarthritis of the foot.  XR Foot 2 Views Right  Result Date: 12/12/2019 First MTP, PIP and DIP narrowing was noted.  No intertarsal or tibiotalar joint space narrowing was noted.  No erosive changes were  noted.  Inferior and posterior calcaneal spurs were noted. Impression: These findings are consistent with osteoarthritis of the foot.  XR Hand 2 View Left  Result Date: 12/12/2019 CMC, PIP and DIP narrowing was noted.  No MCP, intercarpal or radiocarpal joint space narrowing was noted.  No erosive changes were noted. Impression: These findings are consistent with osteoarthritis of the hand.  XR Hand 2 View Right  Result Date: 12/12/2019 Minimal PIP narrowing was noted.  No DIP, MCP, intercarpal or radiocarpal narrowing was noted.  No erosive changes were noted. Impression: These findings are consistent with osteoarthritis of the hand.  XR KNEE 3 VIEW LEFT  Result Date: 12/12/2019 Moderate medial compartment narrowing was noted.  Moderate patellofemoral narrowing was noted.  No chondrocalcinosis was noted. Impression: These findings are consistent with moderate osteoarthritis and moderate chondromalacia patella.  XR KNEE 3 VIEW RIGHT  Result Date: 12/12/2019 Moderate medial compartment narrowing was noted.  Moderate patellofemoral narrowing was noted.  No chondrocalcinosis was noted. Impression: These findings are consistent with moderate osteoarthritis and moderate chondromalacia patella.  XR Pelvis 1-2 Views  Result Date: 12/12/2019 Bilateral SI joint to sclerosis was noted.  More prominent on the right side.  No SI joint narrowing was noted.  No hip joint narrowing was noted. Impression: These findings are consistent with bilateral SI joint sclerosis more prominent on the right side.   Recent Labs: Lab Results  Component Value Date   WBC 12.4 (H) 11/13/2019   HGB 13.3 11/13/2019   PLT 398 11/13/2019   NA 139 11/13/2019   K 4.3 11/13/2019   CL 105 11/13/2019   CO2 18 (L) 11/13/2019   GLUCOSE 77 11/13/2019   BUN 9 11/13/2019   CREATININE 0.73 11/13/2019   BILITOT 0.4 11/13/2019   ALKPHOS 103 11/13/2019   AST 17 11/13/2019   ALT 24 11/13/2019   PROT 6.4 11/13/2019   ALBUMIN 4.5  11/13/2019   CALCIUM 9.4 11/13/2019   GFRAA 119 11/13/2019    Speciality Comments: MTX- fatigue Otezla-inadequate response Cosentyx-denied by insurance  Procedures:  No procedures performed Allergies: Patient has no known allergies.   Assessment / Plan:     Visit Diagnoses: Psoriatic arthritis ([place])  Psoriasis  Chronic SI joint pain - Bilateral SI joint sclerosis noted on x-rays.  High risk medication use  Primary osteoarthritis of both hands  Primary osteoarthritis of both knees - Moderate osteoarthritis and moderate chondromalacia patella.  Primary osteoarthritis of both feet  Family history of psoriasis in sister  History of hyperlipidemia  Essential hypertension  OSA on CPAP  History of anxiety  History of iron deficiency anemia  BMI 40.0-44.9, adult ([place])  Orders: No orders of the defined types were placed in this encounter.  No orders of the defined types were placed in this encounter.   Face-to-face time spent with patient was *** minutes. Greater than 50% of time was spent in counseling and coordination of care.  Follow-Up Instructions: No follow-ups on file.   Bo Merino, MD  Note -  This record has been created using [employer].  Chart creation errors have been sought, but may not always  have been located. Such creation errors do not reflect on  the standard of medical care.

## 2020-01-06 NOTE — Progress Notes (Deleted)
Office Visit Note  Patient: Theresa Sawyer             Date of Birth: May 18, 1981           MRN: [id number]             PCP: Horald Pollen, MD Referring: Horald Pollen, * Visit Date: 01/13/2020 Occupation: @GUAROCC @  Subjective:  No chief complaint on file.   History of Present Illness: SHAWNNA Sawyer is a 39 y.o. female ***   Activities of Daily Living:  Patient reports morning stiffness for *** {minute/hour:19697}.   Patient {ACTIONS;DENIES/REPORTS:21021675::"Denies"} nocturnal pain.  Difficulty dressing/grooming: {ACTIONS;DENIES/REPORTS:21021675::"Denies"} Difficulty climbing stairs: {ACTIONS;DENIES/REPORTS:21021675::"Denies"} Difficulty getting out of chair: {ACTIONS;DENIES/REPORTS:21021675::"Denies"} Difficulty using hands for taps, buttons, cutlery, and/or writing: {ACTIONS;DENIES/REPORTS:21021675::"Denies"}  No Rheumatology ROS completed.   PMFS History:  Patient Active Problem List   Diagnosis Date Noted   OSA on CPAP 04/24/2019   Psoriasis 12/05/2018   Situational anxiety 12/05/2018   Intermittent hypertension 12/05/2018    Past Medical History:  Diagnosis Date   Abnormal uterine bleeding (AUB)    Anxiety    GERD (gastroesophageal reflux disease)    History of chest pain (10-20-2019  pt denies any cardiac s&s)   mutliple ED visits, atypical chest pain, chest wall pain, muscularskeletal chest pain;  pt had cardiology evaulation dated 04-23-2018 note in epic , state atypical chest wall pain with negative d dimer and troponin's multiple times in epic, suggested CTA  (pt did not get done)   Hyperlipidemia    IDA (iron deficiency anemia)    OSA on CPAP    followed by dr Dia Sitter   Psoriasis    Psoriatic arthritis ([place])     Family History  Problem Relation Age of Onset   Diabetes Maternal Grandmother    Diabetes Paternal Grandmother    Anxiety disorder Mother    Psoriasis Sister    Heart attack Brother 27       deceased from this    Anxiety disorder Brother    Cirrhosis Maternal Grandfather        alcohol related   Allergy (severe) Son    Healthy Son    Colon cancer Neg Hx    Esophageal cancer Neg Hx    Colon polyps Neg Hx    Stomach cancer Neg Hx    Pancreatic cancer Neg Hx    Past Surgical History:  Procedure Laterality Date   DILATATION & CURETTAGE/HYSTEROSCOPY WITH MYOSURE N/A 10/24/2019   Procedure: DILATATION & CURETTAGE/HYSTEROSCOPY WITH MYOSURE;  Surgeon: Joseph Pierini, MD;  Location: [facility];  Service: Gynecology;  Laterality: N/A;   TUBAL LIGATION Bilateral 02/05/2013   Procedure: POST PARTUM TUBAL LIGATION;  Surgeon: Woodroe Mode, MD;  Location: [facility];  Service: Gynecology;  Laterality: Bilateral;    Filshie clips   Social History   Social History Narrative   Not on file   Immunization History  Administered Date(s) Administered   Rho (D) Immune Globulin 07/10/2012, 12/30/2012, 02/05/2013   Tdap 02/06/2013     Objective: Vital Signs: There were no vitals taken for this visit.   Physical Exam   Musculoskeletal Exam: ***  CDAI Exam: CDAI Score: -- Patient Global: --; Provider Global: -- Swollen: --; Tender: -- Joint Exam 01/13/2020   No joint exam has been documented for this visit   There is currently no information documented on the homunculus. Go to the Rheumatology activity and complete the homunculus joint exam.  Investigation: No additional findings.  Imaging: XR Foot 2 Views Left  Result Date: 12/12/2019 First MTP, PIP and DIP narrowing was noted.  No intertarsal tibiotalar joint space narrowing was noted.  No erosive changes were noted.  Inferior posterior calcaneal spurs were noted. Impression: These findings are consistent with osteoarthritis of the foot.  XR Foot 2 Views Right  Result Date: 12/12/2019 First MTP, PIP and DIP narrowing was noted.  No intertarsal or tibiotalar joint space narrowing was noted.  No erosive changes were  noted.  Inferior and posterior calcaneal spurs were noted. Impression: These findings are consistent with osteoarthritis of the foot.  XR Hand 2 View Left  Result Date: 12/12/2019 CMC, PIP and DIP narrowing was noted.  No MCP, intercarpal or radiocarpal joint space narrowing was noted.  No erosive changes were noted. Impression: These findings are consistent with osteoarthritis of the hand.  XR Hand 2 View Right  Result Date: 12/12/2019 Minimal PIP narrowing was noted.  No DIP, MCP, intercarpal or radiocarpal narrowing was noted.  No erosive changes were noted. Impression: These findings are consistent with osteoarthritis of the hand.  XR KNEE 3 VIEW LEFT  Result Date: 12/12/2019 Moderate medial compartment narrowing was noted.  Moderate patellofemoral narrowing was noted.  No chondrocalcinosis was noted. Impression: These findings are consistent with moderate osteoarthritis and moderate chondromalacia patella.  XR KNEE 3 VIEW RIGHT  Result Date: 12/12/2019 Moderate medial compartment narrowing was noted.  Moderate patellofemoral narrowing was noted.  No chondrocalcinosis was noted. Impression: These findings are consistent with moderate osteoarthritis and moderate chondromalacia patella.  XR Pelvis 1-2 Views  Result Date: 12/12/2019 Bilateral SI joint to sclerosis was noted.  More prominent on the right side.  No SI joint narrowing was noted.  No hip joint narrowing was noted. Impression: These findings are consistent with bilateral SI joint sclerosis more prominent on the right side.   Recent Labs: Lab Results  Component Value Date   WBC 12.4 (H) 11/13/2019   HGB 13.3 11/13/2019   PLT 398 11/13/2019   NA 139 11/13/2019   K 4.3 11/13/2019   CL 105 11/13/2019   CO2 18 (L) 11/13/2019   GLUCOSE 77 11/13/2019   BUN 9 11/13/2019   CREATININE 0.73 11/13/2019   BILITOT 0.4 11/13/2019   ALKPHOS 103 11/13/2019   AST 17 11/13/2019   ALT 24 11/13/2019   PROT 6.4 11/13/2019   ALBUMIN 4.5  11/13/2019   CALCIUM 9.4 11/13/2019   GFRAA 119 11/13/2019    Speciality Comments: MTX- fatigue Otezla-inadequate response Cosentyx-denied by insurance  Procedures:  No procedures performed Allergies: Patient has no known allergies.   Assessment / Plan:     Visit Diagnoses: No diagnosis found.  Orders: No orders of the defined types were placed in this encounter.  No orders of the defined types were placed in this encounter.   Face-to-face time spent with patient was *** minutes. Greater than 50% of time was spent in counseling and coordination of care.  Follow-Up Instructions: No follow-ups on file.   Bo Merino, MD  Note - This record has been created using [employer].  Chart creation errors have been sought, but may not always  have been located. Such creation errors do not reflect on  the standard of medical care.

## 2020-01-07 ENCOUNTER — Telehealth: Payer: Self-pay

## 2020-01-07 NOTE — Telephone Encounter (Signed)
Patient called and wants to cancel her surgery for 01/13/20. Said she cannot afford the time out of work right now.  She is not vaccinated for Covid-19 so still the 4 day quarantine protocol is required for surgery.  She will call us back when it works out for her to have surgery.  I will cancel Covid test, consult and surgery at her request.

## 2020-01-08 ENCOUNTER — Ambulatory Visit: Payer: 59 | Admitting: Obstetrics and Gynecology

## 2020-01-08 ENCOUNTER — Ambulatory Visit: Payer: 59 | Admitting: Rheumatology

## 2020-01-09 ENCOUNTER — Other Ambulatory Visit (HOSPITAL_COMMUNITY): Payer: 59

## 2020-01-13 ENCOUNTER — Ambulatory Visit: Payer: 59 | Admitting: Rheumatology

## 2020-01-13 ENCOUNTER — Ambulatory Visit (HOSPITAL_BASED_OUTPATIENT_CLINIC_OR_DEPARTMENT_OTHER): Admission: RE | Admit: 2020-01-13 | Payer: 59 | Source: Home / Self Care | Admitting: Obstetrics and Gynecology

## 2020-01-13 ENCOUNTER — Encounter (HOSPITAL_BASED_OUTPATIENT_CLINIC_OR_DEPARTMENT_OTHER): Admission: RE | Payer: Self-pay | Source: Home / Self Care

## 2020-01-13 SURGERY — LAPAROSCOPY, DIAGNOSTIC
Anesthesia: General

## 2020-01-20 ENCOUNTER — Encounter: Payer: Self-pay | Admitting: Family Medicine

## 2020-01-21 ENCOUNTER — Telehealth: Payer: Self-pay | Admitting: Family Medicine

## 2020-01-21 NOTE — Telephone Encounter (Signed)
cpap download printed for review.

## 2020-01-21 NOTE — Telephone Encounter (Signed)
Patient called stating she would like to get the pressure checked on her CPAP.

## 2020-01-21 NOTE — Telephone Encounter (Signed)
I called LMVM for pt message regarding download report.  I will also send mychart as well.  Continue using as specified, unless other specific concerns.

## 2020-01-21 NOTE — Telephone Encounter (Signed)
Please inquire if she has any specific concerns with regards to her AutoPap.  I reviewed her compliance data for the past month and she is fully compliant, average usage of 8 hours and 2 minutes, average AHI at goal at 2.1, leak on the acceptable but higher side and average pressure of 14.7 cm, range of 7-15.  From my end of things, everything looks very good, if she is doing well, she can continue with the treatment at the current settings and follow-up with [place] as scheduled.

## 2020-01-24 DIAGNOSIS — Z6841 Body Mass Index (BMI) 40.0 and over, adult: Secondary | ICD-10-CM | POA: Insufficient documentation

## 2020-01-24 DIAGNOSIS — M170 Bilateral primary osteoarthritis of knee: Secondary | ICD-10-CM | POA: Insufficient documentation

## 2020-01-24 DIAGNOSIS — Z862 Personal history of diseases of the blood and blood-forming organs and certain disorders involving the immune mechanism: Secondary | ICD-10-CM | POA: Insufficient documentation

## 2020-01-24 DIAGNOSIS — Z8639 Personal history of other endocrine, nutritional and metabolic disease: Secondary | ICD-10-CM | POA: Insufficient documentation

## 2020-01-24 DIAGNOSIS — L4050 Arthropathic psoriasis, unspecified: Secondary | ICD-10-CM | POA: Insufficient documentation

## 2020-01-24 NOTE — Progress Notes (Signed)
Office Visit Note  Patient: Theresa Sawyer             Date of Birth: 01-Dec-1980           MRN: [id number]             PCP: Horald Pollen, MD Referring: Horald Pollen, * Visit Date: 01/26/2020 Occupation: _0 @  Subjective:  Pain in multiple joints   History of Present Illness: Theresa Sawyer is a 39 y.o. female with history of psoriatic arthritis.  Patient presents today to discuss treatment options and to obtain updated lab work.  Patient ports that she was previously on [place] and methotrexate but had to discontinue due to side effects and insurance issues in the past.  She has been off of [place] for about a year and a half.  She continues to have recurrent flares.  She is having pain and inflammation in both wrist joints, both hands, both knees, and both feet.  She is also having Achilles tendinitis bilaterally.  She has lower back pain but denies any symptoms of radiculopathy.  She has been taking ibuprofen as needed for pain relief.  She continues to have extensive psoriasis on both elbows and her lower extremities.  She has been using a topical agent twice daily for 2 weeks and then takes a break.  She is followed by her dermatologist    Activities of Daily Living:  Patient reports joint stiffness all day Patient Reports nocturnal pain.  Difficulty dressing/grooming: Denies Difficulty climbing stairs: Reports Difficulty getting out of chair: Reports Difficulty using hands for taps, buttons, cutlery, and/or writing: Reports  Review of Systems  Constitutional: Positive for fatigue.  HENT: Negative for mouth sores, mouth dryness and nose dryness.   Eyes: Negative for itching and dryness.  Respiratory: Negative for shortness of breath and difficulty breathing.   Cardiovascular: Negative for chest pain and palpitations.  Gastrointestinal: Negative for blood in stool, constipation and diarrhea.  Endocrine: Negative for increased urination.  Genitourinary:  Negative for difficulty urinating.  Musculoskeletal: Positive for arthralgias, joint pain, joint swelling, morning stiffness and muscle tenderness.  Skin: Positive for rash. Negative for color change and hair loss.  Allergic/Immunologic: Negative for susceptible to infections.  Neurological: Positive for numbness, headaches, parasthesias and weakness. Negative for dizziness and memory loss.  Hematological: Negative for bruising/bleeding tendency.  Psychiatric/Behavioral: Negative for confusion.    PMFS History:  Patient Active Problem List   Diagnosis Date Noted  . Psoriatic arthritis ([place]) 01/24/2020  . Primary osteoarthritis of both knees 01/24/2020  . History of hyperlipidemia 01/24/2020  . History of iron deficiency anemia 01/24/2020  . BMI 40.0-44.9, adult ([place]) 01/24/2020  . OSA on CPAP 04/24/2019  . Psoriasis 12/05/2018  . Situational anxiety 12/05/2018  . Intermittent hypertension 12/05/2018    Past Medical History:  Diagnosis Date  . Abnormal uterine bleeding (AUB)   . Anxiety   . GERD (gastroesophageal reflux disease)   . History of chest pain (10-20-2019  pt denies any cardiac s&s)   mutliple ED visits, atypical chest pain, chest wall pain, muscularskeletal chest pain;  pt had cardiology evaulation dated 04-23-2018 note in epic , state atypical chest wall pain with negative d dimer and troponin's multiple times in epic, suggested CTA  (pt did not get done)  . Hyperlipidemia   . IDA (iron deficiency anemia)   . OSA on CPAP    followed by dr Dia Sitter  . Psoriasis   . Psoriatic arthritis ([place])  Family History  Problem Relation Age of Onset  . Diabetes Maternal Grandmother   . Diabetes Paternal Grandmother   . Anxiety disorder Mother   . Psoriasis Sister   . Heart attack Brother 34       deceased from this  . Anxiety disorder Brother   . Cirrhosis Maternal Grandfather        alcohol related  . Allergy (severe) Son   . Healthy Son   . Colon cancer Neg Hx   .  Esophageal cancer Neg Hx   . Colon polyps Neg Hx   . Stomach cancer Neg Hx   . Pancreatic cancer Neg Hx    Past Surgical History:  Procedure Laterality Date  . DILATATION & CURETTAGE/HYSTEROSCOPY WITH MYOSURE N/A 10/24/2019   Procedure: DILATATION & CURETTAGE/HYSTEROSCOPY WITH MYOSURE;  Surgeon: Joseph Pierini, MD;  Location: [facility];  Service: Gynecology;  Laterality: N/A;  . TUBAL LIGATION Bilateral 02/05/2013   Procedure: POST PARTUM TUBAL LIGATION;  Surgeon: Woodroe Mode, MD;  Location: [facility];  Service: Gynecology;  Laterality: Bilateral;    Filshie clips   Social History   Social History Narrative  . Not on file   Immunization History  Administered Date(s) Administered  . Rho (D) Immune Globulin 07/10/2012, 12/30/2012, 02/05/2013  . Tdap 02/06/2013     Objective: Vital Signs: BP (!) 154/103 (BP Location: Left Arm, Patient Position: Sitting, Cuff Size: Normal)   Pulse 71   Resp 17   Ht _0  (1.651 m)   Wt 252 lb (114.3 kg)   BMI 41.93 kg/m    Physical Exam Vitals and nursing note reviewed.  Constitutional:      Appearance: She is well-developed.  HENT:     Head: Normocephalic and atraumatic.  Eyes:     Conjunctiva/sclera: Conjunctivae normal.  Pulmonary:     Effort: Pulmonary effort is normal.  Abdominal:     General: Bowel sounds are normal.     Palpations: Abdomen is soft.  Musculoskeletal:     Cervical back: Normal range of motion.  Lymphadenopathy:     Cervical: No cervical adenopathy.  Skin:    General: Skin is warm and dry.     Capillary Refill: Capillary refill takes less than 2 seconds.     Comments: Extensive psoriasis noted on the extensor surface of both elbows, scattered patches on her trunk, and large patches on the anterior surface of bilateral lower extremities.  Neurological:     Mental Status: She is alert and oriented to person, place, and time.  Psychiatric:        Behavior: Behavior normal.      Musculoskeletal  Exam: C-spine has limited range of motion with lateral rotation.  Thoracic and lumbar spine have good range of motion.  She has tenderness over both SI joints.  Shoulder joints have good range of motion with some discomfort in the right shoulder.  Elbow joints have good range of motion with no tenderness.  She has tenderness and painful range of motion of both wrist joints.  Tenderness and synovitis of the right fourth MCP and left third MCP.  She has tenderness of all PIP joints.  Hip joints have limited and painful range of motion.  Knee joints are painful range of motion with crepitus bilaterally.  Warmth of both knees noted.  She has tenderness and inflammation in both ankle joints.  Achilles tendinitis noted bilaterally.  CDAI Exam: CDAI Score: 21.8  Patient Global: 9 mm; Provider Global: 9  mm Swollen: 4 ; Tender: 22  Joint Exam 01/26/2020      Right  Left  Glenohumeral   Tender     Wrist   Tender   Tender  MCP 3     Swollen Tender  MCP 4  Swollen Tender     MCP 5   Tender     IP   Tender   Tender  PIP 2   Tender   Tender  PIP 3   Tender   Tender  PIP 4   Tender   Tender  PIP 5   Tender   Tender  Hip   Tender   Tender  Knee   Tender   Tender  Ankle  Swollen Tender  Swollen Tender     Investigation: No additional findings.  Imaging: No results found.  Recent Labs: Lab Results  Component Value Date   WBC 12.4 (H) 11/13/2019   HGB 13.3 11/13/2019   PLT 398 11/13/2019   NA 139 11/13/2019   K 4.3 11/13/2019   CL 105 11/13/2019   CO2 18 (L) 11/13/2019   GLUCOSE 77 11/13/2019   BUN 9 11/13/2019   CREATININE 0.73 11/13/2019   BILITOT 0.4 11/13/2019   ALKPHOS 103 11/13/2019   AST 17 11/13/2019   ALT 24 11/13/2019   PROT 6.4 11/13/2019   ALBUMIN 4.5 11/13/2019   CALCIUM 9.4 11/13/2019   GFRAA 119 11/13/2019   TB Test: October 13, 2019 at [facility] dermatology Hepatitis panel: October 13, 2019 HIV: October 13, 2019 SPEP: Pending Immunoglobulins: Pending  Labs ordered on  December 12, 2019 RF, anti-CCP, ANA, SPEP, immunoglobulins, CK, ESR  Chest x-ray: August 05, 2019 Speciality Comments: MTX- fatigue Otezla-inadequate response Cosentyx-denied by insurance  Procedures:  No procedures performed Allergies: Patient has no known allergies.   Assessment / Plan:     Visit Diagnoses: Psoriatic arthritis ([place]) - History of Achilles tendinitis, plantar fasciitis, sacroiliitis, inflammatory arthritis involving right knee, wrist joint and ankle joints: She has tenderness and synovitis of several joints as described above.  She is currently having severe pain in both wrist joints, both hands, both knee joints, and both feet.  She has ongoing Achilles tendinitis bilaterally.  She has tenderness and warmth of both knee joints and both ankle joints.  She has extensive psoriasis on the extensor surface of both elbows, scattered patches on her trunk, and extensive patches on the anterior surface of both lower extremities.  She was previously treated with methotrexate but discontinued due to side effects.  She discontinued [place] about 1.5 years ago due to insurance issues.  We discussed initiating Humira 40 mg subcutaneous injections every 14 days which has been approved through her insurance.  She will require baseline immunosuppressive labs prior to scheduling nurse visit for the administration of the first injection.  Indications, contraindications, potential side effects of Humira were reviewed.  She will follow-up in the office in 8 weeks to assess her response.- Plan: Rheumatoid factor, Cyclic citrul peptide antibody, IgG, Sedimentation rate, ANA  Counseled patient that Humira is a TNF blocking agent.  Counseled patient on purpose, proper use, and adverse effects of Humira.  Reviewed the most common adverse effects including infections, headache, and injection site reactions. Discussed that there is the possibility of an increased risk of malignancy but it is not well understood if  this increased risk is due to the medication or the disease state.  Advised patient to get yearly dermatology exams due to risk of skin cancer. Counseled  patient that Humira should be held prior to scheduled surgery.  Counseled patient to avoid live vaccines while on Humira.  Advised patient to get annual influenza vaccine and the pneumococcal vaccine as indicated.    Reviewed the importance of regular labs while on Humira therapy.  Standing orders placed.  Provided patient with medication education material and answered all questions.  Patient consented to Humira.  Will upload consent into the media tab.  Reviewed storage instructions of Humira.  Advised initial injection must be administered in office.  Patient verbalized understanding.  Dose will be for psoriatic arthritis Humira 40 mg every 14 days.  Prescription pending lab results and/or insurance approval.  Psoriasis - Patient with extensive psoriasis on the extensor surface of both elbows, scattered patches on her trunk, and large patches on the anterior surface of both lower extremities.  She is followed by Dr. April Manson.  She has been applying triamcinolone 0.1% cream topically twice daily.  She will be starting on Humira 40 mg sq injections once every 14 days.  High risk medication use - Discussed initiating Humira at the last visit.  Humira has been approved.  We will obtain the following labs today prior to scheduling a nurse visit for the administration of the first injection.  Cosentyx was not approved by her insurance.  Treated with methotrexate and Otezla in the past. Chest x-ray did not reveal any abnormality on 08/05/2019.  TB gold negative on 10/13/2019.  Hepatitis panel negative 10/13/2019 and HIV negative 10/13/2019.  Orders for SPEP and immunoglobulins released today.  - Plan: COMPLETE METABOLIC PANEL WITH GFR, CBC with Differential/Platelet, IgG, IgA, IgM, Serum protein electrophoresis with reflex  Primary osteoarthritis of both knees -  Bilateral moderate osteoarthritis and moderate chondromalacia patella.  Chronic pain.  She has painful range of motion bilaterally.  Bilateral knee crepitus noted.  Warmth but no effusion noted.  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  She is also having significant difficulty climbing steps.  Polyarthralgia -She has severe pain in multiple joints.  She has a known history of psoriatic arthritis.  The following lab work will be obtained today.  Plan: Rheumatoid factor, Cyclic citrul peptide antibody, IgG, Sedimentation rate, CK, ANA  Other fatigue -She has chronic fatigue.  We will obtain the following labs.  Plan: Sedimentation rate, CK, ANA  Other medical conditions are listed as follows:   Family history of psoriasis in sister - And her first cousin.  Intermittent hypertension  History of hyperlipidemia  OSA on CPAP  History of anxiety  History of iron deficiency anemia  BMI 40.0-44.9, adult ([place])    Orders: Orders Placed This Encounter  Procedures  . COMPLETE METABOLIC PANEL WITH GFR  . CBC with Differential/Platelet  . Rheumatoid factor  . Cyclic citrul peptide antibody, IgG  . Sedimentation rate  . CK  . ANA  . IgG, IgA, IgM  . Serum protein electrophoresis with reflex   No orders of the defined types were placed in this encounter.   Face-to-face time spent with patient was 30 minutes. Greater than 50% of time was spent in counseling and coordination of care.  Follow-Up Instructions: Return in about 8 weeks (around 03/22/2020) for Psoriatic arthritis.  Hazel Sams, PA-C  I examined and evaluated the patient with Hazel Sams PA.  Patient continues to have pain and inflammation in multiple joints.  She had synovitis on examination as described above.  She also has extensive psoriasis.  We had detailed discussion  regarding different treatment options and the side effects.  Informed consent was obtained.  We will apply for Humira.  Once  approved we will bring her in for first injection.  The plan of care was discussed as noted above.  Bo Merino, MD Note - This record has been created using Editor, commissioning.  Chart creation errors have been sought, but may not always  have been located. Such creation errors do not reflect on  the standard of medical care.

## 2020-01-26 ENCOUNTER — Ambulatory Visit (INDEPENDENT_AMBULATORY_CARE_PROVIDER_SITE_OTHER): Payer: 59 | Admitting: Rheumatology

## 2020-01-26 ENCOUNTER — Other Ambulatory Visit: Payer: Self-pay

## 2020-01-26 ENCOUNTER — Encounter: Payer: Self-pay | Admitting: Rheumatology

## 2020-01-26 VITALS — BP 154/103 | HR 71 | Resp 17 | Ht 65.0 in | Wt 252.0 lb

## 2020-01-26 DIAGNOSIS — L409 Psoriasis, unspecified: Secondary | ICD-10-CM | POA: Diagnosis not present

## 2020-01-26 DIAGNOSIS — Z840 Family history of diseases of the skin and subcutaneous tissue: Secondary | ICD-10-CM

## 2020-01-26 DIAGNOSIS — M170 Bilateral primary osteoarthritis of knee: Secondary | ICD-10-CM

## 2020-01-26 DIAGNOSIS — Z8639 Personal history of other endocrine, nutritional and metabolic disease: Secondary | ICD-10-CM

## 2020-01-26 DIAGNOSIS — R5383 Other fatigue: Secondary | ICD-10-CM

## 2020-01-26 DIAGNOSIS — Z8659 Personal history of other mental and behavioral disorders: Secondary | ICD-10-CM

## 2020-01-26 DIAGNOSIS — Z79899 Other long term (current) drug therapy: Secondary | ICD-10-CM

## 2020-01-26 DIAGNOSIS — Z6841 Body Mass Index (BMI) 40.0 and over, adult: Secondary | ICD-10-CM

## 2020-01-26 DIAGNOSIS — L4050 Arthropathic psoriasis, unspecified: Secondary | ICD-10-CM

## 2020-01-26 DIAGNOSIS — M2550 Pain in unspecified joint: Secondary | ICD-10-CM

## 2020-01-26 DIAGNOSIS — I10 Essential (primary) hypertension: Secondary | ICD-10-CM

## 2020-01-26 DIAGNOSIS — Z862 Personal history of diseases of the blood and blood-forming organs and certain disorders involving the immune mechanism: Secondary | ICD-10-CM

## 2020-01-26 DIAGNOSIS — G4733 Obstructive sleep apnea (adult) (pediatric): Secondary | ICD-10-CM

## 2020-01-26 DIAGNOSIS — Z9989 Dependence on other enabling machines and devices: Secondary | ICD-10-CM

## 2020-01-26 NOTE — Patient Instructions (Signed)
Standing Labs We placed an order today for your standing lab work.   Please have your standing labs drawn in 1 month then every 3 months   If possible, please have your labs drawn 2 weeks prior to your appointment so that the provider can discuss your results at your appointment.  We have open lab daily Monday through Thursday from 8:30-12:30 PM and 1:30-4:30 PM and Friday from 8:30-12:30 PM and 1:30-4:00 PM at the office of Dr. Bo Merino, [facility].   Please be advised, patients with office appointments requiring lab work will take precedents over walk-in lab work.  If possible, please come for your lab work on Monday and Friday afternoons, as you may experience shorter wait times. The office is located at [address] [place], [place], [place] [address] No appointment is necessary.   Labs are drawn by [facility]. Please bring your co-pay at the time of your lab draw.  You may receive a bill from [facility] for your lab work.  If you wish to have your labs drawn at another location, please call the office 24 hours in advance to send orders.  If you have any questions regarding directions or hours of operation,  please call [phone].   As a reminder, please drink plenty of water prior to coming for your lab work. Thanks!

## 2020-01-28 NOTE — Progress Notes (Signed)
AST and ALT are mildly elevated.  Please advised the patient to avoid taking NSAIDs, Tylenol, and alcohol use.  Tylenol is listed on her medication list.  Rest of CMP is within normal limits. White blood cell count is borderline elevated rest of CMP stable.  ESR within normal limits, CK within normal limits, RF and anti-CCP negative, SPEP within normal limits. ANA pending.

## 2020-01-29 ENCOUNTER — Telehealth: Payer: Self-pay | Admitting: Rheumatology

## 2020-01-29 MED ORDER — PREDNISONE 5 MG PO TABS
ORAL_TABLET | ORAL | 0 refills | Status: DC
Start: 2020-01-29 — End: 2020-02-17

## 2020-01-29 NOTE — Telephone Encounter (Signed)
Attempted to contact the patient and unable to leave a message, mailbox is full.

## 2020-01-29 NOTE — Telephone Encounter (Signed)
We recommend avoiding NSAIDs and tylenol due to elevated LFTs.  She had considerable pain and inflammation at her most recent office visit. Ok to send in a prednisone taper starting at 20 mg tapering by 5 mg every 4 days.

## 2020-01-29 NOTE — Telephone Encounter (Signed)
Patient called stating at her appointment on Monday she was told she couldn't take Ibuprofen.  Patient states she has shooting pain in the back of her ankles, as well as swelling.  Patient is requesting a return call to let her know what she can take to help relieve the pain.

## 2020-01-31 LAB — RHEUMATOID FACTOR: Rheumatoid fact SerPl-aCnc: <14 IU/mL (ref <14)

## 2020-01-31 LAB — COMPLETE METABOLIC PANEL WITH GFR
AG Ratio: 1.7 (calc) (ref 1.0–2.5)
ALT: 43 U/L — ABNORMAL HIGH (ref 6–29)
AST: 31 U/L — ABNORMAL HIGH (ref 10–30)
Albumin: 4.2 g/dL (ref 3.6–5.1)
Alkaline phosphatase (APISO): 88 U/L (ref 31–125)
BUN: 8 mg/dL (ref 7–25)
CO2: 26 mmol/L (ref 20–32)
Calcium: 9.1 mg/dL (ref 8.6–10.2)
Chloride: 107 mmol/L (ref 98–110)
Creat: 0.71 mg/dL (ref 0.50–1.10)
GFR, Est African American: 123 mL/min/{1.73_m2} (ref >=60)
GFR, Est Non African American: 107 mL/min/{1.73_m2} (ref >=60)
Globulin: 2.5 g/dL (calc) (ref 1.9–3.7)
Glucose, Bld: 75 mg/dL (ref 65–99)
Potassium: 4.4 mmol/L (ref 3.5–5.3)
Sodium: 137 mmol/L (ref 135–146)
Total Bilirubin: 0.4 mg/dL (ref 0.2–1.2)
Total Protein: 6.7 g/dL (ref 6.1–8.1)

## 2020-01-31 LAB — CK: Total CK: 63 U/L (ref 29–143)

## 2020-01-31 LAB — CBC WITH DIFFERENTIAL/PLATELET
Absolute Monocytes: 1007 cells/uL — ABNORMAL HIGH (ref 200–950)
Basophils Absolute: 127 cells/uL (ref 0–200)
Basophils Relative: 1.2 %
Eosinophils Absolute: 403 cells/uL (ref 15–500)
Eosinophils Relative: 3.8 %
HCT: 43.2 % (ref 35.0–45.0)
Hemoglobin: 14.2 g/dL (ref 11.7–15.5)
Lymphs Abs: 3063 cells/uL (ref 850–3900)
MCH: 27.7 pg (ref 27.0–33.0)
MCHC: 32.9 g/dL (ref 32.0–36.0)
MCV: 84.4 fL (ref 80.0–100.0)
MPV: 9.7 fL (ref 7.5–12.5)
Monocytes Relative: 9.5 %
Neutro Abs: 6000 cells/uL (ref 1500–7800)
Neutrophils Relative %: 56.6 %
Platelets: 348 10*3/uL (ref 140–400)
RBC: 5.12 10*6/uL — ABNORMAL HIGH (ref 3.80–5.10)
RDW: 16.3 % — ABNORMAL HIGH (ref 11.0–15.0)
Total Lymphocyte: 28.9 %
WBC: 10.6 10*3/uL (ref 3.8–10.8)

## 2020-01-31 LAB — IGG, IGA, IGM
IgG (Immunoglobin G), Serum: 1168 mg/dL (ref 600–1640)
IgM, Serum: 85 mg/dL (ref 50–300)
Immunoglobulin A: 176 mg/dL (ref 47–310)

## 2020-01-31 LAB — PROTEIN ELECTROPHORESIS, SERUM, WITH REFLEX
Albumin ELP: 3.8 g/dL (ref 3.8–4.8)
Alpha 1: 0.3 g/dL (ref 0.2–0.3)
Alpha 2: 0.7 g/dL (ref 0.5–0.9)
Beta 2: 0.4 g/dL (ref 0.2–0.5)
Beta Globulin: 0.5 g/dL (ref 0.4–0.6)
Gamma Globulin: 1.1 g/dL (ref 0.8–1.7)
Total Protein: 6.8 g/dL (ref 6.1–8.1)

## 2020-01-31 LAB — CYCLIC CITRUL PEPTIDE ANTIBODY, IGG: Cyclic Citrullin Peptide Ab: <16 UNITS

## 2020-01-31 LAB — ANA: Anti Nuclear Antibody (ANA): NEGATIVE

## 2020-01-31 LAB — SEDIMENTATION RATE: Sed Rate: 2 mm/h (ref 0–20)

## 2020-02-02 NOTE — Progress Notes (Signed)
ANA is negative.

## 2020-02-15 ENCOUNTER — Other Ambulatory Visit: Payer: Self-pay | Admitting: Emergency Medicine

## 2020-02-15 DIAGNOSIS — F411 Generalized anxiety disorder: Secondary | ICD-10-CM

## 2020-02-15 NOTE — Telephone Encounter (Signed)
Requested medication (s) are due for refill today: yes  Requested medication (s) are on the active medication list: expired  Last refill:  03/13/19  Future visit scheduled: yes  Notes to clinic:  prescription expired 01/26/20   Requested Prescriptions  Pending Prescriptions Disp Refills   escitalopram (LEXAPRO) 20 MG tablet [Pharmacy Med Name: ESCITALOPRAM 20MG TABLETS] 90 tablet 3    Sig: TAKE 1 TABLET(20 MG) BY MOUTH DAILY      Psychiatry:  Antidepressants - SSRI Passed - 02/15/2020  3:52 PM      Passed - Valid encounter within last 6 months    Recent Outpatient Visits           3 months ago Psoriasis   Primary Care at [facility], Ines Bloomer, MD   4 months ago Psoriasis   Primary Care at [facility], Ines Bloomer, MD   5 months ago Psoriatic arthritis [facility])   Primary Care at [facility], [place], MD   6 months ago Anemia due to chronic blood loss   Primary Care at [facility], Ines Bloomer, MD   6 months ago Dysfunctional uterine bleeding   Primary Care at [facility], Ines Bloomer, MD       Future Appointments             In 1 month [place], Ines Bloomer, MD Primary Care at [facility], [place]   In 1 month Bo Merino, [facility]

## 2020-02-16 ENCOUNTER — Other Ambulatory Visit: Payer: Self-pay | Admitting: Rheumatology

## 2020-02-16 NOTE — Telephone Encounter (Signed)
Patient has bone spurs, and having a lot of pain today. Hard to walk due to pain. Patient also having a lot of swelling all over. Prednisone did not help. Patient can hardly move. Patient was advised to not take Advil. Is there something else she can take? Patient uses [facility] on [address]

## 2020-02-16 NOTE — Telephone Encounter (Signed)
Patient states she has an appointment on 03/22/2020 to start Humira. Patient states she has completed the prednisone taper. Patient states not even the 20 mg of Prednisone was helpful. Patient states she just "suffered through it".

## 2020-02-16 NOTE — Telephone Encounter (Signed)
Please check on the status of starting on Humira.   Has she completed the prednisone taper? Please clarify what dose of prednisone has helped in the past?

## 2020-02-16 NOTE — Telephone Encounter (Signed)
Please see today's note below:  Last office visit 01/26/2020  Note from 01/29/2020: We recommend avoiding NSAIDs and tylenol due to elevated LFTs.  She had considerable pain and inflammation at her most recent office visit. Ok to send in a prednisone taper starting at 20 mg tapering by 5 mg every 4 days.

## 2020-02-17 ENCOUNTER — Telehealth: Payer: Self-pay | Admitting: Rheumatology

## 2020-02-17 ENCOUNTER — Other Ambulatory Visit: Payer: Self-pay | Admitting: Physician Assistant

## 2020-02-17 MED ORDER — PREDNISONE 5 MG PO TABS
ORAL_TABLET | ORAL | 0 refills | Status: DC
Start: 2020-02-17 — End: 2020-03-18

## 2020-02-17 NOTE — Telephone Encounter (Signed)
Patient advised prescription for Prednisone will be sent to the pharmacy and to contact the office if she has any further concerns.

## 2020-02-17 NOTE — Telephone Encounter (Signed)
Patient contacted the office this morning stating that her pain is getting worse. Patient states she is having trouble walking. Patient states she is having pain in her shins as well now. Please advise.

## 2020-02-17 NOTE — Telephone Encounter (Signed)
Patient called checking the status of her prescription of Prednisone and making sure it will be sent to the pharmacy today.

## 2020-02-17 NOTE — Telephone Encounter (Signed)
Patient has not started Humira yet.  She is flaring.  Please start her on prednisone 20 mg p.o. daily and taper by 5 mg every week.

## 2020-02-17 NOTE — Telephone Encounter (Signed)
Patient advised prescription has been sent to the pharmacy.  

## 2020-03-08 NOTE — Progress Notes (Deleted)
Office Visit Note  Patient: Theresa Sawyer             Date of Birth: [birth year]           MRN: [id number]             PCP: Horald Pollen, MD Referring: Horald Pollen, * Visit Date: 03/22/2020 Occupation: @GUAROCC @  Subjective:  No chief complaint on file.   History of Present Illness: Theresa Sawyer is a 39 y.o. female ***   Activities of Daily Living:  Patient reports morning stiffness for *** {minute/hour:19697}.   Patient {ACTIONS;DENIES/REPORTS:21021675::"Denies"} nocturnal pain.  Difficulty dressing/grooming: {ACTIONS;DENIES/REPORTS:21021675::"Denies"} Difficulty climbing stairs: {ACTIONS;DENIES/REPORTS:21021675::"Denies"} Difficulty getting out of chair: {ACTIONS;DENIES/REPORTS:21021675::"Denies"} Difficulty using hands for taps, buttons, cutlery, and/or writing: {ACTIONS;DENIES/REPORTS:21021675::"Denies"}  No Rheumatology ROS completed.   PMFS History:  Patient Active Problem List   Diagnosis Date Noted  . Psoriatic arthritis ([place]) 01/24/2020  . Primary osteoarthritis of both knees 01/24/2020  . History of hyperlipidemia 01/24/2020  . History of iron deficiency anemia 01/24/2020  . BMI 40.0-44.9, adult ([place]) 01/24/2020  . OSA on CPAP 04/24/2019  . Psoriasis 12/05/2018  . Situational anxiety 12/05/2018  . Intermittent hypertension 12/05/2018    Past Medical History:  Diagnosis Date  . Abnormal uterine bleeding (AUB)   . Anxiety   . GERD (gastroesophageal reflux disease)   . History of chest pain (10-20-2019  pt denies any cardiac s&s)   mutliple ED visits, atypical chest pain, chest wall pain, muscularskeletal chest pain;  pt had cardiology evaulation dated 04-23-2018 note in epic , state atypical chest wall pain with negative d dimer and troponin's multiple times in epic, suggested CTA  (pt did not get done)  . Hyperlipidemia   . IDA (iron deficiency anemia)   . OSA on CPAP    followed by dr Dia Sitter  . Psoriasis   . Psoriatic arthritis ([place])       Family History  Problem Relation Age of Onset  . Diabetes Maternal Grandmother   . Diabetes Paternal Grandmother   . Anxiety disorder Mother   . Psoriasis Sister   . Heart attack Brother 34       deceased from this  . Anxiety disorder Brother   . Cirrhosis Maternal Grandfather        alcohol related  . Allergy (severe) Son   . Healthy Son   . Colon cancer Neg Hx   . Esophageal cancer Neg Hx   . Colon polyps Neg Hx   . Stomach cancer Neg Hx   . Pancreatic cancer Neg Hx    Past Surgical History:  Procedure Laterality Date  . DILATATION & CURETTAGE/HYSTEROSCOPY WITH MYOSURE N/A 10/24/2019   Procedure: DILATATION & CURETTAGE/HYSTEROSCOPY WITH MYOSURE;  Surgeon: Joseph Pierini, MD;  Location: [facility];  Service: Gynecology;  Laterality: N/A;  . TUBAL LIGATION Bilateral 02/05/2013   Procedure: POST PARTUM TUBAL LIGATION;  Surgeon: Woodroe Mode, MD;  Location: [facility];  Service: Gynecology;  Laterality: Bilateral;    [place] clips   Social History   Social History Narrative  . Not on file   Immunization History  Administered Date(s) Administered  . Rho (D) Immune Globulin 07/10/2012, 12/30/2012, 02/05/2013  . Tdap 02/06/2013     Objective: Vital Signs: There were no vitals taken for this visit.   Physical Exam   Musculoskeletal Exam: ***  CDAI Exam: CDAI Score: -- Patient Global: --; Provider Global: -- Swollen: --; Tender: -- Joint Exam 03/22/2020  No joint exam has been documented for this visit   There is currently no information documented on the homunculus. Go to the Rheumatology activity and complete the homunculus joint exam.  Investigation: No additional findings.  Imaging: No results found.  Recent Labs: Lab Results  Component Value Date   WBC 10.6 01/26/2020   HGB 14.2 01/26/2020   PLT 348 01/26/2020   NA 137 01/26/2020   K 4.4 01/26/2020   CL 107 01/26/2020   CO2 26 01/26/2020   GLUCOSE 75 01/26/2020   BUN 8  01/26/2020   CREATININE 0.71 01/26/2020   BILITOT 0.4 01/26/2020   ALKPHOS 103 11/13/2019   AST 31 (H) 01/26/2020   ALT 43 (H) 01/26/2020   PROT 6.7 01/26/2020   PROT 6.8 01/26/2020   ALBUMIN 4.5 11/13/2019   CALCIUM 9.1 01/26/2020   GFRAA 123 01/26/2020    Speciality Comments: MTX- fatigue Otezla-inadequate response Cosentyx-denied by insurance  Procedures:  No procedures performed Allergies: Patient has no known allergies.   Assessment / Plan:     Visit Diagnoses: No diagnosis found.  Orders: No orders of the defined types were placed in this encounter.  No orders of the defined types were placed in this encounter.   Face-to-face time spent with patient was *** minutes. Greater than 50% of time was spent in counseling and coordination of care.  Follow-Up Instructions: No follow-ups on file.   Earnestine Mealing, CMA  Note - This record has been created using Editor, commissioning.  Chart creation errors have been sought, but may not always  have been located. Such creation errors do not reflect on  the standard of medical care.

## 2020-03-12 ENCOUNTER — Ambulatory Visit: Payer: 59 | Admitting: Obstetrics and Gynecology

## 2020-03-12 ENCOUNTER — Encounter: Payer: Self-pay | Admitting: Obstetrics and Gynecology

## 2020-03-12 ENCOUNTER — Other Ambulatory Visit: Payer: Self-pay

## 2020-03-12 VITALS — BP 124/80

## 2020-03-12 DIAGNOSIS — R102 Pelvic and perineal pain: Secondary | ICD-10-CM | POA: Diagnosis not present

## 2020-03-12 DIAGNOSIS — Z712 Person consulting for explanation of examination or test findings: Secondary | ICD-10-CM

## 2020-03-12 DIAGNOSIS — N939 Abnormal uterine and vaginal bleeding, unspecified: Secondary | ICD-10-CM | POA: Diagnosis not present

## 2020-03-12 DIAGNOSIS — N938 Other specified abnormal uterine and vaginal bleeding: Secondary | ICD-10-CM

## 2020-03-12 NOTE — Progress Notes (Signed)
Theresa Sawyer 1980-09-11 [id number]  SUBJECTIVE:  39 y.o. 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  She was planning for an endometrial ablation and diagnostic laparoscopy to evaluate for endometriosis given her chronic pelvic pain.  She had to cancel and plan on rescheduling for future due to being short staffed at her workplace.  She did already have a hysteroscopy D&C back in April 2021 with findings of benign tissue.  She does continue to have fairly continuous bleeding most days, this is controlled by taking Megace 40 mg twice daily which she has started back up on again.  She says her rheumatologist had talked to her about going on Humira for control of her arthritis.   Current Outpatient Medications  Medication Sig Dispense Refill  . ALPRAZolam (XANAX) 0.5 MG tablet TAKE 1 TABLET BY MOUTH ONCE DAILY AS NEEDED FOR ANXIETY 30 tablet 0  . escitalopram (LEXAPRO) 20 MG tablet TAKE 1 TABLET(20 MG) BY MOUTH DAILY 90 tablet 3  . famotidine (PEPCID) 40 MG tablet TAKE 1 TABLET(40 MG) BY MOUTH TWICE DAILY 60 tablet 3  . megestrol (MEGACE) 40 MG tablet Take 40 mg by mouth 2 (two) times daily.    . Multiple Vitamins-Minerals (MULTIVITAMIN WITH MINERALS) tablet Take 1 tablet by mouth daily.    Marland Kitchen triamcinolone cream (KENALOG) 0.1 % Apply 1 application topically 2 (two) times daily.    Marland Kitchen acetaminophen (TYLENOL) 500 MG tablet Take 2 tablets (1,000 mg total) by mouth every 6 (six) hours. Take for 5 days, then as needed (Patient not taking: Reported on 03/12/2020)  2  . Aspirin-Acetaminophen-Caffeine (GOODYS EXTRA STRENGTH PO) Take 1 packet by mouth as needed.  (Patient not taking: Reported on 03/12/2020)    . Ferrous Sulfate (IRON PO) Take 2 tablets by mouth every evening.  (Patient not taking: Reported on 03/12/2020)    . ibuprofen (ADVIL) 800 MG tablet Take 1 tablet (800 mg total) by mouth 3 (three) times daily.  (Patient not taking: Reported on 03/12/2020) 30 tablet 0  . predniSONE (DELTASONE) 5 MG tablet Take 4 tabs po x 7 days, 3  tabs po x 7 days, 2  tabs po x 7 days, 1  tab po x 7 days (Patient not taking: Reported on 03/12/2020) 70 tablet 0   No current facility-administered medications for this visit.   Allergies: Patient has no known allergies.  No LMP recorded. (Menstrual status: Irregular Periods).  Past medical history,surgical history, problem list, medications, allergies, family history and social history were all reviewed and documented as reviewed in the EPIC chart.   OBJECTIVE:  BP 124/80  The patient appears well, alert, oriented, in no distress. Back: Tender over right SI joint PELVIC EXAM: Deferred due to consultative nature of visit   ASSESSMENT:  39 y.o. [id number] here for discussion of abnormal uterine bleeding, lower abdominal/pelvic pain, recent findings and pelvic x-ray  PLAN:  1.  Pelvic x-ray findings reviewed and there is note of bilateral SI joint sclerosis.  We discussed that this would not have any gynecologic manifestations and that her pelvic/abdominal pain and vaginal bleeding.  She will continue following with her rheumatologist.  I did ask her to see if she could delay starting Humira until after her upcoming procedure whenever she gets that scheduled. 2.  Abdominal/pelvic pain and abnormal uterine bleeding.  We had planned for diagnostic laparoscopy and D&C with endometrial ablation.  She has had  to postpone that but will call back when she is ready to schedule.  Indicates she has a supply of Megace 40 mg twice daily which she will continue to take for control of AUB.  We again briefly reviewed the plan for the procedure including operative risks and expected recovery.  I again offered her to trial Mirena IUD prior to ablation explaining that post ablation pain syndrome is possible especially since she has had a previous tubal ligation, and if ablation fails usually we  need to move forward with hysterectomy at some point.  She understands that and still wants to proceed with ablation.  All questions were answered by the end of the visit.   Joseph Pierini MD 03/12/20

## 2020-03-18 ENCOUNTER — Telehealth: Payer: Self-pay | Admitting: *Deleted

## 2020-03-18 ENCOUNTER — Other Ambulatory Visit: Payer: Self-pay

## 2020-03-18 ENCOUNTER — Ambulatory Visit: Payer: 59 | Admitting: Emergency Medicine

## 2020-03-18 ENCOUNTER — Encounter: Payer: Self-pay | Admitting: Emergency Medicine

## 2020-03-18 ENCOUNTER — Telehealth: Payer: Self-pay | Admitting: Rheumatology

## 2020-03-18 VITALS — BP 151/90 | HR 76 | Temp 98.3°F | Resp 16 | Ht 65.0 in | Wt 249.0 lb

## 2020-03-18 DIAGNOSIS — G4733 Obstructive sleep apnea (adult) (pediatric): Secondary | ICD-10-CM

## 2020-03-18 DIAGNOSIS — Z6841 Body Mass Index (BMI) 40.0 and over, adult: Secondary | ICD-10-CM | POA: Diagnosis not present

## 2020-03-18 DIAGNOSIS — F418 Other specified anxiety disorders: Secondary | ICD-10-CM | POA: Diagnosis not present

## 2020-03-18 DIAGNOSIS — N938 Other specified abnormal uterine and vaginal bleeding: Secondary | ICD-10-CM | POA: Insufficient documentation

## 2020-03-18 DIAGNOSIS — M159 Polyosteoarthritis, unspecified: Secondary | ICD-10-CM

## 2020-03-18 DIAGNOSIS — M8949 Other hypertrophic osteoarthropathy, multiple sites: Secondary | ICD-10-CM | POA: Diagnosis not present

## 2020-03-18 DIAGNOSIS — Z9989 Dependence on other enabling machines and devices: Secondary | ICD-10-CM

## 2020-03-18 DIAGNOSIS — L409 Psoriasis, unspecified: Secondary | ICD-10-CM

## 2020-03-18 DIAGNOSIS — F411 Generalized anxiety disorder: Secondary | ICD-10-CM | POA: Insufficient documentation

## 2020-03-18 DIAGNOSIS — D500 Iron deficiency anemia secondary to blood loss (chronic): Secondary | ICD-10-CM

## 2020-03-18 MED ORDER — ALPRAZOLAM 0.5 MG PO TABS: ORAL_TABLET | ORAL | 1 refills | Status: DC

## 2020-03-18 NOTE — Telephone Encounter (Signed)
Tramadol 50-100 mg q 6 hr prn severe pain #12

## 2020-03-18 NOTE — Telephone Encounter (Signed)
Patient advised due to her elevation in LFTs we do not recommend the use of Tylenol or NSAIDs.  If her gynecologist plans on following lab work closely she can try taking NSAIDs as needed.

## 2020-03-18 NOTE — Telephone Encounter (Signed)
I would recommend heat and ice packs, she should check with her rheumatologist if she can do diclofenac or mefenamic acid Only other options beyond that for pain control are narcotic pain relievers

## 2020-03-18 NOTE — Telephone Encounter (Signed)
Patient called c/o severe cramping due to bleeding, has megace 40 mg to help control the bleeding,however reports the cramps are bad. She asked if you would be willing to prescribe medication to help bring some relief. Patient is not able take ibuprofen or Midol per her rheumatologist. She has tried tylenol and no relief, patient asked if you have any other recommendations?

## 2020-03-18 NOTE — Progress Notes (Signed)
Theresa Sawyer 39 y.o.   Chief Complaint  Patient presents with   chronic medical conditions    follow up 6 month after seeing Rheumatology    HISTORY OF PRESENT ILLNESS: This is a 39 y.o. female here for follow-up of chronic medical conditions. Recently saw her rheumatologist who wants to start patient on Humira. 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 Also has chronic pelvic problems.  Scheduled for gynecological procedures next week including D&C and endometrial ablation. Has history of chronic depression and anxiety, stable on medication. History of sleep apnea on CPAP. History of iron deficiency anemia.  Stable. Was also seen by dermatologist for her psoriasis.  Getting treatment.  Doing better. No other complaints or medical concerns today.  HPI   Prior to Admission medications   Medication Sig Start Date End Date Taking? Authorizing Provider  ALPRAZolam Duanne Moron) 0.5 MG tablet TAKE 1 TABLET BY MOUTH ONCE DAILY AS NEEDED FOR ANXIETY 12/25/19  Yes Rutherford Guys, MD  escitalopram (LEXAPRO) 20 MG tablet TAKE 1 TABLET(20 MG) BY MOUTH DAILY 02/16/20  Yes Kaelynne Christley, Ines Bloomer, MD  famotidine (PEPCID) 40 MG tablet TAKE 1 TABLET(40 MG) BY MOUTH TWICE DAILY 12/23/19  Yes Thornton Park, MD  Ferrous Sulfate (IRON PO) Take 2 tablets by mouth every evening.    Yes [provider]  megestrol (MEGACE) 40 MG tablet Take 40 mg by mouth 2 (two) times daily.   Yes [provider]  Multiple Vitamins-Minerals (MULTIVITAMIN WITH MINERALS) tablet Take 1 tablet by mouth daily.   Yes [provider]  triamcinolone cream (KENALOG) 0.1 % Apply 1 application topically 2 (two) times daily.   Yes [provider]  acetaminophen (TYLENOL) 500 MG tablet Take 2 tablets (1,000 mg total) by mouth every 6 (six) hours. Take for 5 days, then as needed Patient not taking: Reported on 03/18/2020 10/24/19 10/23/20  Joseph Pierini, MD    Aspirin-Acetaminophen-Caffeine (GOODYS EXTRA STRENGTH PO) Take 1 packet by mouth as needed.  Patient not taking: Reported on 03/18/2020    [provider]  ibuprofen (ADVIL) 800 MG tablet Take 1 tablet (800 mg total) by mouth 3 (three) times daily. Patient not taking: Reported on 03/18/2020 11/02/19   Emerson Monte, FNP  predniSONE (DELTASONE) 5 MG tablet Take 4 tabs po x 7 days, 3  tabs po x 7 days, 2  tabs po x 7 days, 1  tab po x 7 days Patient not taking: Reported on 03/18/2020 02/17/20   Bo Merino, MD    No Known Allergies  Patient Active Problem List   Diagnosis Date Noted   Psoriatic arthritis ([place]) 01/24/2020   Primary osteoarthritis of both knees 01/24/2020   History of hyperlipidemia 01/24/2020   History of iron deficiency anemia 01/24/2020   BMI 40.0-44.9, adult ([place]) 01/24/2020   OSA on CPAP 04/24/2019   Psoriasis 12/05/2018   Situational anxiety 12/05/2018   Intermittent hypertension 12/05/2018    Past Medical History:  Diagnosis Date   Abnormal uterine bleeding (AUB)    Anxiety    GERD (gastroesophageal reflux disease)    History of chest pain (10-20-2019  pt denies any cardiac s&s)   mutliple ED visits, atypical chest pain, chest wall pain, muscularskeletal chest pain;  pt had cardiology evaulation dated 04-23-2018 note in epic , state atypical chest wall pain with negative d dimer and troponin's multiple times in epic, suggested CTA  (pt did not get done)   Hyperlipidemia    IDA (  iron deficiency anemia)    OSA on CPAP    followed by dr Dia Sitter   Psoriasis    Psoriatic arthritis [facility])     Past Surgical History:  Procedure Laterality Date   DILATATION & CURETTAGE/HYSTEROSCOPY WITH MYOSURE N/A 10/24/2019   Procedure: DILATATION & CURETTAGE/HYSTEROSCOPY WITH MYOSURE;  Surgeon: Joseph Pierini, MD;  Location: [facility];  Service: Gynecology;  Laterality: N/A;   TUBAL LIGATION Bilateral 02/05/2013   Procedure: POST  PARTUM TUBAL LIGATION;  Surgeon: Woodroe Mode, MD;  Location: [facility];  Service: Gynecology;  Laterality: Bilateral;    Filshie clips    Social History   Socioeconomic History   Marital status: Legally Separated    Spouse name: Not on file   Number of children: Not on file   Years of education: Not on file   Highest education level: Not on file  Occupational History   Not on file  Tobacco Use   Smoking status: Current Some Day Smoker    Packs/day: 0.50    Years: 20.00    Pack years: 10.00    Types: Cigarettes   Smokeless tobacco: Never Used  Vaping Use   Vaping Use: Never used  Substance and Sexual Activity   Alcohol use: No   Drug use: Never   Sexual activity: Yes    Birth control/protection: Surgical  Other Topics Concern   Not on file  Social History Narrative   Not on file   Social Determinants of Health   Financial Resource Strain:    Difficulty of Paying Living Expenses: Not on file  Food Insecurity:    Worried About Running Out of Food in the Last Year: Not on file   [employer] of Food in the Last Year: Not on file  Transportation Needs:    Lack of Transportation (Medical): Not on file   Lack of Transportation (Non-Medical): Not on file  Physical Activity:    Days of Exercise per Week: Not on file   Minutes of Exercise per Session: Not on file  Stress:    Feeling of Stress : Not on file  Social Connections:    Frequency of Communication with Friends and Family: Not on file   Frequency of Social Gatherings with Friends and Family: Not on file   Attends Religious Services: Not on file   Active Member of Clubs or Organizations: Not on file   Attends Archivist Meetings: Not on file   Marital Status: Not on file  Intimate Partner Violence:    Fear of Current or Ex-Partner: Not on file   Emotionally Abused: Not on file   Physically Abused: Not on file   Sexually Abused: Not on file    Family History  Problem  Relation Age of Onset   Diabetes Maternal Grandmother    Diabetes Paternal Grandmother    Anxiety disorder Mother    Psoriasis Sister    Heart attack Brother 34       deceased from this   Anxiety disorder Brother    Cirrhosis Maternal Grandfather        alcohol related   Allergy (severe) Son    Healthy Son    Colon cancer Neg Hx    Esophageal cancer Neg Hx    Colon polyps Neg Hx    Stomach cancer Neg Hx    Pancreatic cancer Neg Hx      Review of Systems  Constitutional: Negative.  Negative for chills and fever.  HENT: Negative.  Negative for congestion and sore throat.   Respiratory: Negative.  Negative for cough and shortness of breath.   Cardiovascular: Negative.  Negative for chest pain and palpitations.  Gastrointestinal: Negative.  Negative for abdominal pain, diarrhea, nausea and vomiting.  Genitourinary: Negative.  Negative for dysuria and hematuria.  Musculoskeletal: Positive for back pain and joint pain.  Skin: Positive for rash.  Neurological: Negative for dizziness and headaches.  All other systems reviewed and are negative.  Today's Vitals   03/18/20 0851  BP: (!) 151/90  Pulse: 76  Resp: 16  Temp: 98.3 F (36.8 C)  TempSrc: Temporal  SpO2: 96%  Weight: 249 lb (112.9 kg)  Height: 5' 5"  (1.651 m)   Body mass index is 41.44 kg/m.   Physical Exam Vitals reviewed.  Constitutional:      Appearance: She is obese.  HENT:     Head: Normocephalic.  Eyes:     Extraocular Movements: Extraocular movements intact.     Pupils: Pupils are equal, round, and reactive to light.  Cardiovascular:     Rate and Rhythm: Normal rate and regular rhythm.     Pulses: Normal pulses.     Heart sounds: Normal heart sounds.  Pulmonary:     Effort: Pulmonary effort is normal.     Breath sounds: Normal breath sounds.  Abdominal:     Palpations: Abdomen is soft.     Tenderness: There is no abdominal tenderness.  Musculoskeletal:        General: Normal range  of motion.     Cervical back: Normal range of motion and neck supple. No tenderness.  Lymphadenopathy:     Cervical: No cervical adenopathy.  Skin:    Findings: Rash (Typical psoriatic patches on elbows and knees) present.  Neurological:     General: No focal deficit present.     Mental Status: She is alert and oriented to person, place, and time.  Psychiatric:        Mood and Affect: Mood normal.        Behavior: Behavior normal.    A total of 30 minutes was spent with the patient, greater than 50% of which was in counseling/coordination of care regarding multiple chronic medical conditions and recent specialists office visits, review of all medications, review of most recent blood work results, review of most recent office visit notes, diet and nutrition, prognosis and need for follow-up.   ASSESSMENT & PLAN: Clinically stable.  No medical concerns identified during this visit.  Continue present medications.  No changes. Follow-up in 6 months. Travia was seen today for chronic medical conditions.  Diagnoses and all orders for this visit:  Primary osteoarthritis involving multiple joints  Situational anxiety -     ALPRAZolam (XANAX) 0.5 MG tablet; TAKE 1 TABLET BY MOUTH ONCE DAILY AS NEEDED FOR ANXIETY  Body mass index (BMI) of 40.1-44.9 in adult ([facility])  Psoriasis  OSA on CPAP  Generalized anxiety disorder  Dysfunctional uterine bleeding  Anemia due to chronic blood loss    Patient Instructions       If you have lab work done today you will be contacted with your lab results within the next 2 weeks.  If you have not heard from [place] then please contact us. The fastest way to get your results is to register for My Chart.   IF you received an x-ray today, you will receive an invoice from [facility]. Please contact [facility] at [phone] with questions or concerns regarding your invoice.  IF you received labwork today, you will receive an invoice  from [facility]. Please contact [facility] at [phone] with questions or concerns regarding your invoice.   Our billing staff will not be able to assist you with questions regarding bills from these companies.  You will be contacted with the lab results as soon as they are available. The fastest way to get your results is to activate your My Chart account. Instructions are located on the last page of this paperwork. If you have not heard from [place] regarding the results in 2 weeks, please contact this office.     Health Maintenance, Female Adopting a healthy lifestyle and getting preventive care are important in promoting health and wellness. Ask your health care provider about:  The right schedule for you to have regular tests and exams.  Things you can do on your own to prevent diseases and keep yourself healthy. What should I know about diet, weight, and exercise? Eat a healthy diet   Eat a diet that includes plenty of vegetables, fruits, low-fat dairy products, and lean protein.  Do not eat a lot of foods that are high in solid fats, added sugars, or sodium. Maintain a healthy weight Body mass index (BMI) is used to identify weight problems. It estimates body fat based on height and weight. Your health care provider can help determine your BMI and help you achieve or maintain a healthy weight. Get regular exercise Get regular exercise. This is one of the most important things you can do for your health. Most adults should:  Exercise for at least 150 minutes each week. The exercise should increase your heart rate and make you sweat (moderate-intensity exercise).  Do strengthening exercises at least twice a week. This is in addition to the moderate-intensity exercise.  Spend less time sitting. Even light physical activity can be beneficial. Watch cholesterol and blood lipids Have your blood tested for lipids and cholesterol at 41 years of age, then have this test every 5 years. Have  your cholesterol levels checked more often if:  Your lipid or cholesterol levels are high.  You are older than 41 years of age.  You are at high risk for heart disease. What should I know about cancer screening? Depending on your health history and family history, you may need to have cancer screening at various ages. This may include screening for:  Breast cancer.  Cervical cancer.  Colorectal cancer.  Skin cancer.  Lung cancer. What should I know about heart disease, diabetes, and high blood pressure? Blood pressure and heart disease  High blood pressure causes heart disease and increases the risk of stroke. This is more likely to develop in people who have high blood pressure readings, are of African descent, or are overweight.  Have your blood pressure checked: ? Every 3-5 years if you are 22-51 years of age. ? Every year if you are 28 years old or older. Diabetes Have regular diabetes screenings. This checks your fasting blood sugar level. Have the screening done:  Once every three years after age 36 if you are at a normal weight and have a low risk for diabetes.  More often and at a younger age if you are overweight or have a high risk for diabetes. What should I know about preventing infection? Hepatitis B If you have a higher risk for hepatitis B, you should be screened for this virus. Talk with your health care provider to find out if you are at risk for hepatitis  B infection. Hepatitis C Testing is recommended for:  Everyone born from [birth year] through 1967.  Anyone with known risk factors for hepatitis C. Sexually transmitted infections (STIs)  Get screened for STIs, including gonorrhea and chlamydia, if: ? You are sexually active and are younger than 41 years of age. ? You are older than 40 years of age and your health care provider tells you that you are at risk for this type of infection. ? Your sexual activity has changed since you were last screened, and you  are at increased risk for chlamydia or gonorrhea. Ask your health care provider if you are at risk.  Ask your health care provider about whether you are at high risk for HIV. Your health care provider may recommend a prescription medicine to help prevent HIV infection. If you choose to take medicine to prevent HIV, you should first get tested for HIV. You should then be tested every 3 months for as long as you are taking the medicine. Pregnancy  If you are about to stop having your period (premenopausal) and you may become pregnant, seek counseling before you get pregnant.  Take 400 to 800 micrograms (mcg) of folic acid every day if you become pregnant.  Ask for birth control (contraception) if you want to prevent pregnancy. Osteoporosis and menopause Osteoporosis is a disease in which the bones lose minerals and strength with aging. This can result in bone fractures. If you are 45 years old or older, or if you are at risk for osteoporosis and fractures, ask your health care provider if you should:  Be screened for bone loss.  Take a calcium or vitamin D supplement to lower your risk of fractures.  Be given hormone replacement therapy (HRT) to treat symptoms of menopause. Follow these instructions at home: Lifestyle  Do not use any products that contain nicotine or tobacco, such as cigarettes, e-cigarettes, and chewing tobacco. If you need help quitting, ask your health care provider.  Do not use street drugs.  Do not share needles.  Ask your health care provider for help if you need support or information about quitting drugs. Alcohol use  Do not drink alcohol if: ? Your health care provider tells you not to drink. ? You are pregnant, may be pregnant, or are planning to become pregnant.  If you drink alcohol: ? Limit how much you use to 0-1 drink a day. ? Limit intake if you are breastfeeding.  Be aware of how much alcohol is in your drink. In the [place] one drink equals one 12  oz bottle of beer (355 mL), one 5 oz glass of wine (148 mL), or one 1 oz glass of hard liquor (44 mL). General instructions  Schedule regular health, dental, and eye exams.  Stay current with your vaccines.  Tell your health care provider if: ? You often feel depressed. ? You have ever been abused or do not feel safe at home. Summary  Adopting a healthy lifestyle and getting preventive care are important in promoting health and wellness.  Follow your health care provider's instructions about healthy diet, exercising, and getting tested or screened for diseases.  Follow your health care provider's instructions on monitoring your cholesterol and blood pressure. This information is not intended to replace advice given to you by your health care provider. Make sure you discuss any questions you have with your health care provider. Document Revised: 06/19/2018 Document Reviewed: 06/19/2018 Elsevier Patient Education  2020 [employer].  Agustina Caroli, MD [facility]

## 2020-03-18 NOTE — Telephone Encounter (Signed)
Patient having a lot of cramping, female issues. GYN advised her to take an NSAID. Patient wanted to check with you to is if she can take that, or not. Please call to advise.

## 2020-03-18 NOTE — Telephone Encounter (Signed)
Patient will call me back once speaking with her rheumatologist.

## 2020-03-18 NOTE — Telephone Encounter (Signed)
Patient said she did call and leave a message for the rheumatologist but couldn't get anyone on the phone she had to leave a message. She is willing to try anything to ease the cramping, reports the cramping makes it hard to get up and walk at times. Reports she does not take a lot of medication and will only use the medication to help for severe pain. She is willing to try a low dose narcotic.

## 2020-03-18 NOTE — Patient Instructions (Addendum)
If you have lab work done today you will be contacted with your lab results within the next 2 weeks.  If you have not heard from [place] then please contact us. The fastest way to get your results is to register for My Chart.   IF you received an x-ray today, you will receive an invoice from [facility]. Please contact [facility] at [phone] with questions or concerns regarding your invoice.   IF you received labwork today, you will receive an invoice from [facility]. Please contact [facility] at [phone] with questions or concerns regarding your invoice.   Our billing staff will not be able to assist you with questions regarding bills from these companies.  You will be contacted with the lab results as soon as they are available. The fastest way to get your results is to activate your My Chart account. Instructions are located on the last page of this paperwork. If you have not heard from [place] regarding the results in 2 weeks, please contact this office.     Health Maintenance, Female Adopting a healthy lifestyle and getting preventive care are important in promoting health and wellness. Ask your health care provider about:  The right schedule for you to have regular tests and exams.  Things you can do on your own to prevent diseases and keep yourself healthy. What should I know about diet, weight, and exercise? Eat a healthy diet   Eat a diet that includes plenty of vegetables, fruits, low-fat dairy products, and lean protein.  Do not eat a lot of foods that are high in solid fats, added sugars, or sodium. Maintain a healthy weight Body mass index (BMI) is used to identify weight problems. It estimates body fat based on height and weight. Your health care provider can help determine your BMI and help you achieve or maintain a healthy weight. Get regular exercise Get regular exercise. This is one of the most important things you can do for your health. Most  adults should:  Exercise for at least 150 minutes each week. The exercise should increase your heart rate and make you sweat (moderate-intensity exercise).  Do strengthening exercises at least twice a week. This is in addition to the moderate-intensity exercise.  Spend less time sitting. Even light physical activity can be beneficial. Watch cholesterol and blood lipids Have your blood tested for lipids and cholesterol at 41 years of age, then have this test every 5 years. Have your cholesterol levels checked more often if:  Your lipid or cholesterol levels are high.  You are older than 41 years of age.  You are at high risk for heart disease. What should I know about cancer screening? Depending on your health history and family history, you may need to have cancer screening at various ages. This may include screening for:  Breast cancer.  Cervical cancer.  Colorectal cancer.  Skin cancer.  Lung cancer. What should I know about heart disease, diabetes, and high blood pressure? Blood pressure and heart disease  High blood pressure causes heart disease and increases the risk of stroke. This is more likely to develop in people who have high blood pressure readings, are of African descent, or are overweight.  Have your blood pressure checked: ? Every 3-5 years if you are 57-54 years of age. ? Every year if you are 83 years old or older. Diabetes Have regular diabetes screenings. This checks your fasting blood sugar level. Have the screening done:  Once every three  years after age 62 if you are at a normal weight and have a low risk for diabetes.  More often and at a younger age if you are overweight or have a high risk for diabetes. What should I know about preventing infection? Hepatitis B If you have a higher risk for hepatitis B, you should be screened for this virus. Talk with your health care provider to find out if you are at risk for hepatitis B infection. Hepatitis  C Testing is recommended for:  Everyone born from [birth year] through 1967.  Anyone with known risk factors for hepatitis C. Sexually transmitted infections (STIs)  Get screened for STIs, including gonorrhea and chlamydia, if: ? You are sexually active and are younger than 41 years of age. ? You are older than 41 years of age and your health care provider tells you that you are at risk for this type of infection. ? Your sexual activity has changed since you were last screened, and you are at increased risk for chlamydia or gonorrhea. Ask your health care provider if you are at risk.  Ask your health care provider about whether you are at high risk for HIV. Your health care provider may recommend a prescription medicine to help prevent HIV infection. If you choose to take medicine to prevent HIV, you should first get tested for HIV. You should then be tested every 3 months for as long as you are taking the medicine. Pregnancy  If you are about to stop having your period (premenopausal) and you may become pregnant, seek counseling before you get pregnant.  Take 400 to 800 micrograms (mcg) of folic acid every day if you become pregnant.  Ask for birth control (contraception) if you want to prevent pregnancy. Osteoporosis and menopause Osteoporosis is a disease in which the bones lose minerals and strength with aging. This can result in bone fractures. If you are 72 years old or older, or if you are at risk for osteoporosis and fractures, ask your health care provider if you should:  Be screened for bone loss.  Take a calcium or vitamin D supplement to lower your risk of fractures.  Be given hormone replacement therapy (HRT) to treat symptoms of menopause. Follow these instructions at home: Lifestyle  Do not use any products that contain nicotine or tobacco, such as cigarettes, e-cigarettes, and chewing tobacco. If you need help quitting, ask your health care provider.  Do not use street  drugs.  Do not share needles.  Ask your health care provider for help if you need support or information about quitting drugs. Alcohol use  Do not drink alcohol if: ? Your health care provider tells you not to drink. ? You are pregnant, may be pregnant, or are planning to become pregnant.  If you drink alcohol: ? Limit how much you use to 0-1 drink a day. ? Limit intake if you are breastfeeding.  Be aware of how much alcohol is in your drink. In the [place] one drink equals one 12 oz bottle of beer (355 mL), one 5 oz glass of wine (148 mL), or one 1 oz glass of hard liquor (44 mL). General instructions  Schedule regular health, dental, and eye exams.  Stay current with your vaccines.  Tell your health care provider if: ? You often feel depressed. ? You have ever been abused or do not feel safe at home. Summary  Adopting a healthy lifestyle and getting preventive care are important in promoting health and wellness.  Follow your health care provider's instructions about healthy diet, exercising, and getting tested or screened for diseases.  Follow your health care provider's instructions on monitoring your cholesterol and blood pressure. This information is not intended to replace advice given to you by your health care provider. Make sure you discuss any questions you have with your health care provider. Document Revised: 06/19/2018 Document Reviewed: 06/19/2018 Elsevier Patient Education  2020 [employer].

## 2020-03-18 NOTE — Telephone Encounter (Signed)
Due to her elevation in LFTs we do not recommend the use of Tylenol or NSAIDs.  If her gynecologist plans on following lab work closely she can try taking NSAIDs as needed.

## 2020-03-19 MED ORDER — TRAMADOL HCL 50 MG PO TABS: 50.0000 mg | ORAL_TABLET | Freq: Four times a day (QID) | ORAL | 0 refills | Status: DC | PRN

## 2020-03-19 NOTE — Telephone Encounter (Signed)
Patient informed, Rx called in.

## 2020-03-22 ENCOUNTER — Ambulatory Visit: Payer: 59 | Admitting: Rheumatology

## 2020-03-24 ENCOUNTER — Ambulatory Visit
Admission: EM | Admit: 2020-03-24 | Discharge: 2020-03-24 | Disposition: A | Discharge status: Home / Self Care | Payer: 59 | Attending: Emergency Medicine | Admitting: Emergency Medicine

## 2020-03-24 DIAGNOSIS — Z20822 Contact with and (suspected) exposure to covid-19: Secondary | ICD-10-CM

## 2020-03-24 NOTE — ED Triage Notes (Signed)
covid exposure at school

## 2020-03-26 ENCOUNTER — Telehealth: Payer: 59 | Admitting: Family Medicine

## 2020-03-26 ENCOUNTER — Encounter: Payer: Self-pay | Admitting: Emergency Medicine

## 2020-03-26 ENCOUNTER — Other Ambulatory Visit: Payer: Self-pay

## 2020-03-26 DIAGNOSIS — J0140 Acute pansinusitis, unspecified: Secondary | ICD-10-CM | POA: Diagnosis not present

## 2020-03-26 MED ORDER — AMOXICILLIN-POT CLAVULANATE 875-125 MG PO TABS: 1.0000 | ORAL_TABLET | Freq: Two times a day (BID) | ORAL | 0 refills | Status: DC

## 2020-03-26 NOTE — Progress Notes (Signed)
Virtual Visit Note  I connected with patient on 03/26/20 at 547pm by phone and verified that I am speaking with the correct person using two identifiers. Theresa Sawyer is currently located at home and paitent is currently with them during visit. The provider, Rutherford Guys, MD is located in their office at time of visit.  I discussed the limitations, risks, security and privacy concerns of performing an evaluation and management service by telephone and the availability of in person appointments. I also discussed with the patient that there may be a patient responsible charge related to this service. The patient expressed understanding and agreed to proceed.   I provided 9 minutes of non-face-to-face time during this encounter.  Chief Complaint  Patient presents with  . Illness    pt is awaiting covid results, thinks this is only allergy flare face is staring to swell. Not sure what to take for the sx . Cannot not take nsids    HPI ? Her son has been sick and they both got tested 2 days ago - pending results She felt fine when she got tested This morning however woke up with very puffy face, nasal congestion, teeth are hurting No headaches, fever or chills, ear pain Mild sore throat, irritated from drainage No sneezing or itchy face/ears, no watery eyes Having productive cough but SOB No changes to taste or smell No diarrhea, nausea or vomiting Avoids OTC meds as avoids APAP and NSAIDs due to rheum She has been taking flonase which is not helping Has been quarantining Reminds her of previous sinus infections   Allergies  Allergen Reactions  . Nsaids     Prior to Admission medications   Medication Sig Start Date End Date Taking? Authorizing Provider  ALPRAZolam Duanne Moron) 0.5 MG tablet TAKE 1 TABLET BY MOUTH ONCE DAILY AS NEEDED FOR ANXIETY 03/18/20  Yes Sagardia, Ines Bloomer, MD  escitalopram (LEXAPRO) 20 MG tablet TAKE 1 TABLET(20 MG) BY MOUTH DAILY 02/16/20  Yes Sagardia,  Ines Bloomer, MD  famotidine (PEPCID) 40 MG tablet TAKE 1 TABLET(40 MG) BY MOUTH TWICE DAILY 12/23/19  Yes Thornton Park, MD  Ferrous Sulfate (IRON PO) Take 2 tablets by mouth every evening.    Yes [provider]  megestrol (MEGACE) 40 MG tablet Take 40 mg by mouth 2 (two) times daily.   Yes [provider]  Multiple Vitamins-Minerals (MULTIVITAMIN WITH MINERALS) tablet Take 1 tablet by mouth daily.   Yes [provider]  traMADol (ULTRAM) 50 MG tablet Take 1 tablet (50 mg total) by mouth every 6 (six) hours as needed. 03/19/20  Yes Joseph Pierini, MD  triamcinolone cream (KENALOG) 0.1 % Apply 1 application topically 2 (two) times daily.   Yes [provider]    Past Medical History:  Diagnosis Date  . Abnormal uterine bleeding (AUB)   . Anxiety   . GERD (gastroesophageal reflux disease)   . History of chest pain (10-20-2019  pt denies any cardiac s&s)   mutliple ED visits, atypical chest pain, chest wall pain, muscularskeletal chest pain;  pt had cardiology evaulation dated 04-23-2018 note in epic , state atypical chest wall pain with negative d dimer and troponin's multiple times in epic, suggested CTA  (pt did not get done)  . Hyperlipidemia   . IDA (iron deficiency anemia)   . OSA on CPAP    followed by dr Dia Sitter  . Psoriasis   . Psoriatic arthritis [facility])     Past Surgical History:  Procedure  Laterality Date  . DILATATION & CURETTAGE/HYSTEROSCOPY WITH MYOSURE N/A 10/24/2019   Procedure: DILATATION & CURETTAGE/HYSTEROSCOPY WITH MYOSURE;  Surgeon: Joseph Pierini, MD;  Location: [facility];  Service: Gynecology;  Laterality: N/A;  . TUBAL LIGATION Bilateral 02/05/2013   Procedure: POST PARTUM TUBAL LIGATION;  Surgeon: Woodroe Mode, MD;  Location: [facility];  Service: Gynecology;  Laterality: Bilateral;    Filshie clips    Social History   Tobacco Use  . Smoking status: Current Some Day Smoker    Packs/day: 0.50    Years:  20.00    Pack years: 10.00    Types: Cigarettes  . Smokeless tobacco: Never Used  Substance Use Topics  . Alcohol use: No    Family History  Problem Relation Age of Onset  . Diabetes Maternal Grandmother   . Diabetes Paternal Grandmother   . Anxiety disorder Mother   . Psoriasis Sister   . Heart attack Brother 34       deceased from this  . Anxiety disorder Brother   . Cirrhosis Maternal Grandfather        alcohol related  . Allergy (severe) Son   . Healthy Son   . Colon cancer Neg Hx   . Esophageal cancer Neg Hx   . Colon polyps Neg Hx   . Stomach cancer Neg Hx   . Pancreatic cancer Neg Hx     ROS Per hpi  Objective  Vitals as reported by the patient: none    ASSESSMENT and PLAN  1. Acute non-recurrent pansinusitis Discussed supportive measures, new meds r/se/b and RTC precautions.  Other orders - amoxicillin-clavulanate (AUGMENTIN) 875-125 MG tablet; Take 1 tablet by mouth 2 (two) times daily.  FOLLOW-UP: prn   The above assessment and management plan was discussed with the patient. The patient verbalized understanding of and has agreed to the management plan. Patient is aware to call the clinic if symptoms persist or worsen. Patient is aware when to return to the clinic for a follow-up visit. Patient educated on when it is appropriate to go to the emergency department.     Rutherford Guys, MD [facility] at [facility] [place], [place] [address] Ph.  [phone] Fax [phone]

## 2020-03-27 LAB — NOVEL CORONAVIRUS, NAA: SARS-CoV-2, NAA: NOT DETECTED

## 2020-04-01 ENCOUNTER — Ambulatory Visit (INDEPENDENT_AMBULATORY_CARE_PROVIDER_SITE_OTHER): Payer: 59 | Admitting: Obstetrics and Gynecology

## 2020-04-01 ENCOUNTER — Other Ambulatory Visit: Payer: Self-pay

## 2020-04-01 ENCOUNTER — Encounter: Payer: Self-pay | Admitting: Obstetrics and Gynecology

## 2020-04-01 ENCOUNTER — Telehealth: Payer: Self-pay

## 2020-04-01 VITALS — BP 130/80

## 2020-04-01 DIAGNOSIS — N938 Other specified abnormal uterine and vaginal bleeding: Secondary | ICD-10-CM | POA: Diagnosis not present

## 2020-04-01 DIAGNOSIS — R102 Pelvic and perineal pain: Secondary | ICD-10-CM

## 2020-04-01 DIAGNOSIS — N939 Abnormal uterine and vaginal bleeding, unspecified: Secondary | ICD-10-CM | POA: Diagnosis not present

## 2020-04-01 NOTE — Progress Notes (Signed)
ANNI HOCEVAR 07-Nov-1980 [id number]  SUBJECTIVE:  39 y.o. [id number] female presents for preoperative discussion of upcoming surgery next week to evaluate her pelvic pain and address her chronically heavy periods.  She did have a hysteroscopy D&C 10/24/2019 earlier this year with removal of benign endometrial polyps and benign endometrium.   Current Outpatient Medications  Medication Sig Dispense Refill  . ALPRAZolam (XANAX) 0.5 MG tablet TAKE 1 TABLET BY MOUTH ONCE DAILY AS NEEDED FOR ANXIETY 30 tablet 1  . escitalopram (LEXAPRO) 20 MG tablet TAKE 1 TABLET(20 MG) BY MOUTH DAILY 90 tablet 3  . famotidine (PEPCID) 40 MG tablet TAKE 1 TABLET(40 MG) BY MOUTH TWICE DAILY 60 tablet 3  . megestrol (MEGACE) 40 MG tablet Take 40 mg by mouth 2 (two) times daily.    . Multiple Vitamins-Minerals (MULTIVITAMIN WITH MINERALS) tablet Take 1 tablet by mouth daily.    Marland Kitchen triamcinolone cream (KENALOG) 0.1 % Apply 1 application topically 2 (two) times daily.    Marland Kitchen amoxicillin-clavulanate (AUGMENTIN) 875-125 MG tablet Take 1 tablet by mouth 2 (two) times daily. (Patient not taking: Reported on 04/01/2020) 10 tablet 0  . Ferrous Sulfate (IRON PO) Take 2 tablets by mouth every evening.  (Patient not taking: Reported on 04/01/2020)    . traMADol (ULTRAM) 50 MG tablet Take 1 tablet (50 mg total) by mouth every 6 (six) hours as needed. (Patient not taking: Reported on 04/01/2020) 12 tablet 0   No current facility-administered medications for this visit.   Allergies: Nsaids  No LMP recorded (lmp unknown). (Menstrual status: Irregular Periods).  Past medical history,surgical history, problem list, medications, allergies, family history and social history were all reviewed and documented as reviewed in the EPIC chart.   OBJECTIVE:  BP 130/80   LMP  (LMP Unknown)  The patient appears well, alert, oriented, in no distress.    ASSESSMENT:  39 y.o. [id number] here for preoperative discussion  PLAN:  Proceed with  diagnostic laparoscopy and D&C with endometrial ablation.  We will evaluate the pelvic area for endometriosis given her dysmenorrhea , pelvic pain and dyspareunia.  I discussed with her the NovaSure endometrial ablation system that we use here.  I discussed the mode of action using radiofrequency energy.  In the same procedure with the ablation I also recommend performing dilation and curettage to evaluate for any intrauterine pathology.  I discussed the same-day outpatient intent of the procedure.  I discussed the typical postoperative bloating and referred shoulder pain with laparoscopy.  I discussed that she will need to plan to be off of work for potentially up to 2-3 weeks but may be able go back sooner in a limited capacity keeping in mind lifting restrictions of 10 to 20 lb and no repetitive squatting, bending, or straining maneuvers especially in the first 2 weeks.  Pelvic rest restriction was also reviewed.   There are risks of infection, bleeding, possibility of needing a blood transfusion, injury to bowel, bladder, major pelvic vessels, ureter, nerves from positioning and or skin irritation, and deep venous thrombosis.  General anesthesia also has risks including myocardial infarction, stroke, and death.  Generally the complication rate is less than 1%.  Conversion to laparotomy may be deemed necessary at the time of the procedure, which if performed would result in longer hospital stay, and more postoperative pain and healing time.  Any significant complications could result in need to readmit to hospital and/or perform further surgery to correct complications.  Anesthesia will either be general and inherent  risks with being placed under anesthesia include myocardial infarction, stroke, rarely death.  Endometrial ablation has satisfaction rates in the 80-90% range, and I reminded the patient to not expect to be completely amenorrheic after the procedure, and that the endometrial scarring process can  take several months to complete.  I discussed the possibility of post ablation pain syndrome and potential for failure of the ablation within few years after having the procedure, either of which would prompt consideration of hysterectomy.  Repeating endometrial ablation is not recommended.  She has had a previous tubal ligation.  If endometrial ablation does fail and the heavy menstrual bleeding continues, hysterectomy would be indicated if not interested in reverting back to the appropriate medical options.    We will make sure she does have preoperative clearance from her primary medical doctor, if we do not then we will need to postpone.  She tells me she cannot take NSAIDs due to her rheumatologic issues and medications, and also apparently not take Tylenol due to low-grade elevation of LFTs.  We discussed need for narcotic pain relievers after surgery.   Joseph Pierini MD 04/01/20

## 2020-04-01 NOTE — Telephone Encounter (Signed)
I received staff message from Dr. Delilah Shan: "She has a lot of medical conditions can we have her see PCP before surgery to get cleared formally? I think that also may have not happened because she canceled and rescheduled surgery. She told me she was 'cleared' but I do not see documentation of that by her PCP."  I spoke with her PCP office. She did see her PCP on 03/18/20. Their protocol requires a special visit for medical clearance for surgery. They can see her tomorrow. Patient called and schedule the appointment for tomorrow at 2pm.  Per Langley Gauss, RN in PAT I moved her Covid test to Saturday morning to allow her to be able to make the visit tomorrow with PCP. She knows out office is closed tomorrow and I will talk with her Monday. Med Clearance request form faxed to her PCP

## 2020-04-01 NOTE — Telephone Encounter (Signed)
Thank you so much

## 2020-04-02 ENCOUNTER — Other Ambulatory Visit (HOSPITAL_COMMUNITY): Payer: 59

## 2020-04-02 ENCOUNTER — Other Ambulatory Visit: Payer: Self-pay

## 2020-04-02 ENCOUNTER — Encounter (HOSPITAL_BASED_OUTPATIENT_CLINIC_OR_DEPARTMENT_OTHER): Payer: Self-pay | Admitting: Obstetrics and Gynecology

## 2020-04-02 ENCOUNTER — Ambulatory Visit: Payer: 59 | Admitting: Family Medicine

## 2020-04-02 ENCOUNTER — Encounter: Payer: Self-pay | Admitting: Family Medicine

## 2020-04-02 VITALS — BP 138/87 | HR 84 | Temp 98.6°F | Resp 15 | Ht 65.0 in | Wt 251.0 lb

## 2020-04-02 DIAGNOSIS — M1909 Primary osteoarthritis, other specified site: Secondary | ICD-10-CM

## 2020-04-02 DIAGNOSIS — Z862 Personal history of diseases of the blood and blood-forming organs and certain disorders involving the immune mechanism: Secondary | ICD-10-CM

## 2020-04-02 DIAGNOSIS — Z8742 Personal history of other diseases of the female genital tract: Secondary | ICD-10-CM

## 2020-04-02 DIAGNOSIS — L409 Psoriasis, unspecified: Secondary | ICD-10-CM | POA: Diagnosis not present

## 2020-04-02 DIAGNOSIS — Z01810 Encounter for preprocedural cardiovascular examination: Secondary | ICD-10-CM | POA: Diagnosis not present

## 2020-04-02 LAB — POCT CBC
Granulocyte percent: 57.8 %G (ref 37–80)
HCT, POC: 44 % — AB (ref 29–41)
Hemoglobin: 14.6 g/dL (ref 11–14.6)
Lymph, poc: 4.4 — AB (ref 0.6–3.4)
MCH, POC: 28.9 pg (ref 27–31.2)
MCHC: 33.3 g/dL (ref 31.8–35.4)
MCV: 87 fL (ref 76–111)
MID (cbc): 0.9 (ref 0–0.9)
MPV: 6.8 fL (ref 0–99.8)
POC Granulocyte: 7.3 — AB (ref 2–6.9)
POC LYMPH PERCENT: 35.1 %L (ref 10–50)
POC MID %: 7.1 %M (ref 0–12)
Platelet Count, POC: 400 10*3/uL (ref 142–424)
RBC: 5.06 M/uL (ref 4.04–5.48)
RDW, POC: 15.1 %
WBC: 12.6 10*3/uL — AB (ref 4.6–10.2)

## 2020-04-02 NOTE — Patient Instructions (Addendum)
You have been cleared for surgery.  Report is being faxed in.  I would like you to also hand carry a copy of it.  EKG was normal and I am checking a CBC.  I will let you know if there is any problems with your blood count.  In the long run I encourage you to be certain to get your ONGEX-52 vaccination, and to work on regular exercise and eating less to seek to try and lose some weight.  Follow-up as needed.   If you have lab work done today you will be contacted with your lab results within the next 2 weeks.  If you have not heard from [place] then please contact us. The fastest way to get your results is to register for My Chart.   IF you received an x-ray today, you will receive an invoice from [facility]. Please contact [facility] at [phone] with questions or concerns regarding your invoice.   IF you received labwork today, you will receive an invoice from [facility]. Please contact [facility] at [phone] with questions or concerns regarding your invoice.   Our billing staff will not be able to assist you with questions regarding bills from these companies.  You will be contacted with the lab results as soon as they are available. The fastest way to get your results is to activate your My Chart account. Instructions are located on the last page of this paperwork. If you have not heard from [place] regarding the results in 2 weeks, please contact this office.

## 2020-04-02 NOTE — Progress Notes (Addendum)
Spoke w/ via phone for pre-op interview---pt Lab needs dos----   Urine poct            Lab results------cbc, ekg 04-02-2020 epic, medical clearance note dr hopper 04-02-2020 epic clearance note dr hopper and ekg both done 04-02-2020 on chart COVID test ------04-03-2020 1110  Arrive at -------900 am 04-06-2020 NPO after MN NO Solid Food.  Clear liquids from MN until--800 am then npo- Medications to take morning of surgery -----famotidine, escitalopram, xanax prn, megace Diabetic medication -----n/a Patient Special Instructions -----bring cpap mask tubing and machine and leave in car Pre-Op special Istructions -----n/a Patient verbalized understanding of instructions that were given at this phone interview. Patient denies shortness of breath, chest pain, fever, cough at this phone interview.

## 2020-04-02 NOTE — Progress Notes (Signed)
Patient ID: Theresa Sawyer, female    DOB: 1981-03-18  Age: 39 y.o. MRN: [id number]  Chief Complaint  Patient presents with  . Medical Clearance    pt has surgery 04/06/2020 for laproscopic ablasion     Subjective:  Preop medical examination:  Patient is here for her preoperative medical examination.  This was requested by Dr. Joseph Pierini, MD from [facility].  She is scheduled for surgery on 04/06/2020, for a diagnostic laparoscopy, possible fulguration of endometriosis, D&C, and NovaSure endometrial ablation.  The patient has been having heavy vaginal bleeding all year.  She had a D&C back in the spring, but has resumed and persisted with the continuous bleeding since that time.  Therefore the above surgeries are scheduled.  The note was received for preoperative medical clearance.  Past medical history: Generally has been fairly healthy.  She has terrible psoriasis which has been being managed by dermatology and rheumatology.  She has a lot of arthritis which has been level of being osteoarthritis though judging by the tests have been done on her I am sure they are trying to assess for the possibility of psoriatic arthritis.  She has chronic anxiety and depression and has been on medication for that.  She has chronic dyspepsia and takes famotidine for that.  Surgeries: D&C.  Also had a bilateral tubal ligation in 2014.  She is gravida 1 para 1 Medical illnesses: None except for the anemia with bleeding Medications: Alprazolam as needed rarely as needed, escitalopram 20 mg daily Megace 40 mg twice daily to try and help the bleeding Multivitamin Topical triamcinolone  Family history: Unremarkable Social history: Single, 1 child, waitress.  Lives in [place].  Review of systems: Constitutional: Less energy since she has been bleeding a lot. HEENT: Unremarkable Cardiovascular: Unremarkable Respiratory: Unremarkable GI: Dyspepsia GU: Vaginal bleeding as noted  above Musculoskeletal: Multiple joints ache.  Rheumatologist is evaluating Dermatologic: Diffuse psoriasis for years.  Dental: She had a crack over front tooth which has been repaired Neurologic: Unremarkable Psychiatric: Depression as noted above, stable on medication.  Rarely takes her Xanax.  Current allergies, medications, problem list, past/family and social histories reviewed.  Objective:  BP 138/87   Pulse 84   Temp 98.6 F (37 C) (Temporal)   Resp 15   Ht 5' 5"  (1.651 m)   Wt 251 lb (113.9 kg)   LMP  (LMP Unknown)   SpO2 98%   BMI 41.77 kg/m   Obese lady in no major distress.  TMs normal.  Eyes PERRL.  EOMs intact.  Throat clear.  Dentition looks okay but she apparently has had a little repair of one of her front teeth.  Chest clear to auscultation.  Heart regular without murmurs gallops or arrhythmias.  Abdomen soft epigastric tenderness.  Psoriasis as noted above.  Extremities without edema.  Negative Homans' sign.  No calf tenderness.  EKG was reviewed and is normal.  With normal sinus rhythm.  Assessment & Plan:   Assessment: 1. Preoperative cardiovascular examination   2. Hx of vaginal bleeding   3. Hx of iron deficiency anemia   4. Psoriasis   5. Primary osteoarthritis of other site       Plan: Patient is given clearance for surgery.  I did make note of the history of the repaired front tooth so that anesthesia will be careful not to put any pressure to it.  Orders Placed This Encounter  Procedures  . POCT CBC  . EKG 12-Lead  No orders of the defined types were placed in this encounter.        Patient Instructions   You have been cleared for surgery.  Report is being faxed in.  I would like you to also hand carry a copy of it.  EKG was normal and I am checking a CBC.  I will let you know if there is any problems with your blood count.  In the long run I encourage you to be certain to get your [id number] vaccination, and to work on regular  exercise and eating less to seek to try and lose some weight.  Follow-up as needed.   If you have lab work done today you will be contacted with your lab results within the next 2 weeks.  If you have not heard from [place] then please contact us. The fastest way to get your results is to register for My Chart.   IF you received an x-ray today, you will receive an invoice from [facility]. Please contact [facility] at [phone] with questions or concerns regarding your invoice.   IF you received labwork today, you will receive an invoice from [facility]. Please contact [facility] at [phone] with questions or concerns regarding your invoice.   Our billing staff will not be able to assist you with questions regarding bills from these companies.  You will be contacted with the lab results as soon as they are available. The fastest way to get your results is to activate your My Chart account. Instructions are located on the last page of this paperwork. If you have not heard from [place] regarding the results in 2 weeks, please contact this office.        Return if symptoms worsen or fail to improve.   Ruben Reason, MD 04/02/2020

## 2020-04-03 ENCOUNTER — Other Ambulatory Visit (HOSPITAL_COMMUNITY)
Admission: RE | Admit: 2020-04-03 | Discharge: 2020-04-03 | Disposition: A | Discharge status: Home / Self Care | Payer: 59 | Source: Ambulatory Visit | Attending: Obstetrics and Gynecology | Admitting: Obstetrics and Gynecology

## 2020-04-03 DIAGNOSIS — Z01812 Encounter for preprocedural laboratory examination: Secondary | ICD-10-CM | POA: Insufficient documentation

## 2020-04-03 DIAGNOSIS — Z20822 Contact with and (suspected) exposure to covid-19: Secondary | ICD-10-CM | POA: Insufficient documentation

## 2020-04-03 LAB — SARS CORONAVIRUS 2 (TAT 6-24 HRS): SARS Coronavirus 2: NEGATIVE

## 2020-04-05 ENCOUNTER — Encounter (HOSPITAL_BASED_OUTPATIENT_CLINIC_OR_DEPARTMENT_OTHER): Payer: Self-pay | Admitting: Obstetrics and Gynecology

## 2020-04-05 NOTE — Telephone Encounter (Signed)
Medical clearance form received signed by PCP clearing patient for surgery 04/06/20.

## 2020-04-06 ENCOUNTER — Ambulatory Visit (HOSPITAL_BASED_OUTPATIENT_CLINIC_OR_DEPARTMENT_OTHER)
Admission: RE | Admit: 2020-04-06 | Discharge: 2020-04-06 | Disposition: A | Discharge status: Home / Self Care | Payer: 59 | Attending: Obstetrics and Gynecology | Admitting: Obstetrics and Gynecology

## 2020-04-06 ENCOUNTER — Encounter (HOSPITAL_BASED_OUTPATIENT_CLINIC_OR_DEPARTMENT_OTHER): Payer: Self-pay | Admitting: Obstetrics and Gynecology

## 2020-04-06 ENCOUNTER — Ambulatory Visit (HOSPITAL_BASED_OUTPATIENT_CLINIC_OR_DEPARTMENT_OTHER): Payer: 59 | Admitting: Anesthesiology

## 2020-04-06 ENCOUNTER — Encounter (HOSPITAL_BASED_OUTPATIENT_CLINIC_OR_DEPARTMENT_OTHER)
Admission: RE | Disposition: A | Discharge status: Home / Self Care | Payer: Self-pay | Source: Home / Self Care | Attending: Obstetrics and Gynecology

## 2020-04-06 ENCOUNTER — Encounter: Payer: Self-pay | Admitting: Gynecology

## 2020-04-06 DIAGNOSIS — E785 Hyperlipidemia, unspecified: Secondary | ICD-10-CM | POA: Diagnosis not present

## 2020-04-06 DIAGNOSIS — Z79899 Other long term (current) drug therapy: Secondary | ICD-10-CM | POA: Insufficient documentation

## 2020-04-06 DIAGNOSIS — M1990 Unspecified osteoarthritis, unspecified site: Secondary | ICD-10-CM | POA: Insufficient documentation

## 2020-04-06 DIAGNOSIS — K219 Gastro-esophageal reflux disease without esophagitis: Secondary | ICD-10-CM | POA: Diagnosis not present

## 2020-04-06 DIAGNOSIS — Z886 Allergy status to analgesic agent status: Secondary | ICD-10-CM | POA: Diagnosis not present

## 2020-04-06 DIAGNOSIS — G4733 Obstructive sleep apnea (adult) (pediatric): Secondary | ICD-10-CM | POA: Insufficient documentation

## 2020-04-06 DIAGNOSIS — F329 Major depressive disorder, single episode, unspecified: Secondary | ICD-10-CM | POA: Diagnosis not present

## 2020-04-06 DIAGNOSIS — N920 Excessive and frequent menstruation with regular cycle: Secondary | ICD-10-CM | POA: Insufficient documentation

## 2020-04-06 DIAGNOSIS — G8929 Other chronic pain: Secondary | ICD-10-CM | POA: Insufficient documentation

## 2020-04-06 DIAGNOSIS — F419 Anxiety disorder, unspecified: Secondary | ICD-10-CM | POA: Insufficient documentation

## 2020-04-06 DIAGNOSIS — N736 Female pelvic peritoneal adhesions (postinfective): Secondary | ICD-10-CM

## 2020-04-06 DIAGNOSIS — N939 Abnormal uterine and vaginal bleeding, unspecified: Secondary | ICD-10-CM

## 2020-04-06 DIAGNOSIS — N938 Other specified abnormal uterine and vaginal bleeding: Secondary | ICD-10-CM

## 2020-04-06 HISTORY — PX: LAPAROSCOPY: SHX197

## 2020-04-06 HISTORY — DX: Depression, unspecified: F32.A

## 2020-04-06 HISTORY — DX: Unspecified osteoarthritis, unspecified site: M19.90

## 2020-04-06 HISTORY — PX: HYSTEROSCOPY WITH NOVASURE: SHX5574

## 2020-04-06 HISTORY — DX: Other specified health status: Z78.9

## 2020-04-06 LAB — POCT PREGNANCY, URINE: Preg Test, Ur: NEGATIVE

## 2020-04-06 SURGERY — LAPAROSCOPY, DIAGNOSTIC
Anesthesia: General | Site: Vagina

## 2020-04-06 MED ORDER — HYDROMORPHONE HCL 1 MG/ML IJ SOLN
INTRAMUSCULAR | Status: AC
Start: 2020-04-06 — End: ?
  Filled 2020-04-06: qty 1

## 2020-04-06 MED ORDER — MIDAZOLAM HCL 2 MG/2ML IJ SOLN
INTRAMUSCULAR | Status: AC
  Filled 2020-04-06: qty 2

## 2020-04-06 MED ORDER — EPHEDRINE SULFATE 50 MG/ML IJ SOLN
INTRAMUSCULAR | Status: DC | PRN
  Administered 2020-04-06: 7 mg via INTRAVENOUS

## 2020-04-06 MED ORDER — LIDOCAINE HCL (CARDIAC) PF 100 MG/5ML IV SOSY
PREFILLED_SYRINGE | INTRAVENOUS | Status: DC | PRN
  Administered 2020-04-06: 80 mg via INTRAVENOUS

## 2020-04-06 MED ORDER — FENTANYL CITRATE (PF) 100 MCG/2ML IJ SOLN
INTRAMUSCULAR | Status: DC | PRN
Start: 2020-04-06 — End: 2020-04-06
  Administered 2020-04-06: 50 ug via INTRAVENOUS
  Administered 2020-04-06: 25 ug via INTRAVENOUS
  Administered 2020-04-06: 100 ug via INTRAVENOUS
  Administered 2020-04-06: 50 ug via INTRAVENOUS
  Administered 2020-04-06: 25 ug via INTRAVENOUS

## 2020-04-06 MED ORDER — SCOPOLAMINE 1 MG/3DAYS TD PT72
1.0000 | MEDICATED_PATCH | TRANSDERMAL | Status: DC
  Administered 2020-04-06: 1.5 mg via TRANSDERMAL

## 2020-04-06 MED ORDER — ROCURONIUM BROMIDE 10 MG/ML (PF) SYRINGE
PREFILLED_SYRINGE | INTRAVENOUS | Status: AC
  Filled 2020-04-06: qty 10

## 2020-04-06 MED ORDER — PROPOFOL 10 MG/ML IV BOLUS
INTRAVENOUS | Status: DC | PRN
  Administered 2020-04-06: 100 mg via INTRAVENOUS
  Administered 2020-04-06: 200 mg via INTRAVENOUS
  Administered 2020-04-06: 100 mg via INTRAVENOUS

## 2020-04-06 MED ORDER — MIDAZOLAM HCL 5 MG/5ML IJ SOLN
INTRAMUSCULAR | Status: DC | PRN
  Administered 2020-04-06 (×2): 1 mg via INTRAVENOUS

## 2020-04-06 MED ORDER — SCOPOLAMINE 1 MG/3DAYS TD PT72
MEDICATED_PATCH | TRANSDERMAL | Status: AC
  Filled 2020-04-06: qty 1

## 2020-04-06 MED ORDER — ACETAMINOPHEN 500 MG PO TABS: 1000.0000 mg | ORAL_TABLET | Freq: Four times a day (QID) | ORAL | 2 refills | Status: AC

## 2020-04-06 MED ORDER — POVIDONE-IODINE 10 % EX SWAB: 2.0000 "application " | Freq: Once | CUTANEOUS | Status: DC

## 2020-04-06 MED ORDER — PROMETHAZINE HCL 25 MG/ML IJ SOLN: 6.2500 mg | INTRAMUSCULAR | Status: DC | PRN

## 2020-04-06 MED ORDER — OXYCODONE HCL 5 MG/5ML PO SOLN: 5.0000 mg | Freq: Once | ORAL | Status: AC | PRN

## 2020-04-06 MED ORDER — LACTATED RINGERS IV SOLN: INTRAVENOUS | Status: DC

## 2020-04-06 MED ORDER — MIDAZOLAM HCL 2 MG/2ML IJ SOLN: 0.5000 mg | Freq: Once | INTRAMUSCULAR | Status: DC | PRN

## 2020-04-06 MED ORDER — PROPOFOL 10 MG/ML IV BOLUS
INTRAVENOUS | Status: AC
  Filled 2020-04-06: qty 20

## 2020-04-06 MED ORDER — MEPERIDINE HCL 25 MG/ML IJ SOLN: 6.2500 mg | INTRAMUSCULAR | Status: DC | PRN

## 2020-04-06 MED ORDER — ACETAMINOPHEN 500 MG PO TABS
1000.0000 mg | ORAL_TABLET | Freq: Once | ORAL | Status: AC
  Administered 2020-04-06: 1000 mg via ORAL

## 2020-04-06 MED ORDER — EPHEDRINE 5 MG/ML INJ
INTRAVENOUS | Status: AC
  Filled 2020-04-06: qty 10

## 2020-04-06 MED ORDER — DEXAMETHASONE SODIUM PHOSPHATE 10 MG/ML IJ SOLN
INTRAMUSCULAR | Status: DC | PRN
  Administered 2020-04-06: 4 mg via INTRAVENOUS

## 2020-04-06 MED ORDER — DEXAMETHASONE SODIUM PHOSPHATE 10 MG/ML IJ SOLN
INTRAMUSCULAR | Status: AC
  Filled 2020-04-06: qty 1

## 2020-04-06 MED ORDER — SUGAMMADEX SODIUM 200 MG/2ML IV SOLN
INTRAVENOUS | Status: DC | PRN
  Administered 2020-04-06: 100 mg via INTRAVENOUS

## 2020-04-06 MED ORDER — BUPIVACAINE HCL (PF) 0.25 % IJ SOLN: 10.0000 mL | Freq: Once | INTRAMUSCULAR | Status: DC

## 2020-04-06 MED ORDER — ACETAMINOPHEN 500 MG PO TABS
ORAL_TABLET | ORAL | Status: AC
  Filled 2020-04-06: qty 2

## 2020-04-06 MED ORDER — OXYCODONE HCL 5 MG PO TABS
5.0000 mg | ORAL_TABLET | Freq: Once | ORAL | Status: AC | PRN
  Administered 2020-04-06: 5 mg via ORAL

## 2020-04-06 MED ORDER — BUPIVACAINE HCL (PF) 0.25 % IJ SOLN
INTRAMUSCULAR | Status: DC | PRN
  Administered 2020-04-06 (×2): 10 mL

## 2020-04-06 MED ORDER — OXYCODONE HCL 5 MG PO TABS
ORAL_TABLET | ORAL | Status: AC
Start: 2020-04-06 — End: ?
  Filled 2020-04-06: qty 1

## 2020-04-06 MED ORDER — HYDROMORPHONE HCL 1 MG/ML IJ SOLN
0.2500 mg | INTRAMUSCULAR | Status: DC | PRN
  Administered 2020-04-06 (×3): 0.25 mg via INTRAVENOUS

## 2020-04-06 MED ORDER — ONDANSETRON HCL 4 MG/2ML IJ SOLN
INTRAMUSCULAR | Status: AC
  Filled 2020-04-06: qty 2

## 2020-04-06 MED ORDER — OXYCODONE HCL 5 MG PO TABS: 5.0000 mg | ORAL_TABLET | Freq: Four times a day (QID) | ORAL | 0 refills | Status: DC | PRN

## 2020-04-06 MED ORDER — FENTANYL CITRATE (PF) 250 MCG/5ML IJ SOLN
INTRAMUSCULAR | Status: AC
  Filled 2020-04-06: qty 5

## 2020-04-06 MED ORDER — ROCURONIUM BROMIDE 100 MG/10ML IV SOLN
INTRAVENOUS | Status: DC | PRN
  Administered 2020-04-06: 10 mg via INTRAVENOUS
  Administered 2020-04-06: 60 mg via INTRAVENOUS

## 2020-04-06 MED ORDER — LIDOCAINE 2% (20 MG/ML) 5 ML SYRINGE
INTRAMUSCULAR | Status: AC
  Filled 2020-04-06: qty 5

## 2020-04-06 MED ORDER — ONDANSETRON HCL 4 MG/2ML IJ SOLN
INTRAMUSCULAR | Status: DC | PRN
  Administered 2020-04-06: 4 mg via INTRAVENOUS

## 2020-04-06 SURGICAL SUPPLY — 59 items
ABLATOR SURESOUND NOVASURE (ABLATOR) ×3 IMPLANT
ADH SKN CLS APL DERMABOND .7 (GAUZE/BANDAGES/DRESSINGS) ×2
APL SRG 38 LTWT LNG FL B (MISCELLANEOUS)
APPLICATOR ARISTA FLEXITIP XL (MISCELLANEOUS) IMPLANT
BAG RETRIEVAL 10 (BASKET)
BAG WASTE FLUENT 6L (MISCELLANEOUS) IMPLANT
BAG WST MGMT FLUENT FLD DISP (MISCELLANEOUS)
BARRIER ADHS 3X4 INTERCEED (GAUZE/BANDAGES/DRESSINGS) IMPLANT
BIPOLAR CUTTING LOOP 21FR (ELECTRODE)
BRR ADH 4X3 ABS CNTRL BYND (GAUZE/BANDAGES/DRESSINGS)
CABLE HIGH FREQUENCY MONO STRZ (ELECTRODE) ×3 IMPLANT
CATH ROBINSON RED A/P 16FR (CATHETERS) ×3 IMPLANT
COVER MAYO STAND STRL (DRAPES) ×3 IMPLANT
COVER WAND RF STERILE (DRAPES) ×3 IMPLANT
DERMABOND ADVANCED (GAUZE/BANDAGES/DRESSINGS) ×1
DERMABOND ADVANCED .7 DNX12 (GAUZE/BANDAGES/DRESSINGS) ×2 IMPLANT
DILATOR CANAL MILEX (MISCELLANEOUS) IMPLANT
DURAPREP 26ML APPLICATOR (WOUND CARE) ×3 IMPLANT
GAUZE 4X4 16PLY RFD (DISPOSABLE) ×3 IMPLANT
GLOVE BIO SURGEON STRL SZ 6.5 (GLOVE) ×3 IMPLANT
GLOVE BIO SURGEON STRL SZ8 (GLOVE) ×6 IMPLANT
GLOVE BIOGEL PI IND STRL 7.0 (GLOVE) ×4 IMPLANT
GLOVE BIOGEL PI IND STRL 8 (GLOVE) ×2 IMPLANT
GLOVE BIOGEL PI INDICATOR 7.0 (GLOVE) ×2
GLOVE BIOGEL PI INDICATOR 8 (GLOVE) ×1
GOWN STRL REUS W/TWL LRG LVL3 (GOWN DISPOSABLE) ×3 IMPLANT
GOWN STRL REUS W/TWL XL LVL3 (GOWN DISPOSABLE) ×6 IMPLANT
HEMOSTAT ARISTA ABSORB 3G PWDR (HEMOSTASIS) IMPLANT
IV NS IRRIG 3000ML ARTHROMATIC (IV SOLUTION) IMPLANT
KIT PROCEDURE FLUENT (KITS) IMPLANT
KIT TURNOVER CYSTO (KITS) ×3 IMPLANT
LIGASURE BLUNT TIP 5 LONG 44CM (ELECTROSURGICAL) IMPLANT
LIGASURE LAP L-HOOKWIRE 5 44CM (INSTRUMENTS) IMPLANT
LIGASURE VESSEL 5MM BLUNT TIP (ELECTROSURGICAL) IMPLANT
LOOP CUTTING BIPOLAR 21FR (ELECTRODE) IMPLANT
NS IRRIG 500ML POUR BTL (IV SOLUTION) ×3 IMPLANT
PACK LAPAROSCOPY BASIN (CUSTOM PROCEDURE TRAY) ×3 IMPLANT
PACK TRENDGUARD 450 HYBRID PRO (MISCELLANEOUS) ×2 IMPLANT
PACK VAGINAL MINOR WOMEN LF (CUSTOM PROCEDURE TRAY) ×3 IMPLANT
PAD OB MATERNITY 4.3X12.25 (PERSONAL CARE ITEMS) ×3 IMPLANT
PAD PREP 24X48 CUFFED NSTRL (MISCELLANEOUS) ×3 IMPLANT
PROTECTOR NERVE ULNAR (MISCELLANEOUS) ×6 IMPLANT
SCISSORS LAP 5X35 DISP (ENDOMECHANICALS) ×3 IMPLANT
SET SUCTION IRRIG HYDROSURG (IRRIGATION / IRRIGATOR) IMPLANT
SET TUBE SMOKE EVAC HIGH FLOW (TUBING) ×3 IMPLANT
SOCKS TISSUE FLUENT (MISCELLANEOUS) IMPLANT
SUT MNCRL AB 4-0 PS2 18 (SUTURE) ×6 IMPLANT
SUT MON AB 4-0 PC3 18 (SUTURE) IMPLANT
SUT VIC AB 2-0 UR6 27 (SUTURE) IMPLANT
SYS BAG RETRIEVAL 10MM (BASKET)
SYSTEM BAG RETRIEVAL 10MM (BASKET) IMPLANT
TOWEL OR 17X26 10 PK STRL BLUE (TOWEL DISPOSABLE) ×3 IMPLANT
TRAY FOLEY W/BAG SLVR 14FR LF (SET/KITS/TRAYS/PACK) IMPLANT
TRENDGUARD 450 HYBRID PRO PACK (MISCELLANEOUS) ×3
TROCAR BALLN 12MMX100 BLUNT (TROCAR) IMPLANT
TROCAR BLADELESS OPT 5 100 (ENDOMECHANICALS) ×6 IMPLANT
TROCAR XCEL NON-BLD 11X100MML (ENDOMECHANICALS) IMPLANT
WARMER LAPAROSCOPE (MISCELLANEOUS) ×3 IMPLANT
WATER STERILE IRR 500ML POUR (IV SOLUTION) IMPLANT

## 2020-04-06 NOTE — Anesthesia Preprocedure Evaluation (Addendum)
Anesthesia Evaluation  Patient identified by MRN, date of birth, ID band Patient awake    Reviewed: Allergy & Precautions, NPO status , Patient's Chart, lab work & pertinent test results  History of Anesthesia Complications Negative for: history of anesthetic complications  Airway Mallampati: II  TM Distance: >3 FB Neck ROM: Full    Dental  (+) Chipped, Dental Advisory Given   Pulmonary sleep apnea and Continuous Positive Airway Pressure Ventilation , Current Smoker and Patient abstained from smoking.,  04/03/2020 SARS coronavirus NEG   breath sounds clear to auscultation       Cardiovascular (-) hypertensionnegative cardio ROS   Rhythm:Regular Rate:Normal     Neuro/Psych negative neurological ROS     GI/Hepatic GERD  Medicated and Controlled,Intermittently elevated LFTs from Megace   Endo/Other  Morbid obesity  Renal/GU negative Renal ROS     Musculoskeletal  (+) Arthritis  (psoriatic),   Abdominal (+) + obese,   Peds  Hematology negative hematology ROS (+)   Anesthesia Other Findings   Reproductive/Obstetrics                            Anesthesia Physical Anesthesia Plan  ASA: III  Anesthesia Plan: General   Post-op Pain Management:    Induction: Intravenous  PONV Risk Score and Plan: 2 and Ondansetron, Dexamethasone and Scopolamine patch - Pre-op  Airway Management Planned: Oral ETT  Additional Equipment: None  Intra-op Plan:   Post-operative Plan: Extubation in OR  Informed Consent: I have reviewed the patients History and Physical, chart, labs and discussed the procedure including the risks, benefits and alternatives for the proposed anesthesia with the patient or authorized representative who has indicated his/her understanding and acceptance.     Dental advisory given  Plan Discussed with: CRNA and Surgeon  Anesthesia Plan Comments:       Anesthesia Quick  Evaluation

## 2020-04-06 NOTE — Transfer of Care (Signed)
Immediate Anesthesia Transfer of Care Note  Patient: Theresa Sawyer  Procedure(s) Performed: LAPAROSCOPY OPERATIVE; LYSIS OF ADHESION (N/A Abdomen) DILATION AND CURRETTAGE; NOVASURE ABLATION (N/A Vagina )  Patient Location: PACU  Anesthesia Type:General  Level of Consciousness: awake, alert , oriented and patient cooperative  Airway & Oxygen Therapy: Patient Spontanous Breathing and Patient connected to face mask oxygen  Post-op Assessment: Report given to RN and Post -op Vital signs reviewed and stable  Post vital signs: Reviewed and stable  Last Vitals:  Vitals Value Taken Time  BP 129/82 04/06/20 1357  Temp    Pulse 79 04/06/20 1357  Resp    SpO2      Last Pain:  Vitals:   04/06/20 0927  TempSrc: Oral  PainSc: 4       Patients Stated Pain Goal: 6 (35/82/51 8984)  Complications: No complications documented.

## 2020-04-06 NOTE — Anesthesia Postprocedure Evaluation (Signed)
Anesthesia Post Note  Patient: Theresa Sawyer  Procedure(s) Performed: LAPAROSCOPY OPERATIVE; LYSIS OF ADHESION (N/A Abdomen) DILATION AND CURRETTAGE; NOVASURE ABLATION (N/A Vagina )     Patient location during evaluation: Phase II Anesthesia Type: General Level of consciousness: awake and alert, patient cooperative and oriented Pain management: pain level controlled Vital Signs Assessment: post-procedure vital signs reviewed and stable Respiratory status: spontaneous breathing, nonlabored ventilation and respiratory function stable Cardiovascular status: blood pressure returned to baseline and stable Postop Assessment: no apparent nausea or vomiting, able to ambulate and adequate PO intake Anesthetic complications: no   No complications documented.  Last Vitals:  Vitals:   04/06/20 1430 04/06/20 1445  BP: 118/76 110/73  Pulse: 77 72  Resp: 16 12  Temp:    SpO2: 93% 93%    Last Pain:  Vitals:   04/06/20 1458  TempSrc:   PainSc: 5                  Ivery Nanney,E. Haylen Shelnutt

## 2020-04-06 NOTE — H&P (Signed)
   Theresa Sawyer 04-24-1981 MRN: [id number]  HPI The patient is a 39 y.o. [id number] who presents today for scheduled diagnostic laparoscopy, possible fulguration/biopsy of endometriosis and D&C with endometrial ablation for dysmenorrhea, pelvic pain, dyspareunia, and menorrhagia.  No changes to her medical history since her pre op exam, denies CP, SOB, fever/chills, dysuria.  Past Medical History:  Diagnosis Date  . Abnormal uterine bleeding (AUB)   . Anxiety   . Arthritis    oa  . Depression   . GERD (gastroesophageal reflux disease)   . History of chest pain (10-20-2019  pt denies any cardiac s&s)   mutliple ED visits, atypical chest pain, chest wall pain, muscularskeletal chest pain;  pt had cardiology evaulation dated 04-23-2018 note in epic , state atypical chest wall pain with negative d dimer and troponin's multiple times in epic, suggested CTA  (pt did not get done)  . Hyperlipidemia   . IDA (iron deficiency anemia)   . OSA on CPAP    followed by dr Dia Sitter  . Psoriasis   . Self-catheterizes urinary bladder    up to 10 x day   Past Surgical History:  Procedure Laterality Date  . DILATATION & CURETTAGE/HYSTEROSCOPY WITH MYOSURE N/A 10/24/2019   Procedure: DILATATION & CURETTAGE/HYSTEROSCOPY WITH MYOSURE;  Surgeon: Joseph Pierini, MD;  Location: [place];  Service: Gynecology;  Laterality: N/A;  . TUBAL LIGATION Bilateral 02/05/2013   Procedure: POST PARTUM TUBAL LIGATION;  Surgeon: Woodroe Mode, MD;  Location: [facility];  Service: Gynecology;  Laterality: Bilateral;    Filshie clips   No current facility-administered medications on file prior to encounter.   Current Outpatient Medications on File Prior to Encounter  Medication Sig Dispense Refill  . ALPRAZolam (XANAX) 0.5 MG tablet TAKE 1 TABLET BY MOUTH ONCE DAILY AS NEEDED FOR ANXIETY 30 tablet 1  . escitalopram (LEXAPRO) 20 MG tablet TAKE 1 TABLET(20 MG) BY MOUTH DAILY (Patient taking differently: at bedtime.  ) 90 tablet 3  . famotidine (PEPCID) 40 MG tablet TAKE 1 TABLET(40 MG) BY MOUTH TWICE DAILY 60 tablet 3  . megestrol (MEGACE) 40 MG tablet Take 40 mg by mouth 2 (two) times daily.    Marland Kitchen triamcinolone cream (KENALOG) 0.1 % Apply 1 application topically as needed.     . Multiple Vitamins-Minerals (MULTIVITAMIN WITH MINERALS) tablet Take 1 tablet by mouth daily.     Allergies  Allergen Reactions  . Nsaids     Do not take per rheumatologist      Physical Exam   BP 133/86   Pulse 72   Temp 98.5 F (36.9 C) (Oral)   Resp 18   Ht 5' (1.524 m)   Wt 113.9 kg   LMP 01/31/2020   SpO2 99%   BMI 49.06 kg/m    General: Pleasant female, no acute distress, alert and oriented CV: RRR, no murmurs Pulm: good respiratory effort, CTAB     Plan Proceed with diagnostic laparoscopy, possible fulguration/biopsy of endometriosis and D&C with endometrial ablation as planned.  All questions answered and the patient agrees to proceed.    Joseph Pierini, MD 04/06/20

## 2020-04-06 NOTE — Op Note (Signed)
Name: Theresa Sawyer  Age: 39 y.o.  Date of Birth: 12-Nov-1980  Medical Record #: [id number]  Operative Note  Preoperative Diagnosis: Chronic pelvic pain, abnormal uterine bleeding, menorrhagia Procedure: Operative laparoscopy with lysis of adhesion, dilation and curettage, Novasure endometrial ablation. Postoperative Diagnosis: same. Surgeon: Joseph Pierini, MD Estimated Blood Loss:  10 mL Anesthesia: General, local with 0.25% bupivacaine (10 mL) Specimens: Endometrial curettings Findings: Normal bilateral fallopian tubes and ovaries.  Previous Filshe clip application noted with right clip pertionealized in the posterior cul de sac.  Vascular adhesion string running from right fallopian tube fimbria to the anterior mid abdominal wall in the vicinity of an omental adhesion to the mid abdominal wall in the midline.  Normal appearing uterus.  No visual evidence of endometriosis.  Normal liver edge.  Retrocecal appendix not well visualized.  Uterus sounds to 8 cm with 3.5 cm cervical length, cavity width 2.9 cm.  Novasure ablation with power of 73 Watts, length of cycle 53 seconds. Complication: none. Date: 04/06/20  DESCRIPTION OF PROCEDURE:    Preoperative review of the procedure was completed with the patient prior to transport to the operating room including risks, benefits, and alternatives and the fact that future pregnancies will not be possible. Risks, benefits and alternatives of permanent contraception were fully discussed with the patient.  The patient's questions were answered and she agreed to proceed.  Under anesthesia, Theresa Sawyer was placed in the dorsolithotomy position with legs in Yellofin stirrups.  SCDs were applied.  A surgical team time out was performed to verify and agree on procedure and patient consent.  The patient was prepped and draped in the usual sterile fashion.  A bimanual exam was performed.  An open sided speculum was placed into the vagina.  A single tooth  tenaculum was attached to the posterior lip of the cervix and a Hulka manipulator was inserted into the cervix and affixed to the anterior lip of the cervix.  Infraumbilical skin was injected with local and a 5 mm incision was made and a 5 mm visiport trocar was placed using the laparoscope and inserted into the abdomen under direct visualization.  Intraperitoneal position was confirmed and pneumoperitoneum was obtained to 15 mmHg.  The laparoscope was inserted verifying an intraperitoneal position as well as verifying absence of injury to internal organs.  No injury to the omentum or bowel was noted after entry into the abdomen.  She was placed in steep Trendelenburg position.   A 5 mm right lateral trocar was placed under laparoscopic guidance, injecting the skin with local and transilluminating the abdominal wall to avoid vessel injury.  Visualization was excellent.  Intraabdominal survey was conducted with findings as above.  Systematic inspection of the pelvis was carried out and there was no evidence of endometriosis in the bilateral ovarian fossa, posterior cul-de-sac, bladder peritoneum, fallopian tubes or on the ovaries.  Findings were as noted above.  The laparoscope was placed through the RLQ trocar and there was noted to be the midline omental adhesion with the the adhesion string to the right fallopian tube fimbria.  The laparoscope was placed back in the infraumbilical trocar and the monopolar endoscopic scissors were placed through the RLQ trocar, the adhesion band to the fimbria was fulgurated and then transected.  The ends of the adhesion were visualized and were hemostatic.  No sign of thermal injury or damage was present in the vicinity of the procedure.  Hemostasis was verified as pneumoperitoneum was evacuated and the trocars  were removed under laparoscopic visualization.  Each skin incision was closed with a subcutaneous stitch using #4-0 Monocryl and covered with Dermabond.    Attention  was directed vaginally.  The cervix was visualized with an open sided speculum placed vaginally.  A paracervical block was applied in the standard fashion using 0.25% bupivacaine.  The tenaculum and Hulka manipulator were removed.  The anterior cervix was grasped with a single-toothed tenaculum. The uterine descensus was noted to be poor.  Cervix was gently dilated using a progressive series of Pratt dilators to size 19.  The uterine cavity was very gently sounded to establish a measurement of uterine cavity depth of 8 cm, internal os was measured at 3.5 cm from the external os, resulting in a cavity length of 4.5 cm.    A thorough curettage was productive of benign appearing endometrial tissue. The curettage was effective at achieving a uterine cry circumferentially. Bleeding was minimal at that point.  All tissue curettings were sent to pathology for review.  There was no concern for uterine perforation.  The Novasure device was prepped for use using the measurements obtained above.  An ablation cycle was initiated after proper seating maneuvers without concern for perforation nor complications of the procedure.  The NovaSure seal test passed.  The ablation cycle was started.  Total time was 53 seconds at 61 Watts.  Instrumentation was removed from the vagina.  The tenaculum sites on the cervix were hemostatic after application of pressure and silver nitrate cautery.  Instrument, sponge, and needle counts were correct at the conclusion of the procedure. The patient tolerated the procedure well and was brought to the recovery room in stable condition.    Joseph Pierini, M.D., Cherlynn June

## 2020-04-06 NOTE — Anesthesia Procedure Notes (Signed)
Procedure Name: Intubation Date/Time: 04/06/2020 12:34 PM Performed by: Garrel Ridgel, CRNA Pre-anesthesia Checklist: Patient identified, Emergency Drugs available, Suction available and Patient being monitored Patient Re-evaluated:Patient Re-evaluated prior to induction Oxygen Delivery Method: Circle system utilized Preoxygenation: Pre-oxygenation with 100% oxygen Induction Type: IV induction Ventilation: Mask ventilation without difficulty Laryngoscope Size: Glidescope Grade View: Grade II Tube type: Oral Tube size: 7.0 mm Number of attempts: 2 Airway Equipment and Method: Stylet and Oral airway Placement Confirmation: ETT inserted through vocal cords under direct vision,  positive ETCO2 and breath sounds checked- equal and bilateral Secured at: 22 cm Tube secured with: Tape Dental Injury: Teeth and Oropharynx as per pre-operative assessment

## 2020-04-06 NOTE — Discharge Instructions (Signed)

## 2020-04-07 ENCOUNTER — Encounter (HOSPITAL_BASED_OUTPATIENT_CLINIC_OR_DEPARTMENT_OTHER): Payer: Self-pay | Admitting: Obstetrics and Gynecology

## 2020-04-07 LAB — SURGICAL PATHOLOGY

## 2020-04-08 ENCOUNTER — Ambulatory Visit: Payer: 59 | Admitting: Family Medicine

## 2020-04-16 ENCOUNTER — Ambulatory Visit (INDEPENDENT_AMBULATORY_CARE_PROVIDER_SITE_OTHER): Payer: 59 | Admitting: Obstetrics and Gynecology

## 2020-04-16 ENCOUNTER — Encounter: Payer: Self-pay | Admitting: Obstetrics and Gynecology

## 2020-04-16 ENCOUNTER — Other Ambulatory Visit: Payer: Self-pay

## 2020-04-16 VITALS — BP 134/84

## 2020-04-16 DIAGNOSIS — Z9889 Other specified postprocedural states: Secondary | ICD-10-CM

## 2020-04-16 NOTE — Progress Notes (Signed)
   DEVONE BONILLA September 29, 1980 [id number]  [place] Chief Complaint  Patient presents with  . Post Op check    HISTORY OF PRESENT ILLNESS [place] FEE presents for routine 2 week post-operative follow up after a operative laparoscopy with lysis of adhesions, dilation and curettage, NovaSure endometrial ablation performed 04/06/2020 for chronic pelvic pain, abnormal uterine bleeding, menorrhagia.  The patient is doing well with no concerns.  She initially was having more discomfort in the pelvic area than she expected but that has tapered off over these past few days.  She denies vaginal bleeding or discharge at this point. She is tolerating normal diet.  Bowel and bladder function are normal.    OBJECTIVE  BP 134/84   LMP  (LMP Unknown)    PHYSICAL EXAM General:  Patient in no acute distress.  Abdomen: Soft, non tender, non-distended.  Incisions at right lower quadrant and umbilicus are clean, dry, intact, well healing, no erythema nor drainage.  ASSESSMENT / PLAN  Routine 2 week post operative check.   The patient is doing well and meeting all postoperative milestones.  I reviewed the benign pathology report describing the endometrial curettings.  I showed her some pictures taken during the laparoscopy.  She had a rubber band-like adhesion from her abdominal wall down to her right adnexa which was transected during the surgery.  Also there was an omental adhesion to the abdominal wall but otherwise no significant findings.  No evidence of endometriosis.  Her uterus appeared globular in shape and possibly this would suggest adenomyosis.  She had a dislodged Filshie clip that had peritonealized in the posterior cul-de-sac but I do not suspect that this would be causing her any pain.  Discussed the expected menstrual bleeding pattern following the endometrial ablation.  She will see in the next several months how this helps her and if her bleeding pattern improves, but if  it does not and/or she develops more dysmenorrhea then she may need to consider hysterectomy next.  Encouraged her to take it easy the next week as she returns to work and gave her a lifting restriction  <20 lbs more week, otherwise she may return to normal activities without restriction and resume normal gynecologic care at this point.   Joseph Pierini MD 04/16/20

## 2020-04-20 ENCOUNTER — Encounter: Payer: Self-pay | Admitting: Neurology

## 2020-04-20 ENCOUNTER — Ambulatory Visit: Payer: 59 | Admitting: Neurology

## 2020-04-21 ENCOUNTER — Telehealth: Payer: Self-pay

## 2020-04-21 ENCOUNTER — Ambulatory Visit: Payer: 59 | Admitting: Obstetrics and Gynecology

## 2020-04-21 NOTE — Telephone Encounter (Signed)
Pt no showed 04/20/2020 appt with Dr. Rexene Alberts.

## 2020-05-18 ENCOUNTER — Other Ambulatory Visit: Payer: Self-pay | Admitting: Gastroenterology

## 2020-05-18 NOTE — Telephone Encounter (Signed)
Must have office visit prior to further refills.

## 2020-05-27 ENCOUNTER — Ambulatory Visit: Payer: 59 | Admitting: Obstetrics and Gynecology

## 2020-05-31 ENCOUNTER — Other Ambulatory Visit: Payer: Self-pay

## 2020-05-31 ENCOUNTER — Ambulatory Visit
Admission: EM | Admit: 2020-05-31 | Discharge: 2020-05-31 | Disposition: A | Discharge status: Home / Self Care | Payer: 59 | Attending: Emergency Medicine | Admitting: Emergency Medicine

## 2020-05-31 ENCOUNTER — Ambulatory Visit
Admission: EM | Admit: 2020-05-31 | Discharge: 2020-05-31 | Disposition: A | Discharge status: Home / Self Care | Payer: 59

## 2020-05-31 DIAGNOSIS — S76312A Strain of muscle, fascia and tendon of the posterior muscle group at thigh level, left thigh, initial encounter: Secondary | ICD-10-CM | POA: Diagnosis not present

## 2020-05-31 IMAGING — DX DG CHEST 2V
2 series · 2 of 2 positions shown · non-contrast
Comparison: 02/22/2018

CLINICAL DATA: Left chest wall pain.

EXAM:
CHEST - 2 VIEW

[chest pa]
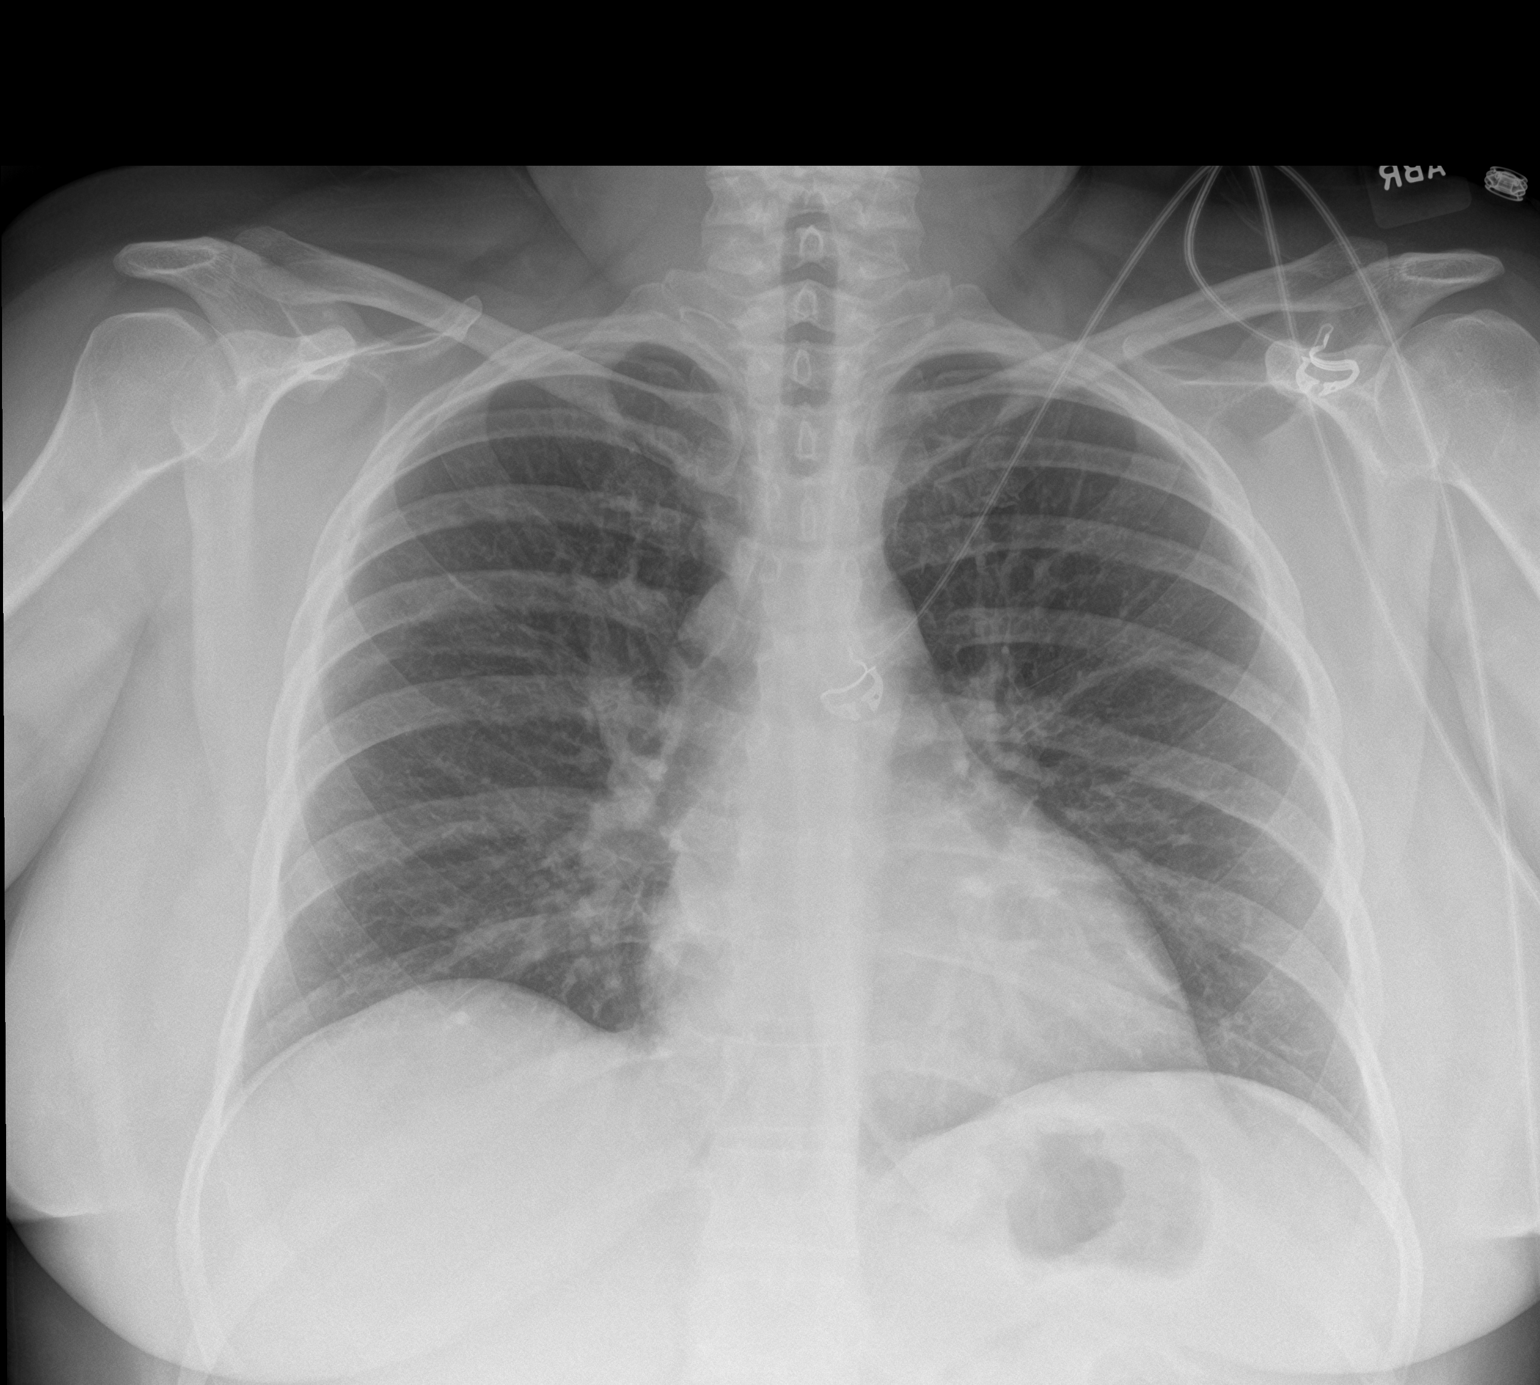

[chest lat]
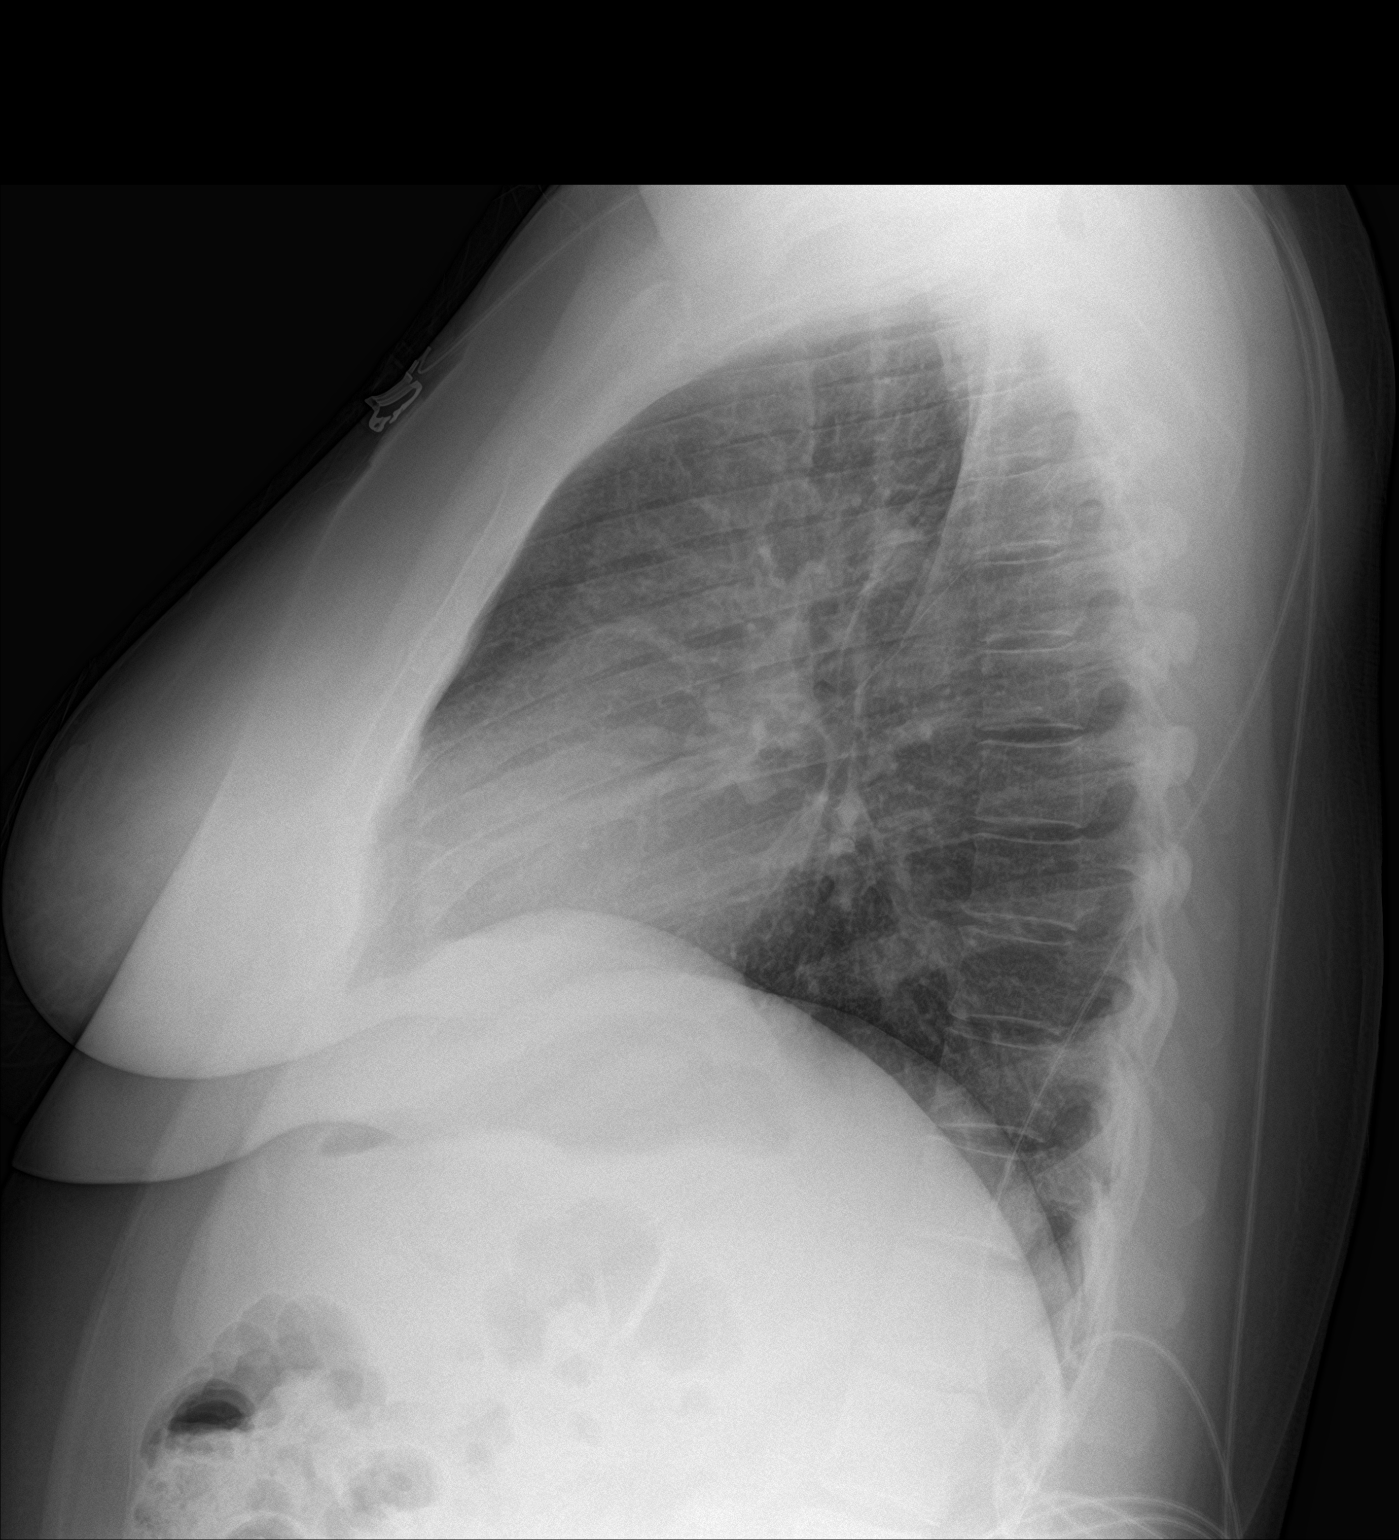

[2 of 2 positions shown; findings below may reference images not displayed]

FINDINGS: Heart and mediastinal contours are within normal limits. No focal
opacities or effusions. No acute bony abnormality.
IMPRESSION: No active cardiopulmonary disease.

## 2020-05-31 MED ORDER — MELOXICAM 15 MG PO TABS: 15.0000 mg | ORAL_TABLET | Freq: Every day | ORAL | 0 refills | Status: DC

## 2020-05-31 MED ORDER — KETOROLAC TROMETHAMINE 30 MG/ML IJ SOLN
30.0000 mg | Freq: Once | INTRAMUSCULAR | Status: AC
  Administered 2020-05-31: 30 mg via INTRAMUSCULAR

## 2020-05-31 MED ORDER — DEXAMETHASONE SODIUM PHOSPHATE 10 MG/ML IJ SOLN
10.0000 mg | Freq: Once | INTRAMUSCULAR | Status: AC
  Administered 2020-05-31: 10 mg via INTRAMUSCULAR

## 2020-05-31 NOTE — ED Provider Notes (Signed)
[place]   [id number] 05/31/20 Arrival Time: 1829  [id number]:[id number] PAIN  SUBJECTIVE: History from: patient. Theresa Sawyer is a 39 y.o. female complains of LT hamstring pain x 1 day.  Symptoms began after bending forward to pick object up.  Localizes the pain to the LT hamstring.  Describes the pain as intermittent and shooting in character.  Has tried OTC medications without relief.  Symptoms are made worse with sitting from a seated position and sitting on her bottom.  Denies similar symptoms in the past.  Complain of mild numbness/ tingling.  Denies fever, chills, erythema, ecchymosis, effusion, weakness.  ROS: As per HPI.  All other pertinent ROS negative.     Past Medical History:  Diagnosis Date  . Abnormal uterine bleeding (AUB)   . Anxiety   . Arthritis    oa  . Depression   . GERD (gastroesophageal reflux disease)   . History of chest pain (10-20-2019  pt denies any cardiac s&s)   mutliple ED visits, atypical chest pain, chest wall pain, muscularskeletal chest pain;  pt had cardiology evaulation dated 04-23-2018 note in epic , state atypical chest wall pain with negative d dimer and troponin's multiple times in epic, suggested CTA  (pt did not get done)  . Hyperlipidemia   . IDA (iron deficiency anemia)   . OSA on CPAP    followed by dr Dia Sitter  . Psoriasis   . Self-catheterizes urinary bladder    up to 10 x day   Past Surgical History:  Procedure Laterality Date  . DILATATION & CURETTAGE/HYSTEROSCOPY WITH MYOSURE N/A 10/24/2019   Procedure: DILATATION & CURETTAGE/HYSTEROSCOPY WITH MYOSURE;  Surgeon: Joseph Pierini, MD;  Location: [place];  Service: Gynecology;  Laterality: N/A;  . HYSTEROSCOPY WITH NOVASURE N/A 04/06/2020   Procedure: DILATION AND CURRETTAGE; NOVASURE ABLATION;  Surgeon: Joseph Pierini, MD;  Location: [place];  Service: Gynecology;  Laterality: N/A;  . LAPAROSCOPY N/A 04/06/2020   Procedure: LAPAROSCOPY  OPERATIVE; LYSIS OF ADHESION;  Surgeon: Joseph Pierini, MD;  Location: [place] [facility];  Service: Gynecology;  Laterality: N/A;  . TUBAL LIGATION Bilateral 02/05/2013   Procedure: POST PARTUM TUBAL LIGATION;  Surgeon: Woodroe Mode, MD;  Location: [facility];  Service: Gynecology;  Laterality: Bilateral;    Filshie clips   Allergies  Allergen Reactions  . Nsaids     Do not take per rheumatologist   No current facility-administered medications on file prior to encounter.   Current Outpatient Medications on File Prior to Encounter  Medication Sig Dispense Refill  . ALPRAZolam (XANAX) 0.5 MG tablet TAKE 1 TABLET BY MOUTH ONCE DAILY AS NEEDED FOR ANXIETY 30 tablet 1  . escitalopram (LEXAPRO) 20 MG tablet TAKE 1 TABLET(20 MG) BY MOUTH DAILY (Patient taking differently: at bedtime. ) 90 tablet 3  . famotidine (PEPCID) 40 MG tablet TAKE 1 TABLET(40 MG) BY MOUTH TWICE DAILY 60 tablet 3  . Multiple Vitamins-Minerals (MULTIVITAMIN WITH MINERALS) tablet Take 1 tablet by mouth daily.    Marland Kitchen oxyCODONE (ROXICODONE) 5 MG immediate release tablet Take 1 tablet (5 mg total) by mouth every 6 (six) hours as needed for moderate pain or breakthrough pain. (Patient not taking: Reported on 04/16/2020) 12 tablet 0  . triamcinolone cream (KENALOG) 0.1 % Apply 1 application topically as needed.      Social History   Socioeconomic History  . Marital status: Legally Separated    Spouse name: Not on file  . Number of children:  Not on file  . Years of education: Not on file  . Highest education level: Not on file  Occupational History  . Not on file  Tobacco Use  . Smoking status: Current Every Day Smoker    Packs/day: 0.50    Years: 20.00    Pack years: 10.00    Types: Cigarettes  . Smokeless tobacco: Never Used  Vaping Use  . Vaping Use: Never used  Substance and Sexual Activity  . Alcohol use: No  . Drug use: Never  . Sexual activity: Yes    Birth control/protection: Surgical  Other Topics  Concern  . Not on file  Social History Narrative  . Not on file   Social Determinants of Health   Financial Resource Strain:   . Difficulty of Paying Living Expenses: Not on file  Food Insecurity:   . Worried About Charity fundraiser in the Last Year: Not on file  . Ran Out of Food in the Last Year: Not on file  Transportation Needs:   . Lack of Transportation (Medical): Not on file  . Lack of Transportation (Non-Medical): Not on file  Physical Activity:   . Days of Exercise per Week: Not on file  . Minutes of Exercise per Session: Not on file  Stress:   . Feeling of Stress : Not on file  Social Connections:   . Frequency of Communication with Friends and Family: Not on file  . Frequency of Social Gatherings with Friends and Family: Not on file  . Attends Religious Services: Not on file  . Active Member of Clubs or Organizations: Not on file  . Attends Archivist Meetings: Not on file  . Marital Status: Not on file  Intimate Partner Violence:   . Fear of Current or Ex-Partner: Not on file  . Emotionally Abused: Not on file  . Physically Abused: Not on file  . Sexually Abused: Not on file   Family History  Problem Relation Age of Onset  . Diabetes Maternal Grandmother   . Diabetes Paternal Grandmother   . Anxiety disorder Mother   . Psoriasis Sister   . Heart attack Brother 34       deceased from this  . Anxiety disorder Brother   . Cirrhosis Maternal Grandfather        alcohol related  . Allergy (severe) Son   . Healthy Son   . Colon cancer Neg Hx   . Esophageal cancer Neg Hx   . Colon polyps Neg Hx   . Stomach cancer Neg Hx   . Pancreatic cancer Neg Hx     OBJECTIVE:  There were no vitals filed for this visit.  General appearance: ALERT; in no acute distress.  Head: NCAT Lungs: Normal respiratory effort Musculoskeletal: LT hamstring Inspection: Skin warm, dry, clear and intact without obvious erythema, effusion, or ecchymosis.  Palpation: TTP  over mid to distal posterior hamstring ROM: FROM active and passive Strength: 5/5 with hamstring flexion and extension Skin: warm and dry Neurologic: Ambulates without difficulty Psychological: alert and cooperative; normal mood and affect   ASSESSMENT & PLAN:  1. Strain of left hamstring muscle, initial encounter     Meds ordered this encounter  Medications  . meloxicam (MOBIC) 15 MG tablet    Sig: Take 1 tablet (15 mg total) by mouth daily.    Dispense:  30 tablet    Refill:  0    Order Specific Question:   Supervising Provider    Answer:  Blanchie Serve SUE [[id number]]  . ketorolac (TORADOL) 30 MG/ML injection 30 mg  . dexamethasone (DECADRON) injection 10 mg   Steroid and toradol shots given in office Continue conservative management of rest, ice, and gentle stretches Take mobic as needed for pain relief (may cause abdominal discomfort, ulcers, and GI bleeds avoid taking with other NSAIDs) Take cyclobenzaprine at nighttime for symptomatic relief. Avoid driving or operating heavy machinery while using medication. Follow up with PCP if symptoms persist Return or go to the ER if you have any new or worsening symptoms (fever, chills, chest pain, redness, swelling, bruising, etc...)   Reviewed expectations re: course of current medical issues. Questions answered. Outlined signs and symptoms indicating need for more acute intervention. Patient verbalized understanding. After Visit Summary given.    Lestine Box, PA-C 05/31/20 1740

## 2020-05-31 NOTE — ED Triage Notes (Signed)
Pt returned for leg pain from previous visit

## 2020-05-31 NOTE — Discharge Instructions (Signed)
Steroid and toradol shots given in office Continue conservative management of rest, ice, and gentle stretches Take mobic as needed for pain relief (may cause abdominal discomfort, ulcers, and GI bleeds avoid taking with other NSAIDs) Take cyclobenzaprine at nighttime for symptomatic relief. Avoid driving or operating heavy machinery while using medication. Follow up with PCP if symptoms persist Return or go to the ER if you have any new or worsening symptoms (fever, chills, chest pain, redness, swelling, bruising, etc...)

## 2020-05-31 NOTE — ED Triage Notes (Signed)
Pt presents with left hamstring pain after bending over to lift an object

## 2020-06-05 ENCOUNTER — Telehealth: Payer: Self-pay | Admitting: Emergency Medicine

## 2020-06-05 MED ORDER — CYCLOBENZAPRINE HCL 10 MG PO TABS: 10.0000 mg | ORAL_TABLET | Freq: Two times a day (BID) | ORAL | 0 refills | Status: DC | PRN

## 2020-06-16 ENCOUNTER — Ambulatory Visit: Payer: 59 | Admitting: Obstetrics and Gynecology

## 2020-06-24 ENCOUNTER — Ambulatory Visit: Payer: 59 | Admitting: Obstetrics and Gynecology

## 2020-07-20 ENCOUNTER — Other Ambulatory Visit: Payer: Self-pay

## 2020-07-20 ENCOUNTER — Encounter: Payer: Self-pay | Admitting: Family Medicine

## 2020-07-20 ENCOUNTER — Ambulatory Visit: Payer: 59 | Admitting: Family Medicine

## 2020-07-20 ENCOUNTER — Telehealth: Payer: 59 | Admitting: Emergency Medicine

## 2020-07-20 VITALS — BP 170/120 | HR 94 | Temp 98.2°F | Ht 65.0 in | Wt 246.0 lb

## 2020-07-20 DIAGNOSIS — L4050 Arthropathic psoriasis, unspecified: Secondary | ICD-10-CM

## 2020-07-20 DIAGNOSIS — G43009 Migraine without aura, not intractable, without status migrainosus: Secondary | ICD-10-CM | POA: Diagnosis not present

## 2020-07-20 DIAGNOSIS — I10 Essential (primary) hypertension: Secondary | ICD-10-CM | POA: Diagnosis not present

## 2020-07-20 DIAGNOSIS — R110 Nausea: Secondary | ICD-10-CM | POA: Diagnosis not present

## 2020-07-20 DIAGNOSIS — Z716 Tobacco abuse counseling: Secondary | ICD-10-CM

## 2020-07-20 DIAGNOSIS — Z6841 Body Mass Index (BMI) 40.0 and over, adult: Secondary | ICD-10-CM

## 2020-07-20 MED ORDER — RIZATRIPTAN BENZOATE 5 MG PO TABS: 5.0000 mg | ORAL_TABLET | ORAL | 0 refills | Status: DC | PRN

## 2020-07-20 MED ORDER — VALSARTAN 80 MG PO TABS: 80.0000 mg | ORAL_TABLET | Freq: Every day | ORAL | 3 refills | Status: DC

## 2020-07-20 MED ORDER — HYDROCHLOROTHIAZIDE 25 MG PO TABS: 25.0000 mg | ORAL_TABLET | Freq: Every day | ORAL | 3 refills | Status: DC

## 2020-07-20 MED ORDER — ONDANSETRON HCL 4 MG PO TABS: 4.0000 mg | ORAL_TABLET | Freq: Three times a day (TID) | ORAL | 0 refills | Status: DC | PRN

## 2020-07-20 NOTE — Progress Notes (Signed)
1/11/20223:11 PM  Theresa Sawyer [birth year], 40 y.o., female [id number]  Chief Complaint  Patient presents with  . Hypertension    Headaches x 1 month daily- tylenol not helping Patient reports vomiting and nausea yesterday     HPI:   Patient is a 40 y.o. female with past medical history significant for arthritis, HLD, anemia, HTN who presents today for HTN and migraines  Has not taken any BP medications Noticed increased headaches BP Readings from Last 3 Encounters:  07/20/20 (!) 170/120  05/31/20 (!) 169/103  04/16/20 134/84    Started [place] Nail for psoriasis Current smoker  Depression screen [facility] 2/9 07/20/2020 03/18/2020 11/13/2019  Decreased Interest 0 3 1  Down, Depressed, Hopeless 0 2 1  PHQ - 2 Score 0 5 2  Altered sleeping - 3 3  Tired, decreased energy - 3 3  Change in appetite - 1 3  Feeling bad or failure about yourself  - 1 3  Trouble concentrating - 2 3  Moving slowly or fidgety/restless - 1 3  Suicidal thoughts - 0 0  PHQ-9 Score - 16 20  Difficult doing work/chores - Somewhat difficult Very difficult  Some recent data might be hidden    Fall Risk  07/20/2020 04/02/2020 03/18/2020 10/02/2019 09/11/2019  Falls in the past year? 0 0 0 0 0  Number falls in past yr: 0 0 - - -  Injury with Fall? 0 0 - - -  Risk for fall due to : - No Fall Risks - - -  Follow up Falls evaluation completed Falls evaluation completed Falls evaluation completed Falls evaluation completed -     Allergies  Allergen Reactions  . Nsaids     Do not take per rheumatologist    Prior to Admission medications   Medication Sig Start Date End Date Taking? Authorizing Provider  ALPRAZolam Duanne Moron) 0.5 MG tablet TAKE 1 TABLET BY MOUTH ONCE DAILY AS NEEDED FOR ANXIETY 03/18/20   Sagardia, Ines Bloomer, MD  cyclobenzaprine (FLEXERIL) 10 MG tablet Take 1 tablet (10 mg total) by mouth 2 (two) times daily as needed for muscle spasms. 06/05/20   Scot Jun, FNP  escitalopram (LEXAPRO) 20 MG  tablet TAKE 1 TABLET(20 MG) BY MOUTH DAILY Patient taking differently: at bedtime.  02/16/20   Horald Pollen, MD  famotidine (PEPCID) 40 MG tablet TAKE 1 TABLET(40 MG) BY MOUTH TWICE DAILY 05/18/20   Thornton Park, MD  meloxicam (MOBIC) 15 MG tablet Take 1 tablet (15 mg total) by mouth daily. 05/31/20   Wurst, [place], PA-C  Multiple Vitamins-Minerals (MULTIVITAMIN WITH MINERALS) tablet Take 1 tablet by mouth daily.    [provider]  oxyCODONE (ROXICODONE) 5 MG immediate release tablet Take 1 tablet (5 mg total) by mouth every 6 (six) hours as needed for moderate pain or breakthrough pain. Patient not taking: Reported on 04/16/2020 04/06/20 04/06/21  Joseph Pierini, MD  triamcinolone cream (KENALOG) 0.1 % Apply 1 application topically as needed.     [provider]    Past Medical History:  Diagnosis Date  . Abnormal uterine bleeding (AUB)   . Anxiety   . Arthritis    oa  . Depression   . GERD (gastroesophageal reflux disease)   . History of chest pain (10-20-2019  pt denies any cardiac s&s)   mutliple ED visits, atypical chest pain, chest wall pain, muscularskeletal chest pain;  pt had cardiology evaulation dated 04-23-2018 note in epic , state atypical chest wall pain with negative  d dimer and troponin's multiple times in epic, suggested CTA  (pt did not get done)  . Hyperlipidemia   . IDA (iron deficiency anemia)   . OSA on CPAP    followed by dr Dia Sitter  . Psoriasis   . Self-catheterizes urinary bladder    up to 10 x day    Past Surgical History:  Procedure Laterality Date  . DILATATION & CURETTAGE/HYSTEROSCOPY WITH MYOSURE N/A 10/24/2019   Procedure: DILATATION & CURETTAGE/HYSTEROSCOPY WITH MYOSURE;  Surgeon: Joseph Pierini, MD;  Location: [place];  Service: Gynecology;  Laterality: N/A;  . HYSTEROSCOPY WITH NOVASURE N/A 04/06/2020   Procedure: DILATION AND CURRETTAGE; NOVASURE ABLATION;  Surgeon: Joseph Pierini, MD;  Location:  [place];  Service: Gynecology;  Laterality: N/A;  . LAPAROSCOPY N/A 04/06/2020   Procedure: LAPAROSCOPY OPERATIVE; LYSIS OF ADHESION;  Surgeon: Joseph Pierini, MD;  Location: [place];  Service: Gynecology;  Laterality: N/A;  . TUBAL LIGATION Bilateral 02/05/2013   Procedure: POST PARTUM TUBAL LIGATION;  Surgeon: Woodroe Mode, MD;  Location: [facility];  Service: Gynecology;  Laterality: Bilateral;    Filshie clips    Social History   Tobacco Use  . Smoking status: Current Every Day Smoker    Packs/day: 0.50    Years: 20.00    Pack years: 10.00    Types: Cigarettes  . Smokeless tobacco: Never Used  Substance Use Topics  . Alcohol use: No    Family History  Problem Relation Age of Onset  . Diabetes Maternal Grandmother   . Diabetes Paternal Grandmother   . Anxiety disorder Mother   . Psoriasis Sister   . Heart attack Brother 34       deceased from this  . Anxiety disorder Brother   . Cirrhosis Maternal Grandfather        alcohol related  . Allergy (severe) Son   . Healthy Son   . Colon cancer Neg Hx   . Esophageal cancer Neg Hx   . Colon polyps Neg Hx   . Stomach cancer Neg Hx   . Pancreatic cancer Neg Hx     Review of Systems  Constitutional: Negative for chills, fever and malaise/fatigue.  Eyes: Negative for blurred vision and double vision.  Respiratory: Negative for cough, shortness of breath and wheezing.   Cardiovascular: Negative for chest pain, palpitations and leg swelling.  Gastrointestinal: Positive for nausea. Negative for abdominal pain, blood in stool, constipation, diarrhea, heartburn and vomiting.  Genitourinary: Negative for dysuria, frequency and hematuria.  Musculoskeletal: Negative for back pain, joint pain, myalgias and neck pain.  Skin: Negative for rash.  Neurological: Positive for headaches. Negative for dizziness and weakness.     OBJECTIVE:  Today's Vitals   07/20/20 1449  BP: (!) 170/120  Pulse: 94   Temp: 98.2 F (36.8 C)  SpO2: 98%  Weight: 246 lb (111.6 kg)  Height: 5' 5"  (1.651 m)   Body mass index is 40.94 kg/m.   Physical Exam Constitutional:      General: She is not in acute distress.    Appearance: Normal appearance. She is not ill-appearing.  HENT:     Head: Normocephalic.  Cardiovascular:     Rate and Rhythm: Normal rate and regular rhythm.     Pulses: Normal pulses.     Heart sounds: Normal heart sounds. No murmur heard. No friction rub. No gallop.   Pulmonary:     Effort: Pulmonary effort is normal. No respiratory distress.  Breath sounds: Normal breath sounds. No stridor. No wheezing, rhonchi or rales.  Abdominal:     General: Bowel sounds are normal.     Palpations: Abdomen is soft.     Tenderness: There is no abdominal tenderness.  Musculoskeletal:     Right lower leg: No edema.     Left lower leg: No edema.  Skin:    General: Skin is warm and dry.  Neurological:     Mental Status: She is alert and oriented to person, place, and time.  Psychiatric:        Mood and Affect: Mood normal.        Behavior: Behavior normal.     No results found for this or any previous visit (from the past 24 hour(s)).  No results found.   ASSESSMENT and PLAN  Problem List Items Addressed This Visit      Musculoskeletal and Integument   Psoriatic arthritis ([place])     Other   BMI 40.0-44.9, adult ([place])    Other Visit Diagnoses    Essential hypertension    -  Primary   Relevant Medications   hydrochlorothiazide (HYDRODIURIL) 25 MG tablet   valsartan (DIOVAN) 80 MG tablet   Migraine without aura and without status migrainosus, not intractable       Relevant Medications   hydrochlorothiazide (HYDRODIURIL) 25 MG tablet   valsartan (DIOVAN) 80 MG tablet   rizatriptan (MAXALT) 5 MG tablet   ondansetron (ZOFRAN) 4 MG tablet   Nausea without vomiting       Relevant Orders   COVID-19, Flu A+B and RSV   Encounter for smoking cessation counseling          Plan  Start Valsartan and HCTZ daily for BP  Encouraged to get home BP cuff  Take zofran as needed for nausea  Take Maxalt for migraines  Educated on s/s of Stroke and to immediately go to ED if this occurs.  RTC precautions provided  R/se/b of medications discussed  Return in about 1 week (around 07/27/2020).    Huston Foley Sedona Wenk, FNP-BC [facility] [place], [place] [address] Ph.  [phone] Fax [phone]

## 2020-07-20 NOTE — Patient Instructions (Signed)

## 2020-07-23 LAB — COVID-19, FLU A+B AND RSV
Influenza A, NAA: NOT DETECTED
Influenza B, NAA: NOT DETECTED
RSV, NAA: NOT DETECTED
SARS-CoV-2, NAA: NOT DETECTED

## 2020-07-27 ENCOUNTER — Ambulatory Visit: Payer: 59 | Admitting: Family Medicine

## 2020-07-28 ENCOUNTER — Other Ambulatory Visit: Payer: Self-pay

## 2020-07-28 ENCOUNTER — Encounter: Payer: Self-pay | Admitting: Obstetrics and Gynecology

## 2020-07-28 ENCOUNTER — Ambulatory Visit (INDEPENDENT_AMBULATORY_CARE_PROVIDER_SITE_OTHER): Payer: 59 | Admitting: Obstetrics and Gynecology

## 2020-07-28 ENCOUNTER — Ambulatory Visit: Payer: 59 | Admitting: Family Medicine

## 2020-07-28 ENCOUNTER — Encounter: Payer: Self-pay | Admitting: Family Medicine

## 2020-07-28 VITALS — BP 135/85 | HR 121 | Temp 98.6°F | Ht 65.0 in | Wt 247.0 lb

## 2020-07-28 VITALS — BP 120/78 | Ht 65.0 in | Wt 246.0 lb

## 2020-07-28 DIAGNOSIS — Z124 Encounter for screening for malignant neoplasm of cervix: Secondary | ICD-10-CM | POA: Diagnosis not present

## 2020-07-28 DIAGNOSIS — R87610 Atypical squamous cells of undetermined significance on cytologic smear of cervix (ASC-US): Secondary | ICD-10-CM | POA: Diagnosis not present

## 2020-07-28 DIAGNOSIS — G43009 Migraine without aura, not intractable, without status migrainosus: Secondary | ICD-10-CM | POA: Diagnosis not present

## 2020-07-28 DIAGNOSIS — L4050 Arthropathic psoriasis, unspecified: Secondary | ICD-10-CM

## 2020-07-28 DIAGNOSIS — I10 Essential (primary) hypertension: Secondary | ICD-10-CM

## 2020-07-28 DIAGNOSIS — R7303 Prediabetes: Secondary | ICD-10-CM

## 2020-07-28 DIAGNOSIS — R102 Pelvic and perineal pain: Secondary | ICD-10-CM | POA: Diagnosis not present

## 2020-07-28 DIAGNOSIS — Z01419 Encounter for gynecological examination (general) (routine) without abnormal findings: Secondary | ICD-10-CM | POA: Diagnosis not present

## 2020-07-28 DIAGNOSIS — N939 Abnormal uterine and vaginal bleeding, unspecified: Secondary | ICD-10-CM | POA: Diagnosis not present

## 2020-07-28 DIAGNOSIS — Z6841 Body Mass Index (BMI) 40.0 and over, adult: Secondary | ICD-10-CM | POA: Diagnosis not present

## 2020-07-28 DIAGNOSIS — R5383 Other fatigue: Secondary | ICD-10-CM

## 2020-07-28 MED ORDER — VALSARTAN 80 MG PO TABS
120.0000 mg | ORAL_TABLET | Freq: Every day | ORAL | 3 refills | Status: DC
Start: 2020-07-28 — End: 2021-06-10

## 2020-07-28 MED ORDER — SUMATRIPTAN SUCCINATE 50 MG PO TABS: 50.0000 mg | ORAL_TABLET | Freq: Every day | ORAL | 0 refills | Status: DC | PRN

## 2020-07-28 NOTE — Patient Instructions (Addendum)
. Increase valsaratan to 120 mg daily for BP goal< 130/80 . Continue home BP monitoring . Try Sumatriptan for migraines . Continue zofran as needed    Migraine Headache A migraine headache is a very strong throbbing pain on one side or both sides of your head. This type of headache can also cause other symptoms. It can last from 4 hours to 3 days. Talk with your doctor about what things may bring on (trigger) this condition. What are the causes? The exact cause of this condition is not known. This condition may be triggered or caused by:  Drinking alcohol.  Smoking.  Taking medicines, such as: ? Medicine used to treat chest pain (nitroglycerin). ? Birth control pills. ? Estrogen. ? Some blood pressure medicines.  Eating or drinking certain products.  Doing physical activity. Other things that may trigger a migraine headache include:  Having a menstrual period.  Pregnancy.  Hunger.  Stress.  Not getting enough sleep or getting too much sleep.  Weather changes.  Tiredness (fatigue). What increases the risk?  Being 83-67 years old.  Being female.  Having a family history of migraine headaches.  Being Caucasian.  Having depression or anxiety.  Being very overweight. What are the signs or symptoms?  A throbbing pain. This pain may: ? Happen in any area of the head, such as on one side or both sides. ? Make it hard to do daily activities. ? Get worse with physical activity. ? Get worse around bright lights or loud noises.  Other symptoms may include: ? Feeling sick to your stomach (nauseous). ? Vomiting. ? Dizziness. ? Being sensitive to bright lights, loud noises, or smells.  Before you get a migraine headache, you may get warning signs (an aura). An aura may include: ? Seeing flashing lights or having blind spots. ? Seeing bright spots, halos, or zigzag lines. ? Having tunnel vision or blurred vision. ? Having numbness or a tingling  feeling. ? Having trouble talking. ? Having weak muscles.  Some people have symptoms after a migraine headache (postdromal phase), such as: ? Tiredness. ? Trouble thinking (concentrating). How is this treated?  Taking medicines that: ? Relieve pain. ? Relieve the feeling of being sick to your stomach. ? Prevent migraine headaches.  Treatment may also include: ? Having acupuncture. ? Avoiding foods that bring on migraine headaches. ? Learning ways to control your body functions (biofeedback). ? Therapy to help you know and deal with negative thoughts (cognitive behavioral therapy). Follow these instructions at home: Medicines  Take over-the-counter and prescription medicines only as told by your doctor.  Ask your doctor if the medicine prescribed to you: ? Requires you to avoid driving or using heavy machinery. ? Can cause trouble pooping (constipation). You may need to take these steps to prevent or treat trouble pooping:  Drink enough fluid to keep your pee (urine) pale yellow.  Take over-the-counter or prescription medicines.  Eat foods that are high in fiber. These include beans, whole grains, and fresh fruits and vegetables.  Limit foods that are high in fat and sugar. These include fried or sweet foods. Lifestyle  Do not drink alcohol.  Do not use any products that contain nicotine or tobacco, such as cigarettes, e-cigarettes, and chewing tobacco. If you need help quitting, ask your doctor.  Get at least 8 hours of sleep every night.  Limit and deal with stress. General instructions  Keep a journal to find out what may bring on your migraine headaches. For example,  write down: ? What you eat and drink. ? How much sleep you get. ? Any change in what you eat or drink. ? Any change in your medicines.  If you have a migraine headache: ? Avoid things that make your symptoms worse, such as bright lights. ? It may help to lie down in a dark, quiet room. ? Do not  drive or use heavy machinery. ? Ask your doctor what activities are safe for you.  Keep all follow-up visits as told by your doctor. This is important.      Contact a doctor if:  You get a migraine headache that is different or worse than others you have had.  You have more than 15 headache days in one month. Get help right away if:  Your migraine headache gets very bad.  Your migraine headache lasts longer than 72 hours.  You have a fever.  You have a stiff neck.  You have trouble seeing.  Your muscles feel weak or like you cannot control them.  You start to lose your balance a lot.  You start to have trouble walking.  You pass out (faint).  You have a seizure. Summary  A migraine headache is a very strong throbbing pain on one side or both sides of your head. These headaches can also cause other symptoms.  This condition may be treated with medicines and changes to your lifestyle.  Keep a journal to find out what may bring on your migraine headaches.  Contact a doctor if you get a migraine headache that is different or worse than others you have had.  Contact your doctor if you have more than 15 headache days in a month. This information is not intended to replace advice given to you by your health care provider. Make sure you discuss any questions you have with your health care provider. Document Revised: 10/18/2018 Document Reviewed: 08/08/2018 Elsevier Patient Education  2021 [employer].  If you have lab work done today you will be contacted with your lab results within the next 2 weeks.  If you have not heard from [place] then please contact us. The fastest way to get your results is to register for My Chart.   IF you received an x-ray today, you will receive an invoice from [facility]. Please contact [facility] at [phone] with questions or concerns regarding your invoice.   IF you received labwork today, you will receive an invoice  from [facility]. Please contact [facility] at [phone] with questions or concerns regarding your invoice.   Our billing staff will not be able to assist you with questions regarding bills from these companies.  You will be contacted with the lab results as soon as they are available. The fastest way to get your results is to activate your My Chart account. Instructions are located on the last page of this paperwork. If you have not heard from [place] regarding the results in 2 weeks, please contact this office.

## 2020-07-28 NOTE — Progress Notes (Signed)
Theresa Sawyer 1980-11-27 [id number]  SUBJECTIVE:  40 y.o. [id number] female for annual routine gynecologic exam and Pap smear. She had a NovaSure endometrial ablation 04/06/2020 to address abnormal uterine bleeding with heavy periods.  D&C pathology was benign.  Due to her pelvic pain and dyspareunia, also performed operative laparoscopy with lysis of a single stringy adhesion from her abdominal wall to her right adnexa, but there was no definitive evidence of endometriosis.  Her uterus appeared globular shaped so we suspected adenomyosis.  Bleeding greatly improved the first few months but then with stressors over the holidays including loss of her in-law, she has started to notice an irregular bleeding pattern again.  This has been getting progressively worse.  She sometimes will have some bleeding 1 week and then increased heavy bleeding the next week sometimes with only 1 to 2 weeks without bleeding per month.  Recently started on HCTZ last week by her primary doctor for hypertension management. Noting intermittent urinary frequency starting before the blood pressure medication change. Still smoking but working on quitting.  Current Outpatient Medications  Medication Sig Dispense Refill  . ALPRAZolam (XANAX) 0.5 MG tablet TAKE 1 TABLET BY MOUTH ONCE DAILY AS NEEDED FOR ANXIETY 30 tablet 1  . escitalopram (LEXAPRO) 20 MG tablet TAKE 1 TABLET(20 MG) BY MOUTH DAILY (Patient taking differently: at bedtime.) 90 tablet 3  . famotidine (PEPCID) 40 MG tablet TAKE 1 TABLET(40 MG) BY MOUTH TWICE DAILY 60 tablet 3  . hydrochlorothiazide (HYDRODIURIL) 25 MG tablet Take 1 tablet (25 mg total) by mouth daily. 90 tablet 3  . ondansetron (ZOFRAN) 4 MG tablet Take 1 tablet (4 mg total) by mouth every 8 (eight) hours as needed for nausea or vomiting. 20 tablet 0  . triamcinolone cream (KENALOG) 0.1 % Apply 1 application topically as needed.     . valsartan (DIOVAN) 80 MG tablet Take 1 tablet (80 mg total) by mouth  daily. 90 tablet 3   No current facility-administered medications for this visit.   Allergies: Nsaids  Patient's last menstrual period was 07/02/2020.  Past medical history,surgical history, problem list, medications, allergies, family history and social history were all reviewed and documented as reviewed in the EPIC chart.  ROS: Positives and negatives as reviewed in HPI  OBJECTIVE:  BP 120/78 (BP Location: Right Arm, Patient Position: Sitting, Cuff Size: Large)   Ht 5' 5"  (1.651 m)   Wt 246 lb (111.6 kg)   LMP 07/02/2020   BMI 40.94 kg/m  The patient appears well, alert, oriented, in no distress.  BREAST EXAM: breasts appear normal, no suspicious masses, no skin or nipple changes or axillary nodes  PELVIC EXAM: VULVA: normal appearing vulva with no masses, tenderness or lesions, VAGINA: normal appearing vagina with normal color and discharge, no lesions, CERVIX: normal appearing cervix without discharge or lesions, UTERUS: uterus is difficult to palpate but outpatient of the lower abdominal wall/pelvis elicits discomfort, ADNEXA: normal adnexa in size, nontender and no masses, PAP: Pap smear done today, thin-prep method  Chaperone: Wandra Scot Bonham present during the examination  ASSESSMENT:  40 y.o. [id number] here for annual gynecologic exam  PLAN:   1. Abnormal uterine bleeding pattern s/p endometrial ablation, chronic pelvic pain with no findings of endometriosis on laparoscopy.  She may have adenomyosis, however.  We discussed conservative treatment options including estrogen-free hormonal contraceptives to see if this helps with her symptoms.  We could try norethindrone or Depo-Provera.  She would like to just self-manage symptoms right now  but she is thinking that she would like to have definitive management for the abnormal uterine bleeding with a hysterectomy eventually.  She has had a previous tubal ligation and is not planning on further childbearing.  We briefly discussed  that hysterectomy would take care of her menstrual bleeding problems and also may partly address her pelvic pain, but may not necessarily improve the dyspareunia or other pelvic pain symptoms.  We also discussed that in order to prepare for that I want her to work on quitting smoking and losing weight and making sure her other medical conditions and medications are in order before proceeding with more serious discussion regarding hysterectomy.  The patient is aware that I will only be at this practice until early March 2022 so she knows to make sure she requests follow-up with one of our other physicians when I am no longer at the practice. 2. Pap smear 07/2019 ASCUS/negative high risk HPV.  Per protocol, we did repeat the Pap smear today. 3. Contraception.  Prior tubal ligation. 4. Breast exam normal.  She has not yet done a mammogram and I encouraged her to get this scheduled.  Names and contacts available upon request at checkout. 5. Health maintenance.  No labs today as she has these completed with her primary care doctor.  Normotensive today and being managed by her primary doctor for hypertension and other chronic medical conditions.  Additional time was spent in discussion of her gynecologic symptoms above and beyond the routine gynecologic exam.  Return annually or sooner, prn.  Joseph Pierini MD 07/28/20

## 2020-07-28 NOTE — Progress Notes (Signed)
1/19/20223:22 PM  Theresa Sawyer 10-25-1980, 40 y.o., female [id number]  Chief Complaint  Patient presents with  . Hypertension    Follow up - patient has undergone gyn medical procedure today and has some abd and back pain   . Headache    Patient has not been able to pick up maxalt due to cost $300     HPI:   Patient is a 40 y.o. female with past medical history significant for arthritis, HLD, anemia, HTN who presents today for HTN and migraines.  Recently had a pap this morning, still having pain from that procedure  HTN Valsartan and HCTZ daily Home BP: 136-150/80's Continues to work on LFM BP Readings from Last 3 Encounters:  07/28/20 135/85  07/28/20 120/78  07/20/20 (!) 170/120   Migraines Maxalt never picked up due to cost Has been using zofran as needed for nausea Have improved throughout the week Frequently fatigued  Depression screen [facility] 2/9 07/28/2020 07/20/2020 03/18/2020  Decreased Interest 0 0 3  Down, Depressed, Hopeless 0 0 2  PHQ - 2 Score 0 0 5  Altered sleeping 0 - 3  Tired, decreased energy 0 - 3  Change in appetite 0 - 1  Feeling bad or failure about yourself  0 - 1  Trouble concentrating 0 - 2  Moving slowly or fidgety/restless 0 - 1  Suicidal thoughts 0 - 0  PHQ-9 Score 0 - 16  Difficult doing work/chores Not difficult at all - Somewhat difficult  Some recent data might be hidden    Fall Risk  07/28/2020 07/20/2020 04/02/2020 03/18/2020 10/02/2019  Falls in the past year? 0 0 0 0 0  Number falls in past yr: 0 0 0 - -  Injury with Fall? 0 0 0 - -  Risk for fall due to : - - No Fall Risks - -  Follow up _0      Allergies  Allergen Reactions  . Nsaids     Do not take per rheumatologist    Prior to Admission medications   Medication Sig Start Date End Date Taking? Authorizing Provider  ALPRAZolam (XANAX) 0.5 MG  tablet TAKE 1 TABLET BY MOUTH ONCE DAILY AS NEEDED FOR ANXIETY 03/18/20  Yes Sagardia, Ines Bloomer, MD  escitalopram (LEXAPRO) 20 MG tablet TAKE 1 TABLET(20 MG) BY MOUTH DAILY Patient taking differently: at bedtime. 02/16/20  Yes Sagardia, Ines Bloomer, MD  famotidine (PEPCID) 40 MG tablet TAKE 1 TABLET(40 MG) BY MOUTH TWICE DAILY 05/18/20  Yes Thornton Park, MD  hydrochlorothiazide (HYDRODIURIL) 25 MG tablet Take 1 tablet (25 mg total) by mouth daily. 07/20/20  Yes Ott Zimmerle, Laurita Quint, FNP  ondansetron (ZOFRAN) 4 MG tablet Take 1 tablet (4 mg total) by mouth every 8 (eight) hours as needed for nausea or vomiting. 07/20/20  Yes Elfida Shimada, Laurita Quint, FNP  triamcinolone cream (KENALOG) 0.1 % Apply 1 application topically as needed.    Yes [provider]  valsartan (DIOVAN) 80 MG tablet Take 1 tablet (80 mg total) by mouth daily. 07/20/20  Yes Nera Haworth, Laurita Quint, FNP    Past Medical History:  Diagnosis Date  . Abnormal uterine bleeding (AUB)   . Anxiety   . Arthritis    oa  . Depression   . GERD (gastroesophageal reflux disease)   . History of chest pain (10-20-2019  pt denies any cardiac s&s)   mutliple ED visits, atypical chest pain, chest  wall pain, muscularskeletal chest pain;  pt had cardiology evaulation dated 04-23-2018 note in epic , state atypical chest wall pain with negative d dimer and troponin's multiple times in epic, suggested CTA  (pt did not get done)  . Hyperlipidemia   . IDA (iron deficiency anemia)   . OSA on CPAP    followed by dr Dia Sitter  . Psoriasis   . Self-catheterizes urinary bladder    up to 10 x day    Past Surgical History:  Procedure Laterality Date  . DILATATION & CURETTAGE/HYSTEROSCOPY WITH MYOSURE N/A 10/24/2019   Procedure: DILATATION & CURETTAGE/HYSTEROSCOPY WITH MYOSURE;  Surgeon: Joseph Pierini, MD;  Location: [facility];  Service: Gynecology;  Laterality: N/A;  . HYSTEROSCOPY WITH NOVASURE N/A 04/06/2020   Procedure: DILATION AND CURRETTAGE;  NOVASURE ABLATION;  Surgeon: Joseph Pierini, MD;  Location: [facility];  Service: Gynecology;  Laterality: N/A;  . LAPAROSCOPY N/A 04/06/2020   Procedure: LAPAROSCOPY OPERATIVE; LYSIS OF ADHESION;  Surgeon: Joseph Pierini, MD;  Location: [facility];  Service: Gynecology;  Laterality: N/A;  . TUBAL LIGATION Bilateral 02/05/2013   Procedure: POST PARTUM TUBAL LIGATION;  Surgeon: Woodroe Mode, MD;  Location: [facility];  Service: Gynecology;  Laterality: Bilateral;    Filshie clips    Social History   Tobacco Use  . Smoking status: Current Every Day Smoker    Packs/day: 0.50    Years: 20.00    Pack years: 10.00    Types: Cigarettes  . Smokeless tobacco: Never Used  Substance Use Topics  . Alcohol use: No    Family History  Problem Relation Age of Onset  . Diabetes Maternal Grandmother   . Diabetes Paternal Grandmother   . Anxiety disorder Mother   . Psoriasis Sister   . Heart attack Brother 34       deceased from this  . Anxiety disorder Brother   . Cirrhosis Maternal Grandfather        alcohol related  . Allergy (severe) Son   . Healthy Son   . Colon cancer Neg Hx   . Esophageal cancer Neg Hx   . Colon polyps Neg Hx   . Stomach cancer Neg Hx   . Pancreatic cancer Neg Hx     Review of Systems  Constitutional: Positive for malaise/fatigue. Negative for chills and fever.  Eyes: Negative for blurred vision and double vision.  Respiratory: Negative for cough, shortness of breath and wheezing.   Cardiovascular: Negative for chest pain, palpitations and leg swelling.  Gastrointestinal: Negative for abdominal pain, blood in stool, constipation, diarrhea, heartburn, nausea and vomiting.  Genitourinary: Negative for dysuria, frequency and hematuria.  Musculoskeletal: Negative for back pain and joint pain.  Skin: Negative for rash.  Neurological: Negative for dizziness, weakness and headaches.     OBJECTIVE:  Today's Vitals   07/28/20 1419   BP: 135/85  Pulse: (!) 121  Temp: 98.6 F (37 C)  SpO2: 97%  Weight: 247 lb (112 kg)  Height: 5' 5"  (1.651 m)   Body mass index is 41.1 kg/m.   Physical Exam Constitutional:      General: She is not in acute distress.    Appearance: Normal appearance. She is not ill-appearing.  HENT:     Head: Normocephalic.  Cardiovascular:     Rate and Rhythm: Normal rate and regular rhythm.     Pulses: Normal pulses.     Heart sounds: Normal heart sounds. No murmur heard. No friction rub. No  gallop.   Pulmonary:     Effort: Pulmonary effort is normal. No respiratory distress.     Breath sounds: Normal breath sounds. No stridor. No wheezing, rhonchi or rales.  Abdominal:     General: Bowel sounds are normal.     Palpations: Abdomen is soft.     Tenderness: There is no abdominal tenderness.  Musculoskeletal:     Right lower leg: No edema.     Left lower leg: No edema.  Skin:    General: Skin is warm and dry.  Neurological:     Mental Status: She is alert and oriented to person, place, and time.  Psychiatric:        Mood and Affect: Mood normal.        Behavior: Behavior normal.     No results found for this or any previous visit (from the past 24 hour(s)).  No results found.   ASSESSMENT and PLAN  Problem List Items Addressed This Visit      Musculoskeletal and Integument   Psoriatic arthritis ([place])    Other Visit Diagnoses    Migraine without aura and without status migrainosus, not intractable    -  Primary   Relevant Medications   valsartan (DIOVAN) 80 MG tablet   SUMAtriptan (IMITREX) 50 MG tablet   Essential hypertension       Relevant Medications   valsartan (DIOVAN) 80 MG tablet   Other Relevant Orders   CMP14+EGFR   Body mass index (BMI) of 40.1-44.9 in adult [facility])   (Chronic)     Other fatigue       Relevant Orders   TSH   Vitamin D, 25-hydroxy   Pre-diabetes       Relevant Orders   Hemoglobin A1c      Plan . Increase valsartan to 120 mg daily  for BP goal< 130/80 . Continue home BP monitoring and LFM . Try Sumatriptan for migraines, follow up if not covered by insurance . Continue zofran as needed  . Will follow up with lab results   Return in about 3 months (around 10/26/2020).  Huston Foley Thom Ollinger, FNP-BC Primary Care at [place], [place] [address] Ph.  [phone] Fax [phone]

## 2020-07-29 ENCOUNTER — Other Ambulatory Visit: Payer: Self-pay | Admitting: Family Medicine

## 2020-07-29 DIAGNOSIS — E559 Vitamin D deficiency, unspecified: Secondary | ICD-10-CM

## 2020-07-29 LAB — CMP14+EGFR
ALT: 49 IU/L — ABNORMAL HIGH (ref 0–32)
AST: 30 IU/L (ref 0–40)
Albumin/Globulin Ratio: 1.8 (ref 1.2–2.2)
Albumin: 4.6 g/dL (ref 3.8–4.8)
Alkaline Phosphatase: 116 IU/L (ref 44–121)
BUN/Creatinine Ratio: 14 (ref 9–23)
BUN: 10 mg/dL (ref 6–24)
Bilirubin Total: 0.7 mg/dL (ref 0.0–1.2)
CO2: 23 mmol/L (ref 20–29)
Calcium: 10.1 mg/dL (ref 8.7–10.2)
Chloride: 97 mmol/L (ref 96–106)
Creatinine, Ser: 0.71 mg/dL (ref 0.57–1.00)
GFR calc Af Amer: 122 mL/min/{1.73_m2} (ref >59)
GFR calc non Af Amer: 106 mL/min/{1.73_m2} (ref >59)
Globulin, Total: 2.6 g/dL (ref 1.5–4.5)
Glucose: 110 mg/dL — ABNORMAL HIGH (ref 65–99)
Potassium: 4.3 mmol/L (ref 3.5–5.2)
Sodium: 137 mmol/L (ref 134–144)
Total Protein: 7.2 g/dL (ref 6.0–8.5)

## 2020-07-29 LAB — HEMOGLOBIN A1C
Est. average glucose Bld gHb Est-mCnc: 114 mg/dL
Hgb A1c MFr Bld: 5.6 % (ref 4.8–5.6)

## 2020-07-29 LAB — TSH: TSH: 0.892 u[IU]/mL (ref 0.450–4.500)

## 2020-07-29 LAB — VITAMIN D 25 HYDROXY (VIT D DEFICIENCY, FRACTURES): Vit D, 25-Hydroxy: 11.5 ng/mL — ABNORMAL LOW (ref 30.0–100.0)

## 2020-07-29 MED ORDER — VITAMIN D (ERGOCALCIFEROL) 1.25 MG (50000 UNIT) PO CAPS: 50000.0000 [IU] | ORAL_CAPSULE | ORAL | 6 refills | Status: DC

## 2020-07-30 LAB — PAP IG W/ RFLX HPV ASCU

## 2020-08-04 ENCOUNTER — Encounter: Payer: Self-pay | Admitting: Family Medicine

## 2020-08-04 ENCOUNTER — Ambulatory Visit: Payer: 59 | Admitting: Family Medicine

## 2020-08-04 ENCOUNTER — Other Ambulatory Visit: Payer: Self-pay

## 2020-08-04 VITALS — BP 124/82 | HR 80 | Temp 97.9°F | Ht 65.0 in | Wt 247.0 lb

## 2020-08-04 DIAGNOSIS — Z6841 Body Mass Index (BMI) 40.0 and over, adult: Secondary | ICD-10-CM | POA: Diagnosis not present

## 2020-08-04 DIAGNOSIS — I10 Essential (primary) hypertension: Secondary | ICD-10-CM | POA: Diagnosis not present

## 2020-08-04 DIAGNOSIS — L4050 Arthropathic psoriasis, unspecified: Secondary | ICD-10-CM | POA: Diagnosis not present

## 2020-08-04 DIAGNOSIS — M79672 Pain in left foot: Secondary | ICD-10-CM

## 2020-08-04 DIAGNOSIS — G43009 Migraine without aura, not intractable, without status migrainosus: Secondary | ICD-10-CM

## 2020-08-04 DIAGNOSIS — E559 Vitamin D deficiency, unspecified: Secondary | ICD-10-CM | POA: Insufficient documentation

## 2020-08-04 DIAGNOSIS — Z9989 Dependence on other enabling machines and devices: Secondary | ICD-10-CM

## 2020-08-04 DIAGNOSIS — K219 Gastro-esophageal reflux disease without esophagitis: Secondary | ICD-10-CM | POA: Insufficient documentation

## 2020-08-04 DIAGNOSIS — G4733 Obstructive sleep apnea (adult) (pediatric): Secondary | ICD-10-CM

## 2020-08-04 NOTE — Progress Notes (Signed)
1/26/202210:49 AM  Theresa Sawyer 1981/01/25, 40 y.o., female [id number]  Chief Complaint  Patient presents with  . Hypertension    Follow up , taking prescribed medications, working on diet   . Claudication    Sharp pain in foot , on and off for a couple of weeks , worse last 3 days - works on her feet all day     HPI:   Patient is a 40 y.o. female with past medical history significant for HTN, migraines and fatigue who presents today for HTN follow up.   HTN HCTZ 58m Valsartan 1262mdaily BP at goal< 130/80 1/11 BP  170/102 Working on LFM and improving diet  BP Readings from Last 3 Encounters:  08/04/20 124/82  07/28/20 135/85  07/28/20 120/78   Migraines Sumatriptan and zofran Have improved a little bit daily Takes sumatripan as needed and this works well Continues to have some neck pain  GERD Famotidine daily  Vitamin D deficiency Accidentally took vitamin D daily instead of once a week. Last vitamin D Lab Results  Component Value Date   VD25OH 11.5 (L) 07/28/2020    Wears CPAP nightly  Left foot pain Has been going on for awhile On feet a lot at work In the past had xrays: showed OA and heel spurs Attempts to wear shoes with good support Never has seen a podiatrist    Anxiety feels well controlled on Lexapro and Xanax  [facility] 08/04/2020 07/28/2020 07/20/2020  Decreased Interest 0 0 0  Down, Depressed, Hopeless 0 0 0  PHQ - 2 Score 0 0 0  Altered sleeping - 0 -  Tired, decreased energy - 0 -  Change in appetite - 0 -  Feeling bad or failure about yourself  - 0 -  Trouble concentrating - 0 -  Moving slowly or fidgety/restless - 0 -  Suicidal thoughts - 0 -  PHQ-9 Score - 0 -  Difficult doing work/chores - Not difficult at all -  Some recent data might be hidden    Fall Risk  08/04/2020 07/28/2020 07/20/2020 04/02/2020 03/18/2020  Falls in the past year? 0 0 0 0 0  Number falls in past yr: 0 0 0 0 -  Injury with Fall? 0 0 0  0 -  Risk for fall due to : - - - No Fall Risks -  Follow up Falls evaluation completed Falls evaluation completed Falls evaluation completed Falls evaluation completed Falls evaluation completed     Allergies  Allergen Reactions  . Nsaids     Do not take per rheumatologist    Prior to Admission medications   Medication Sig Start Date End Date Taking? Authorizing Provider  ALPRAZolam (XANAX) 0.5 MG tablet TAKE 1 TABLET BY MOUTH ONCE DAILY AS NEEDED FOR ANXIETY 03/18/20  Yes Sagardia, MiInes BloomerMD  escitalopram (LEXAPRO) 20 MG tablet TAKE 1 TABLET(20 MG) BY MOUTH DAILY Patient taking differently: at bedtime. 02/16/20  Yes Sagardia, MiInes BloomerMD  famotidine (PEPCID) 40 MG tablet TAKE 1 TABLET(40 MG) BY MOUTH TWICE DAILY 05/18/20  Yes BeThornton ParkMD  hydrochlorothiazide (HYDRODIURIL) 25 MG tablet Take 1 tablet (25 mg total) by mouth daily. 07/20/20  Yes Clairessa Boulet, KeLaurita QuintFNP  ondansetron (ZOFRAN) 4 MG tablet Take 1 tablet (4 mg total) by mouth every 8 (eight) hours as needed for nausea or vomiting. 07/20/20  Yes Ernie Kasler, KeLaurita QuintFNP  SUMAtriptan (IMITREX) 50 MG tablet Take 1 tablet (50 mg total) by mouth  daily as needed for migraine. May repeat in 2 hours if headache persists or recurs. 07/28/20  Yes Tamsen Reist, Laurita Quint, FNP  triamcinolone cream (KENALOG) 0.1 % Apply 1 application topically as needed.    Yes [provider]  valsartan (DIOVAN) 80 MG tablet Take 1.5 tablets (120 mg total) by mouth daily. 07/28/20  Yes Mylon Mabey, Laurita Quint, FNP  Vitamin D, Ergocalciferol, (DRISDOL) 1.25 MG (50000 UNIT) CAPS capsule Take 1 capsule (50,000 Units total) by mouth every 7 (seven) days. 07/29/20  Yes Samanth Mirkin, Laurita Quint, FNP    Past Medical History:  Diagnosis Date  . Abnormal uterine bleeding (AUB)   . Anxiety   . Arthritis    oa  . Depression   . GERD (gastroesophageal reflux disease)   . History of chest pain (10-20-2019  pt denies any cardiac s&s)   mutliple ED visits, atypical chest pain, chest  wall pain, muscularskeletal chest pain;  pt had cardiology evaulation dated 04-23-2018 note in epic , state atypical chest wall pain with negative d dimer and troponin's multiple times in epic, suggested CTA  (pt did not get done)  . Hyperlipidemia   . IDA (iron deficiency anemia)   . OSA on CPAP    followed by dr Dia Sitter  . Psoriasis   . Self-catheterizes urinary bladder    up to 10 x day    Past Surgical History:  Procedure Laterality Date  . DILATATION & CURETTAGE/HYSTEROSCOPY WITH MYOSURE N/A 10/24/2019   Procedure: DILATATION & CURETTAGE/HYSTEROSCOPY WITH MYOSURE;  Surgeon: Joseph Pierini, MD;  Location: [facility];  Service: Gynecology;  Laterality: N/A;  . HYSTEROSCOPY WITH NOVASURE N/A 04/06/2020   Procedure: DILATION AND CURRETTAGE; NOVASURE ABLATION;  Surgeon: Joseph Pierini, MD;  Location: [facility];  Service: Gynecology;  Laterality: N/A;  . LAPAROSCOPY N/A 04/06/2020   Procedure: LAPAROSCOPY OPERATIVE; LYSIS OF ADHESION;  Surgeon: Joseph Pierini, MD;  Location: [facility];  Service: Gynecology;  Laterality: N/A;  . TUBAL LIGATION Bilateral 02/05/2013   Procedure: POST PARTUM TUBAL LIGATION;  Surgeon: Woodroe Mode, MD;  Location: [facility];  Service: Gynecology;  Laterality: Bilateral;    Filshie clips    Social History   Tobacco Use  . Smoking status: Current Every Day Smoker    Packs/day: 0.50    Years: 20.00    Pack years: 10.00    Types: Cigarettes  . Smokeless tobacco: Never Used  Substance Use Topics  . Alcohol use: No    Family History  Problem Relation Age of Onset  . Diabetes Maternal Grandmother   . Diabetes Paternal Grandmother   . Anxiety disorder Mother   . Psoriasis Sister   . Heart attack Brother 34       deceased from this  . Anxiety disorder Brother   . Cirrhosis Maternal Grandfather        alcohol related  . Allergy (severe) Son   . Healthy Son   . Colon cancer Neg Hx   . Esophageal cancer  Neg Hx   . Colon polyps Neg Hx   . Stomach cancer Neg Hx   . Pancreatic cancer Neg Hx     Review of Systems  Constitutional: Positive for malaise/fatigue. Negative for chills and fever.  Eyes: Negative for blurred vision and double vision.  Respiratory: Negative for cough, shortness of breath and wheezing.   Cardiovascular: Negative for chest pain, palpitations and leg swelling.  Gastrointestinal: Negative for abdominal pain, blood in stool, constipation, diarrhea, heartburn,  nausea and vomiting.  Genitourinary: Negative for dysuria, frequency and hematuria.  Musculoskeletal: Positive for neck pain. Negative for back pain and joint pain.       Left foot pain  Skin: Negative for rash.  Neurological: Negative for dizziness, weakness and headaches.     OBJECTIVE:  Today's Vitals   08/04/20 0942  BP: 124/82  Pulse: 80  Temp: 97.9 F (36.6 C)  SpO2: 96%  Weight: 247 lb (112 kg)  Height: 5' 5"  (1.651 m)   Body mass index is 41.1 kg/m.   Physical Exam Constitutional:      General: She is not in acute distress.    Appearance: Normal appearance. She is not ill-appearing.  HENT:     Head: Normocephalic.  Cardiovascular:     Rate and Rhythm: Normal rate and regular rhythm.     Pulses: Normal pulses.     Heart sounds: Normal heart sounds. No murmur heard. No friction rub. No gallop.   Pulmonary:     Effort: Pulmonary effort is normal. No respiratory distress.     Breath sounds: Normal breath sounds. No stridor. No wheezing, rhonchi or rales.  Abdominal:     General: Bowel sounds are normal.     Palpations: Abdomen is soft.     Tenderness: There is no abdominal tenderness.  Musculoskeletal:     Cervical back: Normal range of motion. No rigidity or tenderness.     Right lower leg: No edema.     Left lower leg: No edema.     Right foot: Normal.     Left foot: Normal range of motion and normal capillary refill. Swelling and tenderness present. No deformity, bunion or bony  tenderness. Normal pulse.  Lymphadenopathy:     Cervical: No cervical adenopathy.  Skin:    General: Skin is warm and dry.  Neurological:     Mental Status: She is alert and oriented to person, place, and time.  Psychiatric:        Mood and Affect: Mood normal.        Behavior: Behavior normal.     No results found for this or any previous visit (from the past 24 hour(s)).  No results found.   ASSESSMENT and PLAN  Problem List Items Addressed This Visit      Cardiovascular and Mediastinum   Essential hypertension   Relevant Orders   Amb ref to Medical Nutrition Therapy-MNT   Migraine without aura and without status migrainosus, not intractable     Respiratory   OSA on CPAP     Digestive   Gastroesophageal reflux disease without esophagitis     Musculoskeletal and Integument   Psoriatic arthritis ([facility])     Other   Vitamin D deficiency    Other Visit Diagnoses    Left foot pain    -  Primary   Relevant Orders   Ambulatory referral to Podiatry   Body mass index (BMI) of 40.1-44.9 in adult ([place])   (Chronic)       Plan . Continue Valsartan 133m and HCTZ 285mfor BP goal< 130/80 . Referral placed to nutrition to assist with LFM goals . Referral to podiatry for foot pain and prev abnormal xrays of bone spurs and OA . Educated on OA management: OTC and conservative treatments: takes meloxicam . Stop Vitamin D 50k units daily and transition to 2000 IU of OTC will recheck next visit . Continue with Imitrex and zofran for migraines, monitor for triggers . Anxiety stable on  Lexapro and xanax    Return in about 3 months (around 11/02/2020).    Huston Foley Tagg Eustice, FNP-BC Primary Care at [place], [place] [address] Ph.  [phone] Fax [phone]

## 2020-08-04 NOTE — Patient Instructions (Addendum)
  Hypertension, Adult Hypertension is another name for high blood pressure. High blood pressure forces your heart to work harder to pump blood. This can cause problems over time. There are two numbers in a blood pressure reading. There is a top number (systolic) over a bottom number (diastolic). It is best to have a blood pressure that is below 120/80. Healthy choices can help lower your blood pressure, or you may need medicine to help lower it. What are the causes? The cause of this condition is not known. Some conditions may be related to high blood pressure. What increases the risk?  Smoking.  Having type 2 diabetes mellitus, high cholesterol, or both.  Not getting enough exercise or physical activity.  Being overweight.  Having too much fat, sugar, calories, or salt (sodium) in your diet.  Drinking too much alcohol.  Having long-term (chronic) kidney disease.  Having a family history of high blood pressure.  Age. Risk increases with age.  Race. You may be at higher risk if you are African American.  Gender. Men are at higher risk than women before age 45. After age 65, women are at higher risk than men.  Having obstructive sleep apnea.  Stress. What are the signs or symptoms?  High blood pressure may not cause symptoms. Very high blood pressure (hypertensive crisis) may cause: ? Headache. ? Feelings of worry or nervousness (anxiety). ? Shortness of breath. ? Nosebleed. ? A feeling of being sick to your stomach (nausea). ? Throwing up (vomiting). ? Changes in how you see. ? Very bad chest pain. ? Seizures. How is this treated?  This condition is treated by making healthy lifestyle changes, such as: ? Eating healthy foods. ? Exercising more. ? Drinking less alcohol.  Your health care provider may prescribe medicine if lifestyle changes are not enough to get your blood pressure under control, and if: ? Your top number is above 130. ? Your bottom number is  above 80.  Your personal target blood pressure may vary. Follow these instructions at home: Eating and drinking  If told, follow the DASH eating plan. To follow this plan: ? Fill one half of your plate at each meal with fruits and vegetables. ? Fill one fourth of your plate at each meal with whole grains. Whole grains include whole-wheat pasta, brown rice, and whole-grain bread. ? Eat or drink low-fat dairy products, such as skim milk or low-fat yogurt. ? Fill one fourth of your plate at each meal with low-fat (lean) proteins. Low-fat proteins include fish, chicken without skin, eggs, beans, and tofu. ? Avoid fatty meat, cured and processed meat, or chicken with skin. ? Avoid pre-made or processed food.  Eat less than 1,500 mg of salt each day.  Do not drink alcohol if: ? Your doctor tells you not to drink. ? You are pregnant, may be pregnant, or are planning to become pregnant.  If you drink alcohol: ? Limit how much you use to:  0-1 drink a day for women.  0-2 drinks a day for men. ? Be aware of how much alcohol is in your drink. In the [place] one drink equals one 12 oz bottle of beer (355 mL), one 5 oz glass of wine (148 mL), or one 1 oz glass of hard liquor (44 mL).   Lifestyle  Work with your doctor to stay at a healthy weight or to lose weight. Ask your doctor what the best weight is for you.  Get at least 30 minutes of   exercise most days of the week. This may include walking, swimming, or biking.  Get at least 30 minutes of exercise that strengthens your muscles (resistance exercise) at least 3 days a week. This may include lifting weights or doing Pilates.  Do not use any products that contain nicotine or tobacco, such as cigarettes, e-cigarettes, and chewing tobacco. If you need help quitting, ask your doctor.  Check your blood pressure at home as told by your doctor.  Keep all follow-up visits as told by your doctor. This is important.   Medicines  Take  over-the-counter and prescription medicines only as told by your doctor. Follow directions carefully.  Do not skip doses of blood pressure medicine. The medicine does not work as well if you skip doses. Skipping doses also puts you at risk for problems.  Ask your doctor about side effects or reactions to medicines that you should watch for. Contact a doctor if you:  Think you are having a reaction to the medicine you are taking.  Have headaches that keep coming back (recurring).  Feel dizzy.  Have swelling in your ankles.  Have trouble with your vision. Get help right away if you:  Get a very bad headache.  Start to feel mixed up (confused).  Feel weak or numb.  Feel faint.  Have very bad pain in your: ? Chest. ? Belly (abdomen).  Throw up more than once.  Have trouble breathing. Summary  Hypertension is another name for high blood pressure.  High blood pressure forces your heart to work harder to pump blood.  For most people, a normal blood pressure is less than 120/80.  Making healthy choices can help lower blood pressure. If your blood pressure does not get lower with healthy choices, you may need to take medicine. This information is not intended to replace advice given to you by your health care provider. Make sure you discuss any questions you have with your health care provider. Document Revised: 03/06/2018 Document Reviewed: 03/06/2018 Elsevier Patient Education  2021 Elsevier Inc.   If you have lab work done today you will be contacted with your lab results within the next 2 weeks.  If you have not heard from us then please contact us. The fastest way to get your results is to register for My Chart.   IF you received an x-ray today, you will receive an invoice from [facility]. Please contact [facility] at [phone] with questions or concerns regarding your invoice.   IF you received labwork today, you will receive an invoice from  [facility]. Please contact [facility] at [phone] with questions or concerns regarding your invoice.   Our billing staff will not be able to assist you with questions regarding bills from these companies.  You will be contacted with the lab results as soon as they are available. The fastest way to get your results is to activate your My Chart account. Instructions are located on the last page of this paperwork. If you have not heard from us regarding the results in 2 weeks, please contact this office.      

## 2020-08-05 ENCOUNTER — Telehealth: Payer: Self-pay | Admitting: *Deleted

## 2020-08-05 NOTE — Telephone Encounter (Signed)
Patient called to follow up from [place] on 07/28/20 patient said she is ready to proceed with surgery. Report the bleeding is slight heavier today.

## 2020-08-05 NOTE — Telephone Encounter (Signed)
As we discussed I will not be able to do the hysterectomy myself since I will not be here long enough for her follow up. She will have to schedule with one of my partners to discuss hysterectomy. Please let me know who she schedules with so I can give them a heads up about the patient. She can try Megace 40 mg 1-2 times per day in the meanwhile

## 2020-08-06 NOTE — Telephone Encounter (Signed)
Left message for patient to call.

## 2020-08-09 NOTE — Telephone Encounter (Signed)
I relayed Dr. Scarlette Ar reply to the patient.  She declined Megace Rx stating she has it on hand. She would like to schedule an appointment with [facility] and I will have front desk call her to arrange.

## 2020-08-10 NOTE — Telephone Encounter (Signed)
Patient will see Dr. Maura Crandall feb 2 @ 1:30 .

## 2020-08-11 ENCOUNTER — Encounter: Payer: Self-pay | Admitting: Obstetrics & Gynecology

## 2020-08-11 ENCOUNTER — Other Ambulatory Visit: Payer: Self-pay

## 2020-08-11 ENCOUNTER — Ambulatory Visit: Payer: 59 | Admitting: Obstetrics & Gynecology

## 2020-08-11 VITALS — BP 122/78 | HR 76 | Resp 14 | Ht 65.0 in | Wt 245.5 lb

## 2020-08-11 DIAGNOSIS — R102 Pelvic and perineal pain: Secondary | ICD-10-CM

## 2020-08-11 DIAGNOSIS — N946 Dysmenorrhea, unspecified: Secondary | ICD-10-CM

## 2020-08-11 DIAGNOSIS — N938 Other specified abnormal uterine and vaginal bleeding: Secondary | ICD-10-CM | POA: Diagnosis not present

## 2020-08-11 DIAGNOSIS — Z9889 Other specified postprocedural states: Secondary | ICD-10-CM

## 2020-08-11 NOTE — Progress Notes (Signed)
    Theresa Sawyer May 20, 1981 [id number]        39 y.o.  [id number]   RP: Menometrorrhagia/Dysmenorrhea/Chronic Pelvic Pain for counseling on Hysterectomy  HPI:  [facility] Excision of Polyps/D+C Benign Patho 10/2019.  Still experiencing heavy irregular menses with severe dysmenorrhea.  Chronic pelvic pain.  Desires a Hysterectomy.   OB History  Gravida Para Term Preterm AB Living  3 1 1  0 2 1  SAB IAB Ectopic Multiple Live Births  1 1 0 0 1    # Outcome Date GA Lbr Len/2nd Weight Sex Delivery Anes PTL Lv  3 Term 02/04/13 [id number]:35 / 03:55 7 lb 10.8 oz (3.481 kg) M Vag-Spont EPI  LIV     Birth Comments: WNL  2 SAB         DEC  1 IAB         DEC    Past medical history,surgical history, problem list, medications, allergies, family history and social history were all reviewed and documented in the EPIC chart.   Directed ROS with pertinent positives and negatives documented in the history of present illness/assessment and plan.  Exam:  Vitals:   08/11/20 1324  BP: 122/78  Pulse: 76  Resp: 14  Weight: 245 lb 8 oz (111.4 kg)  Height: 5' 5"  (1.651 m)   General appearance:  Normal  Gynecologic exam: Deferred  Pelvic [place]2/2021: Uterus 8.6x6.0 x 5.1 cm, anteverted orientation No myometrial masses Endometrium significantly thick at 21 mm  3D imaging indicates several endometrial polyps likely present Bilateral ovaries normal, normal follicle patter No adnexal masses No free fluid.   Assessment/Plan:  40y.o. G3P1021   1. Pelvic pain in female Chronic pelvic pain with severe dysmenorrhea with menometrorrhagia.  HSC/Excision of Polyp/D+C 10/2019, benign pathology.  Counseling on different management plans.  Patient prefers a Hysterectomy at this point.  Decision to proceed with XI Robotic TLH/BSO.  F/U Preop visit.  2. Severe dysmenorrhea Severe persistent dysmenorrhea.  3. DUB (dysfunctional uterine bleeding) H/O Endometrial Polyps.  Menometrorrhagia persisting post excision  of Polyps.  Other orders - rizatriptan (MAXALT) 5 MG tablet; Take 5 mg by mouth 2 (two) times daily as needed.  MPrincess BruinsMD, 2:04 PM 08/11/2020

## 2020-08-12 ENCOUNTER — Encounter: Payer: Self-pay | Admitting: Podiatry

## 2020-08-12 ENCOUNTER — Other Ambulatory Visit: Payer: Self-pay

## 2020-08-12 ENCOUNTER — Telehealth: Payer: Self-pay

## 2020-08-12 ENCOUNTER — Ambulatory Visit (INDEPENDENT_AMBULATORY_CARE_PROVIDER_SITE_OTHER): Payer: 59

## 2020-08-12 ENCOUNTER — Ambulatory Visit: Payer: 59 | Admitting: Podiatry

## 2020-08-12 DIAGNOSIS — M79672 Pain in left foot: Secondary | ICD-10-CM

## 2020-08-12 DIAGNOSIS — M79671 Pain in right foot: Secondary | ICD-10-CM

## 2020-08-12 DIAGNOSIS — M722 Plantar fascial fibromatosis: Secondary | ICD-10-CM | POA: Diagnosis not present

## 2020-08-12 DIAGNOSIS — M779 Enthesopathy, unspecified: Secondary | ICD-10-CM

## 2020-08-12 MED ORDER — TRIAMCINOLONE ACETONIDE 10 MG/ML IJ SUSP
10.0000 mg | Freq: Once | INTRAMUSCULAR | Status: AC
  Administered 2020-08-12: 10 mg

## 2020-08-12 MED ORDER — PREDNISONE 10 MG PO TABS: ORAL_TABLET | ORAL | 0 refills | Status: DC

## 2020-08-12 NOTE — Telephone Encounter (Signed)
Call to patient. Per DPR, OK to leave message on voicemail.   Left voicemail requesting a return call to [facility] to review benefits for recommended surgery with Princess Bruins, MD, [place], [place], [place].

## 2020-08-12 NOTE — Telephone Encounter (Signed)
Spoke with patient regarding surgery benefits. Patient acknowledges understanding of information presented. Patient is aware that benefits presented are professional benefits only. Patient is aware that once surgery is scheduled, the hospital will call with separate benefits. See account note.  Routing to [employer], RMA, for surgery scheduling.

## 2020-08-12 NOTE — Telephone Encounter (Signed)
Theresa Sawyer, [place], [place] CPT [phone]      ICD.10 in order N92.0/R10.2, N94.6, Z98.890   Thanks!   ----- Message -----  From: Princess Bruins, MD  Sent: 08/11/2020  2:04 PM EST  To: Theresa Sawyer, RMA  Subject: Schedule surgery                 Persistent menometrorrhagia/Pelvic pain-pressure/Dysmenorrhea/Dyspareunia post Endometrial Ablation/Probable Adenomyosis   XI Robotic Total Laparoscopic Hysterectomy with Bilateral Salpingo-Oophorectomy   [facility]   2 hours   F/U Preop with me

## 2020-08-12 NOTE — Telephone Encounter (Signed)
I called and spoke with patient about scheduling surgery. I had some time availability in 2 weeks on 08/25/20. She was surprised by how quick I could schedule it. She needs to check with her job and her finances and said she would call me tomorrow. I am off tomorrow so I told her I will hold it until Monday for her. She did say she is most likely to be able to do it. I will wait to hear from her.

## 2020-08-13 DIAGNOSIS — M722 Plantar fascial fibromatosis: Secondary | ICD-10-CM | POA: Diagnosis not present

## 2020-08-13 MED ORDER — TRIAMCINOLONE ACETONIDE 10 MG/ML IJ SUSP
10.0000 mg | Freq: Once | INTRAMUSCULAR | Status: AC
  Administered 2020-08-13: 10 mg

## 2020-08-13 NOTE — Progress Notes (Signed)
Subjective:   Patient ID: Theresa Sawyer, female   DOB: 40 y.o.   MRN: [id number]   HPI Patient presents stating that she has significant discomfort in her left foot which is occurring mostly in the heel and the ankle.  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  States she is having trouble bearing weight on her foot and states that it is becoming increasingly sore.  Patient does not smoke likes to be active   Review of Systems  All other systems reviewed and are negative.       Objective:  Physical Exam Vitals and nursing note reviewed.  Constitutional:      Appearance: She is well-developed and well-nourished.  Cardiovascular:     Pulses: Intact distal pulses.  Pulmonary:     Effort: Pulmonary effort is normal.  Musculoskeletal:        General: Normal range of motion.  Skin:    General: Skin is warm.  Neurological:     Mental Status: She is alert.     Neurovascular status intact muscle strength was found to be adequate range of motion adequate.  Patient has collapse medial longitudinal arch left and right and has exquisite discomfort plantar heel left and into the left ankle sinus tarsi.  Patient has multiple signs of skin issues consistent with psoriasis and does take medication to try to control it.  Patient has good digit perfusion well oriented x3     Assessment:  Severe sinus tarsitis left along with plantar fasciitis left with inflammation and systemic inflammatory disease     Plan:  H&P reviewed condition.  At this point I went ahead and I did sterile prep and injected the plantar fascial left 3 mg Dexasone Kenalog and the sinus tarsi left 3 mg dexamethasone Kenalog and I advised this patient on supportive therapy and placed on 12-day Sterapred DS Dosepak.  I want to see back in the next 3 weeks and I encouraged her to call us if any issues were to occur and we will avoid  treatment of the right but may be necessary in future  X-rays indicate that there is collapse medial longitudinal arch left and right with plantar spur formation bilateral

## 2020-08-16 NOTE — Telephone Encounter (Signed)
Left message for patient to call me asap to let me know regarding 08/25/20 surgery date.

## 2020-08-17 ENCOUNTER — Encounter: Payer: Self-pay | Admitting: Obstetrics & Gynecology

## 2020-08-18 NOTE — Telephone Encounter (Signed)
Patient called back that day and let me know she cannot do 08/25/20. I will call her when March schedule is available.

## 2020-08-19 NOTE — Telephone Encounter (Signed)
I spoke with patient and rescheduled her surgery to 09/28/20 at [facility] at 8:30am.   Covid test will be 09/21/20 at 3:00pm. Patient was reminded of quarantine protocol after Covid test.

## 2020-08-20 ENCOUNTER — Telehealth: Payer: Self-pay | Admitting: *Deleted

## 2020-08-20 MED ORDER — HYDROCODONE-ACETAMINOPHEN 7.5-325 MG PO TABS
1.0000 | ORAL_TABLET | Freq: Four times a day (QID) | ORAL | 0 refills | Status: DC | PRN
Start: 2020-08-20 — End: 2020-09-09

## 2020-08-20 NOTE — Telephone Encounter (Signed)
Spoke with patient told her Rx was sent to pharmacy. Patient said she is not currently bleeding yet, however she is having brown discharge so her cycle will be starting soon.

## 2020-08-20 NOTE — Telephone Encounter (Signed)
Sent Norco.

## 2020-08-20 NOTE — Telephone Encounter (Signed)
Patient called asking if you would be willing to prescribed Rx to help with dysmeorrha, scheduled for XI Robotic TLH/BSO on 09/28/20. Patient is taking OTC Tylenol and ibuprofen and no relief. Please advise

## 2020-08-25 ENCOUNTER — Encounter (HOSPITAL_BASED_OUTPATIENT_CLINIC_OR_DEPARTMENT_OTHER): Payer: Self-pay

## 2020-08-25 ENCOUNTER — Ambulatory Visit (HOSPITAL_BASED_OUTPATIENT_CLINIC_OR_DEPARTMENT_OTHER): Admit: 2020-08-25 | Payer: 59 | Admitting: Obstetrics & Gynecology

## 2020-08-25 SURGERY — HYSTERECTOMY, TOTAL, ROBOT-ASSISTED, LAPAROSCOPIC, WITH BILATERAL SALPINGO-OOPHORECTOMY
Anesthesia: General | Laterality: Bilateral

## 2020-08-26 ENCOUNTER — Other Ambulatory Visit: Payer: Self-pay | Admitting: Emergency Medicine

## 2020-08-26 DIAGNOSIS — F418 Other specified anxiety disorders: Secondary | ICD-10-CM

## 2020-08-26 NOTE — Telephone Encounter (Signed)
Requested medications are due for refill today yes  Requested medications are on the active medication list yes  Last refill 1/12  Last visit 03/2020  Future visit scheduled 09/2020  Notes to clinic not delegated

## 2020-08-27 NOTE — Telephone Encounter (Addendum)
Patient is requesting a refill of the following medications: Requested Prescriptions   Pending Prescriptions Disp Refills  . ALPRAZolam (XANAX) 0.5 MG tablet [Pharmacy Med Name: ALPRAZOLAM 0.5MG TABLETS] 30 tablet     Sig: TAKE 1 TABLET BY MOUTH EVERY DAY AS NEEDED FOR ANXIETY    Date of patient request: 08/26/20 Last office visit: 08/04/20 Date of last refill: 03/18/20 Last refill amount: 30+1 Follow up time period per chart: 09/16/20

## 2020-09-02 ENCOUNTER — Ambulatory Visit (INDEPENDENT_AMBULATORY_CARE_PROVIDER_SITE_OTHER): Payer: 59 | Admitting: Podiatry

## 2020-09-02 ENCOUNTER — Other Ambulatory Visit: Payer: Self-pay

## 2020-09-02 DIAGNOSIS — M779 Enthesopathy, unspecified: Secondary | ICD-10-CM | POA: Diagnosis not present

## 2020-09-02 DIAGNOSIS — M722 Plantar fascial fibromatosis: Secondary | ICD-10-CM | POA: Diagnosis not present

## 2020-09-02 MED ORDER — MELOXICAM 15 MG PO TABS: 15.0000 mg | ORAL_TABLET | Freq: Every day | ORAL | 0 refills | Status: DC

## 2020-09-02 NOTE — Patient Instructions (Signed)

## 2020-09-05 NOTE — Progress Notes (Signed)
Subjective: 40 year old female presents the office today for follow-up evaluation of left foot pain worse than right.  She recently was seen by Dr. Paulla Dolly and was diagnosed with a sinus tarsitis with plantar fasciitis.  She had an injection which she states lasted for a few days.  The oral steroids not seem to help much.  She does have history of psoriatic arthritis.  She has majority discomfort in the arch as well as medial ankle as well as sinus tarsi in the left side.  This has been ongoing for last few months.  No injury that started this. Denies any systemic complaints such as fevers, chills, nausea, vomiting. No acute changes since last appointment, and no other complaints at this time.   Objective: AAO x3, NAD DP/PT pulses palpable bilaterally, CRT less than 3 seconds Mild discomfort on medial band plantar fashion the arch of the foot on the left side.  My discomfort with sinus tarsi as well as on the course of the posterior tibial, flexor tendons.  Tendons appear to be intact.  MMT 5/5.  Minimal edema.  Is no erythema or warmth. No pain with calf compression, swelling, warmth, erythema  Assessment: Left plantar fasciitis, capsulitis with tendinitis  Plan: -All treatment options discussed with the patient including all alternatives, risks, complications.  -I do think the majority of her symptoms are coming from biomechanical issues and when she is walking however psoriatic arthritis may be contributing as well.  Prescribe meloxicam discussed side effects.  She not take any medication currently for the arthritis.  Dispensed power step inserts.  Discussed traction, icing daily.  Discussed shoe modifications as well.  Consider steroid injection for the plantar fascia if needed. -Patient encouraged to call the office with any questions, concerns, change in symptoms.   Trula Slade DPM

## 2020-09-09 ENCOUNTER — Other Ambulatory Visit: Payer: Self-pay

## 2020-09-09 ENCOUNTER — Encounter: Payer: Self-pay | Admitting: Family Medicine

## 2020-09-09 ENCOUNTER — Ambulatory Visit: Payer: 59 | Admitting: Family Medicine

## 2020-09-09 VITALS — BP 130/88 | HR 93 | Temp 98.2°F | Ht 65.0 in | Wt 250.0 lb

## 2020-09-09 DIAGNOSIS — M545 Low back pain, unspecified: Secondary | ICD-10-CM

## 2020-09-09 DIAGNOSIS — G8929 Other chronic pain: Secondary | ICD-10-CM | POA: Diagnosis not present

## 2020-09-09 MED ORDER — MELOXICAM 15 MG PO TABS: 15.0000 mg | ORAL_TABLET | Freq: Every day | ORAL | 0 refills | Status: DC

## 2020-09-09 MED ORDER — CYCLOBENZAPRINE HCL 10 MG PO TABS: 10.0000 mg | ORAL_TABLET | Freq: Three times a day (TID) | ORAL | 0 refills | Status: DC | PRN

## 2020-09-09 MED ORDER — DICLOFENAC SODIUM 1 % EX GEL: 2.0000 g | Freq: Four times a day (QID) | CUTANEOUS | 2 refills | Status: DC

## 2020-09-09 NOTE — Patient Instructions (Addendum)
Acute Back Pain, Adult Acute back pain is sudden and usually short-lived. It is often caused by an injury to the muscles and tissues in the back. The injury may result from:  A muscle or ligament getting overstretched or torn (strained). Ligaments are tissues that connect bones to each other. Lifting something improperly can cause a back strain.  Wear and tear (degeneration) of the spinal disks. Spinal disks are circular tissue that provide cushioning between the bones of the spine (vertebrae).  Twisting motions, such as while playing sports or doing yard work.  A hit to the back.  Arthritis. You may have a physical exam, lab tests, and imaging tests to find the cause of your pain. Acute back pain usually goes away with rest and home care. Follow these instructions at home: Managing pain, stiffness, and swelling  Treatment may include medicines for pain and inflammation that are taken by mouth or applied to the skin, prescription pain medicine, or muscle relaxants. Take over-the-counter and prescription medicines only as told by your health care provider.  Your health care provider may recommend applying ice during the first 24-48 hours after your pain starts. To do this: ? Put ice in a plastic bag. ? Place a towel between your skin and the bag. ? Leave the ice on for 20 minutes, 2-3 times a day.  If directed, apply heat to the affected area as often as told by your health care provider. Use the heat source that your health care provider recommends, such as a moist heat pack or a heating pad. ? Place a towel between your skin and the heat source. ? Leave the heat on for 20-30 minutes. ? Remove the heat if your skin turns bright red. This is especially important if you are unable to feel pain, heat, or cold. You have a greater risk of getting burned. Activity  Do not stay in bed. Staying in bed for more than 1-2 days can delay your recovery.  Sit up and stand up straight. Avoid  leaning forward when you sit or hunching over when you stand. ? If you work at a desk, sit close to it so you do not need to lean over. Keep your chin tucked in. Keep your neck drawn back, and keep your elbows bent at a 90-degree angle (right angle). ? Sit high and close to the steering wheel when you drive. Add lower back (lumbar) support to your car seat, if needed.  Take short walks on even surfaces as soon as you are able. Try to increase the length of time you walk each day.  Do not sit, drive, or stand in one place for more than 30 minutes at a time. Sitting or standing for long periods of time can put stress on your back.  Do not drive or use heavy machinery while taking prescription pain medicine.  Use proper lifting techniques. When you bend and lift, use positions that put less stress on your back: ? [place] your knees. ? Keep the load close to your body. ? Avoid twisting.  Exercise regularly as told by your health care provider. Exercising helps your back heal faster and helps prevent back injuries by keeping muscles strong and flexible.  Work with a physical therapist to make a safe exercise program, as recommended by your health care provider. Do any exercises as told by your physical therapist.   Lifestyle  Maintain a healthy weight. Extra weight puts stress on your back and makes it difficult  to have good posture.  Avoid activities or situations that make you feel anxious or stressed. Stress and anxiety increase muscle tension and can make back pain worse. Learn ways to manage anxiety and stress, such as through exercise. General instructions  Sleep on a firm mattress in a comfortable position. Try lying on your side with your knees slightly bent. If you lie on your back, put a pillow under your knees.  Follow your treatment plan as told by your health care provider. This may include: ? Cognitive or behavioral therapy. ? Acupuncture or massage therapy. ? Meditation or  yoga. Contact a health care provider if:  You have pain that is not relieved with rest or medicine.  You have increasing pain going down into your legs or buttocks.  Your pain does not improve after 2 weeks.  You have pain at night.  You lose weight without trying.  You have a fever or chills. Get help right away if:  You develop new bowel or bladder control problems.  You have unusual weakness or numbness in your arms or legs.  You develop nausea or vomiting.  You develop abdominal pain.  You feel faint. Summary  Acute back pain is sudden and usually short-lived.  Use proper lifting techniques. When you bend and lift, use positions that put less stress on your back.  Take over-the-counter and prescription medicines and apply heat or ice as directed by your health care provider. This information is not intended to replace advice given to you by your health care provider. Make sure you discuss any questions you have with your health care provider. Document Revised: 03/19/2020 Document Reviewed: 03/19/2020 Elsevier Patient Education  2021 [employer].  If you have lab work done today you will be contacted with your lab results within the next 2 weeks.  If you have not heard from [place] then please contact us. The fastest way to get your results is to register for My Chart.   IF you received an x-ray today, you will receive an invoice from [facility]. Please contact [facility] at [phone] with questions or concerns regarding your invoice.   IF you received labwork today, you will receive an invoice from [place]. Please contact [facility] at [phone] with questions or concerns regarding your invoice.   Our billing staff will not be able to assist you with questions regarding bills from these companies.  You will be contacted with the lab results as soon as they are available. The fastest way to get your results is to activate your My Chart account.  Instructions are located on the last page of this paperwork. If you have not heard from [place] regarding the results in 2 weeks, please contact this office.

## 2020-09-09 NOTE — Progress Notes (Signed)
3/3/20222:04 PM  Theresa Sawyer 12-02-1980, 40 y.o., female [id number]  Chief Complaint  Patient presents with  . chronic back pain     Lumbar for months has worsened in last 2 weeks     HPI:   Patient is a 40 y.o. female with past medical history significant for HTN, migraines and fatigue who presents today for back pain.  Has a history of back pain Has been worse over the past few days When woke up this morning was in extreme pain. Back pain hurts more with any movement but even at rest Has been using a heating pad and tylenol Has never had it this severe in the past Has been taking the meloxicam for pain Meloxicam helps slightly Does not need a work note for tomorrow Denies changes in sensation or weakness Denies bowel or bladder changes    Depression screen [facility] 2/9 08/04/2020 07/28/2020 07/20/2020  Decreased Interest 0 0 0  Down, Depressed, Hopeless 0 0 0  PHQ - 2 Score 0 0 0  Altered sleeping - 0 -  Tired, decreased energy - 0 -  Change in appetite - 0 -  Feeling bad or failure about yourself  - 0 -  Trouble concentrating - 0 -  Moving slowly or fidgety/restless - 0 -  Suicidal thoughts - 0 -  PHQ-9 Score - 0 -  Difficult doing work/chores - Not difficult at all -  Some recent data might be hidden    Fall Risk  08/04/2020 07/28/2020 07/20/2020 04/02/2020 03/18/2020  Falls in the past year? 0 0 0 0 0  Number falls in past yr: 0 0 0 0 -  Injury with Fall? 0 0 0 0 -  Risk for fall due to : - - - No Fall Risks -  Follow up Falls evaluation completed Falls evaluation completed Falls evaluation completed Falls evaluation completed Falls evaluation completed     Allergies  Allergen Reactions  . Nsaids     Do not take per rheumatologist    Prior to Admission medications   Medication Sig Start Date End Date Taking? Authorizing Provider  ALPRAZolam (XANAX) 0.5 MG tablet TAKE 1 TABLET BY MOUTH ONCE DAILY AS NEEDED FOR ANXIETY 03/18/20  Yes Sagardia, Ines Bloomer, MD   escitalopram (LEXAPRO) 20 MG tablet TAKE 1 TABLET(20 MG) BY MOUTH DAILY Patient taking differently: at bedtime. 02/16/20  Yes Sagardia, Ines Bloomer, MD  famotidine (PEPCID) 40 MG tablet TAKE 1 TABLET(40 MG) BY MOUTH TWICE DAILY 05/18/20  Yes Thornton Park, MD  hydrochlorothiazide (HYDRODIURIL) 25 MG tablet Take 1 tablet (25 mg total) by mouth daily. 07/20/20  Yes Hellena Pridgen, Laurita Quint, FNP  ondansetron (ZOFRAN) 4 MG tablet Take 1 tablet (4 mg total) by mouth every 8 (eight) hours as needed for nausea or vomiting. 07/20/20  Yes Salaya Holtrop, Laurita Quint, FNP  SUMAtriptan (IMITREX) 50 MG tablet Take 1 tablet (50 mg total) by mouth daily as needed for migraine. May repeat in 2 hours if headache persists or recurs. 07/28/20  Yes Shirlena Brinegar, Laurita Quint, FNP  triamcinolone cream (KENALOG) 0.1 % Apply 1 application topically as needed.    Yes [provider]  valsartan (DIOVAN) 80 MG tablet Take 1.5 tablets (120 mg total) by mouth daily. 07/28/20  Yes Ashwin Tibbs, Laurita Quint, FNP  Vitamin D, Ergocalciferol, (DRISDOL) 1.25 MG (50000 UNIT) CAPS capsule Take 1 capsule (50,000 Units total) by mouth every 7 (seven) days. 07/29/20  Yes Bravlio Luca, Laurita Quint, FNP    Past Medical History:  Diagnosis Date  . Abnormal uterine bleeding (AUB)   . Anxiety   . Arthritis    oa  . Depression   . GERD (gastroesophageal reflux disease)   . History of chest pain (10-20-2019  pt denies any cardiac s&s)   mutliple ED visits, atypical chest pain, chest wall pain, muscularskeletal chest pain;  pt had cardiology evaulation dated 04-23-2018 note in epic , state atypical chest wall pain with negative d dimer and troponin's multiple times in epic, suggested CTA  (pt did not get done)  . Hyperlipidemia   . IDA (iron deficiency anemia)   . OSA on CPAP    followed by dr Dia Sitter  . Psoriasis   . Self-catheterizes urinary bladder    up to 10 x day    Past Surgical History:  Procedure Laterality Date  . DILATATION & CURETTAGE/HYSTEROSCOPY WITH MYOSURE N/A  10/24/2019   Procedure: DILATATION & CURETTAGE/HYSTEROSCOPY WITH MYOSURE;  Surgeon: Joseph Pierini, MD;  Location: [facility];  Service: Gynecology;  Laterality: N/A;  . HYSTEROSCOPY WITH NOVASURE N/A 04/06/2020   Procedure: DILATION AND CURRETTAGE; NOVASURE ABLATION;  Surgeon: Joseph Pierini, MD;  Location: [facility];  Service: Gynecology;  Laterality: N/A;  . LAPAROSCOPY N/A 04/06/2020   Procedure: LAPAROSCOPY OPERATIVE; LYSIS OF ADHESION;  Surgeon: Joseph Pierini, MD;  Location: [facility];  Service: Gynecology;  Laterality: N/A;  . TUBAL LIGATION Bilateral 02/05/2013   Procedure: POST PARTUM TUBAL LIGATION;  Surgeon: Woodroe Mode, MD;  Location: [facility];  Service: Gynecology;  Laterality: Bilateral;    Filshie clips    Social History   Tobacco Use  . Smoking status: Current Every Day Smoker    Packs/day: 0.50    Years: 20.00    Pack years: 10.00    Types: Cigarettes  . Smokeless tobacco: Never Used  Substance Use Topics  . Alcohol use: No    Family History  Problem Relation Age of Onset  . Diabetes Maternal Grandmother   . Diabetes Paternal Grandmother   . Anxiety disorder Mother   . Psoriasis Sister   . Heart attack Brother 34       deceased from this  . Anxiety disorder Brother   . Cirrhosis Maternal Grandfather        alcohol related  . Allergy (severe) Son   . Healthy Son   . Colon cancer Neg Hx   . Esophageal cancer Neg Hx   . Colon polyps Neg Hx   . Stomach cancer Neg Hx   . Pancreatic cancer Neg Hx     Review of Systems  Constitutional: Negative for chills, fever and malaise/fatigue.  Respiratory: Negative for cough, shortness of breath and wheezing.   Cardiovascular: Negative for chest pain, palpitations and leg swelling.  Gastrointestinal: Negative for abdominal pain, constipation, diarrhea, heartburn, nausea and vomiting.  Genitourinary: Negative for dysuria, frequency and hematuria.  Musculoskeletal:  Positive for back pain. Negative for joint pain and neck pain.       Left foot pain  Skin: Negative for rash.  Neurological: Negative for dizziness, tingling, sensory change, focal weakness, weakness and headaches.     OBJECTIVE:  Today's Vitals   09/09/20 1317  BP: 130/88  Pulse: 93  Temp: 98.2 F (36.8 C)  SpO2: 98%  Weight: 250 lb (113.4 kg)  Height: 5' 5"  (1.651 m)   Body mass index is 41.6 kg/m.   Physical Exam Constitutional:      General: She is not in acute  distress.    Appearance: Normal appearance. She is not ill-appearing.  HENT:     Head: Normocephalic.  Cardiovascular:     Rate and Rhythm: Normal rate and regular rhythm.     Pulses: Normal pulses.     Heart sounds: Normal heart sounds. No murmur heard. No friction rub. No gallop.   Pulmonary:     Effort: Pulmonary effort is normal. No respiratory distress.     Breath sounds: Normal breath sounds. No stridor. No wheezing, rhonchi or rales.  Abdominal:     General: Bowel sounds are normal.     Palpations: Abdomen is soft.     Tenderness: There is no abdominal tenderness.  Musculoskeletal:     Cervical back: Normal and normal range of motion.     Thoracic back: Normal.     Lumbar back: Spasms and tenderness present. No swelling, deformity, signs of trauma or bony tenderness. Decreased range of motion. Negative right straight leg raise test and negative left straight leg raise test.       Back:     Right lower leg: No edema.     Left lower leg: No edema.  Lymphadenopathy:     Cervical: No cervical adenopathy.  Skin:    General: Skin is warm and dry.  Neurological:     Mental Status: She is alert and oriented to person, place, and time.  Psychiatric:        Mood and Affect: Mood normal.        Behavior: Behavior normal.     No results found for this or any previous visit (from the past 24 hour(s)).  No results found.   ASSESSMENT and PLAN  Problem List Items Addressed This Visit   None    Visit Diagnoses    Chronic bilateral low back pain without sciatica    -  Primary   Relevant Medications   cyclobenzaprine (FLEXERIL) 10 MG tablet   meloxicam (MOBIC) 15 MG tablet   diclofenac Sodium (VOLTAREN) 1 % GEL     Plan Meloxicam, flexeril and voltaren gel for pain Discussed conservative treatments  Return in about 3 months (around 12/10/2020).    Huston Foley Hughey Rittenberry, FNP-BC Primary [facility] at [facility] [place], [place] [address] Ph.  [phone] Fax [phone]

## 2020-09-13 ENCOUNTER — Other Ambulatory Visit: Payer: Self-pay | Admitting: Family Medicine

## 2020-09-13 ENCOUNTER — Telehealth: Payer: Self-pay | Admitting: Family Medicine

## 2020-09-13 DIAGNOSIS — M545 Low back pain, unspecified: Secondary | ICD-10-CM

## 2020-09-13 DIAGNOSIS — G8929 Other chronic pain: Secondary | ICD-10-CM

## 2020-09-13 MED ORDER — TRAMADOL HCL 50 MG PO TABS: 50.0000 mg | ORAL_TABLET | Freq: Three times a day (TID) | ORAL | 0 refills | Status: AC | PRN

## 2020-09-13 NOTE — Telephone Encounter (Signed)
09/13/2020 - PATIENT STATES SHE SAW KELSEA JUST ON 09/09/2020 FOR BACK PAIN. SHE WAS GIVEN MELOXICAM AND FLEXORIL. NEITHER ONE SEEMS TO BE HELPING. SHE WILL BE HAVING SURGERY ON 09/28/2020 AND OUT OF WORK FOR 6 WEEKS. KELSEA TOLD HER TO GO TO THE ER IF SHE DOES NOT IMPROVE BUT SHE DOESN'T  WANT TO GO THERE AND GET COVID WITH HER UP COMING SURGERY. WHAT ELSE CAN KELSEA SUGGEST? BEST PHONE [phone] (CELL) [place]

## 2020-09-13 NOTE — Telephone Encounter (Signed)
I sent a few days of Tramadol to her pharmacy. Another option would be for her to go to an urgent care and get an injection for pain.

## 2020-09-13 NOTE — Telephone Encounter (Signed)
Please advise see previous message, pt has back pain and upcoming surgery , prefers not to go to ER as recommended.

## 2020-09-13 NOTE — Telephone Encounter (Signed)
Patient informed per previous message , she verbalized understanding.

## 2020-09-16 ENCOUNTER — Ambulatory Visit: Payer: 59 | Admitting: Emergency Medicine

## 2020-09-22 ENCOUNTER — Other Ambulatory Visit: Payer: Self-pay

## 2020-09-22 ENCOUNTER — Encounter (HOSPITAL_BASED_OUTPATIENT_CLINIC_OR_DEPARTMENT_OTHER): Payer: Self-pay | Admitting: Obstetrics & Gynecology

## 2020-09-22 NOTE — Progress Notes (Signed)
YOU ARE SCHEDULED FOR A COVID TEST 09-24-2020  @ 300 PM. THIS TEST MUST BE DONE BEFORE SURGERY. GO TO  [facility] [place], [place], IT IS APPROXIMATELY 2 MINUTES PAST [employer] ON THE RIGHT AND REMAIN IN YOUR CAR, THIS IS A DRIVE UP TEST. ONCE YOUR COVID TEST IS DONE PLEASE FOLLOW ALL THE QUARANTINE  INSTRUCTIONS GIVEN IN YOUR HANDOUT.      Your procedure is scheduled on 09-28-2020  Report to [facility].   Call this number if you have problems the morning of surgery  [phone].   OUR ADDRESS IS [address].  WE ARE LOCATED IN [facility].  PLEASE BRING YOUR INSURANCE CARD AND PHOTO ID DAY OF SURGERY.  ONLY ONE PERSON ALLOWED IN FACILITY WAITING AREA.                                     REMEMBER:  DO NOT EAT FOOD, CANDY GUM OR MINTS  AFTER MIDNIGHT . YOU MAY HAVE CLEAR LIQUIDS FROM MIDNIGHT UNTIL 530 AM. NO CLEAR LIQUIDS AFTER  530 AM DAY OF SURGERY.   YOU MAY  BRUSH YOUR TEETH MORNING OF SURGERY AND RINSE YOUR MOUTH OUT, NO CHEWING GUM CANDY OR MINTS.    CLEAR LIQUID DIET   Foods Allowed                                                                     Foods Excluded  Coffee and tea, regular and decaf                             liquids that you cannot  Plain Jell-O any favor except red or purple                                           see through such as: Fruit ices (not with fruit pulp)                                     milk, soups, orange juice  Iced Popsicles                                    All solid food Carbonated beverages, regular and diet                                    Cranberry, grape and apple juices Sports drinks like Gatorade Lightly seasoned clear broth or consume(fat free) Sugar, honey syrup  Sample Menu Breakfast                                Lunch  Supper Cranberry juice                    Beef broth                            Chicken  broth Jell-O                                     Grape juice                           Apple juice Coffee or tea                        Jell-O                                      Popsicle                                                Coffee or tea                        Coffee or tea  _____________________________________________________________________     TAKE THESE MEDICATIONS MORNING OF SURGERY WITH A SIP OF WATER:   ALPRAZOLAM (XANAX) IF NEED, FAMOTIDINE.  BRING CPAP MASK TUBING AND MACHINE AND LEAVE IN CAR.  ONE VISITOR IS ALLOWED IN WAITING ROOM ONLY DAY OF SURGERY.  NO VISITOR MAY SPEND THE NIGHT.  VISITOR ARE ALLOWED TO STAY UNTIL 800 PM.                                    DO NOT WEAR JEWERLY, MAKE UP. DO NOT WEAR LOTIONS, POWDERS, PERFUMES OR DEODORANT. DO NOT SHAVE FOR 24 HOURS PRIOR TO DAY OF SURGERY. MEN MAY SHAVE FACE AND NECK. CONTACTS, GLASSES, OR DENTURES MAY NOT BE WORN TO SURGERY.                                    [facility] IS NOT RESPONSIBLE  FOR ANY BELONGINGS.                                                                    Marland Kitchen            - Preparing for Surgery Before surgery, you can play an important role.  Because skin is not sterile, your skin needs to be as free of germs as possible.  You can reduce the number of germs on your skin by washing with CHG (chlorahexidine gluconate) soap before surgery.  CHG is an antiseptic cleaner which kills germs and bonds with the skin to continue killing germs even after washing. Please DO NOT use if  you have an allergy to CHG or antibacterial soaps.  If your skin becomes reddened/irritated stop using the CHG and inform your nurse when you arrive at Short Stay. Do not shave (including legs and underarms) for at least 48 hours prior to the first CHG shower.  You may shave your face/neck. Please follow these instructions carefully:  1.  Shower with CHG Soap the night before surgery and the  morning of  Surgery.  2.  If you choose to wash your hair, wash your hair first as usual with your  normal  shampoo.  3.  After you shampoo, rinse your hair and body thoroughly to remove the  shampoo.                            4.  Use CHG as you would any other liquid soap.  You can apply chg directly  to the skin and wash                      Gently with a scrungie or clean washcloth.  5.  Apply the CHG Soap to your body ONLY FROM THE NECK DOWN.   Do not use on face/ open                           Wound or open sores. Avoid contact with eyes, ears mouth and genitals (private parts).                       Wash face,  Genitals (private parts) with your normal soap.             6.  Wash thoroughly, paying special attention to the area where your surgery  will be performed.  7.  Thoroughly rinse your body with warm water from the neck down.  8.  DO NOT shower/wash with your normal soap after using and rinsing off  the CHG Soap.                9.  Pat yourself dry with a clean towel.            10.  Wear clean pajamas.            11.  Place clean sheets on your bed the night of your first shower and do not  sleep with pets. Day of Surgery : Do not apply any lotions/deodorants the morning of surgery.  Please wear clean clothes to the hospital/surgery center.  FAILURE TO FOLLOW THESE INSTRUCTIONS MAY RESULT IN THE CANCELLATION OF YOUR SURGERY PATIENT SIGNATURE_________________________________  NURSE SIGNATURE__________________________________  ________________________________________________________________________                                                        QUESTIONS Theresa Sawyer PRE OP NURSE PHONE [phone]

## 2020-09-22 NOTE — Progress Notes (Addendum)
Spoke w/ via phone for pre-op interview---pt Lab needs dos----    Urine preg           Lab results------see below COVID test ------09-24-2020 1500 Arrive at -------630 am 09-28-2020 NPO after MN NO Solid Food.  Clear liquids from MN until---530 am then npo Med rec completed Medications to take morning of surgery -----alprazolam prn, famotidine Diabetic medication -----n/a Pt instructed to not smoke for 24 hours prior to surgery. Patient instructed to bring photo id and insurance card day of surgery Patient aware to have Driver (ride ) / caregiver  Sign other johhny driver Engineer, civil (consulting) will stay   for 24 hours after surgery  Patient Special Instructions -----bring cpap mask tubing and machine and leave in car Pre-Op special Istructions -----pt given extended stay instructions Patient verbalized understanding of instructions that were given at this phone interview. Patient denies shortness of breath, chest pain, fever, cough at this phone interview.  ekg epic 04-02-2020 epic, lov dr Irish Lack epic, pt states no chest pain or cardiac symptoms in a long time.

## 2020-09-23 ENCOUNTER — Other Ambulatory Visit: Payer: Self-pay

## 2020-09-23 ENCOUNTER — Encounter: Payer: Self-pay | Admitting: Obstetrics & Gynecology

## 2020-09-23 ENCOUNTER — Ambulatory Visit (INDEPENDENT_AMBULATORY_CARE_PROVIDER_SITE_OTHER): Payer: 59 | Admitting: Obstetrics & Gynecology

## 2020-09-23 ENCOUNTER — Ambulatory Visit: Payer: 59 | Admitting: Podiatry

## 2020-09-23 VITALS — BP 134/86

## 2020-09-23 DIAGNOSIS — N938 Other specified abnormal uterine and vaginal bleeding: Secondary | ICD-10-CM | POA: Diagnosis not present

## 2020-09-23 DIAGNOSIS — S96912D Strain of unspecified muscle and tendon at ankle and foot level, left foot, subsequent encounter: Secondary | ICD-10-CM

## 2020-09-23 DIAGNOSIS — M722 Plantar fascial fibromatosis: Secondary | ICD-10-CM

## 2020-09-23 DIAGNOSIS — M779 Enthesopathy, unspecified: Secondary | ICD-10-CM | POA: Diagnosis not present

## 2020-09-23 DIAGNOSIS — N946 Dysmenorrhea, unspecified: Secondary | ICD-10-CM

## 2020-09-23 DIAGNOSIS — S93692A Other sprain of left foot, initial encounter: Secondary | ICD-10-CM

## 2020-09-23 DIAGNOSIS — R102 Pelvic and perineal pain: Secondary | ICD-10-CM | POA: Diagnosis not present

## 2020-09-23 NOTE — Progress Notes (Signed)
    Theresa Sawyer 1980-10-29 [id number]        39 y.o.  [id number]  [id number]   RP: Preop XI Robotic TLH/BSO for Menometrorrhagia/Dysmenorrhea/Chronic Pelvic Pain  HPI:  [facility] Excision of Polyps/D+C Benign Patho 10/2019.  Still experiencing heavy irregular menses with severe dysmenorrhea.  Chronic pelvic pain.  Desires a Hysterectomy.   OB History  Gravida Para Term Preterm AB Living  3 1 1  0 2 1  SAB IAB Ectopic Multiple Live Births  1 1 0 0 1    # Outcome Date GA Lbr Len/2nd Weight Sex Delivery Anes PTL Lv  3 Term 02/04/13 [id number]:35 / 03:55 7 lb 10.8 oz (3.481 kg) M Vag-Spont EPI  LIV     Birth Comments: WNL  2 SAB         DEC  1 IAB         DEC    Past medical history,surgical history, problem list, medications, allergies, family history and social history were all reviewed and documented in the EPIC chart.   Directed ROS with pertinent positives and negatives documented in the history of present illness/assessment and plan.  Exam:  Vitals:   09/23/20 1324  BP: 134/86   General appearance:  Normal  Pelvic [place]/20[place]: Uterus 8.6x6.0 x 5.1 cm, anteverted orientation No myometrial masses Endometrium significantly thick at 21 mm  3D imaging indicates several endometrial polyps likely present Bilateral ovaries normal, normal follicle patter No adnexal masses No free fluid.   Assessment/Plan:  40y.o. [id number]   1. Pelvic pain in female Chronic pelvic pain with severe dysmenorrhea with menometrorrhagia.  HSC/Excision of Polyp/D+C 10/2019, benign pathology. Decision to proceed with XI Robotic TLH/BSO.   2. Severe dysmenorrhea Severe persistent dysmenorrhea.  3. DUB (dysfunctional uterine bleeding) H/O Endometrial Polyps.  Menometrorrhagia persisting post excision of Polyps.                        Patient was counseled as to the risk of surgery to include the following:  1. Infection (prohylactic antibiotics will be administered)  2. DVT/Pulmonary Embolism  (prophylactic pneumo compression stockings will be used)  3.Trauma to internal organs requiring additional surgical procedure to repair any injury to internal organs requiring perhaps additional hospitalization days.  4.Hemmorhage requiring transfusion and blood products which carry risks such as anaphylactic reaction, hepatitis and AIDS  Patient had received literature information on the procedure scheduled and all her questions were answered and fully accepts all risk.   MPrincess BruinsMD, 1:45 PM 09/23/2020

## 2020-09-24 ENCOUNTER — Encounter (HOSPITAL_COMMUNITY)
Admission: RE | Admit: 2020-09-24 | Discharge: 2020-09-24 | Disposition: A | Discharge status: Home / Self Care | Payer: 59 | Source: Ambulatory Visit | Attending: Obstetrics & Gynecology | Admitting: Obstetrics & Gynecology

## 2020-09-24 ENCOUNTER — Other Ambulatory Visit (HOSPITAL_COMMUNITY)
Admission: RE | Admit: 2020-09-24 | Discharge: 2020-09-24 | Disposition: A | Discharge status: Home / Self Care | Payer: 59 | Source: Ambulatory Visit | Attending: Obstetrics & Gynecology | Admitting: Obstetrics & Gynecology

## 2020-09-24 DIAGNOSIS — Z01812 Encounter for preprocedural laboratory examination: Secondary | ICD-10-CM | POA: Insufficient documentation

## 2020-09-24 DIAGNOSIS — Z20822 Contact with and (suspected) exposure to covid-19: Secondary | ICD-10-CM | POA: Insufficient documentation

## 2020-09-24 LAB — BASIC METABOLIC PANEL
Anion gap: 7 (ref 5–15)
BUN: 11 mg/dL (ref 6–20)
CO2: 25 mmol/L (ref 22–32)
Calcium: 9.4 mg/dL (ref 8.9–10.3)
Chloride: 104 mmol/L (ref 98–111)
Creatinine, Ser: 0.62 mg/dL (ref 0.44–1.00)
GFR, Estimated: >60 mL/min (ref >60)
Glucose, Bld: 76 mg/dL (ref 70–99)
Potassium: 3.7 mmol/L (ref 3.5–5.1)
Sodium: 136 mmol/L (ref 135–145)

## 2020-09-24 LAB — CBC
HCT: 44.7 % (ref 36.0–46.0)
Hemoglobin: 15 g/dL (ref 12.0–15.0)
MCH: 30.2 pg (ref 26.0–34.0)
MCHC: 33.6 g/dL (ref 30.0–36.0)
MCV: 90.1 fL (ref 80.0–100.0)
Platelets: 387 10*3/uL (ref 150–400)
RBC: 4.96 MIL/uL (ref 3.87–5.11)
RDW: 15.6 % — ABNORMAL HIGH (ref 11.5–15.5)
WBC: 13.8 10*3/uL — ABNORMAL HIGH (ref 4.0–10.5)
nRBC: 0 % (ref 0.0–0.2)

## 2020-09-25 LAB — SARS CORONAVIRUS 2 (TAT 6-24 HRS): SARS Coronavirus 2: NEGATIVE

## 2020-09-25 NOTE — Progress Notes (Signed)
Subjective: 40 year old female presents the office today for follow-up evaluation of left heel pain, plantar fasciitis as well as ankle discomfort.  She states that she is continued have discomfort she states that she is feeling the same.  No recent injury or falls.  She does get occasional swelling to the ankle.  She has tried inserts, stretching, icing as well as injection therapy.  She does get pain when she first gets up but also after being on her feet. Denies any systemic complaints such as fevers, chills, nausea, vomiting. No acute changes since last appointment, and no other complaints at this time.   Objective: AAO x3, NAD DP/PT pulses palpable bilaterally, CRT less than 3 seconds Majority of tenderness is localized along the plantar medial tubercle of the calcaneus at the insertion of plantar fascia.  The plantar fascia appears to be intact.  There is no pain with lateral compression of calcaneus.  There is no edema, erythema.  Negative Tinel sign.  There is also diffuse tenderness to the ankle mostly in the course of the flexor tendon, posterior tibial tendon as well as the peroneal tendon.  Tendons appear to be intact.  No pain in the Achilles tendon.  MMT 5/5. No pain with calf compression, swelling, warmth, erythema  Assessment: Rule out plantar fascial tear, partial tear of peroneal, posterior tibial tendon  Plan: -All treatment options discussed with the patient including all alternatives, risks, complications.  -Given the ongoing symptoms and continued nature of the discomfort I recommend MRI which is ordered also potential surgical planning.  Also place her into a cam boot today.  She is going to have a hysterectomy next week so we hold off any medications today. -Patient encouraged to call the office with any questions, concerns, change in symptoms.   Trula Slade DPM

## 2020-09-27 NOTE — Anesthesia Preprocedure Evaluation (Addendum)
Anesthesia Evaluation  Patient identified by MRN, date of birth, ID band Patient awake    Reviewed: Allergy & Precautions, NPO status , Patient's Chart, lab work & pertinent test results  History of Anesthesia Complications Negative for: history of anesthetic complications  Airway Mallampati: II       Dental  (+) Chipped   Pulmonary sleep apnea and Continuous Positive Airway Pressure Ventilation , Current Smoker and Patient abstained from smoking.,  04/03/2020 SARS coronavirus NEG   Pulmonary exam normal        Cardiovascular hypertension, Pt. on medications Normal cardiovascular exam     Neuro/Psych  Headaches, PSYCHIATRIC DISORDERS Anxiety Depression negative neurological ROS     GI/Hepatic GERD  Medicated and Controlled,Intermittently elevated LFTs from [facility]   Endo/Other  Morbid obesity  Renal/GU negative Renal ROS     Musculoskeletal  (+) Arthritis  (psoriatic), Osteoarthritis,    Abdominal (+) + obese,   Peds  Hematology   Anesthesia Other Findings   Reproductive/Obstetrics                            Anesthesia Physical  Anesthesia Plan  ASA: III  Anesthesia Plan: General   Post-op Pain Management:    Induction: Intravenous  PONV Risk Score and Plan: 4 or greater and Ondansetron, Dexamethasone, Scopolamine patch - Pre-op and Midazolam  Airway Management Planned: Oral ETT  Additional Equipment: None  Intra-op Plan:   Post-operative Plan: Extubation in OR  Informed Consent: I have reviewed the patients History and Physical, chart, labs and discussed the procedure including the risks, benefits and alternatives for the proposed anesthesia with the patient or authorized representative who has indicated his/her understanding and acceptance.     Dental advisory given  Plan Discussed with: CRNA  Anesthesia Plan Comments:        Anesthesia Quick Evaluation

## 2020-09-28 ENCOUNTER — Encounter (HOSPITAL_BASED_OUTPATIENT_CLINIC_OR_DEPARTMENT_OTHER): Payer: Self-pay | Admitting: Obstetrics & Gynecology

## 2020-09-28 ENCOUNTER — Other Ambulatory Visit: Payer: Self-pay

## 2020-09-28 ENCOUNTER — Ambulatory Visit (HOSPITAL_BASED_OUTPATIENT_CLINIC_OR_DEPARTMENT_OTHER): Payer: 59 | Admitting: Anesthesiology

## 2020-09-28 ENCOUNTER — Ambulatory Visit (HOSPITAL_BASED_OUTPATIENT_CLINIC_OR_DEPARTMENT_OTHER)
Admission: RE | Admit: 2020-09-28 | Discharge: 2020-09-28 | Disposition: A | Discharge status: Home / Self Care | Payer: 59 | Attending: Obstetrics & Gynecology | Admitting: Obstetrics & Gynecology

## 2020-09-28 ENCOUNTER — Encounter (HOSPITAL_BASED_OUTPATIENT_CLINIC_OR_DEPARTMENT_OTHER)
Admission: RE | Disposition: A | Discharge status: Home / Self Care | Payer: Self-pay | Source: Home / Self Care | Attending: Obstetrics & Gynecology

## 2020-09-28 DIAGNOSIS — Z888 Allergy status to other drugs, medicaments and biological substances status: Secondary | ICD-10-CM | POA: Diagnosis not present

## 2020-09-28 DIAGNOSIS — D251 Intramural leiomyoma of uterus: Secondary | ICD-10-CM | POA: Insufficient documentation

## 2020-09-28 DIAGNOSIS — R102 Pelvic and perineal pain: Secondary | ICD-10-CM | POA: Diagnosis not present

## 2020-09-28 DIAGNOSIS — N946 Dysmenorrhea, unspecified: Secondary | ICD-10-CM | POA: Diagnosis not present

## 2020-09-28 DIAGNOSIS — N921 Excessive and frequent menstruation with irregular cycle: Secondary | ICD-10-CM | POA: Insufficient documentation

## 2020-09-28 DIAGNOSIS — N941 Unspecified dyspareunia: Secondary | ICD-10-CM | POA: Diagnosis not present

## 2020-09-28 DIAGNOSIS — D259 Leiomyoma of uterus, unspecified: Secondary | ICD-10-CM | POA: Diagnosis not present

## 2020-09-28 DIAGNOSIS — N838 Other noninflammatory disorders of ovary, fallopian tube and broad ligament: Secondary | ICD-10-CM | POA: Insufficient documentation

## 2020-09-28 DIAGNOSIS — Z8249 Family history of ischemic heart disease and other diseases of the circulatory system: Secondary | ICD-10-CM | POA: Diagnosis not present

## 2020-09-28 DIAGNOSIS — N920 Excessive and frequent menstruation with regular cycle: Secondary | ICD-10-CM | POA: Diagnosis not present

## 2020-09-28 DIAGNOSIS — F17210 Nicotine dependence, cigarettes, uncomplicated: Secondary | ICD-10-CM | POA: Diagnosis not present

## 2020-09-28 DIAGNOSIS — N800 Endometriosis of uterus: Secondary | ICD-10-CM | POA: Diagnosis not present

## 2020-09-28 DIAGNOSIS — Z9889 Other specified postprocedural states: Secondary | ICD-10-CM | POA: Diagnosis present

## 2020-09-28 DIAGNOSIS — Z833 Family history of diabetes mellitus: Secondary | ICD-10-CM | POA: Diagnosis not present

## 2020-09-28 DIAGNOSIS — N8300 Follicular cyst of ovary, unspecified side: Secondary | ICD-10-CM | POA: Diagnosis not present

## 2020-09-28 HISTORY — DX: Excessive and frequent menstruation with regular cycle: N92.0

## 2020-09-28 HISTORY — DX: Essential (primary) hypertension: I10

## 2020-09-28 HISTORY — DX: Migraine, unspecified, not intractable, without status migrainosus: G43.909

## 2020-09-28 HISTORY — PX: ROBOTIC ASSISTED TOTAL HYSTERECTOMY WITH BILATERAL SALPINGO OOPHERECTOMY: SHX6086

## 2020-09-28 HISTORY — DX: Frequency of micturition: R35.0

## 2020-09-28 LAB — CBC WITH DIFFERENTIAL/PLATELET
Abs Immature Granulocytes: 0.12 10*3/uL — ABNORMAL HIGH (ref 0.00–0.07)
Basophils Absolute: 0.2 10*3/uL — ABNORMAL HIGH (ref 0.0–0.1)
Basophils Relative: 1 %
Eosinophils Absolute: 0.5 10*3/uL (ref 0.0–0.5)
Eosinophils Relative: 4 %
HCT: 44.3 % (ref 36.0–46.0)
Hemoglobin: 14.9 g/dL (ref 12.0–15.0)
Immature Granulocytes: 1 %
Lymphocytes Relative: 26 %
Lymphs Abs: 3.4 10*3/uL (ref 0.7–4.0)
MCH: 30.3 pg (ref 26.0–34.0)
MCHC: 33.6 g/dL (ref 30.0–36.0)
MCV: 90 fL (ref 80.0–100.0)
Monocytes Absolute: 1.1 10*3/uL — ABNORMAL HIGH (ref 0.1–1.0)
Monocytes Relative: 8 %
Neutro Abs: 8.1 10*3/uL — ABNORMAL HIGH (ref 1.7–7.7)
Neutrophils Relative %: 60 %
Platelets: 352 10*3/uL (ref 150–400)
RBC: 4.92 MIL/uL (ref 3.87–5.11)
RDW: 15.6 % — ABNORMAL HIGH (ref 11.5–15.5)
WBC: 13.2 10*3/uL — ABNORMAL HIGH (ref 4.0–10.5)
nRBC: 0 % (ref 0.0–0.2)

## 2020-09-28 LAB — CBC
HCT: 42.3 % (ref 36.0–46.0)
Hemoglobin: 14.1 g/dL (ref 12.0–15.0)
MCH: 30.2 pg (ref 26.0–34.0)
MCHC: 33.3 g/dL (ref 30.0–36.0)
MCV: 90.6 fL (ref 80.0–100.0)
Platelets: 325 10*3/uL (ref 150–400)
RBC: 4.67 MIL/uL (ref 3.87–5.11)
RDW: 15.9 % — ABNORMAL HIGH (ref 11.5–15.5)
WBC: 21.7 10*3/uL — ABNORMAL HIGH (ref 4.0–10.5)
nRBC: 0 % (ref 0.0–0.2)

## 2020-09-28 LAB — TYPE AND SCREEN
ABO/RH(D): O NEG
Antibody Screen: NEGATIVE

## 2020-09-28 LAB — POCT PREGNANCY, URINE: Preg Test, Ur: NEGATIVE

## 2020-09-28 SURGERY — HYSTERECTOMY, TOTAL, ROBOT-ASSISTED, LAPAROSCOPIC, WITH BILATERAL SALPINGO-OOPHORECTOMY
Anesthesia: General | Laterality: Bilateral

## 2020-09-28 MED ORDER — LACTATED RINGERS IV SOLN: INTRAVENOUS | Status: DC

## 2020-09-28 MED ORDER — LIDOCAINE 2% (20 MG/ML) 5 ML SYRINGE
INTRAMUSCULAR | Status: DC | PRN
  Administered 2020-09-28: 1.5 mg/kg/h via INTRAVENOUS

## 2020-09-28 MED ORDER — WHITE PETROLATUM EX OINT
TOPICAL_OINTMENT | CUTANEOUS | Status: AC
  Filled 2020-09-28: qty 5

## 2020-09-28 MED ORDER — SCOPOLAMINE 1 MG/3DAYS TD PT72
MEDICATED_PATCH | TRANSDERMAL | Status: AC
  Filled 2020-09-28: qty 1

## 2020-09-28 MED ORDER — FENTANYL CITRATE (PF) 100 MCG/2ML IJ SOLN
INTRAMUSCULAR | Status: DC | PRN
  Administered 2020-09-28 (×5): 50 ug via INTRAVENOUS

## 2020-09-28 MED ORDER — MIDAZOLAM HCL 2 MG/2ML IJ SOLN
INTRAMUSCULAR | Status: AC
  Filled 2020-09-28: qty 2

## 2020-09-28 MED ORDER — OXYCODONE-ACETAMINOPHEN 5-325 MG PO TABS
2.0000 | ORAL_TABLET | ORAL | Status: DC | PRN
  Administered 2020-09-28: 2 via ORAL

## 2020-09-28 MED ORDER — ROCURONIUM BROMIDE 10 MG/ML (PF) SYRINGE
PREFILLED_SYRINGE | INTRAVENOUS | Status: AC
  Filled 2020-09-28: qty 10

## 2020-09-28 MED ORDER — KETOROLAC TROMETHAMINE 30 MG/ML IJ SOLN
INTRAMUSCULAR | Status: AC
  Filled 2020-09-28: qty 1

## 2020-09-28 MED ORDER — HYDROMORPHONE HCL 1 MG/ML IJ SOLN
0.2500 mg | INTRAMUSCULAR | Status: DC | PRN
Start: 2020-09-28 — End: 2020-09-28
  Administered 2020-09-28 (×2): 0.25 mg via INTRAVENOUS

## 2020-09-28 MED ORDER — KETOROLAC TROMETHAMINE 30 MG/ML IJ SOLN
INTRAMUSCULAR | Status: DC | PRN
  Administered 2020-09-28: 30 mg via INTRAVENOUS

## 2020-09-28 MED ORDER — ROCURONIUM BROMIDE 10 MG/ML (PF) SYRINGE
PREFILLED_SYRINGE | INTRAVENOUS | Status: DC | PRN
  Administered 2020-09-28: 80 mg via INTRAVENOUS
  Administered 2020-09-28: 20 mg via INTRAVENOUS
  Administered 2020-09-28: 10 mg via INTRAVENOUS

## 2020-09-28 MED ORDER — DEXAMETHASONE SODIUM PHOSPHATE 10 MG/ML IJ SOLN
INTRAMUSCULAR | Status: AC
  Filled 2020-09-28: qty 1

## 2020-09-28 MED ORDER — FENTANYL CITRATE (PF) 100 MCG/2ML IJ SOLN
INTRAMUSCULAR | Status: AC
  Filled 2020-09-28: qty 2

## 2020-09-28 MED ORDER — LIDOCAINE 2% (20 MG/ML) 5 ML SYRINGE
INTRAMUSCULAR | Status: AC
  Filled 2020-09-28: qty 5

## 2020-09-28 MED ORDER — OXYCODONE-ACETAMINOPHEN 7.5-325 MG PO TABS: 1.0000 | ORAL_TABLET | Freq: Four times a day (QID) | ORAL | 0 refills | Status: DC | PRN

## 2020-09-28 MED ORDER — HYDROCODONE-ACETAMINOPHEN 5-325 MG PO TABS: 1.0000 | ORAL_TABLET | ORAL | Status: DC | PRN

## 2020-09-28 MED ORDER — BUPIVACAINE HCL (PF) 0.25 % IJ SOLN
INTRAMUSCULAR | Status: DC | PRN
  Administered 2020-09-28: 15 mL

## 2020-09-28 MED ORDER — DEXAMETHASONE SODIUM PHOSPHATE 10 MG/ML IJ SOLN
INTRAMUSCULAR | Status: DC | PRN
  Administered 2020-09-28 (×2): 5 mg via INTRAVENOUS

## 2020-09-28 MED ORDER — ACETAMINOPHEN 500 MG PO TABS
ORAL_TABLET | ORAL | Status: AC
  Filled 2020-09-28: qty 2

## 2020-09-28 MED ORDER — POVIDONE-IODINE 10 % EX SWAB: 2.0000 "application " | Freq: Once | CUTANEOUS | Status: DC

## 2020-09-28 MED ORDER — EPHEDRINE SULFATE-NACL 50-0.9 MG/10ML-% IV SOSY
PREFILLED_SYRINGE | INTRAVENOUS | Status: DC | PRN
  Administered 2020-09-28: 20 mg via INTRAVENOUS
  Administered 2020-09-28: 15 mg via INTRAVENOUS

## 2020-09-28 MED ORDER — ACETAMINOPHEN 325 MG PO TABS: 650.0000 mg | ORAL_TABLET | ORAL | Status: DC | PRN

## 2020-09-28 MED ORDER — DEXMEDETOMIDINE (PRECEDEX) IN NS 20 MCG/5ML (4 MCG/ML) IV SYRINGE
PREFILLED_SYRINGE | INTRAVENOUS | Status: DC | PRN
  Administered 2020-09-28 (×4): 4 ug via INTRAVENOUS

## 2020-09-28 MED ORDER — OXYCODONE-ACETAMINOPHEN 5-325 MG PO TABS
ORAL_TABLET | ORAL | Status: AC
  Filled 2020-09-28: qty 2

## 2020-09-28 MED ORDER — ONDANSETRON HCL 4 MG/2ML IJ SOLN
INTRAMUSCULAR | Status: DC | PRN
  Administered 2020-09-28: 4 mg via INTRAVENOUS

## 2020-09-28 MED ORDER — PROMETHAZINE HCL 25 MG/ML IJ SOLN: 6.2500 mg | INTRAMUSCULAR | Status: DC | PRN

## 2020-09-28 MED ORDER — SCOPOLAMINE 1 MG/3DAYS TD PT72
MEDICATED_PATCH | TRANSDERMAL | Status: DC | PRN
  Administered 2020-09-28: 1 via TRANSDERMAL

## 2020-09-28 MED ORDER — EPHEDRINE 5 MG/ML INJ
INTRAVENOUS | Status: AC
  Filled 2020-09-28: qty 10

## 2020-09-28 MED ORDER — MIDAZOLAM HCL 2 MG/2ML IJ SOLN
INTRAMUSCULAR | Status: DC | PRN
  Administered 2020-09-28: 2 mg via INTRAVENOUS

## 2020-09-28 MED ORDER — MEPERIDINE HCL 25 MG/ML IJ SOLN: 6.2500 mg | INTRAMUSCULAR | Status: DC | PRN

## 2020-09-28 MED ORDER — ONDANSETRON HCL 4 MG/2ML IJ SOLN
INTRAMUSCULAR | Status: AC
  Filled 2020-09-28: qty 2

## 2020-09-28 MED ORDER — DEXMEDETOMIDINE (PRECEDEX) IN NS 20 MCG/5ML (4 MCG/ML) IV SYRINGE
PREFILLED_SYRINGE | INTRAVENOUS | Status: AC
  Filled 2020-09-28: qty 5

## 2020-09-28 MED ORDER — HYDROMORPHONE HCL 1 MG/ML IJ SOLN
INTRAMUSCULAR | Status: AC
  Filled 2020-09-28: qty 1

## 2020-09-28 MED ORDER — PROPOFOL 10 MG/ML IV BOLUS
INTRAVENOUS | Status: AC
  Filled 2020-09-28: qty 40

## 2020-09-28 MED ORDER — CEFAZOLIN SODIUM-DEXTROSE 2-4 GM/100ML-% IV SOLN
2.0000 g | INTRAVENOUS | Status: AC
  Administered 2020-09-28: 2 g via INTRAVENOUS

## 2020-09-28 MED ORDER — PROPOFOL 10 MG/ML IV BOLUS
INTRAVENOUS | Status: DC | PRN
  Administered 2020-09-28: 300 mg via INTRAVENOUS

## 2020-09-28 MED ORDER — CEFAZOLIN SODIUM-DEXTROSE 2-4 GM/100ML-% IV SOLN
INTRAVENOUS | Status: AC
  Filled 2020-09-28: qty 100

## 2020-09-28 MED ORDER — ACETAMINOPHEN 10 MG/ML IV SOLN
1000.0000 mg | Freq: Once | INTRAVENOUS | Status: DC | PRN
Start: 2020-09-28 — End: 2020-09-28

## 2020-09-28 MED ORDER — SUGAMMADEX SODIUM 500 MG/5ML IV SOLN
INTRAVENOUS | Status: AC
  Filled 2020-09-28: qty 5

## 2020-09-28 MED ORDER — LIDOCAINE 2% (20 MG/ML) 5 ML SYRINGE
INTRAMUSCULAR | Status: DC | PRN
  Administered 2020-09-28: 100 mg via INTRAVENOUS

## 2020-09-28 MED ORDER — SUGAMMADEX SODIUM 200 MG/2ML IV SOLN
INTRAVENOUS | Status: DC | PRN
  Administered 2020-09-28: 250 mg via INTRAVENOUS

## 2020-09-28 MED ORDER — ACETAMINOPHEN 325 MG PO TABS
ORAL_TABLET | ORAL | Status: DC | PRN
  Administered 2020-09-28: 1000 mg via ORAL

## 2020-09-28 SURGICAL SUPPLY — 66 items
ADH SKN CLS APL DERMABOND .7 (GAUZE/BANDAGES/DRESSINGS) ×1
BARRIER ADHS 3X4 INTERCEED (GAUZE/BANDAGES/DRESSINGS) IMPLANT
BRR ADH 4X3 ABS CNTRL BYND (GAUZE/BANDAGES/DRESSINGS)
CANISTER SUCT 3000ML PPV (MISCELLANEOUS) ×2 IMPLANT
CATH FOLEY 3WAY  5CC 16FR (CATHETERS) ×2
CATH FOLEY 3WAY 5CC 16FR (CATHETERS) ×1 IMPLANT
COVER BACK TABLE 60X90IN (DRAPES) ×2 IMPLANT
COVER TIP SHEARS 8 DVNC (MISCELLANEOUS) ×1 IMPLANT
COVER TIP SHEARS 8MM DA VINCI (MISCELLANEOUS) ×2
COVER WAND RF STERILE (DRAPES) ×2 IMPLANT
DECANTER SPIKE VIAL GLASS SM (MISCELLANEOUS) IMPLANT
DEFOGGER SCOPE WARMER CLEARIFY (MISCELLANEOUS) ×2 IMPLANT
DERMABOND ADVANCED (GAUZE/BANDAGES/DRESSINGS) ×1
DERMABOND ADVANCED .7 DNX12 (GAUZE/BANDAGES/DRESSINGS) ×1 IMPLANT
DRAPE ARM DVNC X/XI (DISPOSABLE) ×4 IMPLANT
DRAPE COLUMN DVNC XI (DISPOSABLE) ×1 IMPLANT
DRAPE DA VINCI XI ARM (DISPOSABLE) ×8
DRAPE DA VINCI XI COLUMN (DISPOSABLE) ×2
DRAPE UTILITY XL STRL (DRAPES) ×2 IMPLANT
DURAPREP 26ML APPLICATOR (WOUND CARE) ×2 IMPLANT
ELECT REM PT RETURN 9FT ADLT (ELECTROSURGICAL) ×2
ELECTRODE REM PT RTRN 9FT ADLT (ELECTROSURGICAL) ×1 IMPLANT
GAUZE PETROLATUM 1 X8 (GAUZE/BANDAGES/DRESSINGS) ×2 IMPLANT
GLOVE SRG 8 PF TXTR STRL LF DI (GLOVE) IMPLANT
GLOVE SURG ENC MOIS LTX SZ6.5 (GLOVE) ×6 IMPLANT
GLOVE SURG ENC MOIS LTX SZ8 (GLOVE) IMPLANT
GLOVE SURG UNDER POLY LF SZ7 (GLOVE) ×10 IMPLANT
GLOVE SURG UNDER POLY LF SZ8 (GLOVE)
GOWN STRL REUS W/TWL XL LVL3 (GOWN DISPOSABLE) IMPLANT
HOLDER FOLEY CATH W/STRAP (MISCELLANEOUS) IMPLANT
IRRIG SUCT STRYKERFLOW 2 WTIP (MISCELLANEOUS) ×2
IRRIGATION SUCT STRKRFLW 2 WTP (MISCELLANEOUS) ×1 IMPLANT
KIT TURNOVER CYSTO (KITS) ×2 IMPLANT
LEGGING LITHOTOMY PAIR STRL (DRAPES) ×2 IMPLANT
OBTURATOR OPTICAL STANDARD 8MM (TROCAR) ×2
OBTURATOR OPTICAL STND 8 DVNC (TROCAR) ×1
OBTURATOR OPTICALSTD 8 DVNC (TROCAR) ×1 IMPLANT
OCCLUDER COLPOPNEUMO (BALLOONS) ×2 IMPLANT
PACK ROBOT WH (CUSTOM PROCEDURE TRAY) ×2 IMPLANT
PACK ROBOTIC GOWN (GOWN DISPOSABLE) ×2 IMPLANT
PACK TRENDGUARD 450 HYBRID PRO (MISCELLANEOUS) IMPLANT
PAD OB MATERNITY 4.3X12.25 (PERSONAL CARE ITEMS) ×2 IMPLANT
PAD PREP 24X48 CUFFED NSTRL (MISCELLANEOUS) ×2 IMPLANT
POUCH ENDO CATCH II 15MM (MISCELLANEOUS) IMPLANT
PROTECTOR NERVE ULNAR (MISCELLANEOUS) ×4 IMPLANT
RTRCTR WOUND ALEXIS 18CM SML (INSTRUMENTS)
SAVER CELL AAL HAEMONETICS (INSTRUMENTS) IMPLANT
SEAL CANN UNIV 5-8 DVNC XI (MISCELLANEOUS) ×4 IMPLANT
SEAL XI 5MM-8MM UNIVERSAL (MISCELLANEOUS) ×8
SET IRRIG Y TYPE TUR BLADDER L (SET/KITS/TRAYS/PACK) IMPLANT
SET TRI-LUMEN FLTR TB AIRSEAL (TUBING) ×2 IMPLANT
SUT ETHIBOND 0 (SUTURE) IMPLANT
SUT VIC AB 4-0 PS2 27 (SUTURE) ×6 IMPLANT
SUT VICRYL 0 UR6 27IN ABS (SUTURE) ×2 IMPLANT
SUT VLOC 180 0 9IN  GS21 (SUTURE) ×2
SUT VLOC 180 0 9IN GS21 (SUTURE) ×1 IMPLANT
SUT VLOC 180 2-0 6IN GS21 (SUTURE) IMPLANT
TIP RUMI ORANGE 6.7MMX12CM (TIP) IMPLANT
TIP UTERINE 5.1X6CM LAV DISP (MISCELLANEOUS) IMPLANT
TIP UTERINE 6.7X10CM GRN DISP (MISCELLANEOUS) IMPLANT
TIP UTERINE 6.7X6CM WHT DISP (MISCELLANEOUS) IMPLANT
TIP UTERINE 6.7X8CM BLUE DISP (MISCELLANEOUS) IMPLANT
TOWEL OR 17X26 10 PK STRL BLUE (TOWEL DISPOSABLE) ×2 IMPLANT
TRENDGUARD 450 HYBRID PRO PACK (MISCELLANEOUS)
TROCAR PORT AIRSEAL 5X120 (TROCAR) ×2 IMPLANT
WATER STERILE IRR 1000ML POUR (IV SOLUTION) ×2 IMPLANT

## 2020-09-28 NOTE — Discharge Instructions (Signed)
Total Laparoscopic Hysterectomy, Care After The following information offers guidance on how to care for yourself after your procedure. Your health care provider may also give you more specific instructions. If you have problems or questions, contact your health care provider. What can I expect after the procedure? After the procedure, it is common to have:  Pain, bruising, and numbness around your incisions.  Tiredness (fatigue).  Poor appetite.  Less interest in sex.  Vaginal discharge or bleeding. You will need to use a sanitary pad after this procedure.  Feelings of sadness or other emotions. If your ovaries were also removed, it is also common to have symptoms of menopause, such as hot flashes, night sweats, and lack of sleep (insomnia). Follow these instructions at home: Medicines  Take over-the-counter and prescription medicines only as told by your health care provider.  Ask your health care provider if the medicine prescribed to you: ? Requires you to avoid driving or using machinery. ? Can cause constipation. You may need to take these actions to prevent or treat constipation:  Drink enough fluid to keep your urine pale yellow.  Take over-the-counter or prescription medicines.  Eat foods that are high in fiber, such as beans, whole grains, and fresh fruits and vegetables.  Limit foods that are high in fat and processed sugars, such as fried or sweet foods. Incision care  Follow instructions from your health care provider about how to take care of your incisions. Make sure you: ? Wash your hands with soap and water for at least 20 seconds before and after you change your bandage (dressing). If soap and water are not available, use hand sanitizer. ? Change your dressing as told by your health care provider. ? Leave stitches (sutures), skin glue, or adhesive strips in place. These skin closures may need to stay in place for 2 weeks or longer. If adhesive strip edges start  to loosen and curl up, you may trim the loose edges. Do not remove adhesive strips completely unless your health care provider tells you to do that.  Check your incision areas every day for signs of infection. Check for: ? More redness, swelling, or pain. ? Fluid or blood. ? Warmth. ? Pus or a bad smell.   Activity  Rest as told by your health care provider.  Avoid sitting for a long time without moving. Get up to take short walks every 1-2 hours. This is important to improve blood flow and breathing. Ask for help if you feel weak or unsteady.  Return to your normal activities as told by your health care provider. Ask your health care provider what activities are safe for you.  Do not lift anything that is heavier than 10 lb (4.5 kg), or the limit that you are told, for one month after surgery or until your health care provider says that it is safe.  If you were given a sedative during the procedure, it can affect you for several hours. Do not drive or operate machinery until your health care provider says that it is safe.   Lifestyle  Do not use any products that contain nicotine or tobacco. These products include cigarettes, chewing tobacco, and vaping devices, such as e-cigarettes. These can delay healing after surgery. If you need help quitting, ask your health care provider.  Do not drink alcohol until your health care provider approves. General instructions  Do not douche, use tampons, or have sex for at least 6 weeks, or as told by your health  care provider.  If you struggle with physical or emotional changes after your procedure, speak with your health care provider or a therapist.  Do not take baths, swim, or use a hot tub until your health care provider approves. You may only be allowed to take showers for 2-3 weeks.  Keep your dressing dry until your health care provider says it can be removed.  Try to have someone at home with you for the first 1-2 weeks to help with your  daily chores.  Wear compression stockings as told by your health care provider. These stockings help to prevent blood clots and reduce swelling in your legs.  Keep all follow-up visits. This is important.   Contact a health care provider if:  You have any of these signs of infection: ? Chills or a fever. ? More redness, swelling, or pain around an incision. ? Fluid or blood coming from an incision. ? Warmth coming from an incision. ? Pus or a bad smell coming from an incision.  An incision opens.  You feel dizzy or light-headed.  You have pain or bleeding when you urinate, or you are unable to urinate.  You have abnormal vaginal discharge.  You have pain that does not get better with medicine. Get help right away if:  You have a fever and your symptoms suddenly get worse.  You have severe abdominal pain.  You have chest pain or shortness of breath.  You faint.  You have pain, swelling, or redness in your leg.  You have heavy vaginal bleeding with blood clots, soaking through a sanitary pad in less than 1 hour. These symptoms may represent a serious problem that is an emergency. Do not wait to see if the symptoms will go away. Get medical help right away. Call your local emergency services (911 in the [place] Do not drive yourself to the hospital. Summary  After the procedure, it is common to have pain and bruising around your incisions.  Do not take baths, swim, or use a hot tub until your health care provider approves.  Do not lift anything that is heavier than 10 lb (4.5 kg), or the limit that you are told, for one month after surgery or until your health care provider says that it is safe.  Tell your health care provider if you have any signs or symptoms of infection after the procedure.  Get help right away if you have severe abdominal pain, chest pain, shortness of breath, or heavy bleeding from your vagina. This information is not intended to replace advice given  to you by your health care provider. Make sure you discuss any questions you have with your health care provider. Document Revised: 02/27/2020 Document Reviewed: 02/27/2020 Elsevier Patient Education  [place].

## 2020-09-28 NOTE — Discharge Summary (Signed)
Physician Discharge Summary  Patient ID: Theresa Sawyer MRN: [id number] DOB/AGE: [birth year] 40 y.o.  Admit date: 09/28/2020 Discharge date: 09/28/2020  Admission Diagnoses: Postoperative state [Z98.890]   Discharge Diagnoses:  Active Problems:   Postoperative state   Discharged Condition: good  Consults:None  Significant Diagnostic Studies: labs: Hb postop 14.1  Treatments:surgery: XI Robotic Total Laparoscopic Hysterectomy with Bilateral Salpingo-Oophorectomy  Vitals:   09/28/20 1400 09/28/20 1559  BP: 124/75 128/75  Pulse: 89 95  Resp:    Temp: 97.6 F (36.4 C) 97.6 F (36.4 C)  SpO2: 96% 94%     Total I/O In: 1815 [P.O.:240; I.V.:1475; IV Piggyback:100] Out: 630 [Urine:600; Blood:30]   Hospital Course: Good  Discharge Exam: Good per nursing  Disposition: D/C Home     Allergies as of 09/28/2020      Reactions   Nsaids    Do not take per rheumatologist      Medication List    TAKE these medications   ALPRAZolam 0.5 MG tablet Commonly known as: XANAX TAKE 1 TABLET BY MOUTH EVERY DAY AS NEEDED FOR ANXIETY   cyclobenzaprine 10 MG tablet Commonly known as: FLEXERIL Take 1 tablet (10 mg total) by mouth 3 (three) times daily as needed for muscle spasms.   diclofenac Sodium 1 % Gel Commonly known as: Voltaren Apply 2 g topically 4 (four) times daily. What changed: when to take this   escitalopram 20 MG tablet Commonly known as: LEXAPRO TAKE 1 TABLET(20 MG) BY MOUTH DAILY What changed: See the new instructions.   famotidine 40 MG tablet Commonly known as: PEPCID TAKE 1 TABLET(40 MG) BY MOUTH TWICE DAILY   hydrochlorothiazide 25 MG tablet Commonly known as: HYDRODIURIL Take 1 tablet (25 mg total) by mouth daily.   meloxicam 15 MG tablet Commonly known as: MOBIC Take 1 tablet (15 mg total) by mouth daily. What changed:   when to take this  reasons to take this   oxyCODONE-acetaminophen 7.5-325 MG tablet Commonly known as: Percocet Take  1 tablet by mouth every 6 (six) hours as needed for severe pain.   SUMAtriptan 50 MG tablet Commonly known as: Imitrex Take 1 tablet (50 mg total) by mouth daily as needed for migraine. May repeat in 2 hours if headache persists or recurs.   triamcinolone 0.1 % Commonly known as: KENALOG Apply 1 application topically as needed.   valsartan 80 MG tablet Commonly known as: Diovan Take 1.5 tablets (120 mg total) by mouth daily. What changed: when to take this   Vitamin D (Ergocalciferol) 1.25 MG (50000 UNIT) Caps capsule Commonly known as: DRISDOL Take 50,000 Units by mouth once a week. sunday         Follow-up Information    Princess Bruins, MD Follow up in 2 week(s).   Specialty: Obstetrics and Gynecology Contact information: [address] [place] [address] [phone]                Signed: Princess Bruins 09/28/2020, 4:16 PM

## 2020-09-28 NOTE — Transfer of Care (Signed)
Immediate Anesthesia Transfer of Care Note  Patient: HAVANAH NELMS  Procedure(s) Performed: Procedure(s) (LRB): XI ROBOTIC ASSISTED TOTAL LAPAROSCOPIC HYSTERECTOMY WITH BILATERAL SALPINGO OOPHORECTOMY (Bilateral)  Patient Location: PACU  Anesthesia Type: General  Level of Consciousness: awake, oriented, sedated and patient cooperative  Airway & Oxygen Therapy: Patient Spontanous Breathing and Patient connected to face mask oxygen  Post-op Assessment: Report given to PACU RN and Post -op Vital signs reviewed and stable  Post vital signs: Reviewed and stable  Complications: No apparent anesthesia complications Last Vitals:  Vitals Value Taken Time  BP 125/64 09/28/20 1054  Temp    Pulse 88 09/28/20 1101  Resp 13 09/28/20 1101  SpO2 100 % 09/28/20 1101  Vitals shown include unvalidated device data.  Last Pain:  Vitals:   09/28/20 0647  TempSrc: Oral         Complications: No complications documented.

## 2020-09-28 NOTE — Op Note (Signed)
Operative Note  09/28/2020  10:51 AM  PATIENT:  Theresa Sawyer  40 y.o. female  PRE-OPERATIVE DIAGNOSIS:  persistant menorrhagia, pelvic pain and pressure, dysmenorrhea, dyspareunia post endometrial ablation, probable adenomyosis  POST-OPERATIVE DIAGNOSIS:  persistant menorrhagia, pelvic pain and pressure, dysmenorrhea, dyspareunia post endometrial ablation, probable adenomyosis  PROCEDURE:  Procedure(s): XI ROBOTIC ASSISTED TOTAL LAPAROSCOPIC HYSTERECTOMY WITH BILATERAL SALPINGO OOPHORECTOMY  SURGEON:  Surgeon(s): Princess Bruins, MD  Assistant:  Olivia Mackie RNFA  ANESTHESIA:   general  FINDINGS: Uterus normal volume and shape, tubes s/p bilateral tubal ligation, ovaries wnl with a functional cyst on the left.  DESCRIPTION OF OPERATION: Under general anesthesia with endotracheal intubation the patient is in lithotomy position.  She is prepped with DuraPrep on the abdomen and with Betadine on the suprapubic, vulvar and vaginal areas.  The patient is draped as usual.  Timeout is done.  The vaginal exam reveals an anteverted uterus, normal volume and mobile, no adnexal mass.  The Foley is put in place in the bladder.  The weighted speculum is inserted in the vagina.  The anterior lip of the cervix is grasped with a tenaculum.  The hysterometry is at 8 cm.  Dilation of the cervix with Pratt dilators up to #17.  The #8 Rumi with the medium Koh ring are put in place and the other instruments are removed.  We go to the abdomen.      The supraumbilical area is infiltrated with Marcaine one quarter plain.  We make a 1.5 cm incision at that level with the scalpel.  The aponeurosis is grasped with cokers and opened with Mayo scissors under direct vision.  The parietal peritoneum is open bluntly with the finger.  A pursestring stitch of Vicryl 0 is done on the aponeurosis.  The Hossein is inserted at that level under direct vision and up pneumoperitoneum is created with CO2.  The camera is inserted for  inspection of the abdomen and pelvis.  No pathology is seen in the abdomen except for a mild adhesion between the omentum and the anterior abdominal wall.  In the pelvis, we note a uterus of normal size and appearance, bilateral tubes status post tubal ligation, normal ovaries bilaterally with a small functional cyst on the left.  We marked the skin for the port sites which will be in line with the umbilicus.  Infiltration of Marcaine one quarter plain at each level and small incisions made with the scalpel.  We insert all ports under direct vision with 2 robotic ports on the right and 1 robotic ports on the lateral left and the 5 mm assistant ports on the medial left.  The patient is positioned in 30 degree Trendelenburg and the robot is docked.  Targeting is done.  The robotic instruments are inserted under direct vision with a bipolar in the first arm, the camera in the second arm, the EndoShears scissors in the third arm and the fenestrated prograsp in the fourth arm.  We go to the console.      Both ureters are seen with good peristalsis and normal anatomic position.  We start on the right side with cauterization and section of the right infundibulopelvic ligament.  We follow under the right ovary with cauterization and section.  We then cauterized and sectioned the right round ligament.  We opened the right broad ligament close to the uterus.  We opened the visceral peritoneum anteriorly and start lowering the bladder.  We proceeded exactly the same way on the left side  and further lower the bladder past the [facility] ring.  We then cauterize and section the right and left uterine arteries.  The vaginal occluder is inflated.  We opened the vaginal vault over the top of the Koh ring anteriorly with the point of the scissors we then open on each lateral side and at the back.  The uterus is completely detached with the cervix, both tubes and both ovaries.  It is easily passed vaginally and sent to pathology.  The  vaginal occluder is put back in place.  We switched instruments to the cutting needle driver in the third arm and the long tip in the first arm.  We came to the fenestrated prograsp in the fourth arm.  We used a V-Loc 0 at 9 inches to close the vaginal vault.  Hemostasis was verified and was adequate at all levels.  We started at the right vaginal angle and do a running suture all the way to the left vaginal angle and then back to the middle.  The needle is removed from the abdomen.  Irrigation and suction is done.  Hemostasis is adequate at all levels.  Good peristalsis is seen bilaterally at the ureters.  The urine is clear.  We remove all robotic instruments under direct vision.  The robot is undocked.  All ports are removed and the CO2 is evacuated.  We closed the pursestring stitch at the aponeurosis by attaching it.  We then closed all skin incisions with a subcuticular suture of Vicryl 4-0.  Dermabond is added on all incisions.  The occluder is removed from the vagina and hemostasis is adequate at that level as well.  The patient is brought to recovery room in good and stable status.   ESTIMATED BLOOD LOSS: 30 mL   Intake/Output Summary (Last 24 hours) at 09/28/2020 1051 Last data filed at 09/28/2020 1039 Gross per 24 hour  Intake 1100 ml  Output 230 ml  Net 870 ml     BLOOD ADMINISTERED:none   LOCAL MEDICATIONS USED:  MARCAINE     SPECIMEN:  Source of Specimen:  Uterus with cervix, bilateral tubes and ovaries  DISPOSITION OF SPECIMEN:  PATHOLOGY  COUNTS:  YES  PLAN OF CARE: Transfer to PACU  Marie-Lyne LavoieMD10:51 AM

## 2020-09-28 NOTE — Anesthesia Procedure Notes (Signed)
Procedure Name: Intubation Date/Time: 09/28/2020 8:32 AM Performed by: Suan Halter, CRNA Pre-anesthesia Checklist: Patient identified, Emergency Drugs available, Suction available and Patient being monitored Patient Re-evaluated:Patient Re-evaluated prior to induction Oxygen Delivery Method: Circle system utilized Preoxygenation: Pre-oxygenation with 100% oxygen Induction Type: IV induction Ventilation: Mask ventilation without difficulty Laryngoscope Size: Mac and 4 Grade View: Grade II Tube type: Oral Tube size: 7.0 mm Number of attempts: 1 Airway Equipment and Method: Stylet and Oral airway Placement Confirmation: ETT inserted through vocal cords under direct vision,  positive ETCO2 and breath sounds checked- equal and bilateral Secured at: 22 cm Tube secured with: Tape Dental Injury: Teeth and Oropharynx as per pre-operative assessment

## 2020-09-28 NOTE — H&P (Signed)
Theresa Sawyer is an 40 y.o. female [phone]   RP:XI Robotic TLH/BSO for Menometrorrhagia/Dysmenorrhea/Chronic Pelvic Pain  HPI:HSC Excision of Polyps/D+C Benign Patho 10/2019. Still experiencing heavy irregular menses with severe dysmenorrhea. Chronic pelvic pain. Desires a Hysterectomy.  Pertinent Gynecological History: Menses: Menometrorrhagia Contraception:  Blood transfusions: none Sexually transmitted diseases: no past history Previous GYN Procedures: [place] Excision of Polyps/D+C 10/2019 Last mammogram: normal  Last pap: normal     Menstrual History: Patient's last menstrual period was 09/01/2020.    Past Medical History:  Diagnosis Date  . Abnormal uterine bleeding (AUB)   . Anxiety   . Arthritis    oa  . Depression   . GERD (gastroesophageal reflux disease)   . History of chest pain (10-20-2019  pt denies any cardiac s&s)   mutliple ED visits, atypical chest pain, chest wall pain, muscularskeletal chest pain;  pt had cardiology evaulation dated 04-23-2018 note in epic , state atypical chest wall pain with negative d dimer and troponin's multiple times in epic, suggested CTA  (pt did not get done)  . Hyperlipidemia   . Hypertension   . IDA (iron deficiency anemia)    none recently  . Menorrhagia   . Migraine   . OSA on CPAP    followed by dr ather cpap set on 7 to 14  . Psoriasis   . Urinary frequency     Past Surgical History:  Procedure Laterality Date  . DILATATION & CURETTAGE/HYSTEROSCOPY WITH MYOSURE N/A 10/24/2019   Procedure: DILATATION & CURETTAGE/HYSTEROSCOPY WITH MYOSURE;  Surgeon: Joseph Pierini, MD;  Location: [facility];  Service: Gynecology;  Laterality: N/A;  . HYSTEROSCOPY WITH NOVASURE N/A 04/06/2020   Procedure: DILATION AND CURRETTAGE; NOVASURE ABLATION;  Surgeon: Joseph Pierini, MD;  Location: [facility];  Service: Gynecology;  Laterality: N/A;  . LAPAROSCOPY N/A 04/06/2020   Procedure: LAPAROSCOPY  OPERATIVE; LYSIS OF ADHESION;  Surgeon: Joseph Pierini, MD;  Location: [facility];  Service: Gynecology;  Laterality: N/A;  . TUBAL LIGATION Bilateral 02/05/2013   Procedure: POST PARTUM TUBAL LIGATION;  Surgeon: Woodroe Mode, MD;  Location: [facility];  Service: Gynecology;  Laterality: Bilateral;    Filshie clips    Family History  Problem Relation Age of Onset  . Diabetes Maternal Grandmother   . Diabetes Paternal Grandmother   . Anxiety disorder Mother   . Psoriasis Sister   . Heart attack Brother 34       deceased from this  . Anxiety disorder Brother   . Cirrhosis Maternal Grandfather        alcohol related  . Allergy (severe) Son   . Healthy Son   . Colon cancer Neg Hx   . Esophageal cancer Neg Hx   . Colon polyps Neg Hx   . Stomach cancer Neg Hx   . Pancreatic cancer Neg Hx     Social History:  reports that she has been smoking cigarettes. She has a 10.00 pack-year smoking history. She has never used smokeless tobacco. She reports that she does not drink alcohol and does not use drugs.  Allergies:  Allergies  Allergen Reactions  . Nsaids     Do not take per rheumatologist    Medications Prior to Admission  Medication Sig Dispense Refill Last Dose  . ALPRAZolam (XANAX) 0.5 MG tablet TAKE 1 TABLET BY MOUTH EVERY DAY AS NEEDED FOR ANXIETY 30 tablet 1   . cyclobenzaprine (FLEXERIL) 10 MG tablet Take 1 tablet (10 mg total)  by mouth 3 (three) times daily as needed for muscle spasms. 30 tablet 0   . diclofenac Sodium (VOLTAREN) 1 % GEL Apply 2 g topically 4 (four) times daily. (Patient taking differently: Apply 2 g topically 2 (two) times daily.) 50 g 2   . escitalopram (LEXAPRO) 20 MG tablet TAKE 1 TABLET(20 MG) BY MOUTH DAILY (Patient taking differently: at bedtime.) 90 tablet 3   . famotidine (PEPCID) 40 MG tablet TAKE 1 TABLET(40 MG) BY MOUTH TWICE DAILY 60 tablet 3   . hydrochlorothiazide (HYDRODIURIL) 25 MG tablet Take 1 tablet (25 mg total) by mouth  daily. 90 tablet 3   . meloxicam (MOBIC) 15 MG tablet Take 1 tablet (15 mg total) by mouth daily. (Patient taking differently: Take 15 mg by mouth 2 (two) times daily as needed.) 30 tablet 0   . SUMAtriptan (IMITREX) 50 MG tablet Take 1 tablet (50 mg total) by mouth daily as needed for migraine. May repeat in 2 hours if headache persists or recurs. 10 tablet 0   . triamcinolone cream (KENALOG) 0.1 % Apply 1 application topically as needed.      . valsartan (DIOVAN) 80 MG tablet Take 1.5 tablets (120 mg total) by mouth daily. (Patient taking differently: Take 120 mg by mouth at bedtime.) 90 tablet 3   . Vitamin D, Ergocalciferol, (DRISDOL) 1.25 MG (50000 UNIT) CAPS capsule Take 50,000 Units by mouth once a week. sunday       REVIEW OF SYSTEMS: A ROS was performed and pertinent positives and negatives are included in the history.  GENERAL: No fevers or chills. HEENT: No change in vision, no earache, sore throat or sinus congestion. NECK: No pain or stiffness. CARDIOVASCULAR: No chest pain or pressure. No palpitations. PULMONARY: No shortness of breath, cough or wheeze. GASTROINTESTINAL: No abdominal pain, nausea, vomiting or diarrhea, melena or bright red blood per rectum. GENITOURINARY: No urinary frequency, urgency, hesitancy or dysuria. MUSCULOSKELETAL: No joint or muscle pain, no back pain, no recent trauma. DERMATOLOGIC: No rash, no itching, no lesions. ENDOCRINE: No polyuria, polydipsia, no heat or cold intolerance. No recent change in weight. HEMATOLOGICAL: No anemia or easy bruising or bleeding. NEUROLOGIC: No headache, seizures, numbness, tingling or weakness. PSYCHIATRIC: No depression, no loss of interest in normal activity or change in sleep pattern.     Blood pressure 135/80, pulse 90, temperature 98.1 F (36.7 C), temperature source Oral, resp. rate 18, height 5' 5"  (1.651 m), weight 115.5 kg, last menstrual period 09/01/2020, SpO2 96 %.  Physical Exam:  See office notes   Results  for orders placed or performed during the hospital encounter of 09/28/20 (from the past 24 hour(s))  Pregnancy, urine POC     Status: None   Collection Time: 09/28/20  7:05 AM  Result Value Ref Range   Preg Test, Ur NEGATIVE NEGATIVE   Covid Neg CMP normal CBC Hb 15.  WBC 13.8  Pelvic US 08/2019:Uterus 8.6x6.0 x 5.1 cm, anteverted normal No myometrial masses Endometrium significantly thick at 21 mm  3D imaging indicates several endometrial polyps likely present Bilateral ovaries normal, normal follicle patter No adnexal masses No free fluid.   Assessment/Plan:39 [id number]   1. Pelvic pain in female Chronic pelvic pain with severe dysmenorrhea with menometrorrhagia. HSC/Excision of Polyp/D+C 10/2019, benign pathology.Decision to proceed with XI Robotic TLH/BSO. Risks/Benefits of Bilateral Oophorectomy thoroughly reviewed.  Patient understands that this will put her in surgical menopause.  2. Severe dysmenorrhea Severe persistent dysmenorrhea.  3. DUB (dysfunctional uterine bleeding) H/O  Endometrial Polyps. Menometrorrhagia persisting post excision of Polyps.                        Patient was counseled as to the risk of surgery to include the following:  1. Infection (prohylactic antibiotics will be administered)  2. DVT/Pulmonary Embolism (prophylactic pneumo compression stockings will be used)  3.Trauma to internal organs requiring additional surgical procedure to repair any injury to internal organs requiring perhaps additional hospitalization days.  4.Hemmorhage requiring transfusion and blood products which carry risks such as anaphylactic reaction, hepatitis and AIDS  Patient had received literature information on the procedure scheduled and all her questions were answered and fully accepts all risk.  Princess Bruins MD 09/28/2020 at 7:16

## 2020-09-28 NOTE — Anesthesia Postprocedure Evaluation (Signed)
Anesthesia Post Note  Patient: Theresa Sawyer  Procedure(s) Performed: XI ROBOTIC ASSISTED TOTAL LAPAROSCOPIC HYSTERECTOMY WITH BILATERAL SALPINGO OOPHORECTOMY (Bilateral )     Patient location during evaluation: PACU Anesthesia Type: General Level of consciousness: sedated Pain management: pain level controlled Vital Signs Assessment: post-procedure vital signs reviewed and stable Respiratory status: spontaneous breathing Cardiovascular status: stable Postop Assessment: no apparent nausea or vomiting Anesthetic complications: no   No complications documented.  Last Vitals:  Vitals:   09/28/20 1100 09/28/20 1115  BP: (!) 101/47 110/68  Pulse: 84 89  Resp: 18 17  Temp: (!) 36.3 C   SpO2: 100% 99%    Last Pain:  Vitals:   09/28/20 0647  TempSrc: Oral                 Huston Foley

## 2020-09-29 ENCOUNTER — Encounter (HOSPITAL_BASED_OUTPATIENT_CLINIC_OR_DEPARTMENT_OTHER): Payer: Self-pay | Admitting: Obstetrics & Gynecology

## 2020-09-29 LAB — SURGICAL PATHOLOGY

## 2020-09-30 ENCOUNTER — Telehealth: Payer: Self-pay | Admitting: *Deleted

## 2020-09-30 NOTE — Telephone Encounter (Signed)
I went to discuss with Dr.Lavoie the below she verbally told me patient can use the Percocet 2 tablets every 4 hours ( no more than 2 tablet) along with 800 mg tablet of ibuprofen every 8 hours on the dot.  I informed patient with this information and asked patient to repeat what I told her back to me. Patient did this and verbalized she understood.

## 2020-09-30 NOTE — Telephone Encounter (Signed)
Patient post XI ROBOTIC ASSISTED TOTAL LAPAROSCOPIC HYSTERECTOMY WITH BILATERAL SALPINGO OOPHORECTOMY on 09/28/20. Called today stating she is having sugery pain, she is taking Percocet every 6 hours( 1 tablet in am and 1 tablet in pm)  and was told to take ibuprofen in between the Percocet. Patient said the Percocet only works for 30 minutes for the pain, and the ibuprofen in between is not touching the pain at all. Patient asked what else can she do to help management pain?

## 2020-10-01 ENCOUNTER — Encounter (HOSPITAL_BASED_OUTPATIENT_CLINIC_OR_DEPARTMENT_OTHER): Payer: Self-pay | Admitting: Obstetrics & Gynecology

## 2020-10-03 ENCOUNTER — Encounter: Payer: Self-pay | Admitting: Obstetrics & Gynecology

## 2020-10-05 ENCOUNTER — Telehealth: Payer: Self-pay

## 2020-10-05 ENCOUNTER — Other Ambulatory Visit: Payer: Self-pay | Admitting: Podiatry

## 2020-10-05 ENCOUNTER — Other Ambulatory Visit: Payer: Self-pay | Admitting: Family Medicine

## 2020-10-05 DIAGNOSIS — M545 Low back pain, unspecified: Secondary | ICD-10-CM

## 2020-10-05 DIAGNOSIS — G43009 Migraine without aura, not intractable, without status migrainosus: Secondary | ICD-10-CM

## 2020-10-05 DIAGNOSIS — G8929 Other chronic pain: Secondary | ICD-10-CM

## 2020-10-05 MED ORDER — TRAMADOL HCL 50 MG PO TABS: 50.0000 mg | ORAL_TABLET | Freq: Four times a day (QID) | ORAL | 0 refills | Status: DC | PRN

## 2020-10-05 NOTE — Telephone Encounter (Signed)
Dr. Dellis Filbert replied in writing on print out of telephone encounter I placed on her desk. She wrote "No need to advance post op if better on Tramadol. Agree with Tramadol 50 mg #20."  I spoke with patient and let her know I could send in Tramadol for pain. She opts to cancel the appointment for tomorrow and try the Tramadol. She said she re-inspected her incisions after I asked her about them and they all look fine. The only thing she sees is some bruising.  Rx sent and appt cancelled.

## 2020-10-05 NOTE — Telephone Encounter (Signed)
Patient called. Had Robotic [facility] 09/28/20.  C/O pain at umbilical incisions.  She said incisions look fine. No draining or redness. Just some bruising in that area.  The incisions burn some too.  She said the pain is a 6 on pain scale. She said it is not all the time but a lot of the time that she hurts like that. Really hurts with BM.  No fever.  Patient said she is up and moving around. I stressed that is important that she move. She said she may have moved a littlle more yesterday than she should have.   She said Rheumatologist warned her about taking too much Ibuprofen as if affected something in her blood levels so the amount she has been taking not really helping with pain.  She asked if perhaps she can get Rx maybe not as strong as Percocet.  Theresa Sawyer has her tentatively scheduled for appointment tomorrow pending me checking with you about this.

## 2020-10-06 ENCOUNTER — Ambulatory Visit: Payer: 59 | Admitting: Obstetrics & Gynecology

## 2020-10-11 ENCOUNTER — Other Ambulatory Visit: Payer: Self-pay

## 2020-10-11 ENCOUNTER — Ambulatory Visit
Admission: RE | Admit: 2020-10-11 | Discharge: 2020-10-11 | Disposition: A | Discharge status: Home / Self Care | Payer: 59 | Source: Ambulatory Visit | Attending: Podiatry | Admitting: Podiatry

## 2020-10-11 DIAGNOSIS — S96912D Strain of unspecified muscle and tendon at ankle and foot level, left foot, subsequent encounter: Secondary | ICD-10-CM

## 2020-10-11 DIAGNOSIS — S93692A Other sprain of left foot, initial encounter: Secondary | ICD-10-CM

## 2020-10-12 ENCOUNTER — Telehealth: Payer: Self-pay

## 2020-10-12 ENCOUNTER — Ambulatory Visit (INDEPENDENT_AMBULATORY_CARE_PROVIDER_SITE_OTHER): Payer: 59 | Admitting: Obstetrics & Gynecology

## 2020-10-12 ENCOUNTER — Encounter: Payer: Self-pay | Admitting: Obstetrics & Gynecology

## 2020-10-12 VITALS — BP 136/84 | Temp 98.7°F

## 2020-10-12 DIAGNOSIS — R109 Unspecified abdominal pain: Secondary | ICD-10-CM

## 2020-10-12 LAB — CBC WITH DIFFERENTIAL/PLATELET
Absolute Monocytes: 1047 cells/uL — ABNORMAL HIGH (ref 200–950)
Basophils Absolute: 143 cells/uL (ref 0–200)
Basophils Relative: 1.2 %
Eosinophils Absolute: 417 cells/uL (ref 15–500)
Eosinophils Relative: 3.5 %
HCT: 39.4 % (ref 35.0–45.0)
Hemoglobin: 13.6 g/dL (ref 11.7–15.5)
Lymphs Abs: 3891 cells/uL (ref 850–3900)
MCH: 30.3 pg (ref 27.0–33.0)
MCHC: 34.5 g/dL (ref 32.0–36.0)
MCV: 87.8 fL (ref 80.0–100.0)
MPV: 9.7 fL (ref 7.5–12.5)
Monocytes Relative: 8.8 %
Neutro Abs: 6402 cells/uL (ref 1500–7800)
Neutrophils Relative %: 53.8 %
Platelets: 337 10*3/uL (ref 140–400)
RBC: 4.49 10*6/uL (ref 3.80–5.10)
RDW: 14.7 % (ref 11.0–15.0)
Total Lymphocyte: 32.7 %
WBC: 11.9 10*3/uL — ABNORMAL HIGH (ref 3.8–10.8)

## 2020-10-12 NOTE — Progress Notes (Signed)
    Theresa Sawyer 09-13-1980 [id number]        40 y.o.  [id number]   RP: Postop intermittent abdominal pains  HPI: Postop intermittent abdominal pains worse when laying down and trying to sit up.  Incisions intact.  No problem eating, passing gas and having BMs.  No UTI Sx.  No vaginal bleeding or d/c.  No fever.   OB History  Gravida Para Term Preterm AB Living  3 1 1  0 2 1  SAB IAB Ectopic Multiple Live Births  1 1 0 0 1    # Outcome Date GA Lbr Len/2nd Weight Sex Delivery Anes PTL Lv  3 Term 02/04/13 [id number]:35 / 03:55 7 lb 10.8 oz (3.481 kg) M Vag-Spont EPI  LIV     Birth Comments: WNL  2 SAB         DEC  1 IAB         DEC    Past medical history,surgical history, problem list, medications, allergies, family history and social history were all reviewed and documented in the EPIC chart.   Directed ROS with pertinent positives and negatives documented in the history of present illness/assessment and plan.  Exam:  There were no vitals filed for this visit. General appearance:  Normal  Abdomen: Incisions intact, closed, no erythema, no induration, no d/c.  Soft, not distended.  BS present.  Gynecologic exam: Vulva normal.  Speculum:  Vaginal vault closed.  No active bleeding, no d/c.    U/A Few bacteria, otherwise neg. Pending urine culture.   Assessment/Plan:  40y.o. G3P1021   1. Intermittent abdominal pain Intermittent abdominal pains worse when laying down and trying to sit up.  No evidence of Postop Cx with good progression and normal exam.  Will verify a CBC with differential today.  Pending urine culture.  Precautions reviewed with patient.  F/U Postop visit. - CBC w/Diff - Urinalysis,Complete w/RFL Culture  MPrincess BruinsMD, 4:28 PM 10/12/2020

## 2020-10-12 NOTE — Telephone Encounter (Signed)
Robotic Assisted TLH,BSO 09/28/20  Patient called. C/o excruciating cramp. Was starting to feel better through the weekend and did not take Tramadol. She said she was more active over the weekend because she gelt better but no lifting. Then she was uncomfortable yesterday but today Started with a dull pain  and worsened as today has gone on.  Went to grocery store today and came home doubled over. Back hurting a little bit with it. No UTI sx.   I asked about bowel movements. She said since surgery they are regular but she does have some pain right before BM and it goes away after BM.  While we were speaking she rates her pain a 7.  No fever. Feels fine except for the pain.  She states it  "feels like my insides could fall out".  Took a Tramadol 40 minutes ago but no relief at this time.

## 2020-10-12 NOTE — Telephone Encounter (Signed)
I spoke with Dr. Dellis Filbert about this and she said tell patient to come to the office to be seen. I called patient and relayed. She has to pick her son up from school and will be here by 3:40pm she said.

## 2020-10-14 ENCOUNTER — Other Ambulatory Visit: Payer: Self-pay | Admitting: Gastroenterology

## 2020-10-14 LAB — URINALYSIS, COMPLETE W/RFL CULTURE
Bilirubin Urine: NEGATIVE
Glucose, UA: NEGATIVE
Hyaline Cast: NONE SEEN /LPF
Ketones, ur: NEGATIVE
Leukocyte Esterase: NEGATIVE
Nitrites, Initial: NEGATIVE
Protein, ur: NEGATIVE
Specific Gravity, Urine: 1.025 (ref 1.001–1.03)
pH: 5.5 (ref 5.0–8.0)

## 2020-10-14 LAB — URINE CULTURE
MICRO NUMBER:: 11732872
SPECIMEN QUALITY:: ADEQUATE

## 2020-10-14 LAB — CULTURE INDICATED

## 2020-10-15 ENCOUNTER — Other Ambulatory Visit: Payer: Self-pay | Admitting: Obstetrics & Gynecology

## 2020-10-15 ENCOUNTER — Ambulatory Visit (INDEPENDENT_AMBULATORY_CARE_PROVIDER_SITE_OTHER): Payer: 59 | Admitting: Obstetrics & Gynecology

## 2020-10-15 ENCOUNTER — Other Ambulatory Visit: Payer: Self-pay

## 2020-10-15 ENCOUNTER — Encounter: Payer: Self-pay | Admitting: Obstetrics & Gynecology

## 2020-10-15 VITALS — BP 122/78

## 2020-10-15 DIAGNOSIS — R109 Unspecified abdominal pain: Secondary | ICD-10-CM | POA: Diagnosis not present

## 2020-10-15 DIAGNOSIS — Z09 Encounter for follow-up examination after completed treatment for conditions other than malignant neoplasm: Secondary | ICD-10-CM | POA: Diagnosis not present

## 2020-10-15 NOTE — Progress Notes (Signed)
    Theresa Sawyer 03-28-1981 [id number]        40 y.o.  [id number]   RP: Postop XI Robotic TLH/BSO on 09/28/20  HPI: Slowly improving, but still experiencing intermittent abdominal pain, probably associated with bowel gas.  Mild vaginal spotting after last visit with speculum exam 3 days ago.  No UTI Sx.  BMs normal.  No fever.   OB History  Gravida Para Term Preterm AB Living  3 1 1  0 2 1  SAB IAB Ectopic Multiple Live Births  1 1 0 0 1    # Outcome Date GA Lbr Len/2nd Weight Sex Delivery Anes PTL Lv  3 Term 02/04/13 [id number]:35 / 03:55 7 lb 10.8 oz (3.481 kg) M Vag-Spont EPI  LIV     Birth Comments: WNL  2 SAB         DEC  1 IAB         DEC    Past medical history,surgical history, problem list, medications, allergies, family history and social history were all reviewed and documented in the EPIC chart.   Directed ROS with pertinent positives and negatives documented in the history of present illness/assessment and plan.  Exam:  Vitals:   10/15/20 0935  BP: 122/78   General appearance:  Normal  Abdomen: Soft, BS present.  Incisions healing well, no erythema or discharge.  Gynecologic exam: Vulva normal.  Speculum:  Vaginal vault closed.  Minimal dark blood in vagina.  No discharge.    CBC 10/12/2020 Hb 13.6, WBC 11.9   Assessment/Plan:  40y.o. G3P1021   1. Status post gynecological surgery, follow-up exam Good postop evolution with no sign of complication.  No sign of infection.  F/U in 4-5 weeks to assess the vaginal vault before sexual activity.  2. Intermittent abdominal pain Probably of intestinal origin.  Counseling done on nutrition and the importance to move around, but no excess physical activity.  Precautions reviewed.  MPrincess BruinsMD, 9:46 AM 10/15/2020

## 2020-10-17 ENCOUNTER — Encounter: Payer: Self-pay | Admitting: Obstetrics & Gynecology

## 2020-10-18 ENCOUNTER — Telehealth: Payer: Self-pay | Admitting: *Deleted

## 2020-10-18 ENCOUNTER — Telehealth: Payer: Self-pay

## 2020-10-18 NOTE — Telephone Encounter (Signed)
-----   Message from Trula Slade, DPM sent at 10/18/2020  2:21 AM EDT ----- Lattie Haw- please have her remain in the CAM boot. The MRI shows significant plantar fasciitis. There is some swelling in the bone as well. I would recommend staying in the CAM boot and have her follow up with me when I get back to discuss next steps. Thanks!

## 2020-10-18 NOTE — Telephone Encounter (Signed)
I called and left a message for the patient and relayed the message per Dr Jacqualyn Posey. Lattie Haw

## 2020-10-18 NOTE — Telephone Encounter (Signed)
Patient called complaining of hotflashes and mood swings. She said she was here for post op visit on Friday but the conversation was busy and she forgot to get you to address this issue. Does she need another visit to discuss?

## 2020-10-19 ENCOUNTER — Encounter: Payer: Self-pay | Admitting: Obstetrics & Gynecology

## 2020-10-26 ENCOUNTER — Other Ambulatory Visit: Payer: Self-pay

## 2020-10-26 ENCOUNTER — Encounter: Payer: Self-pay | Admitting: Dietician

## 2020-10-26 ENCOUNTER — Encounter: Payer: 59 | Attending: Family Medicine | Admitting: Dietician

## 2020-10-26 DIAGNOSIS — I10 Essential (primary) hypertension: Secondary | ICD-10-CM | POA: Insufficient documentation

## 2020-10-26 NOTE — Patient Instructions (Addendum)
Start taking a daily pre-natal multivitamin.  Keep an eye on your sodium intake. Aim to eat less than 2,000 mg per day.  Use a combination of these three strategies to effectively lower your consumption of sodium 1) Consume a smaller portion when having pickles 2) Consume pickles less frequently 3) Find a reduced or low sodium version  Work on eating lunch more consistently. When you go back to work, make sure you are a having a balanced lunch as often as possible. Bring in leftovers from dinner for lunch!  Eat three meals a day, about 5-6 hours apart!  Begin to build your meals using the proportions of the Balanced Plate. . First, select your carb source for the meal. . Next, select your source of protein to pair with your carb. . Finally, complete the remaining half of your meal with a variety of non-starchy vegetables.

## 2020-10-26 NOTE — Progress Notes (Signed)
Medical Nutrition Therapy  Appointment Start time:  50  Appointment End time:  1120  Primary concerns today: Weight Loss  Referral diagnosis: I10 Hypertension Preferred learning style: No preference indicated) Learning readiness: Contemplating   NUTRITION ASSESSMENT   Anthropometrics  Ht: 5'5" Wt: 259.6 lbs Body mass index is 43.2 kg/m.   Clinical Medical Hx: HTN, OSA, GERD, HLD, Osteoarthrits Medications: Hydrochlorthiazide, Famotidine Labs: BP-122/78 (10/15/2020) Notable Signs/Symptoms: Fatigue  Lifestyle & Dietary Hx Pt reports having major reflux, needs to take famotidine. When not taking famotidine, symptoms are bad. Pt had a full hysterectomy on March 22, and has been at home recovering. Pt states they feel their depression has been a little worse during this time. Pt will return to work in ~2 weeks.  Pt works in [employer], on their feet and very active while working. Pt reports history of anemia from previous health concerns that required hysterectomy. Pt was taking an OTC iron supplement, B12, and MV but has stopped. Pt has history of elevated liver enzymes, pt said it was attributed to taking a lot of ibuprofen for arthritis pain at the time. ALT is still elevated, but coming down Pt states they have no energy, tired all the time for about the past year.  Pt has been eating less during this period as well. Pt usually misses lunch, eats breakfast around 9:30 and dinner around 6:30. Pt states they would usually miss lunch due to being too busy at work. Might only eat a very small lunch on the weekends.   Estimated daily fluid intake: ~100 oz Supplements: Vitamin D Sleep: Sometimes ok, OSA and uses CPAP. Needs to see neurologist to check if the CPAP needs to be adjusted. Stress / self-care: Currently 6/10, was higher stress until taking Lexapro Current average weekly physical activity: ADLs, very active at work, plays with 50 year old kid   24-Hr Dietary Recall First  Meal: [employer] biscuit, water Snack: none Second Meal: none Snack: water Third Meal: 1 piece of [employer] pizza, coke Snack: Dark chocolate PB KIND protein bar Beverages: water, coke   NUTRITION DIAGNOSIS  NB-1.1 Food and nutrition-related knowledge deficit As related to obesity.  As evidenced by BMI of 43.20 kg/m2, skipping lunch, and consumption of energy dense foods.   NUTRITION INTERVENTION  Nutrition education (E-1) on the following topics:  Educated patient on the balanced plate eating model. Recommended lunch and dinner be 1/2 non-starchy vegetables, 1/4 starches, and 1/4 protein.  Discussed with patient the importance of working towards hitting the proportions of the balanced plate consistently. Counseled patient on ways to begin recognizing each of the food groups from the balanced plate in their own meals, and how close they are to fitting the recommended proportions of the balanced plate. Educated patient on the nutritional value of each food group on the balanced plate model.   Recommended between 1,500 and 2,000 mg of sodium per day. Educated patient on common sources of high sodium foods including packaged/processed foods, deli meats, fast foods, pickled food, sports drinks, and canned foods. Educated patient on the positive impact of physical activity in lowering blood pressure and improving cardiovascular health.    Handouts Provided Include   Balanced Plate  Balanced Plate Food List  Learning Style & Readiness for Change Teaching method utilized: Visual & Auditory  Demonstrated degree of understanding via: Teach Back  Barriers to learning/adherence to lifestyle change: none  Goals Established by Pt  Start taking a daily pre-natal multivitamin.  Keep an eye  on your sodium intake. Aim to eat less than 2,000 mg per day.  Use a combination of these three strategies to effectively lower your consumption of sodium  1) Consume a smaller portion when having  pickles  2) Consume pickles less frequently  3) Find a reduced or low sodium version  Work on eating lunch more consistently. When you go back to work, make sure you are a having a balanced lunch as often as possible. Bring in leftovers from dinner for lunch!  Eat three meals a day, about 5-6 hours apart!  Begin to build your meals using the proportions of the Balanced Plate.  First, select your carb source for the meal.  Next, select your source of protein to pair with your carb.  Finally, complete the remaining half of your meal with a variety of non-starchy vegetables.    MONITORING & EVALUATION Dietary intake, weekly physical activity, and meals per day in 2 month.  Next Steps  Patient is to follow up with RD.

## 2020-10-27 ENCOUNTER — Other Ambulatory Visit: Payer: Self-pay

## 2020-10-28 ENCOUNTER — Encounter: Payer: Self-pay | Admitting: Emergency Medicine

## 2020-10-28 ENCOUNTER — Ambulatory Visit (INDEPENDENT_AMBULATORY_CARE_PROVIDER_SITE_OTHER): Payer: 59 | Admitting: Emergency Medicine

## 2020-10-28 VITALS — BP 118/76 | HR 104 | Temp 98.8°F | Ht 65.0 in | Wt 256.8 lb

## 2020-10-28 DIAGNOSIS — M8949 Other hypertrophic osteoarthropathy, multiple sites: Secondary | ICD-10-CM | POA: Diagnosis not present

## 2020-10-28 DIAGNOSIS — G894 Chronic pain syndrome: Secondary | ICD-10-CM

## 2020-10-28 DIAGNOSIS — I10 Essential (primary) hypertension: Secondary | ICD-10-CM

## 2020-10-28 DIAGNOSIS — F411 Generalized anxiety disorder: Secondary | ICD-10-CM

## 2020-10-28 DIAGNOSIS — R5381 Other malaise: Secondary | ICD-10-CM | POA: Diagnosis not present

## 2020-10-28 DIAGNOSIS — L4050 Arthropathic psoriasis, unspecified: Secondary | ICD-10-CM

## 2020-10-28 DIAGNOSIS — Z9989 Dependence on other enabling machines and devices: Secondary | ICD-10-CM

## 2020-10-28 DIAGNOSIS — R5382 Chronic fatigue, unspecified: Secondary | ICD-10-CM

## 2020-10-28 DIAGNOSIS — M159 Polyosteoarthritis, unspecified: Secondary | ICD-10-CM

## 2020-10-28 DIAGNOSIS — G4733 Obstructive sleep apnea (adult) (pediatric): Secondary | ICD-10-CM

## 2020-10-28 LAB — LIPID PANEL
Cholesterol: 176 mg/dL (ref 0–200)
HDL: 31.8 mg/dL — ABNORMAL LOW (ref >39.00)
Total CHOL/HDL Ratio: 6
Triglycerides: 738 mg/dL — ABNORMAL HIGH (ref 0.0–149.0)

## 2020-10-28 LAB — CBC WITH DIFFERENTIAL/PLATELET
Basophils Absolute: 0.1 10*3/uL (ref 0.0–0.1)
Basophils Relative: 1 % (ref 0.0–3.0)
Eosinophils Absolute: 0.4 10*3/uL (ref 0.0–0.7)
Eosinophils Relative: 3.7 % (ref 0.0–5.0)
HCT: 44.8 % (ref 36.0–46.0)
Hemoglobin: 15.2 g/dL — ABNORMAL HIGH (ref 12.0–15.0)
Lymphocytes Relative: 31.7 % (ref 12.0–46.0)
Lymphs Abs: 3.8 10*3/uL (ref 0.7–4.0)
MCHC: 33.8 g/dL (ref 30.0–36.0)
MCV: 89.8 fl (ref 78.0–100.0)
Monocytes Absolute: 0.9 10*3/uL (ref 0.1–1.0)
Monocytes Relative: 7.4 % (ref 3.0–12.0)
Neutro Abs: 6.7 10*3/uL (ref 1.4–7.7)
Neutrophils Relative %: 56.2 % (ref 43.0–77.0)
Platelets: 369 10*3/uL (ref 150.0–400.0)
RBC: 4.99 Mil/uL (ref 3.87–5.11)
RDW: 16 % — ABNORMAL HIGH (ref 11.5–15.5)
WBC: 12 10*3/uL — ABNORMAL HIGH (ref 4.0–10.5)

## 2020-10-28 LAB — COMPREHENSIVE METABOLIC PANEL
ALT: 70 U/L — ABNORMAL HIGH (ref 0–35)
AST: 40 U/L — ABNORMAL HIGH (ref 0–37)
Albumin: 4.2 g/dL (ref 3.5–5.2)
Alkaline Phosphatase: 100 U/L (ref 39–117)
BUN: 19 mg/dL (ref 6–23)
CO2: 22 mEq/L (ref 19–32)
Calcium: 9.5 mg/dL (ref 8.4–10.5)
Chloride: 99 mEq/L (ref 96–112)
Creatinine, Ser: 0.8 mg/dL (ref 0.40–1.20)
GFR: 91.55 mL/min (ref >60.00)
Glucose, Bld: 191 mg/dL — ABNORMAL HIGH (ref 70–99)
Potassium: 3.9 mEq/L (ref 3.5–5.1)
Sodium: 131 mEq/L — ABNORMAL LOW (ref 135–145)
Total Bilirubin: 0.5 mg/dL (ref 0.2–1.2)
Total Protein: 7.5 g/dL (ref 6.0–8.3)

## 2020-10-28 LAB — HEMOGLOBIN A1C: Hgb A1c MFr Bld: 5.9 % (ref 4.6–6.5)

## 2020-10-28 LAB — LDL CHOLESTEROL, DIRECT: Direct LDL: 88 mg/dL

## 2020-10-28 LAB — TSH: TSH: 1.48 u[IU]/mL (ref 0.35–4.50)

## 2020-10-28 NOTE — Progress Notes (Signed)
Theresa Sawyer 40 y.o.   Chief Complaint  Patient presents with  . Follow-up    3 months  . Back Pain    Per patient on and off for 6 months the lower area  . Fatigue    For 6 months    HISTORY OF PRESENT ILLNESS: This is a 40 y.o. female complaining of chronic fatigue and malaise for several months. Also has history of osteoarthritis and psoriasis with chronic pain to multiple joints and back. Chronic pain interfering with her quality of life. Overweight.  Recently saw a nutritionist. Has history of psoriasis.  Sees dermatologist on a regular basis. Has seen rheumatologist in the past but not lately. Sees gynecologist on a regular basis.  Status post hysterectomy last fall.  Doing well. History of hypertension on valsartan and hydrochlorothiazide.  Doing well. History of vitamin D deficiency on high-dose weekly vitamin D supplementation. No other complaints or medical concerns today.  HPI   Prior to Admission medications   Medication Sig Start Date End Date Taking? Authorizing Provider  ALPRAZolam Duanne Moron) 0.5 MG tablet TAKE 1 TABLET BY MOUTH EVERY DAY AS NEEDED FOR ANXIETY 08/27/20  Yes Kaelynne Christley, Ines Bloomer, MD  diclofenac Sodium (VOLTAREN) 1 % GEL Apply 2 g topically 4 (four) times daily. Patient taking differently: Apply 2 g topically 2 (two) times daily. 09/09/20  Yes Just, Laurita Quint, FNP  escitalopram (LEXAPRO) 20 MG tablet TAKE 1 TABLET(20 MG) BY MOUTH DAILY Patient taking differently: at bedtime. 02/16/20  Yes Sheilia Reznick, Ines Bloomer, MD  famotidine (PEPCID) 40 MG tablet TAKE 1 TABLET(40 MG) BY MOUTH TWICE DAILY 05/18/20  Yes Thornton Park, MD  hydrochlorothiazide (HYDRODIURIL) 25 MG tablet Take 1 tablet (25 mg total) by mouth daily. 07/20/20  Yes Just, Laurita Quint, FNP  triamcinolone cream (KENALOG) 0.1 % Apply 1 application topically as needed.    Yes [provider]  valsartan (DIOVAN) 80 MG tablet Take 1.5 tablets (120 mg total) by mouth daily. Patient taking  differently: Take 120 mg by mouth at bedtime. 07/28/20  Yes Just, Laurita Quint, FNP  Vitamin D, Ergocalciferol, (DRISDOL) 1.25 MG (50000 UNIT) CAPS capsule Take 50,000 Units by mouth once a week. sunday 08/27/20  Yes [provider]  cyclobenzaprine (FLEXERIL) 10 MG tablet Take 1 tablet (10 mg total) by mouth 3 (three) times daily as needed for muscle spasms. Patient not taking: Reported on 10/28/2020 09/09/20   Just, Laurita Quint, FNP  meloxicam (MOBIC) 15 MG tablet Take 1 tablet (15 mg total) by mouth daily. Patient not taking: Reported on 10/28/2020 09/09/20 09/09/21  Just, Laurita Quint, FNP  oxyCODONE-acetaminophen (PERCOCET) 7.5-325 MG tablet Take 1 tablet by mouth every 6 (six) hours as needed for severe pain. 09/28/20   Princess Bruins, MD  SUMAtriptan (IMITREX) 50 MG tablet Take 1 tablet (50 mg total) by mouth daily as needed for migraine. May repeat in 2 hours if headache persists or recurs. Patient not taking: Reported on 10/28/2020 07/28/20   Just, Laurita Quint, FNP  traMADol (ULTRAM) 50 MG tablet TAKE 1 TABLET BY MOUTH EVERY 6 HOURS AS NEEDED FOR PAIN Patient not taking: Reported on 10/28/2020 10/20/20   Princess Bruins, MD    Allergies  Allergen Reactions  . Nsaids     Do not take per rheumatologist    Patient Active Problem List   Diagnosis Date Noted  . Postoperative state 09/28/2020  . Essential hypertension 08/04/2020  . Vitamin D deficiency 08/04/2020  . Gastroesophageal reflux disease without esophagitis 08/04/2020  .  Migraine without aura and without status migrainosus, not intractable 08/04/2020  . Primary osteoarthritis involving multiple joints 03/18/2020  . Dysfunctional uterine bleeding 03/18/2020  . Generalized anxiety disorder 03/18/2020  . Psoriatic arthritis ([place]) 01/24/2020  . Primary osteoarthritis of both knees 01/24/2020  . History of hyperlipidemia 01/24/2020  . History of iron deficiency anemia 01/24/2020  . BMI 40.0-44.9, adult ([place]) 01/24/2020  . OSA on CPAP  04/24/2019  . Psoriasis 12/05/2018  . Situational anxiety 12/05/2018  . Intermittent hypertension 12/05/2018    Past Medical History:  Diagnosis Date  . Abnormal uterine bleeding (AUB)   . Anxiety   . Arthritis    oa  . Depression   . GERD (gastroesophageal reflux disease)   . History of chest pain (10-20-2019  pt denies any cardiac s&s)   mutliple ED visits, atypical chest pain, chest wall pain, muscularskeletal chest pain;  pt had cardiology evaulation dated 04-23-2018 note in epic , state atypical chest wall pain with negative d dimer and troponin's multiple times in epic, suggested CTA  (pt did not get done)  . Hyperlipidemia   . Hypertension   . IDA (iron deficiency anemia)    none recently  . Menorrhagia   . Migraine   . OSA on CPAP    followed by dr ather cpap set on 7 to 14  . Psoriasis   . Urinary frequency     Past Surgical History:  Procedure Laterality Date  . DILATATION & CURETTAGE/HYSTEROSCOPY WITH MYOSURE N/A 10/24/2019   Procedure: DILATATION & CURETTAGE/HYSTEROSCOPY WITH MYOSURE;  Surgeon: Joseph Pierini, MD;  Location: [place];  Service: Gynecology;  Laterality: N/A;  . HYSTEROSCOPY WITH NOVASURE N/A 04/06/2020   Procedure: DILATION AND CURRETTAGE; NOVASURE ABLATION;  Surgeon: Joseph Pierini, MD;  Location: [facility];  Service: Gynecology;  Laterality: N/A;  . LAPAROSCOPY N/A 04/06/2020   Procedure: LAPAROSCOPY OPERATIVE; LYSIS OF ADHESION;  Surgeon: Joseph Pierini, MD;  Location: [facility];  Service: Gynecology;  Laterality: N/A;  . ROBOTIC ASSISTED TOTAL HYSTERECTOMY WITH BILATERAL SALPINGO OOPHERECTOMY Bilateral 09/28/2020   Procedure: XI ROBOTIC ASSISTED TOTAL LAPAROSCOPIC HYSTERECTOMY WITH BILATERAL SALPINGO OOPHORECTOMY;  Surgeon: Princess Bruins, MD;  Location: [facility];  Service: Gynecology;  Laterality: Bilateral;  request 8:30am OR start time in [place] held time for Dr.  Dellis Filbert requests 2 hours [place] 2/10 W/NICK FOR  TRACY (RNFA) TO ASSIST  . TUBAL LIGATION Bilateral 02/05/2013   Procedure: POST PARTUM TUBAL LIGATION;  Surgeon: Woodroe Mode, MD;  Location: [facility];  Service: Gynecology;  Laterality: Bilateral;    Filshie clips    Social History   Socioeconomic History  . Marital status: Legally Separated    Spouse name: Not on file  . Number of children: Not on file  . Years of education: Not on file  . Highest education level: Not on file  Occupational History  . Not on file  Tobacco Use  . Smoking status: Current Every Day Smoker    Packs/day: 0.50    Years: 20.00    Pack years: 10.00    Types: Cigarettes  . Smokeless tobacco: Never Used  Vaping Use  . Vaping Use: Never used  Substance and Sexual Activity  . Alcohol use: No  . Drug use: Never  . Sexual activity: Yes    Birth control/protection: Surgical  Other Topics Concern  . Not on file  Social History Narrative  . Not on file   Social Determinants of  Health   Financial Resource Strain: Not on file  Food Insecurity: Not on file  Transportation Needs: Not on file  Physical Activity: Not on file  Stress: Not on file  Social Connections: Not on file  Intimate Partner Violence: Not on file    Family History  Problem Relation Age of Onset  . Diabetes Maternal Grandmother   . Diabetes Paternal Grandmother   . Anxiety disorder Mother   . Psoriasis Sister   . Heart attack Brother 34       deceased from this  . Anxiety disorder Brother   . Cirrhosis Maternal Grandfather        alcohol related  . Allergy (severe) Son   . Healthy Son   . Colon cancer Neg Hx   . Esophageal cancer Neg Hx   . Colon polyps Neg Hx   . Stomach cancer Neg Hx   . Pancreatic cancer Neg Hx      Review of Systems  Constitutional: Positive for malaise/fatigue. Negative for chills and fever.  HENT: Negative.  Negative for congestion and sore throat.   Respiratory: Negative.  Negative  for cough and shortness of breath.   Cardiovascular: Negative.  Negative for chest pain and palpitations.  Gastrointestinal: Negative.  Negative for abdominal pain, blood in stool, diarrhea, melena, nausea and vomiting.  Genitourinary: Negative.  Negative for dysuria and hematuria.  Musculoskeletal: Negative.  Negative for back pain and myalgias.  Skin: Positive for rash (Chronic psoriasis rash).  Neurological: Negative.  Negative for dizziness and headaches.  All other systems reviewed and are negative.     Today's Vitals   10/28/20 0927  BP: 118/76  Pulse: (!) 104  Temp: 98.8 F (37.1 C)  TempSrc: Oral  SpO2: 98%  Weight: 256 lb 12.8 oz (116.5 kg)  Height: 5' 5"  (1.651 m)   Body mass index is 42.73 kg/m. Wt Readings from Last 3 Encounters:  10/28/20 256 lb 12.8 oz (116.5 kg)  10/26/20 259 lb 9.6 oz (117.8 kg)  09/28/20 254 lb 9.6 oz (115.5 kg)    Physical Exam Vitals reviewed.  Constitutional:      Appearance: Normal appearance.  HENT:     Head: Normocephalic.     Mouth/Throat:     Mouth: Mucous membranes are moist.     Pharynx: Oropharynx is clear.  Eyes:     Extraocular Movements: Extraocular movements intact.     Conjunctiva/sclera: Conjunctivae normal.     Pupils: Pupils are equal, round, and reactive to light.  Cardiovascular:     Rate and Rhythm: Normal rate and regular rhythm.     Pulses: Normal pulses.     Heart sounds: Normal heart sounds.  Pulmonary:     Effort: Pulmonary effort is normal.     Breath sounds: Normal breath sounds.  Musculoskeletal:        General: Normal range of motion.     Cervical back: Normal range of motion and neck supple. No tenderness.  Lymphadenopathy:     Cervical: No cervical adenopathy.  Skin:    General: Skin is warm and dry.     Capillary Refill: Capillary refill takes less than 2 seconds.  Neurological:     General: No focal deficit present.     Mental Status: She is alert and oriented to person, place, and time.   Psychiatric:        Mood and Affect: Mood normal.        Behavior: Behavior normal.    A total  of 30 minutes was spent with the patient, greater than 50% of which was in counseling/coordination of care regarding differential diagnosis of chronic fatigue and malaise, need for blood work and diagnostic work-up, review of all medications, review of most recent office visit notes, review of most recent blood work results, education and nutrition, need for chronic pain management evaluation, health maintenance items, documentation, prognosis, and need for follow-up.   ASSESSMENT & PLAN: Clinically stable.  Chronic fatigue and malaise multifactorial.  We will do blood work today.  Probably low slow metabolism status.  Diet and nutrition discussed with patient.  Chronic pain management discussed.  Medication list reviewed with patient and side effects discussed.  Will refer to pain management clinic. Bronda was seen today for follow-up, back pain and fatigue.  Diagnoses and all orders for this visit:  Essential hypertension  Primary osteoarthritis involving multiple joints  Generalized anxiety disorder  Psoriatic arthritis (HCC)  OSA on CPAP  Chronic pain syndrome -     Ambulatory referral to Pain Clinic  Chronic fatigue and malaise -     CBC with Differential/Platelet -     Comprehensive metabolic panel -     Hemoglobin A1c -     TSH -     Lipid panel     Patient Instructions   Health Maintenance, Female Adopting a healthy lifestyle and getting preventive care are important in promoting health and wellness. Ask your health care provider about:  The right schedule for you to have regular tests and exams.  Things you can do on your own to prevent diseases and keep yourself healthy. What should I know about diet, weight, and exercise? Eat a healthy diet  Eat a diet that includes plenty of vegetables, fruits, low-fat dairy products, and lean protein.  Do not eat a lot of foods  that are high in solid fats, added sugars, or sodium.   Maintain a healthy weight Body mass index (BMI) is used to identify weight problems. It estimates body fat based on height and weight. Your health care provider can help determine your BMI and help you achieve or maintain a healthy weight. Get regular exercise Get regular exercise. This is one of the most important things you can do for your health. Most adults should:  Exercise for at least 150 minutes each week. The exercise should increase your heart rate and make you sweat (moderate-intensity exercise).  Do strengthening exercises at least twice a week. This is in addition to the moderate-intensity exercise.  Spend less time sitting. Even light physical activity can be beneficial. Watch cholesterol and blood lipids Have your blood tested for lipids and cholesterol at 42 years of age, then have this test every 5 years. Have your cholesterol levels checked more often if:  Your lipid or cholesterol levels are high.  You are older than 42 years of age.  You are at high risk for heart disease. What should I know about cancer screening? Depending on your health history and family history, you may need to have cancer screening at various ages. This may include screening for:  Breast cancer.  Cervical cancer.  Colorectal cancer.  Skin cancer.  Lung cancer. What should I know about heart disease, diabetes, and high blood pressure? Blood pressure and heart disease  High blood pressure causes heart disease and increases the risk of stroke. This is more likely to develop in people who have high blood pressure readings, are of African descent, or are overweight.  Have your  blood pressure checked: ? Every 3-5 years if you are 75-81 years of age. ? Every year if you are 53 years old or older. Diabetes Have regular diabetes screenings. This checks your fasting blood sugar level. Have the screening done:  Once every three years  after age 15 if you are at a normal weight and have a low risk for diabetes.  More often and at a younger age if you are overweight or have a high risk for diabetes. What should I know about preventing infection? Hepatitis B If you have a higher risk for hepatitis B, you should be screened for this virus. Talk with your health care provider to find out if you are at risk for hepatitis B infection. Hepatitis C Testing is recommended for:  Everyone born from [birth year] through 1965.  Anyone with known risk factors for hepatitis C. Sexually transmitted infections (STIs)  Get screened for STIs, including gonorrhea and chlamydia, if: ? You are sexually active and are younger than 42 years of age. ? You are older than 42 years of age and your health care provider tells you that you are at risk for this type of infection. ? Your sexual activity has changed since you were last screened, and you are at increased risk for chlamydia or gonorrhea. Ask your health care provider if you are at risk.  Ask your health care provider about whether you are at high risk for HIV. Your health care provider may recommend a prescription medicine to help prevent HIV infection. If you choose to take medicine to prevent HIV, you should first get tested for HIV. You should then be tested every 3 months for as long as you are taking the medicine. Pregnancy  If you are about to stop having your period (premenopausal) and you may become pregnant, seek counseling before you get pregnant.  Take 400 to 800 micrograms (mcg) of folic acid every day if you become pregnant.  Ask for birth control (contraception) if you want to prevent pregnancy. Osteoporosis and menopause Osteoporosis is a disease in which the bones lose minerals and strength with aging. This can result in bone fractures. If you are 2 years old or older, or if you are at risk for osteoporosis and fractures, ask your health care provider if you should:  Be  screened for bone loss.  Take a calcium or vitamin D supplement to lower your risk of fractures.  Be given hormone replacement therapy (HRT) to treat symptoms of menopause. Follow these instructions at home: Lifestyle  Do not use any products that contain nicotine or tobacco, such as cigarettes, e-cigarettes, and chewing tobacco. If you need help quitting, ask your health care provider.  Do not use street drugs.  Do not share needles.  Ask your health care provider for help if you need support or information about quitting drugs. Alcohol use  Do not drink alcohol if: ? Your health care provider tells you not to drink. ? You are pregnant, may be pregnant, or are planning to become pregnant.  If you drink alcohol: ? Limit how much you use to 0-1 drink a day. ? Limit intake if you are breastfeeding.  Be aware of how much alcohol is in your drink. In the [place] one drink equals one 12 oz bottle of beer (355 mL), one 5 oz glass of wine (148 mL), or one 1 oz glass of hard liquor (44 mL). General instructions  Schedule regular health, dental, and eye exams.  Stay current with  your vaccines.  Tell your health care provider if: ? You often feel depressed. ? You have ever been abused or do not feel safe at home. Summary  Adopting a healthy lifestyle and getting preventive care are important in promoting health and wellness.  Follow your health care provider's instructions about healthy diet, exercising, and getting tested or screened for diseases.  Follow your health care provider's instructions on monitoring your cholesterol and blood pressure. This information is not intended to replace advice given to you by your health care provider. Make sure you discuss any questions you have with your health care provider. Document Revised: 06/19/2018 Document Reviewed: 06/19/2018 Elsevier Patient Education  2021 [place], MD [facility]

## 2020-10-28 NOTE — Patient Instructions (Signed)
Health Maintenance, Female Adopting a healthy lifestyle and getting preventive care are important in promoting health and wellness. Ask your health care provider about:  The right schedule for you to have regular tests and exams.  Things you can do on your own to prevent diseases and keep yourself healthy. What should I know about diet, weight, and exercise? Eat a healthy diet  Eat a diet that includes plenty of vegetables, fruits, low-fat dairy products, and lean protein.  Do not eat a lot of foods that are high in solid fats, added sugars, or sodium.   Maintain a healthy weight Body mass index (BMI) is used to identify weight problems. It estimates body fat based on height and weight. Your health care provider can help determine your BMI and help you achieve or maintain a healthy weight. Get regular exercise Get regular exercise. This is one of the most important things you can do for your health. Most adults should:  Exercise for at least 150 minutes each week. The exercise should increase your heart rate and make you sweat (moderate-intensity exercise).  Do strengthening exercises at least twice a week. This is in addition to the moderate-intensity exercise.  Spend less time sitting. Even light physical activity can be beneficial. Watch cholesterol and blood lipids Have your blood tested for lipids and cholesterol at 42 years of age, then have this test every 5 years. Have your cholesterol levels checked more often if:  Your lipid or cholesterol levels are high.  You are older than 42 years of age.  You are at high risk for heart disease. What should I know about cancer screening? Depending on your health history and family history, you may need to have cancer screening at various ages. This may include screening for:  Breast cancer.  Cervical cancer.  Colorectal cancer.  Skin cancer.  Lung cancer. What should I know about heart disease, diabetes, and high blood  pressure? Blood pressure and heart disease  High blood pressure causes heart disease and increases the risk of stroke. This is more likely to develop in people who have high blood pressure readings, are of African descent, or are overweight.  Have your blood pressure checked: ? Every 3-5 years if you are 18-39 years of age. ? Every year if you are 40 years old or older. Diabetes Have regular diabetes screenings. This checks your fasting blood sugar level. Have the screening done:  Once every three years after age 40 if you are at a normal weight and have a low risk for diabetes.  More often and at a younger age if you are overweight or have a high risk for diabetes. What should I know about preventing infection? Hepatitis B If you have a higher risk for hepatitis B, you should be screened for this virus. Talk with your health care provider to find out if you are at risk for hepatitis B infection. Hepatitis C Testing is recommended for:  Everyone born from 1945 through 1965.  Anyone with known risk factors for hepatitis C. Sexually transmitted infections (STIs)  Get screened for STIs, including gonorrhea and chlamydia, if: ? You are sexually active and are younger than 42 years of age. ? You are older than 42 years of age and your health care provider tells you that you are at risk for this type of infection. ? Your sexual activity has changed since you were last screened, and you are at increased risk for chlamydia or gonorrhea. Ask your health care provider   if you are at risk.  Ask your health care provider about whether you are at high risk for HIV. Your health care provider may recommend a prescription medicine to help prevent HIV infection. If you choose to take medicine to prevent HIV, you should first get tested for HIV. You should then be tested every 3 months for as long as you are taking the medicine. Pregnancy  If you are about to stop having your period (premenopausal) and  you may become pregnant, seek counseling before you get pregnant.  Take 400 to 800 micrograms (mcg) of folic acid every day if you become pregnant.  Ask for birth control (contraception) if you want to prevent pregnancy. Osteoporosis and menopause Osteoporosis is a disease in which the bones lose minerals and strength with aging. This can result in bone fractures. If you are 65 years old or older, or if you are at risk for osteoporosis and fractures, ask your health care provider if you should:  Be screened for bone loss.  Take a calcium or vitamin D supplement to lower your risk of fractures.  Be given hormone replacement therapy (HRT) to treat symptoms of menopause. Follow these instructions at home: Lifestyle  Do not use any products that contain nicotine or tobacco, such as cigarettes, e-cigarettes, and chewing tobacco. If you need help quitting, ask your health care provider.  Do not use street drugs.  Do not share needles.  Ask your health care provider for help if you need support or information about quitting drugs. Alcohol use  Do not drink alcohol if: ? Your health care provider tells you not to drink. ? You are pregnant, may be pregnant, or are planning to become pregnant.  If you drink alcohol: ? Limit how much you use to 0-1 drink a day. ? Limit intake if you are breastfeeding.  Be aware of how much alcohol is in your drink. In the [place] one drink equals one 12 oz bottle of beer (355 mL), one 5 oz glass of wine (148 mL), or one 1 oz glass of hard liquor (44 mL). General instructions  Schedule regular health, dental, and eye exams.  Stay current with your vaccines.  Tell your health care provider if: ? You often feel depressed. ? You have ever been abused or do not feel safe at home. Summary  Adopting a healthy lifestyle and getting preventive care are important in promoting health and wellness.  Follow your health care provider's instructions about healthy  diet, exercising, and getting tested or screened for diseases.  Follow your health care provider's instructions on monitoring your cholesterol and blood pressure. This information is not intended to replace advice given to you by your health care provider. Make sure you discuss any questions you have with your health care provider. Document Revised: 06/19/2018 Document Reviewed: 06/19/2018 Elsevier Patient Education  2021 [employer]  

## 2020-10-30 ENCOUNTER — Other Ambulatory Visit: Payer: Self-pay | Admitting: Emergency Medicine

## 2020-10-30 DIAGNOSIS — R7303 Prediabetes: Secondary | ICD-10-CM | POA: Insufficient documentation

## 2020-10-30 DIAGNOSIS — E785 Hyperlipidemia, unspecified: Secondary | ICD-10-CM | POA: Insufficient documentation

## 2020-10-30 MED ORDER — ROSUVASTATIN CALCIUM 10 MG PO TABS: 10.0000 mg | ORAL_TABLET | Freq: Every day | ORAL | 3 refills | Status: DC

## 2020-10-30 MED ORDER — METFORMIN HCL 500 MG PO TABS: 500.0000 mg | ORAL_TABLET | Freq: Two times a day (BID) | ORAL | 3 refills | Status: DC

## 2020-11-03 ENCOUNTER — Ambulatory Visit: Payer: 59 | Admitting: Emergency Medicine

## 2020-11-04 ENCOUNTER — Other Ambulatory Visit: Payer: Self-pay

## 2020-11-04 ENCOUNTER — Ambulatory Visit (INDEPENDENT_AMBULATORY_CARE_PROVIDER_SITE_OTHER): Payer: 59 | Admitting: Podiatry

## 2020-11-04 DIAGNOSIS — M722 Plantar fascial fibromatosis: Secondary | ICD-10-CM

## 2020-11-04 DIAGNOSIS — M84362A Stress fracture, left tibia, initial encounter for fracture: Secondary | ICD-10-CM

## 2020-11-04 MED ORDER — CICLOPIROX 8 % EX SOLN: Freq: Every day | CUTANEOUS | 2 refills | Status: DC

## 2020-11-04 MED ORDER — TRIAMCINOLONE ACETONIDE 10 MG/ML IJ SUSP
10.0000 mg | Freq: Once | INTRAMUSCULAR | Status: AC
  Administered 2020-11-04: 10 mg

## 2020-11-04 NOTE — Progress Notes (Signed)
Subjective: 40 year old female presents the office today for follow-up evaluation of left heel pain, plantar fasciitis as well as ankle discomfort.  States that she has been try to wear the cam boot but she had a recent surgery assessment difficult for her to wear.  Otherwise she states that she has good days and bad days.  No swelling to the ankle majority discomfort saw the bottom of the heel.  No recent injury or falls or injuries otherwise and she has no other concerns.  Denies any systemic complaints such as fevers, chills, nausea, vomiting. No acute changes since last appointment, and no other complaints at this time.   Objective: AAO x3, NAD DP/PT pulses palpable bilaterally, CRT less than 3 seconds Majority of tenderness is localized along the plantar medial tubercle of the calcaneus at the insertion of plantar fascia.  The plantar fascia appears to be intact.  There is no pain with lateral compression of calcaneus.  There is no edema, erythema.  Negative Tinel sign.  No significant discomfort of the ankle particular is no pain on the medial malleolus there is no edema to this area.  Ankle, subtalar joint range of motion intact without any restriction.  Tendons appear to be intact.  No pain in the Achilles tendon.  MMT 5/5. No pain with calf compression, swelling, warmth, erythema  Assessment: Rule out plantar fascial tear, stress reaction tibia  Plan: -All treatment options discussed with the patient including all alternatives, risks, complications.  -I recommend immobilization in the cam boot.  She already has this at home.  I did do a steroid injection today for the plantar fascial.  See procedure note below. -We discussed treatment options both conservative as well as surgical.  Continue with conservative treatment.  Discussed possibly doing PRP injection in the office.   Procedure: Injection Tendon/Ligament Discussed alternatives, risks, complications and verbal consent was obtained.   Location: Left plantar fascia at the glabrous junction; medial approach. Skin Prep: Alcohol. Injectate: 0.5cc 0.5% marcaine plain, 0.5 cc 2% lidocaine plain and, 1 cc kenalog 10. Disposition: Patient tolerated procedure well. Injection site dressed with a band-aid.  Post-injection care was discussed and return precautions discussed.    Return in about 3 weeks (around 11/25/2020) for plantar fasciitis, x-ray ankle.  Trula Slade DPM

## 2020-11-05 ENCOUNTER — Encounter: Payer: Self-pay | Admitting: Obstetrics & Gynecology

## 2020-11-05 ENCOUNTER — Ambulatory Visit (INDEPENDENT_AMBULATORY_CARE_PROVIDER_SITE_OTHER): Payer: 59 | Admitting: Obstetrics & Gynecology

## 2020-11-05 VITALS — BP 130/84

## 2020-11-05 DIAGNOSIS — Z09 Encounter for follow-up examination after completed treatment for conditions other than malignant neoplasm: Secondary | ICD-10-CM

## 2020-11-05 DIAGNOSIS — E8941 Symptomatic postprocedural ovarian failure: Secondary | ICD-10-CM | POA: Diagnosis not present

## 2020-11-05 MED ORDER — ESTRADIOL 0.05 MG/24HR TD PTTW: 1.0000 | MEDICATED_PATCH | TRANSDERMAL | 4 refills | Status: DC

## 2020-11-05 NOTE — Progress Notes (Signed)
    Theresa Sawyer 13-Jan-1981 [id number]        40 y.o.  [id number]   RP: Post op XI Robotic TLH/BSO on 09/28/2020  HPI: Much improved feeling of bloatedness.  Incisions well-healed and no vaginal bleeding.  Eating well, gas positive and normal bowel movements.  Urine normal.  No fever.  Complains of hot flashes and night sweats.  Would like to start on hormone replacement therapy.   OB History  Gravida Para Term Preterm AB Living  3 1 1  0 2 1  SAB IAB Ectopic Multiple Live Births  1 1 0 0 1    # Outcome Date GA Lbr Len/2nd Weight Sex Delivery Anes PTL Lv  3 Term 02/04/13 [id number] / 03:55 7 lb 10.8 oz (3.481 kg) M Vag-Spont EPI  LIV     Birth Comments: WNL  2 SAB         DEC  1 IAB         DEC    Past medical history,surgical history, problem list, medications, allergies, family history and social history were all reviewed and documented in the EPIC chart.   Directed ROS with pertinent positives and negatives documented in the history of present illness/assessment and plan.  Exam:  Vitals:   11/05/20 1145  BP: 130/84   General appearance:  Normal  Abdomen: Incisions well healed  Gynecologic exam: Vulva normal.  Speculum:  Vaginal vault well closed.     Assessment/Plan:  40y.o. G3P1021   1. Status post gynecological surgery, follow-up exam Slow but good postop healing.  No complication.  Progressive return to work at 6 wks postop, full return at 8 weeks postop.  2. Surgical menopause, symptomatic Very symptomatic surgical menopause.  No contraindication to hormone replacement therapy.  Benefits/risks of hormone replacement therapy thoroughly reviewed with patient.  Usage reviewed.  Estradiol patch 0.05 twice a week prescribed.  Other orders - estradiol (VIVELLE-DOT) 0.05 MG/24HR patch; Place 1 patch (0.05 mg total) onto the skin 2 (two) times a week.  MPrincess BruinsMD, 12:26 PM 11/05/2020

## 2020-11-06 ENCOUNTER — Encounter: Payer: Self-pay | Admitting: Obstetrics & Gynecology

## 2020-11-07 ENCOUNTER — Other Ambulatory Visit: Payer: Self-pay | Admitting: Family Medicine

## 2020-11-07 DIAGNOSIS — M545 Low back pain, unspecified: Secondary | ICD-10-CM

## 2020-11-07 DIAGNOSIS — G8929 Other chronic pain: Secondary | ICD-10-CM

## 2020-11-08 ENCOUNTER — Encounter: Payer: Self-pay | Admitting: Obstetrics & Gynecology

## 2020-11-12 ENCOUNTER — Ambulatory Visit: Payer: 59 | Admitting: Obstetrics & Gynecology

## 2020-11-19 ENCOUNTER — Other Ambulatory Visit: Payer: Self-pay | Admitting: Physician Assistant

## 2020-11-19 ENCOUNTER — Ambulatory Visit
Admission: RE | Admit: 2020-11-19 | Discharge: 2020-11-19 | Disposition: A | Discharge status: Home / Self Care | Payer: 59 | Source: Ambulatory Visit | Attending: Physician Assistant | Admitting: Physician Assistant

## 2020-11-19 DIAGNOSIS — M542 Cervicalgia: Secondary | ICD-10-CM

## 2020-11-19 DIAGNOSIS — M546 Pain in thoracic spine: Secondary | ICD-10-CM

## 2020-11-19 DIAGNOSIS — M5459 Other low back pain: Secondary | ICD-10-CM

## 2020-11-19 DIAGNOSIS — M25561 Pain in right knee: Secondary | ICD-10-CM

## 2020-11-19 DIAGNOSIS — M25562 Pain in left knee: Secondary | ICD-10-CM

## 2020-11-23 ENCOUNTER — Ambulatory Visit: Payer: 59 | Admitting: Podiatry

## 2020-12-14 ENCOUNTER — Ambulatory Visit: Payer: 59 | Admitting: Podiatry

## 2020-12-21 ENCOUNTER — Encounter: Payer: 59 | Admitting: Dietician

## 2021-01-13 ENCOUNTER — Telehealth: Payer: Self-pay

## 2021-01-13 NOTE — Telephone Encounter (Signed)
On Estradiol patch 0.3m   C/o Irritable, hotflashes,nightsweats, mood swings and forgetfulness.  She feels like her dose needs to be increased.

## 2021-01-14 MED ORDER — ESTRADIOL 0.1 MG/24HR TD PTTW: 1.0000 | MEDICATED_PATCH | TRANSDERMAL | 3 refills | Status: DC

## 2021-01-14 NOTE — Telephone Encounter (Signed)
Dr.Lavoie replied "Agree to increase to 0.075 or 0.1 at patient's preference. "  Rx sent, preference is 0.62m patch. Patient aware.

## 2021-02-08 ENCOUNTER — Other Ambulatory Visit: Payer: Self-pay | Admitting: Emergency Medicine

## 2021-02-08 DIAGNOSIS — F411 Generalized anxiety disorder: Secondary | ICD-10-CM

## 2021-02-25 ENCOUNTER — Other Ambulatory Visit: Payer: Self-pay | Admitting: Physician Assistant

## 2021-02-25 DIAGNOSIS — M545 Low back pain, unspecified: Secondary | ICD-10-CM

## 2021-02-25 DIAGNOSIS — M79652 Pain in left thigh: Secondary | ICD-10-CM

## 2021-02-25 DIAGNOSIS — M79651 Pain in right thigh: Secondary | ICD-10-CM

## 2021-03-13 ENCOUNTER — Inpatient Hospital Stay: Admission: RE | Admit: 2021-03-13 | Payer: 59 | Source: Ambulatory Visit

## 2021-03-17 ENCOUNTER — Encounter: Payer: Self-pay | Admitting: Family Medicine

## 2021-03-17 ENCOUNTER — Telehealth (INDEPENDENT_AMBULATORY_CARE_PROVIDER_SITE_OTHER): Payer: 59 | Admitting: Family Medicine

## 2021-03-17 DIAGNOSIS — U071 COVID-19: Secondary | ICD-10-CM

## 2021-03-17 MED ORDER — MOLNUPIRAVIR EUA 200MG CAPSULE: 4.0000 | ORAL_CAPSULE | Freq: Two times a day (BID) | ORAL | 0 refills | Status: AC

## 2021-03-17 MED ORDER — BENZONATATE 100 MG PO CAPS
100.0000 mg | ORAL_CAPSULE | Freq: Three times a day (TID) | ORAL | 0 refills | Status: DC | PRN
Start: 2021-03-17 — End: 2021-10-24

## 2021-03-17 NOTE — Progress Notes (Signed)
Virtual Visit via Video Note  I connected with Theresa Sawyer  on 03/17/21 at  5:40 PM EDT by a video enabled telemedicine application and verified that I am speaking with the correct person using two identifiers.  Location patient: home, [employer] Location provider:work or home office Persons participating in the virtual visit: patient, provider  I discussed the limitations of evaluation and management by telemedicine and the availability of in person appointments. The patient expressed understanding and agreed to proceed.   HPI:  Acute telemedicine visit for Covid19: -Onset: 3 days ago; home test was positive -son tested positive 3 days ago -Symptoms include: nasal congestion, cough, headache, emesis, eyes have been mucky, fever initially, diarrhea -Denies: CP, SOB, inability to eat/drink/get out of bed -Pertinent past medical history:see below and obesity -Pertinent medication allergies:  Allergies  Allergen Reactions   Nsaids     Do not take per rheumatologist  -COVID-19 vaccine status: not vaccinated -GFR 91 -reports she is also on a narcotic and needs it right now because of the pain issues - sees a pain specialist - this is not on her medication list -s/p hysterectomy -she does not wish to adjust medications   ROS: See pertinent positives and negatives per HPI.  Past Medical History:  Diagnosis Date   Abnormal uterine bleeding (AUB)    Anxiety    Arthritis    oa   Depression    GERD (gastroesophageal reflux disease)    History of chest pain (10-20-2019  pt denies any cardiac s&s)   mutliple ED visits, atypical chest pain, chest wall pain, muscularskeletal chest pain;  pt had cardiology evaulation dated 04-23-2018 note in epic , state atypical chest wall pain with negative d dimer and troponin's multiple times in epic, suggested CTA  (pt did not get done)   Hyperlipidemia    Hypertension    IDA (iron deficiency anemia)    none recently   Menorrhagia    Migraine    OSA on CPAP     followed by dr ather cpap set on 7 to 14   Psoriasis    Urinary frequency     Past Surgical History:  Procedure Laterality Date   DILATATION & CURETTAGE/HYSTEROSCOPY WITH MYOSURE N/A 10/24/2019   Procedure: DILATATION & CURETTAGE/HYSTEROSCOPY WITH MYOSURE;  Surgeon: Joseph Pierini, MD;  Location: [place];  Service: Gynecology;  Laterality: N/A;   HYSTEROSCOPY WITH NOVASURE N/A 04/06/2020   Procedure: DILATION AND CURRETTAGE; NOVASURE ABLATION;  Surgeon: Joseph Pierini, MD;  Location: [place];  Service: Gynecology;  Laterality: N/A;   LAPAROSCOPY N/A 04/06/2020   Procedure: LAPAROSCOPY OPERATIVE; LYSIS OF ADHESION;  Surgeon: Joseph Pierini, MD;  Location: [place];  Service: Gynecology;  Laterality: N/A;   ROBOTIC ASSISTED TOTAL HYSTERECTOMY WITH BILATERAL SALPINGO OOPHERECTOMY Bilateral 09/28/2020   Procedure: XI ROBOTIC ASSISTED TOTAL LAPAROSCOPIC HYSTERECTOMY WITH BILATERAL SALPINGO OOPHORECTOMY;  Surgeon: Princess Bruins, MD;  Location: [place];  Service: Gynecology;  Laterality: Bilateral;  request 8:30am OR start time in [place] held time for Dr. Dellis Filbert requests 2 hours [place] 2/10 W/NICK FOR  TRACY (RNFA) TO ASSIST   TUBAL LIGATION Bilateral 02/05/2013   Procedure: POST PARTUM TUBAL LIGATION;  Surgeon: Woodroe Mode, MD;  Location: [facility];  Service: Gynecology;  Laterality: Bilateral;    [place] clips     Current Outpatient Medications:    ALPRAZolam (XANAX) 0.5 MG tablet, TAKE 1 TABLET BY MOUTH EVERY DAY AS NEEDED FOR ANXIETY, Disp: 30  tablet, Rfl: 1   benzonatate (TESSALON PERLES) 100 MG capsule, Take 1 capsule (100 mg total) by mouth 3 (three) times daily as needed., Disp: 20 capsule, Rfl: 0   escitalopram (LEXAPRO) 20 MG tablet, TAKE 1 TABLET(20 MG) BY MOUTH DAILY, Disp: 90 tablet, Rfl: 3   estradiol (VIVELLE-DOT) 0.1 MG/24HR patch, Place 1 patch (0.1 mg total) onto the skin 2 (two) times a  week., Disp: 24 patch, Rfl: 3   famotidine (PEPCID) 40 MG tablet, TAKE 1 TABLET(40 MG) BY MOUTH TWICE DAILY, Disp: 60 tablet, Rfl: 3   hydrochlorothiazide (HYDRODIURIL) 25 MG tablet, Take 1 tablet (25 mg total) by mouth daily., Disp: 90 tablet, Rfl: 3   metFORMIN (GLUCOPHAGE) 500 MG tablet, Take 1 tablet (500 mg total) by mouth 2 (two) times daily with a meal., Disp: 180 tablet, Rfl: 3   molnupiravir EUA 200 mg CAPS, Take 4 capsules (800 mg total) by mouth 2 (two) times daily for 5 days., Disp: 40 capsule, Rfl: 0   rosuvastatin (CRESTOR) 10 MG tablet, Take 1 tablet (10 mg total) by mouth daily., Disp: 90 tablet, Rfl: 3   triamcinolone cream (KENALOG) 0.1 %, Apply 1 application topically as needed. , Disp: , Rfl:    valsartan (DIOVAN) 80 MG tablet, Take 1.5 tablets (120 mg total) by mouth daily. (Patient taking differently: Take 120 mg by mouth at bedtime.), Disp: 90 tablet, Rfl: 3   Vitamin D, Ergocalciferol, (DRISDOL) 1.25 MG (50000 UNIT) CAPS capsule, Take 50,000 Units by mouth once a week. sunday, Disp: , Rfl:   EXAM:  VITALS per patient if applicable:  GENERAL: alert, oriented, appears well and in no acute distress  HEENT: atraumatic, conjunttiva clear, no obvious abnormalities on inspection of external nose and ears  NECK: normal movements of the head and neck  LUNGS: on inspection no signs of respiratory distress, breathing rate appears normal, no obvious gross SOB, gasping or wheezing  CV: no obvious cyanosis  MS: moves all visible extremities without noticeable abnormality  PSYCH/NEURO: pleasant and cooperative, no obvious depression or anxiety, speech and thought processing grossly intact  ASSESSMENT AND PLAN:  Discussed the following assessment and plan:  COVID-19   Discussed treatment options (infusions and oral options and risk of drug interactions), ideal treatment window, potential complications, isolation and precautions for COVID-19.  Discussed possibility of rebound  with antivirals and the need to reisolate if it should occur for 5 days. Checked for/reviewed any labs done in the last 90 days with GFR listed in HPI if available.  After lengthy discussion, the patient opted for treatment with molnupiravir due to being higher risk for complications of covid or severe disease and other factors. Discussed EUA status of this drug and the fact that there is preliminary limited knowledge of risks/interactions/side effects per EUA document vs possible benefits and precautions. This information was shared with patient during the visit and also was provided in patient instructions. Also, advised that patient discuss risks/interactions and use with pharmacist/treatment team as well. The patient did want a prescription for cough, Tessalon Rx sent.  Other symptomatic care measures summarized in patient instructions. Work/School slipped offered:  declined Advised to seek prompt in person care if worsening, new symptoms arise, or if is not improving with treatment. Discussed options for inperson care if PCP office not available. Did let this patient know that I only do telemedicine on Tuesdays and Thursdays for [place]. Advised to schedule follow up visit with PCP or [facility] if any further questions or concerns  to avoid delays in care.   I discussed the assessment and treatment plan with the patient. The patient was provided an opportunity to ask questions and all were answered. The patient agreed with the plan and demonstrated an understanding of the instructions.     Lucretia Kern, DO

## 2021-03-17 NOTE — Patient Instructions (Signed)
HOME CARE TIPS:  -I sent the medication(s) we discussed to your pharmacy: Meds ordered this encounter  Medications   molnupiravir EUA 200 mg CAPS    Sig: Take 4 capsules (800 mg total) by mouth 2 (two) times daily for 5 days.    Dispense:  40 capsule    Refill:  0   benzonatate (TESSALON PERLES) 100 MG capsule    Sig: Take 1 capsule (100 mg total) by mouth 3 (three) times daily as needed.    Dispense:  20 capsule    Refill:  0     -I sent in the [place] treatment or referral you requested per our discussion. Please see the information provided below and discuss further with the pharmacist/treatment team.   -If taking an antiviral, there is a chance of rebound illness after finishing your treatment. If you become sick again please isolate for an additional 5 days.   -can use nasal saline a few times per day if you have nasal congestion  -stay hydrated, drink plenty of fluids and eat small healthy meals - avoid dairy  -follow up with your doctor in 2-3 days unless improving and feeling better  -stay home while sick, except to seek medical care. If you have COVID19, ideally it would be best to stay home for a full 10 days since the onset of symptoms PLUS one day of no fever and feeling better. Wear a good mask that fits snugly (such as N95 or KN95) if around others to reduce the risk of transmission.  It was nice to meet you today, and I really hope you are feeling better soon. I help [facility] out with telemedicine visits on Tuesdays and Thursdays and am available for visits on those days. If you have any concerns or questions following this visit please schedule a follow up visit with your Primary Care doctor or seek care at a local urgent care clinic to avoid delays in care.    Seek in person care or schedule a follow up video visit promptly if your symptoms worsen, new concerns arise or you are not improving with treatment. Call 911 and/or seek emergency care if your symptoms are  severe or life threatening.     Fact Sheet for Patients And Caregivers Emergency Use Authorization (EUA) Of LAGEVRIOT (molnupiravir) capsules For Coronavirus Disease 2019 (COVID-19)  What is the most important information I should know about LAGEVRIO? LAGEVRIO may cause serious side effects, including: ? LAGEVRIO may cause harm to your unborn baby. It is not known if LAGEVRIO will harm your baby if you take LAGEVRIO during pregnancy. o LAGEVRIO is not recommended for use in pregnancy. o LAGEVRIO has not been studied in pregnancy. LAGEVRIO was studied in pregnant animals only. When LAGEVRIO was given to pregnant animals, LAGEVRIO caused harm to their unborn babies. o You and your healthcare provider may decide that you should take LAGEVRIO during pregnancy if there are no other COVID-19 treatment options approved or authorized by the FDA that are accessible or clinically appropriate for you. o If you and your healthcare provider decide that you should take LAGEVRIO during pregnancy, you and your healthcare provider should discuss the known and potential benefits and the potential risks of taking LAGEVRIO during pregnancy. For individuals who are able to become pregnant: ? You should use a reliable method of birth control (contraception) consistently and correctly during treatment with LAGEVRIO and for 4 days after the last dose of LAGEVRIO. Talk to your healthcare provider about reliable birth  control methods. ? Before starting treatment with [facility] your healthcare provider may do a pregnancy test to see if you are pregnant before starting treatment with LAGEVRIO. ? Tell your healthcare provider right away if you become pregnant or think you may be pregnant during treatment with LAGEVRIO. Pregnancy Surveillance Program: ? There is a pregnancy surveillance program for individuals who take LAGEVRIO during pregnancy. The purpose of this program is to collect information about the health  of you and your baby. Talk to your healthcare provider about how to take part in this program. ? If you take LAGEVRIO during pregnancy and you agree to participate in the pregnancy surveillance program and allow your healthcare provider to share your information with [place], then your healthcare provider will report your use of [place] during pregnancy to [place]. by calling [phone] or [email or web address]. For individuals who are sexually active with partners who are able to become pregnant: ? It is not known if LAGEVRIO can affect sperm. While the risk is regarded as low, animal studies to fully assess the potential for LAGEVRIO to affect the babies of males treated with LAGEVRIO have not been completed. A reliable method of birth control (contraception) should be used consistently and correctly during treatment with LAGEVRIO and for at least 3 months after the last dose. The risk to sperm beyond 3 months is not known. Studies to understand the risk to sperm beyond 3 months are ongoing. Talk to your healthcare provider about reliable birth control methods. Talk to your healthcare provider if you have questions or concerns about how LAGEVRIO may affect sperm. You are being given this fact sheet because your healthcare provider believes it is necessary to provide you with LAGEVRIO for the treatment of adults with mild-to-moderate coronavirus disease 2019 (COVID-19) with positive results of direct SARS-CoV-2 viral testing, and who are at high risk for progression to severe COVID-19 including hospitalization or death, and for whom other COVID-19 treatment options approved or authorized by the FDA are not accessible or clinically appropriate. The U.S. Food and Drug Administration (FDA) has issued an Emergency Use Authorization (EUA) to make LAGEVRIO available during the COVID-19 pandemic (for more details about an EUA please see "What is an Emergency  Use Authorization?" at the end of this document). LAGEVRIO is not an FDA-approved medicine in the [place]. Read this Fact Sheet for information about LAGEVRIO. Talk to your healthcare provider about your options if you have any questions. It is your choice to take LAGEVRIO.  What is COVID-19? COVID-19 is caused by a virus called a coronavirus. You can get COVID-19 through close contact with another person who has the virus. COVID-19 illnesses have ranged from very mild-to-severe, including illness resulting in death. While information so far suggests that most COVID-19 illness is mild, serious illness can happen and may cause some of your other medical conditions to become worse. Older people and people of all ages with severe, long lasting (chronic) medical conditions like heart disease, lung disease and diabetes, for example seem to be at higher risk of being hospitalized for COVID-19.  What is LAGEVRIO? LAGEVRIO is an investigational medicine used to treat mild-to-moderate COVID-19 in adults: ? with positive results of direct SARS-CoV-2 viral testing, and ? who are at high risk for progression to severe COVID-19 including hospitalization or death, and for whom other COVID-19 treatment options approved or authorized by the FDA are not accessible or clinically appropriate. The FDA has authorized the emergency use  of [place] for the treatment of mild-tomoderate COVID-19 in adults under an EUA. For more information on EUA, see the "What is an Emergency Use Authorization (EUA)?" section at the end of this Fact Sheet. LAGEVRIO is not authorized: ? for use in people less than 56 years of age. ? for prevention of COVID-19. ? for people needing hospitalization for COVID-19. ? for use for longer than 5 consecutive days.  What should I tell my healthcare provider before I take LAGEVRIO? Tell your healthcare provider if you: ? Have any allergies ? Are breastfeeding or plan to  breastfeed ? Have any serious illnesses ? Are taking any medicines (prescription, over-the-counter, vitamins, or herbal products).  How do I take LAGEVRIO? ? Take LAGEVRIO exactly as your healthcare provider tells you to take it. ? Take 4 capsules of LAGEVRIO every 12 hours (for example, at 8 am and at 8 pm) ? Take LAGEVRIO for 5 days. It is important that you complete the full 5 days of treatment with LAGEVRIO. Do not stop taking LAGEVRIO before you complete the full 5 days of treatment, even if you feel better. ? Take LAGEVRIO with or without food. ? You should stay in isolation for as long as your healthcare provider tells you to. Talk to your healthcare provider if you are not sure about how to properly isolate while you have COVID-19. ? Swallow LAGEVRIO capsules whole. Do not open, break, or crush the capsules. If you cannot swallow capsules whole, tell your healthcare provider. ? What to do if you miss a dose: o If it has been less than 10 hours since the missed dose, take it as soon as you remember o If it has been more than 10 hours since the missed dose, skip the missed dose and take your dose at the next scheduled time. ? Do not double the dose of LAGEVRIO to make up for a missed dose.  What are the important possible side effects of LAGEVRIO? ? See, "What is the most important information I should know about LAGEVRIO?" ? Allergic Reactions. Allergic reactions can happen in people taking LAGEVRIO, even after only 1 dose. Stop taking LAGEVRIO and call your healthcare provider right away if you get any of the following symptoms of an allergic reaction: o hives o rapid heartbeat o trouble swallowing or breathing o swelling of the mouth, lips, or face o throat tightness o hoarseness o skin rash The most common side effects of LAGEVRIO are: ? diarrhea ? nausea ? dizziness These are not all the possible side effects of LAGEVRIO. Not many people have taken LAGEVRIO. Serious  and unexpected side effects may happen. This medicine is still being studied, so it is possible that all of the risks are not known at this time.  What other treatment choices are there?  Veklury (remdesivir) is FDA-approved as an intravenous (IV) infusion for the treatment of mildto-moderate SHFWY-63 in certain adults and children. Talk with your doctor to see if Marijean Heath is appropriate for you. Like LAGEVRIO, FDA may also allow for the emergency use of other medicines to treat people with COVID-19. Go to [email or web address] for more information. It is your choice to be treated or not to be treated with LAGEVRIO. Should you decide not to take it, it will not change your standard medical care.  What if I am breastfeeding? Breastfeeding is not recommended during treatment with LAGEVRIO and for 4 days after the last dose of LAGEVRIO. If you are breastfeeding or plan to breastfeed, talk  to your healthcare provider about your options and specific situation before taking LAGEVRIO.  How do I report side effects with LAGEVRIO? Contact your healthcare provider if you have any side effects that bother you or do not go away. Report side effects to FDA MedWatch at SmoothHits.hu or call 1-800-FDA-1088 (1- [phone]).  How should I store [place]? ? Store LAGEVRIO capsules at room temperature between 67F to 74F (20C to 25C). ? Keep LAGEVRIO and all medicines out of the reach of children and pets. How can I learn more about COVID-19? ? Ask your healthcare provider. ? Visit [email or web address] ? Contact your local or state public health department. ? Call [place] at [phone] (toll free in the [place] ? Visit [email or web address]  What Is an Emergency Use Authorization (EUA)? The [place] FDA has made [place] available under an emergency access mechanism called  an Emergency Use Authorization (EUA) The EUA is supported by a Presenter, broadcasting Health and Human Service (HHS) declaration that circumstances exist to justify emergency use of drugs and biological products during the COVID-19 pandemic. LAGEVRIO for the treatment of mild-to-moderate COVID-19 in adults with positive results of direct SARS-CoV-2 viral testing, who are at high risk for progression to severe COVID-19, including hospitalization or death, and for whom alternative COVID-19 treatment options approved or authorized by FDA are not accessible or clinically appropriate, has not undergone the same type of review as an FDA-approved product. In issuing an EUA under the OBSJG-28 public health emergency, the FDA has determined, among other things, that based on the total amount of scientific evidence available including data from adequate and well-controlled clinical trials, if available, it is reasonable to believe that the product may be effective for diagnosing, treating, or preventing COVID-19, or a serious or life-threatening disease or condition caused by COVID-19; that the known and potential benefits of the product, when used to diagnose, treat, or prevent such disease or condition, outweigh the known and potential risks of such product; and that there are no adequate, approved, and available alternatives.  All of these criteria must be met to allow for the product to be used in the treatment of patients during the COVID-19 pandemic. The EUA for LAGEVRIO is in effect for the duration of the COVID-19 declaration justifying emergency use of LAGEVRIO, unless terminated or revoked (after which LAGEVRIO may no longer be used under the EUA). For patent information: http://rogers.info/ Copyright  2021-2022 [employer] [place], [place] and its affiliates. All rights reserved. [id number] Revised: March 2022  Fact Sheet for Patients And Caregivers Emergency Use  Authorization (EUA) Of LAGEVRIOT (molnupiravir) capsules For Coronavirus Disease 2019 (COVID-19)  What is the most important information I should know about LAGEVRIO? LAGEVRIO may cause serious side effects, including: ? LAGEVRIO may cause harm to your unborn baby. It is not known if LAGEVRIO will harm your baby if you take LAGEVRIO during pregnancy. o LAGEVRIO is not recommended for use in pregnancy. o LAGEVRIO has not been studied in pregnancy. LAGEVRIO was studied in pregnant animals only. When LAGEVRIO was given to pregnant animals, LAGEVRIO caused harm to their unborn babies. o You and your healthcare provider may decide that you should take LAGEVRIO during pregnancy if there are no other COVID-19 treatment options approved or authorized by the FDA that are accessible or clinically appropriate for you. o If you and your healthcare provider decide that you should take LAGEVRIO during pregnancy, you and your healthcare provider should discuss the known and potential benefits  and the potential risks of taking LAGEVRIO during pregnancy. For individuals who are able to become pregnant: ? You should use a reliable method of birth control (contraception) consistently and correctly during treatment with LAGEVRIO and for 4 days after the last dose of LAGEVRIO. Talk to your healthcare provider about reliable birth control methods. ? Before starting treatment with [facility] your healthcare provider may do a pregnancy test to see if you are pregnant before starting treatment with LAGEVRIO. ? Tell your healthcare provider right away if you become pregnant or think you may be pregnant during treatment with LAGEVRIO. Pregnancy Surveillance Program: ? There is a pregnancy surveillance program for individuals who take LAGEVRIO during pregnancy. The purpose of this program is to collect information about the health of you and your baby. Talk to your healthcare provider about how to take part in this  program. ? If you take LAGEVRIO during pregnancy and you agree to participate in the pregnancy surveillance program and allow your healthcare provider to share your information with [place], then your healthcare provider will report your use of [place] during pregnancy to [place]. by calling [phone] or [email or web address]. For individuals who are sexually active with partners who are able to become pregnant: ? It is not known if LAGEVRIO can affect sperm. While the risk is regarded as low, animal studies to fully assess the potential for LAGEVRIO to affect the babies of males treated with LAGEVRIO have not been completed. A reliable method of birth control (contraception) should be used consistently and correctly during treatment with LAGEVRIO and for at least 3 months after the last dose. The risk to sperm beyond 3 months is not known. Studies to understand the risk to sperm beyond 3 months are ongoing. Talk to your healthcare provider about reliable birth control methods. Talk to your healthcare provider if you have questions or concerns about how LAGEVRIO may affect sperm. You are being given this fact sheet because your healthcare provider believes it is necessary to provide you with LAGEVRIO for the treatment of adults with mild-to-moderate coronavirus disease 2019 (COVID-19) with positive results of direct SARS-CoV-2 viral testing, and who are at high risk for progression to severe COVID-19 including hospitalization or death, and for whom other COVID-19 treatment options approved or authorized by the FDA are not accessible or clinically appropriate. The U.S. Food and Drug Administration (FDA) has issued an Emergency Use Authorization (EUA) to make LAGEVRIO available during the COVID-19 pandemic (for more details about an EUA please see "What is an Emergency Use Authorization?" at the end of this document). LAGEVRIO is not an FDA-approved  medicine in the [place]. Read this Fact Sheet for information about LAGEVRIO. Talk to your healthcare provider about your options if you have any questions. It is your choice to take LAGEVRIO.  What is COVID-19? COVID-19 is caused by a virus called a coronavirus. You can get COVID-19 through close contact with another person who has the virus. COVID-19 illnesses have ranged from very mild-to-severe, including illness resulting in death. While information so far suggests that most COVID-19 illness is mild, serious illness can happen and may cause some of your other medical conditions to become worse. Older people and people of all ages with severe, long lasting (chronic) medical conditions like heart disease, lung disease and diabetes, for example seem to be at higher risk of being hospitalized for COVID-19.  What is LAGEVRIO? LAGEVRIO is an investigational medicine used to treat mild-to-moderate COVID-19  in adults: ? with positive results of direct SARS-CoV-2 viral testing, and ? who are at high risk for progression to severe COVID-19 including hospitalization or death, and for whom other COVID-19 treatment options approved or authorized by the FDA are not accessible or clinically appropriate. The FDA has authorized the emergency use of LAGEVRIO for the treatment of mild-tomoderate COVID-19 in adults under an EUA. For more information on EUA, see the "What is an Emergency Use Authorization (EUA)?" section at the end of this Fact Sheet. LAGEVRIO is not authorized: ? for use in people less than 7 years of age. ? for prevention of COVID-19. ? for people needing hospitalization for COVID-19. ? for use for longer than 5 consecutive days.  What should I tell my healthcare provider before I take LAGEVRIO? Tell your healthcare provider if you: ? Have any allergies ? Are breastfeeding or plan to breastfeed ? Have any serious illnesses ? Are taking any medicines (prescription,  over-the-counter, vitamins, or herbal products).  How do I take LAGEVRIO? ? Take LAGEVRIO exactly as your healthcare provider tells you to take it. ? Take 4 capsules of LAGEVRIO every 12 hours (for example, at 8 am and at 8 pm) ? Take LAGEVRIO for 5 days. It is important that you complete the full 5 days of treatment with LAGEVRIO. Do not stop taking LAGEVRIO before you complete the full 5 days of treatment, even if you feel better. ? Take LAGEVRIO with or without food. ? You should stay in isolation for as long as your healthcare provider tells you to. Talk to your healthcare provider if you are not sure about how to properly isolate while you have COVID-19. ? Swallow LAGEVRIO capsules whole. Do not open, break, or crush the capsules. If you cannot swallow capsules whole, tell your healthcare provider. ? What to do if you miss a dose: o If it has been less than 10 hours since the missed dose, take it as soon as you remember o If it has been more than 10 hours since the missed dose, skip the missed dose and take your dose at the next scheduled time. ? Do not double the dose of LAGEVRIO to make up for a missed dose.  What are the important possible side effects of LAGEVRIO? ? See, "What is the most important information I should know about LAGEVRIO?" ? Allergic Reactions. Allergic reactions can happen in people taking LAGEVRIO, even after only 1 dose. Stop taking LAGEVRIO and call your healthcare provider right away if you get any of the following symptoms of an allergic reaction: o hives o rapid heartbeat o trouble swallowing or breathing o swelling of the mouth, lips, or face o throat tightness o hoarseness o skin rash The most common side effects of LAGEVRIO are: ? diarrhea ? nausea ? dizziness These are not all the possible side effects of LAGEVRIO. Not many people have taken LAGEVRIO. Serious and unexpected side effects may happen. This medicine is still being studied, so  it is possible that all of the risks are not known at this time.  What other treatment choices are there?  Veklury (remdesivir) is FDA-approved as an intravenous (IV) infusion for the treatment of mildto-moderate PQZRA-07 in certain adults and children. Talk with your doctor to see if Marijean Heath is appropriate for you. Like LAGEVRIO, FDA may also allow for the emergency use of other medicines to treat people with COVID-19. Go to [email or web address] for more information. It is your choice to be treated or not  to be treated with LAGEVRIO. Should you decide not to take it, it will not change your standard medical care.  What if I am breastfeeding? Breastfeeding is not recommended during treatment with LAGEVRIO and for 4 days after the last dose of LAGEVRIO. If you are breastfeeding or plan to breastfeed, talk to your healthcare provider about your options and specific situation before taking LAGEVRIO.  How do I report side effects with LAGEVRIO? Contact your healthcare provider if you have any side effects that bother you or do not go away. Report side effects to FDA MedWatch at SmoothHits.hu or call 1-800-FDA-1088 (1- ([phone]).  How should I store [place]? ? Store LAGEVRIO capsules at room temperature between 88F to 84F (20C to 25C). ? Keep LAGEVRIO and all medicines out of the reach of children and pets. How can I learn more about COVID-19? ? Ask your healthcare provider. ? Visit [email or web address] ? Contact your local or state public health department. ? Call [place] at [phone] (toll free in the [place] ? Visit [email or web address]  What Is an Emergency Use Authorization (EUA)? The [place] FDA has made [place] available under an emergency access mechanism called an Emergency Use Authorization (EUA) The EUA is supported by a Presenter, broadcasting  Health and Human Service (HHS) declaration that circumstances exist to justify emergency use of drugs and biological products during the COVID-19 pandemic. LAGEVRIO for the treatment of mild-to-moderate COVID-19 in adults with positive results of direct SARS-CoV-2 viral testing, who are at high risk for progression to severe COVID-19, including hospitalization or death, and for whom alternative COVID-19 treatment options approved or authorized by FDA are not accessible or clinically appropriate, has not undergone the same type of review as an FDA-approved product. In issuing an EUA under the CHYIF-02 public health emergency, the FDA has determined, among other things, that based on the total amount of scientific evidence available including data from adequate and well-controlled clinical trials, if available, it is reasonable to believe that the product may be effective for diagnosing, treating, or preventing COVID-19, or a serious or life-threatening disease or condition caused by COVID-19; that the known and potential benefits of the product, when used to diagnose, treat, or prevent such disease or condition, outweigh the known and potential risks of such product; and that there are no adequate, approved, and available alternatives.  All of these criteria must be met to allow for the product to be used in the treatment of patients during the COVID-19 pandemic. The EUA for LAGEVRIO is in effect for the duration of the COVID-19 declaration justifying emergency use of LAGEVRIO, unless terminated or revoked (after which LAGEVRIO may no longer be used under the EUA). For patent information: http://rogers.info/ Copyright  2021-2022 [employer] [place], [place] and its affiliates. All rights reserved. [id number] Revised: March 2022

## 2021-03-21 ENCOUNTER — Telehealth: Payer: Self-pay | Admitting: *Deleted

## 2021-03-21 NOTE — Telephone Encounter (Signed)
Patient called has Covid, she is taking the oral covid medication. She called today c/o vaginal irritation, slight vaginal odor, no discharge. If she did not have covid she would schedule office visit. Patient asked if Rx could sent based on symptoms? Please advise

## 2021-03-22 MED ORDER — TINIDAZOLE 500 MG PO TABS: ORAL_TABLET | ORAL | 0 refills | Status: DC

## 2021-03-22 MED ORDER — FLUCONAZOLE 150 MG PO TABS: 150.0000 mg | ORAL_TABLET | Freq: Once | ORAL | 0 refills | Status: AC

## 2021-03-22 NOTE — Telephone Encounter (Signed)
Dr.Lavoie replied "Can send Tinidazole followed by Fluconazole.  No refill. " Patient informed, Rx sent.

## 2021-03-25 ENCOUNTER — Other Ambulatory Visit: Payer: 59

## 2021-04-02 ENCOUNTER — Other Ambulatory Visit: Payer: Self-pay | Admitting: Emergency Medicine

## 2021-04-02 ENCOUNTER — Other Ambulatory Visit: Payer: Self-pay | Admitting: Rheumatology

## 2021-04-02 DIAGNOSIS — F418 Other specified anxiety disorders: Secondary | ICD-10-CM

## 2021-04-05 ENCOUNTER — Telehealth: Payer: Self-pay | Admitting: *Deleted

## 2021-04-05 NOTE — Telephone Encounter (Signed)
Pt called with complaints of menopausal symptoms. Would like to know if her estradiol patch can be increased.  Last annual 07/28/2020 I will route to Provider for recommendations.

## 2021-04-08 ENCOUNTER — Ambulatory Visit
Admission: RE | Admit: 2021-04-08 | Discharge: 2021-04-08 | Disposition: A | Discharge status: Home / Self Care | Payer: 59 | Source: Ambulatory Visit | Attending: Physician Assistant | Admitting: Physician Assistant

## 2021-04-08 DIAGNOSIS — M545 Low back pain, unspecified: Secondary | ICD-10-CM

## 2021-04-08 DIAGNOSIS — M79651 Pain in right thigh: Secondary | ICD-10-CM

## 2021-04-11 ENCOUNTER — Encounter: Payer: Self-pay | Admitting: *Deleted

## 2021-04-11 NOTE — Telephone Encounter (Signed)
Per Dr. Dellis Filbert :She is at the maximum patch dosage.  We can switch her to Estradiol 1 mg PO daily.   I called patient mail box full. My chart message sent.

## 2021-04-28 ENCOUNTER — Ambulatory Visit: Payer: 59 | Admitting: Emergency Medicine

## 2021-06-10 ENCOUNTER — Other Ambulatory Visit: Payer: Self-pay | Admitting: Emergency Medicine

## 2021-06-10 DIAGNOSIS — I10 Essential (primary) hypertension: Secondary | ICD-10-CM

## 2021-06-27 ENCOUNTER — Other Ambulatory Visit: Payer: Self-pay

## 2021-06-27 ENCOUNTER — Ambulatory Visit: Admission: EM | Admit: 2021-06-27 | Discharge: 2021-06-27 | Discharge status: Absent without leave | Payer: 59

## 2021-06-28 ENCOUNTER — Telehealth: Payer: 59 | Admitting: Physician Assistant

## 2021-06-28 ENCOUNTER — Telehealth: Payer: 59 | Admitting: Family Medicine

## 2021-06-28 DIAGNOSIS — J019 Acute sinusitis, unspecified: Secondary | ICD-10-CM

## 2021-06-28 DIAGNOSIS — R0981 Nasal congestion: Secondary | ICD-10-CM

## 2021-06-28 DIAGNOSIS — B9789 Other viral agents as the cause of diseases classified elsewhere: Secondary | ICD-10-CM

## 2021-06-28 MED ORDER — FLUTICASONE PROPIONATE 50 MCG/ACT NA SUSP: 2.0000 | Freq: Every day | NASAL | 0 refills | Status: DC

## 2021-06-28 NOTE — Progress Notes (Signed)
[place]-- Needs to take covid test and make appt with results to get treatment.  Will make new appt now.

## 2021-06-28 NOTE — Progress Notes (Signed)
E-Visit for Sinus Problems  We are sorry that you are not feeling well.  Here is how we plan to help!  Based on what you have shared with me it looks like you have sinusitis.  Sinusitis is inflammation and infection in the sinus cavities of the head.  Based on your presentation I believe you most likely have Acute Viral Sinusitis.This is an infection most likely caused by a virus. There is not specific treatment for viral sinusitis other than to help you with the symptoms until the infection runs its course.  You may use an oral decongestant such as Mucinex D or if you have glaucoma or high blood pressure use plain Mucinex. Saline nasal spray help and can safely be used as often as needed for congestion, I have prescribed: Fluticasone nasal spray two sprays in each nostril once a day  Some authorities believe that zinc sprays or the use of Echinacea may shorten the course of your symptoms.  Sinus infections are not as easily transmitted as other respiratory infection, however we still recommend that you avoid close contact with loved ones, especially the very young and elderly.  Remember to wash your hands thoroughly throughout the day as this is the number one way to prevent the spread of infection!  Home Care: Only take medications as instructed by your medical team. Do not take these medications with alcohol. A steam or ultrasonic humidifier can help congestion.  You can place a towel over your head and breathe in the steam from hot water coming from a faucet. Avoid close contacts especially the very young and the elderly. Cover your mouth when you cough or sneeze. Always remember to wash your hands.  Get Help Right Away If: You develop worsening fever or sinus pain. You develop a severe head ache or visual changes. Your symptoms persist after you have completed your treatment plan.  Make sure you Understand these instructions. Will watch your condition. Will get help right away if you  are not doing well or get worse.   Thank you for choosing an e-visit.  Your e-visit answers were reviewed by a board certified advanced clinical practitioner to complete your personal care plan. Depending upon the condition, your plan could have included both over the counter or prescription medications.  Please review your pharmacy choice. Make sure the pharmacy is open so you can pick up prescription now. If there is a problem, you may contact your provider through [employer] and have the prescription routed to another pharmacy.  Your safety is important to [place]. If you have drug allergies check your prescription carefully.   For the next 24 hours you can use MyChart to ask questions about today's visit, request a non-urgent call back, or ask for a work or school excuse. You will get an email in the next two days asking about your experience. I hope that your e-visit has been valuable and will speed your recovery.

## 2021-06-28 NOTE — Progress Notes (Signed)
I have spent 5 minutes in review of e-visit questionnaire, review and updating patient chart, medical decision making and response to patient.   Deundra Furber Cody Maeven Mcdougall, PA-C    

## 2021-10-24 ENCOUNTER — Emergency Department (HOSPITAL_BASED_OUTPATIENT_CLINIC_OR_DEPARTMENT_OTHER): Payer: Self-pay

## 2021-10-24 ENCOUNTER — Inpatient Hospital Stay (HOSPITAL_BASED_OUTPATIENT_CLINIC_OR_DEPARTMENT_OTHER)
Admission: EM | Admit: 2021-10-24 | Discharge: 2021-10-26 | DRG: 418 | Disposition: A | Discharge status: Home / Self Care | Payer: Self-pay | Attending: Surgery | Admitting: Surgery

## 2021-10-24 ENCOUNTER — Other Ambulatory Visit: Payer: Self-pay

## 2021-10-24 ENCOUNTER — Encounter (HOSPITAL_BASED_OUTPATIENT_CLINIC_OR_DEPARTMENT_OTHER): Payer: Self-pay

## 2021-10-24 DIAGNOSIS — Z7984 Long term (current) use of oral hypoglycemic drugs: Secondary | ICD-10-CM

## 2021-10-24 DIAGNOSIS — K810 Acute cholecystitis: Secondary | ICD-10-CM

## 2021-10-24 DIAGNOSIS — Z635 Disruption of family by separation and divorce: Secondary | ICD-10-CM

## 2021-10-24 DIAGNOSIS — Z9989 Dependence on other enabling machines and devices: Secondary | ICD-10-CM

## 2021-10-24 DIAGNOSIS — L4050 Arthropathic psoriasis, unspecified: Secondary | ICD-10-CM | POA: Diagnosis present

## 2021-10-24 DIAGNOSIS — Z79890 Hormone replacement therapy (postmenopausal): Secondary | ICD-10-CM

## 2021-10-24 DIAGNOSIS — F419 Anxiety disorder, unspecified: Secondary | ICD-10-CM | POA: Diagnosis present

## 2021-10-24 DIAGNOSIS — R7303 Prediabetes: Secondary | ICD-10-CM | POA: Diagnosis present

## 2021-10-24 DIAGNOSIS — E785 Hyperlipidemia, unspecified: Secondary | ICD-10-CM | POA: Diagnosis present

## 2021-10-24 DIAGNOSIS — K8000 Calculus of gallbladder with acute cholecystitis without obstruction: Secondary | ICD-10-CM

## 2021-10-24 DIAGNOSIS — Z8249 Family history of ischemic heart disease and other diseases of the circulatory system: Secondary | ICD-10-CM

## 2021-10-24 DIAGNOSIS — M159 Polyosteoarthritis, unspecified: Secondary | ICD-10-CM | POA: Diagnosis present

## 2021-10-24 DIAGNOSIS — K7581 Nonalcoholic steatohepatitis (NASH): Secondary | ICD-10-CM | POA: Diagnosis present

## 2021-10-24 DIAGNOSIS — Z9851 Tubal ligation status: Secondary | ICD-10-CM

## 2021-10-24 DIAGNOSIS — E559 Vitamin D deficiency, unspecified: Secondary | ICD-10-CM | POA: Diagnosis present

## 2021-10-24 DIAGNOSIS — Z833 Family history of diabetes mellitus: Secondary | ICD-10-CM

## 2021-10-24 DIAGNOSIS — Z90710 Acquired absence of both cervix and uterus: Secondary | ICD-10-CM

## 2021-10-24 DIAGNOSIS — G4733 Obstructive sleep apnea (adult) (pediatric): Secondary | ICD-10-CM

## 2021-10-24 DIAGNOSIS — F411 Generalized anxiety disorder: Secondary | ICD-10-CM | POA: Diagnosis present

## 2021-10-24 DIAGNOSIS — K8013 Calculus of gallbladder with acute and chronic cholecystitis with obstruction: Principal | ICD-10-CM | POA: Diagnosis present

## 2021-10-24 DIAGNOSIS — Z6841 Body Mass Index (BMI) 40.0 and over, adult: Secondary | ICD-10-CM

## 2021-10-24 DIAGNOSIS — I10 Essential (primary) hypertension: Secondary | ICD-10-CM | POA: Diagnosis present

## 2021-10-24 DIAGNOSIS — K219 Gastro-esophageal reflux disease without esophagitis: Secondary | ICD-10-CM | POA: Diagnosis present

## 2021-10-24 DIAGNOSIS — F32A Depression, unspecified: Secondary | ICD-10-CM | POA: Diagnosis present

## 2021-10-24 DIAGNOSIS — L409 Psoriasis, unspecified: Secondary | ICD-10-CM | POA: Diagnosis present

## 2021-10-24 DIAGNOSIS — F17210 Nicotine dependence, cigarettes, uncomplicated: Secondary | ICD-10-CM | POA: Diagnosis present

## 2021-10-24 DIAGNOSIS — Z79899 Other long term (current) drug therapy: Secondary | ICD-10-CM

## 2021-10-24 HISTORY — DX: Acute cholecystitis: K81.0

## 2021-10-24 LAB — COMPREHENSIVE METABOLIC PANEL
ALT: 65 U/L — ABNORMAL HIGH (ref 0–44)
AST: 37 U/L (ref 15–41)
Albumin: 4.9 g/dL (ref 3.5–5.0)
Alkaline Phosphatase: 74 U/L (ref 38–126)
Anion gap: 12 (ref 5–15)
BUN: 20 mg/dL (ref 6–20)
CO2: 24 mmol/L (ref 22–32)
Calcium: 10.2 mg/dL (ref 8.9–10.3)
Chloride: 98 mmol/L (ref 98–111)
Creatinine, Ser: 0.68 mg/dL (ref 0.44–1.00)
GFR, Estimated: >60 mL/min (ref >60)
Glucose, Bld: 94 mg/dL (ref 70–99)
Potassium: 3.7 mmol/L (ref 3.5–5.1)
Sodium: 134 mmol/L — ABNORMAL LOW (ref 135–145)
Total Bilirubin: 0.7 mg/dL (ref 0.3–1.2)
Total Protein: 8 g/dL (ref 6.5–8.1)

## 2021-10-24 LAB — CBC
HCT: 45 % (ref 36.0–46.0)
Hemoglobin: 15.1 g/dL — ABNORMAL HIGH (ref 12.0–15.0)
MCH: 29.9 pg (ref 26.0–34.0)
MCHC: 33.6 g/dL (ref 30.0–36.0)
MCV: 89.1 fL (ref 80.0–100.0)
Platelets: 182 10*3/uL (ref 150–400)
RBC: 5.05 MIL/uL (ref 3.87–5.11)
RDW: 13.6 % (ref 11.5–15.5)
WBC: 16.2 10*3/uL — ABNORMAL HIGH (ref 4.0–10.5)
nRBC: 0 % (ref 0.0–0.2)

## 2021-10-24 LAB — BASIC METABOLIC PANEL
Anion gap: 14 (ref 5–15)
BUN: 23 mg/dL — ABNORMAL HIGH (ref 6–20)
CO2: 20 mmol/L — ABNORMAL LOW (ref 22–32)
Calcium: 10.1 mg/dL (ref 8.9–10.3)
Chloride: 98 mmol/L (ref 98–111)
Creatinine, Ser: 0.76 mg/dL (ref 0.44–1.00)
GFR, Estimated: >60 mL/min (ref >60)
Glucose, Bld: 104 mg/dL — ABNORMAL HIGH (ref 70–99)
Potassium: 3.5 mmol/L (ref 3.5–5.1)
Sodium: 132 mmol/L — ABNORMAL LOW (ref 135–145)

## 2021-10-24 LAB — TROPONIN I (HIGH SENSITIVITY)
Troponin I (High Sensitivity): 3 ng/L (ref <18)
Troponin I (High Sensitivity): 3 ng/L (ref <18)

## 2021-10-24 LAB — LIPASE, BLOOD: Lipase: 16 U/L (ref 11–51)

## 2021-10-24 MED ORDER — ONDANSETRON HCL 4 MG/2ML IJ SOLN
4.0000 mg | Freq: Once | INTRAMUSCULAR | Status: AC
  Administered 2021-10-24: 4 mg via INTRAVENOUS
  Filled 2021-10-24: qty 2

## 2021-10-24 MED ORDER — FAMOTIDINE 20 MG PO TABS
40.0000 mg | ORAL_TABLET | Freq: Two times a day (BID) | ORAL | Status: DC
  Administered 2021-10-24 – 2021-10-26 (×3): 40 mg via ORAL
  Filled 2021-10-24 (×3): qty 2

## 2021-10-24 MED ORDER — MORPHINE SULFATE (PF) 4 MG/ML IV SOLN
4.0000 mg | Freq: Once | INTRAVENOUS | Status: AC
  Administered 2021-10-24: 4 mg via INTRAVENOUS
  Filled 2021-10-24: qty 1

## 2021-10-24 MED ORDER — ALPRAZOLAM 0.5 MG PO TABS: 0.5000 mg | ORAL_TABLET | Freq: Every evening | ORAL | Status: DC | PRN

## 2021-10-24 MED ORDER — ONDANSETRON 4 MG PO TBDP
4.0000 mg | ORAL_TABLET | Freq: Once | ORAL | Status: AC
  Administered 2021-10-24: 4 mg via ORAL
  Filled 2021-10-24: qty 1

## 2021-10-24 MED ORDER — ONDANSETRON HCL 4 MG PO TABS
4.0000 mg | ORAL_TABLET | Freq: Four times a day (QID) | ORAL | Status: DC | PRN
Start: 2021-10-24 — End: 2021-10-26

## 2021-10-24 MED ORDER — SODIUM CHLORIDE 0.9 % IV BOLUS
1000.0000 mL | Freq: Once | INTRAVENOUS | Status: AC
Start: 2021-10-24 — End: 2021-10-24
  Administered 2021-10-24: 1000 mL via INTRAVENOUS

## 2021-10-24 MED ORDER — ESCITALOPRAM OXALATE 20 MG PO TABS
20.0000 mg | ORAL_TABLET | Freq: Every day | ORAL | Status: DC
  Administered 2021-10-26: 20 mg via ORAL
  Filled 2021-10-24: qty 1

## 2021-10-24 MED ORDER — FENTANYL CITRATE PF 50 MCG/ML IJ SOSY
50.0000 ug | PREFILLED_SYRINGE | Freq: Once | INTRAMUSCULAR | Status: AC
  Administered 2021-10-24: 50 ug via INTRAVENOUS
  Filled 2021-10-24: qty 1

## 2021-10-24 MED ORDER — ACETAMINOPHEN 325 MG PO TABS
650.0000 mg | ORAL_TABLET | Freq: Four times a day (QID) | ORAL | Status: DC | PRN
  Administered 2021-10-26: 650 mg via ORAL
  Filled 2021-10-24: qty 2

## 2021-10-24 MED ORDER — ACETAMINOPHEN 650 MG RE SUPP: 650.0000 mg | Freq: Four times a day (QID) | RECTAL | Status: DC | PRN

## 2021-10-24 MED ORDER — NICOTINE 14 MG/24HR TD PT24
14.0000 mg | MEDICATED_PATCH | Freq: Every day | TRANSDERMAL | Status: DC | PRN
  Administered 2021-10-24: 14 mg via TRANSDERMAL
  Filled 2021-10-24: qty 1

## 2021-10-24 MED ORDER — METRONIDAZOLE 500 MG/100ML IV SOLN
500.0000 mg | Freq: Once | INTRAVENOUS | Status: AC
  Administered 2021-10-24: 500 mg via INTRAVENOUS
  Filled 2021-10-24: qty 100

## 2021-10-24 MED ORDER — LACTATED RINGERS IV SOLN: INTRAVENOUS | Status: AC

## 2021-10-24 MED ORDER — MORPHINE SULFATE (PF) 4 MG/ML IV SOLN
4.0000 mg | INTRAVENOUS | Status: AC | PRN
  Administered 2021-10-24 – 2021-10-25 (×4): 4 mg via INTRAVENOUS
  Filled 2021-10-24 (×4): qty 1

## 2021-10-24 MED ORDER — METRONIDAZOLE 500 MG/100ML IV SOLN
500.0000 mg | Freq: Two times a day (BID) | INTRAVENOUS | Status: DC
  Administered 2021-10-25 (×3): 500 mg via INTRAVENOUS
  Filled 2021-10-24 (×4): qty 100

## 2021-10-24 MED ORDER — SODIUM CHLORIDE 0.9 % IV SOLN
2.0000 g | INTRAVENOUS | Status: DC
  Administered 2021-10-25 (×2): 2 g via INTRAVENOUS
  Filled 2021-10-24 (×2): qty 20

## 2021-10-24 MED ORDER — ONDANSETRON HCL 4 MG/2ML IJ SOLN
4.0000 mg | Freq: Four times a day (QID) | INTRAMUSCULAR | Status: DC | PRN
  Administered 2021-10-24 – 2021-10-25 (×3): 4 mg via INTRAVENOUS
  Filled 2021-10-24 (×3): qty 2

## 2021-10-24 MED ORDER — IRBESARTAN 150 MG PO TABS
150.0000 mg | ORAL_TABLET | Freq: Every day | ORAL | Status: DC
  Administered 2021-10-26: 150 mg via ORAL
  Filled 2021-10-24: qty 1

## 2021-10-24 MED ORDER — SODIUM CHLORIDE 0.9 % IV SOLN
2.0000 g | Freq: Once | INTRAVENOUS | Status: AC
  Administered 2021-10-24: 2 g via INTRAVENOUS
  Filled 2021-10-24: qty 20

## 2021-10-24 NOTE — Subjective & Objective (Signed)
CC: abd pain ?HPI: ?41 year old female history of prediabetes, hypertension, psoriatic arthritis, depression, anxiety, presents to the ER with sudden onset of abdominal pain started after she started eating dinner.  Patient states that she has been feeling unwell for last 3 days has had intermittent right-sided back pain.  No nausea at that time.  She attributed her back pain to her chronic aches and pains from her psoriatic arthritis.  No fever.  No chills.  No diarrhea. ? ?Onset of abdominal pain was sudden.  She presented to the ER after she started having lower lower chest pain. ? ?On arrival to the ER, temp 97.4 heart rate 99 blood pressure 117/71 ? ?Labs showed a white count of 16.2, hemoglobin 15, platelets of 182 ? ?Lipase is 16 ?Sodium 132, potassium 3.5, BUN 23, creatinine 0.7, glucose of 104 ? ?Abdominal ultrasound demonstrated 19 mm calculus seen impacted in the gallbladder neck.  There is a small amount of layering sludge in the gallbladder.  Gallbladder was mildly distended.  Positive sonographic Murphy sign.  There was no pericholecystic fluid. ? ?EDP discussed case with general surgery and [facility] hospitalist. ? ?Patient transferred to [facility]. ? ?Dr. Marlou Starks with general surgery was notified of patient's arrival. ?

## 2021-10-24 NOTE — Assessment & Plan Note (Signed)
Chronic. 

## 2021-10-24 NOTE — ED Notes (Signed)
Report given to [facility]. 

## 2021-10-24 NOTE — ED Triage Notes (Signed)
Pt to ED c/o sudden onset chest pain that occurred when at work. Reports pain radiates to back. Reports feeling weak and nauseas over the past 2 days. Currently reports chest "tightness"  ?

## 2021-10-24 NOTE — Assessment & Plan Note (Signed)
On pepcid. ?

## 2021-10-24 NOTE — Assessment & Plan Note (Addendum)
Admit to [facility] observation. Continue with IV Rocephin/flagyl. I have informed Dr. Marlou Starks of pt's arrival to [facility] and need for surgical consultation in the AM. No need for urgent surgical intervention in my opinion. Have asked Dr. Marlou Starks to have pt seen in the AM. Keep NPO. On IVF. He requested that chemical DVT prophylaxis be held until pt is seen in the AM. SCD ordered. IV morphine 4 mg q2h prn for pain. ?

## 2021-10-24 NOTE — H&P (Signed)
?History and Physical  ? ? ?Theresa Sawyer GOT:[id number] DOB: 09-29-1980 DOA: 10/24/2021 ? ?DOS: the patient was seen and examined on 10/24/2021 ? ?PCP: Horald Pollen, MD  ? ?Patient coming from: Home ? ?I have personally briefly reviewed patient's old medical records in [place] ? ?CC: abd pain ?HPI: ?41 year old female history of prediabetes, hypertension, psoriatic arthritis, depression, anxiety, presents to the ER with sudden onset of abdominal pain started after she started eating dinner.  Patient states that she has been feeling unwell for last 3 days has had intermittent right-sided back pain.  No nausea at that time.  She attributed her back pain to her chronic aches and pains from her psoriatic arthritis.  No fever.  No chills.  No diarrhea. ? ?Onset of abdominal pain was sudden.  She presented to the ER after she started having lower lower chest pain. ? ?On arrival to the ER, temp 97.4 heart rate 99 blood pressure 117/71 ? ?Labs showed a white count of 16.2, hemoglobin 15, platelets of 182 ? ?Lipase is 16 ?Sodium 132, potassium 3.5, BUN 23, creatinine 0.7, glucose of 104 ? ?Abdominal ultrasound demonstrated 19 mm calculus seen impacted in the gallbladder neck.  There is a small amount of layering sludge in the gallbladder.  Gallbladder was mildly distended.  Positive sonographic Murphy sign.  There was no pericholecystic fluid. ? ?EDP discussed case with general surgery and [facility] hospitalist. ? ?Patient transferred to [facility]. ? ?Dr. Marlou Starks with general surgery was notified of patient's arrival.  ? ?ED Course: Abdominal ultrasound demonstrated impacted gallbladder in the gallbladder neck. ? ?Review of Systems:  ?Review of Systems  ?Constitutional:   ?     Has felt unwell for 2-3 days.  ?HENT: Negative.    ?Eyes: Negative.   ?Respiratory: Negative.    ?Cardiovascular:  Positive for chest pain.  ?Gastrointestinal:  Positive for abdominal pain and nausea.  ?Genitourinary: Negative.    ?Musculoskeletal:  Positive for back pain.  ?Skin: Negative.   ?Neurological: Negative.   ?Endo/Heme/Allergies: Negative.   ?Psychiatric/Behavioral: Negative.    ?All other systems reviewed and are negative. ? ?Past Medical History:  ?Diagnosis Date  ? Abnormal uterine bleeding (AUB)   ? Anxiety   ? Arthritis   ? oa  ? Depression   ? GERD (gastroesophageal reflux disease)   ? History of chest pain (10-20-2019  pt denies any cardiac s&s)  ? mutliple ED visits, atypical chest pain, chest wall pain, muscularskeletal chest pain;  pt had cardiology evaulation dated 04-23-2018 note in epic , state atypical chest wall pain with negative d dimer and troponin's multiple times in epic, suggested CTA  (pt did not get done)  ? Hyperlipidemia   ? Hypertension   ? IDA (iron deficiency anemia)   ? none recently  ? Menorrhagia   ? Migraine   ? OSA on CPAP   ? followed by dr ather cpap set on 7 to 14  ? Psoriasis   ? Urinary frequency   ? ? ?Past Surgical History:  ?Procedure Laterality Date  ? DILATATION & CURETTAGE/HYSTEROSCOPY WITH MYOSURE N/A 10/24/2019  ? Procedure: DILATATION & CURETTAGE/HYSTEROSCOPY WITH MYOSURE;  Surgeon: Joseph Pierini, MD;  Location[facility];  Service: Gynecology;  Laterality: N/A;  ? HYSTEROSCOPY WITH NOVASURE N/A 04/06/2020  ? Procedure: DILATION AND CURRETTAGE; NOVASURE ABLATION;  Surgeon: Joseph Pierini, MD;  Location: [facility];  Service: Gynecology;  Laterality: N/A;  ? LAPAROSCOPY N/A 04/06/2020  ? Procedure:  LAPAROSCOPY OPERATIVE; LYSIS OF ADHESION;  Surgeon: Joseph Pierini, MD;  Location: [facility];  Service: Gynecology;  Laterality: N/A;  ? ROBOTIC ASSISTED TOTAL HYSTERECTOMY WITH BILATERAL SALPINGO OOPHERECTOMY Bilateral 09/28/2020  ? Procedure: XI ROBOTIC ASSISTED TOTAL LAPAROSCOPIC HYSTERECTOMY WITH BILATERAL SALPINGO OOPHORECTOMY;  Surgeon: Princess Bruins, MD;  Location: [facility];  Service: Gynecology;  Laterality:  Bilateral;  request 8:30am OR start time in [place] held time for Dr. Dellis Filbert ?requests 2 hours ?KATHY CONFIRMED ON 2/10 W/NICK FOR  TRACY (RNFA) TO ASSIST  ? TUBAL LIGATION Bilateral 02/05/2013  ? Procedure: POST PARTUM TUBAL LIGATION;  Surgeon: Woodroe Mode, MD;  Location: [facility];  Service: Gynecology;  Laterality: Bilateral;    Filshie clips  ? ? ? reports that she has been smoking cigarettes. She has a 10.00 pack-year smoking history. She has never used smokeless tobacco. She reports that she does not drink alcohol and does not use drugs. ? ?No Known Allergies ? ?Family History  ?Problem Relation Age of Onset  ? Diabetes Maternal Grandmother   ? Diabetes Paternal Grandmother   ? Anxiety disorder Mother   ? Psoriasis Sister   ? Heart attack Brother 7  ?     deceased from this  ? Anxiety disorder Brother   ? Cirrhosis Maternal Grandfather   ?     alcohol related  ? Allergy (severe) Son   ? Healthy Son   ? Colon cancer Neg Hx   ? Esophageal cancer Neg Hx   ? Colon polyps Neg Hx   ? Stomach cancer Neg Hx   ? Pancreatic cancer Neg Hx   ? ? ?Prior to Admission medications   ?Medication Sig Start Date End Date Taking? Authorizing Provider  ?ALPRAZolam (XANAX) 0.5 MG tablet TAKE 1 TABLET BY MOUTH EVERY DAY AS NEEDED FOR ANXIETY 04/04/21   Horald Pollen, MD  ?benzonatate (TESSALON PERLES) 100 MG capsule Take 1 capsule (100 mg total) by mouth 3 (three) times daily as needed. 03/17/21   Lucretia Kern, DO  ?escitalopram (LEXAPRO) 20 MG tablet TAKE 1 TABLET(20 MG) BY MOUTH DAILY 02/09/21   Horald Pollen, MD  ?estradiol (VIVELLE-DOT) 0.1 MG/24HR patch Place 1 patch (0.1 mg total) onto the skin 2 (two) times a week. 01/17/21   Princess Bruins, MD  ?famotidine (PEPCID) 40 MG tablet TAKE 1 TABLET(40 MG) BY MOUTH TWICE DAILY 05/18/20   Thornton Park, MD  ?fluticasone (FLONASE) 50 MCG/ACT nasal spray Place 2 sprays into both nostrils daily. 06/28/21   Brunetta Jeans, PA-C  ?hydrochlorothiazide (HYDRODIURIL) 25  MG tablet Take 1 tablet (25 mg total) by mouth daily. 07/20/20   Just, Laurita Quint, FNP  ?metFORMIN (GLUCOPHAGE) 500 MG tablet Take 1 tablet (500 mg total) by mouth 2 (two) times daily with a meal. 10/30/20   Sagardia, Ines Bloomer, MD  ?rosuvastatin (CRESTOR) 10 MG tablet Take 1 tablet (10 mg total) by mouth daily. 10/30/20   Horald Pollen, MD  ?tinidazole (TINDAMAX) 500 MG tablet Take 2 tablets by mouth daily for 2 days. 03/22/21   Princess Bruins, MD  ?triamcinolone cream (KENALOG) 0.1 % Apply 1 application topically as needed.     [provider]  ?valsartan (DIOVAN) 80 MG tablet TAKE 1 AND 1/2 TABLETS(120 MG) BY MOUTH DAILY 06/10/21   Horald Pollen, MD  ?Vitamin D, Ergocalciferol, (DRISDOL) 1.25 MG (50000 UNIT) CAPS capsule TAKE 1 CAPSULE BY MOUTH EVERY 7 DAYS 06/10/21   Horald Pollen, MD  ? ? ?  Physical Exam: ?Vitals:  ? 10/24/21 2100 10/24/21 2130 10/24/21 2132 10/24/21 2215  ?BP: (!) 128/51 115/71  107/65  ?Pulse: 84 73  79  ?Resp: 10 14  17   ?Temp:   97.8 ?F (36.6 ?C) 98.1 ?F (36.7 ?C)  ?TempSrc:   Oral Oral  ?SpO2: 100% 100%  96%  ?Weight:      ?Height:      ? ? ?Physical Exam ?Vitals and nursing note reviewed.  ?Constitutional:   ?   General: She is not in acute distress. ?   Appearance: Normal appearance. She is obese. She is not ill-appearing, toxic-appearing or diaphoretic.  ?HENT:  ?   Head: Normocephalic and atraumatic.  ?   Nose: Nose normal. No rhinorrhea.  ?Eyes:  ?   General: No scleral icterus. ?Cardiovascular:  ?   Rate and Rhythm: Normal rate and regular rhythm.  ?   Pulses: Normal pulses.  ?Pulmonary:  ?   Effort: Pulmonary effort is normal. No respiratory distress.  ?   Breath sounds: Normal breath sounds. No wheezing or rales.  ?Abdominal:  ?   General: Bowel sounds are normal.  ?   Palpations: Abdomen is soft.  ?   Tenderness: There is abdominal tenderness in the right upper quadrant. There is no guarding or rebound.  ?Musculoskeletal:  ?   Right lower leg: No  edema.  ?   Left lower leg: No edema.  ?Skin: ?   General: Skin is warm and dry.  ?   Capillary Refill: Capillary refill takes less than 2 seconds.  ?   Comments: Plaque psoriasis noted on extensor surfaces.  ?Neurol

## 2021-10-24 NOTE — Assessment & Plan Note (Signed)
Continue ARB. Hold HCTZ. ?

## 2021-10-24 NOTE — ED Provider Notes (Signed)
?[facility] EMERGENCY DEPT ?Provider Note ? ? ?CSN: [id number] ?Arrival date & time: 10/24/21  1524 ? ?  ? ?History ? ?Chief Complaint  ?Patient presents with  ? Chest Pain  ? ? ?Theresa Sawyer is a 41 y.o. female. ? ?Pt is a 41 yo female presenting for chest pain. States chest pain is generalized in nature, started started at 3PM, constant, described as tight, and radiated to upper back. Admits to diaphoresis and nausea. Denies any abdominal pain or diarrhea. Denies sob.  ? ?Prediabetic, HTN, hyperlipidemia, current smoker.  ? ?The history is provided by the patient. No language interpreter was used.  ?Chest Pain ?Associated symptoms: no abdominal pain, no back pain, no cough, no fever, no palpitations, no shortness of breath and no vomiting   ? ?  ? ?Home Medications ?Prior to Admission medications   ?Medication Sig Start Date End Date Taking? Authorizing Provider  ?ALPRAZolam (XANAX) 0.5 MG tablet TAKE 1 TABLET BY MOUTH EVERY DAY AS NEEDED FOR ANXIETY 04/04/21   Horald Pollen, MD  ?benzonatate (TESSALON PERLES) 100 MG capsule Take 1 capsule (100 mg total) by mouth 3 (three) times daily as needed. 03/17/21   Lucretia Kern, DO  ?escitalopram (LEXAPRO) 20 MG tablet TAKE 1 TABLET(20 MG) BY MOUTH DAILY 02/09/21   Horald Pollen, MD  ?estradiol (VIVELLE-DOT) 0.1 MG/24HR patch Place 1 patch (0.1 mg total) onto the skin 2 (two) times a week. 01/17/21   Princess Bruins, MD  ?famotidine (PEPCID) 40 MG tablet TAKE 1 TABLET(40 MG) BY MOUTH TWICE DAILY 05/18/20   Thornton Park, MD  ?fluticasone (FLONASE) 50 MCG/ACT nasal spray Place 2 sprays into both nostrils daily. 06/28/21   Brunetta Jeans, PA-C  ?hydrochlorothiazide (HYDRODIURIL) 25 MG tablet Take 1 tablet (25 mg total) by mouth daily. 07/20/20   Just, Laurita Quint, FNP  ?metFORMIN (GLUCOPHAGE) 500 MG tablet Take 1 tablet (500 mg total) by mouth 2 (two) times daily with a meal. 10/30/20   Sagardia, Ines Bloomer, MD  ?rosuvastatin (CRESTOR) 10 MG  tablet Take 1 tablet (10 mg total) by mouth daily. 10/30/20   Horald Pollen, MD  ?tinidazole (TINDAMAX) 500 MG tablet Take 2 tablets by mouth daily for 2 days. 03/22/21   Princess Bruins, MD  ?triamcinolone cream (KENALOG) 0.1 % Apply 1 application topically as needed.     [provider]  ?valsartan (DIOVAN) 80 MG tablet TAKE 1 AND 1/2 TABLETS(120 MG) BY MOUTH DAILY 06/10/21   Horald Pollen, MD  ?Vitamin D, Ergocalciferol, (DRISDOL) 1.25 MG (50000 UNIT) CAPS capsule TAKE 1 CAPSULE BY MOUTH EVERY 7 DAYS 06/10/21   Horald Pollen, MD  ?   ? ?Allergies    ?Nsaids   ? ?Review of Systems   ?Review of Systems  ?Constitutional:  Negative for chills and fever.  ?HENT:  Negative for ear pain and sore throat.   ?Eyes:  Negative for pain and visual disturbance.  ?Respiratory:  Negative for cough and shortness of breath.   ?Cardiovascular:  Positive for chest pain. Negative for palpitations.  ?Gastrointestinal:  Negative for abdominal pain and vomiting.  ?Genitourinary:  Negative for dysuria and hematuria.  ?Musculoskeletal:  Negative for arthralgias and back pain.  ?Skin:  Negative for color change and rash.  ?Neurological:  Negative for seizures and syncope.  ?All other systems reviewed and are negative. ? ?Physical Exam ?Updated Vital Signs ?BP (!) 109/50 (BP Location: Right Arm)   Pulse 77   Temp 97.8 ?F (36.6 ?  C) (Oral)   Resp 15   Ht 5' 5"  (1.651 m)   Wt 120.2 kg   LMP 09/01/2020   SpO2 97%   BMI 44.10 kg/m?  ?Physical Exam ?Vitals and nursing note reviewed.  ?Constitutional:   ?   General: She is not in acute distress. ?   Appearance: She is well-developed.  ?HENT:  ?   Head: Normocephalic and atraumatic.  ?Eyes:  ?   Conjunctiva/sclera: Conjunctivae normal.  ?Cardiovascular:  ?   Rate and Rhythm: Normal rate and regular rhythm.  ?   Heart sounds: No murmur heard. ?Pulmonary:  ?   Effort: Pulmonary effort is normal. No respiratory distress.  ?   Breath sounds: Normal breath sounds.   ?Abdominal:  ?   Palpations: Abdomen is soft.  ?   Tenderness: There is no abdominal tenderness.  ?Musculoskeletal:     ?   General: No swelling.  ?   Cervical back: Neck supple.  ?Skin: ?   General: Skin is warm and dry.  ?   Capillary Refill: Capillary refill takes less than 2 seconds.  ?Neurological:  ?   Mental Status: She is alert.  ?Psychiatric:     ?   Mood and Affect: Mood normal.  ? ? ?ED Results / Procedures / Treatments   ?Labs ?(all labs ordered are listed, but only abnormal results are displayed) ?Labs Reviewed  ?BASIC METABOLIC PANEL - Abnormal; Notable for the following components:  ?    Result Value  ? Sodium 132 (*)   ? CO2 20 (*)   ? Glucose, Bld 104 (*)   ? BUN 23 (*)   ? All other components within normal limits  ?CBC - Abnormal; Notable for the following components:  ? WBC 16.2 (*)   ? Hemoglobin 15.1 (*)   ? All other components within normal limits  ?COMPREHENSIVE METABOLIC PANEL - Abnormal; Notable for the following components:  ? Sodium 134 (*)   ? ALT 65 (*)   ? All other components within normal limits  ?LIPASE, BLOOD  ?TROPONIN I (HIGH SENSITIVITY)  ?TROPONIN I (HIGH SENSITIVITY)  ? ? ?EKG ?None ? ?Radiology ?DG Chest Portable 1 View ? ?Result Date: 10/24/2021 ?CLINICAL DATA:  Chest pain. EXAM: PORTABLE CHEST 1 VIEW COMPARISON:  Chest x-ray 08/05/2019. FINDINGS: No consolidation. No visible pleural effusions or pneumothorax. Cardiomediastinal silhouette is within normal limits and similar to prior. IMPRESSION: No evidence of acute cardiopulmonary disease. Electronically Signed   By: Margaretha Sheffield M.D.   On: 10/24/2021 16:35  ? ?US Abdomen Limited RUQ (LIVER/GB) ? ?Result Date: 10/24/2021 ?CLINICAL DATA:  Right upper quadrant abdominal pain, nausea EXAM: ULTRASOUND ABDOMEN LIMITED RIGHT UPPER QUADRANT COMPARISON:  None. FINDINGS: Gallbladder: A 19 mm calculus is seen impacted within the gallbladder neck. There is a a small amount of additional layering sludge within the gallbladder. The  gallbladder is mildly distended. No pericholecystic fluid or gallbladder wall thickening is identified. The sonographic Percell Miller sign is reportedly POSITIVE. Common bile duct: Diameter: 1-2 mm in proximal diameter Liver: Mild hepatomegaly. Hepatic parenchymal echogenicity is diffusely increased and there is poor acoustic through transmission in keeping with moderate hepatic steatosis. No focal intrahepatic masses are seen and there is no intrahepatic biliary ductal dilation. Portal vein is patent on color Doppler imaging with normal direction of blood flow towards the liver. Other: No ascites IMPRESSION: Cholelithiasis with impacted 19 mm gallstone within the gallbladder neck and mild gallbladder distension. Positive sonographic Percell Miller sign may reflect early changes  of acute calculus cholecystitis. Correlation with liver enzymes is recommended. Mild hepatomegaly.  Moderate hepatic steatosis. Electronically Signed   By: Fidela Salisbury M.D.   On: 10/24/2021 19:21   ? ?Procedures ?Marland KitchenCritical Care ?Performed by: Lianne Cure, DO ?Authorized by: Lianne Cure, DO  ? ?Critical care provider statement:  ?  Critical care time (minutes):  74 ?  Critical care was necessary to treat or prevent imminent or life-threatening deterioration of the following conditions: impacted stone in gallbladder neck. ?  Critical care was time spent personally by me on the following activities:  Development of treatment plan with patient or surrogate, discussions with consultants, evaluation of patient's response to treatment, examination of patient, ordering and review of laboratory studies, ordering and review of radiographic studies, ordering and performing treatments and interventions, pulse oximetry, re-evaluation of patient's condition and review of old charts ?  Care discussed with comment:  GI, general surgery, and hospitalist  ? ? ?Medications Ordered in ED ?Medications  ?sodium chloride 0.9 % bolus 1,000 mL (has no administration in  time range)  ?cefTRIAXone (ROCEPHIN) 2 g in sodium chloride 0.9 % 100 mL IVPB (has no administration in time range)  ?metroNIDAZOLE (FLAGYL) IVPB 500 mg (has no administration in time range)  ?ondansetron (ZOFRA

## 2021-10-24 NOTE — Progress Notes (Signed)
Pt arrived to unit via [facility]. Pt is alert and oriented x 4. Pt's abdominal pain is at 6/10 at this moment. Awaiting for doctor's order. Will monitor pt. ?

## 2021-10-24 NOTE — Assessment & Plan Note (Signed)
Continue Prn xanax. ?

## 2021-10-24 NOTE — Assessment & Plan Note (Signed)
Continue CPAP.  

## 2021-10-25 ENCOUNTER — Encounter (HOSPITAL_COMMUNITY): Payer: Self-pay | Admitting: General Surgery

## 2021-10-25 ENCOUNTER — Observation Stay (HOSPITAL_COMMUNITY): Payer: Self-pay | Admitting: Certified Registered Nurse Anesthetist

## 2021-10-25 ENCOUNTER — Other Ambulatory Visit: Payer: Self-pay

## 2021-10-25 ENCOUNTER — Encounter (HOSPITAL_COMMUNITY)
Admission: EM | Disposition: A | Discharge status: Home / Self Care | Payer: Self-pay | Source: Home / Self Care | Attending: Internal Medicine

## 2021-10-25 ENCOUNTER — Inpatient Hospital Stay (HOSPITAL_COMMUNITY): Payer: Self-pay

## 2021-10-25 DIAGNOSIS — F418 Other specified anxiety disorders: Secondary | ICD-10-CM

## 2021-10-25 DIAGNOSIS — I10 Essential (primary) hypertension: Secondary | ICD-10-CM

## 2021-10-25 DIAGNOSIS — F17210 Nicotine dependence, cigarettes, uncomplicated: Secondary | ICD-10-CM

## 2021-10-25 DIAGNOSIS — K812 Acute cholecystitis with chronic cholecystitis: Secondary | ICD-10-CM

## 2021-10-25 DIAGNOSIS — K7581 Nonalcoholic steatohepatitis (NASH): Secondary | ICD-10-CM

## 2021-10-25 HISTORY — PX: CHOLECYSTECTOMY: SHX55

## 2021-10-25 LAB — COMPREHENSIVE METABOLIC PANEL
ALT: 62 U/L — ABNORMAL HIGH (ref 0–44)
AST: 40 U/L (ref 15–41)
Albumin: 4.1 g/dL (ref 3.5–5.0)
Alkaline Phosphatase: 65 U/L (ref 38–126)
Anion gap: 7 (ref 5–15)
BUN: 18 mg/dL (ref 6–20)
CO2: 25 mmol/L (ref 22–32)
Calcium: 8.9 mg/dL (ref 8.9–10.3)
Chloride: 105 mmol/L (ref 98–111)
Creatinine, Ser: 0.44 mg/dL (ref 0.44–1.00)
GFR, Estimated: >60 mL/min (ref >60)
Glucose, Bld: 100 mg/dL — ABNORMAL HIGH (ref 70–99)
Potassium: 3.9 mmol/L (ref 3.5–5.1)
Sodium: 137 mmol/L (ref 135–145)
Total Bilirubin: 0.6 mg/dL (ref 0.3–1.2)
Total Protein: 7.4 g/dL (ref 6.5–8.1)

## 2021-10-25 LAB — CBC WITH DIFFERENTIAL/PLATELET
Abs Immature Granulocytes: 0.06 10*3/uL (ref 0.00–0.07)
Basophils Absolute: 0.1 10*3/uL (ref 0.0–0.1)
Basophils Relative: 1 %
Eosinophils Absolute: 0.4 10*3/uL (ref 0.0–0.5)
Eosinophils Relative: 3 %
HCT: 44.3 % (ref 36.0–46.0)
Hemoglobin: 15 g/dL (ref 12.0–15.0)
Immature Granulocytes: 1 %
Lymphocytes Relative: 33 %
Lymphs Abs: 4.2 10*3/uL — ABNORMAL HIGH (ref 0.7–4.0)
MCH: 31.3 pg (ref 26.0–34.0)
MCHC: 33.9 g/dL (ref 30.0–36.0)
MCV: 92.3 fL (ref 80.0–100.0)
Monocytes Absolute: 1.3 10*3/uL — ABNORMAL HIGH (ref 0.1–1.0)
Monocytes Relative: 10 %
Neutro Abs: 6.9 10*3/uL (ref 1.7–7.7)
Neutrophils Relative %: 52 %
Platelets: 336 10*3/uL (ref 150–400)
RBC: 4.8 MIL/uL (ref 3.87–5.11)
RDW: 13.5 % (ref 11.5–15.5)
WBC: 12.9 10*3/uL — ABNORMAL HIGH (ref 4.0–10.5)
nRBC: 0 % (ref 0.0–0.2)

## 2021-10-25 LAB — HIV ANTIBODY (ROUTINE TESTING W REFLEX): HIV Screen 4th Generation wRfx: NONREACTIVE

## 2021-10-25 LAB — SURGICAL PCR SCREEN
MRSA, PCR: NEGATIVE
Staphylococcus aureus: NEGATIVE

## 2021-10-25 LAB — GLUCOSE, CAPILLARY: Glucose-Capillary: 81 mg/dL (ref 70–99)

## 2021-10-25 SURGERY — LAPAROSCOPIC CHOLECYSTECTOMY WITH INTRAOPERATIVE CHOLANGIOGRAM
Anesthesia: General

## 2021-10-25 MED ORDER — LACTATED RINGERS IV SOLN: INTRAVENOUS | Status: AC

## 2021-10-25 MED ORDER — ACETAMINOPHEN 10 MG/ML IV SOLN
1000.0000 mg | Freq: Once | INTRAVENOUS | Status: DC | PRN
  Administered 2021-10-25: 1000 mg via INTRAVENOUS

## 2021-10-25 MED ORDER — 0.9 % SODIUM CHLORIDE (POUR BTL) OPTIME
TOPICAL | Status: DC | PRN
  Administered 2021-10-25: 1000 mL

## 2021-10-25 MED ORDER — FENTANYL CITRATE PF 50 MCG/ML IJ SOSY
25.0000 ug | PREFILLED_SYRINGE | INTRAMUSCULAR | Status: DC | PRN
  Administered 2021-10-25 (×3): 50 ug via INTRAVENOUS

## 2021-10-25 MED ORDER — LIDOCAINE 2% (20 MG/ML) 5 ML SYRINGE
INTRAMUSCULAR | Status: DC | PRN
  Administered 2021-10-25: 60 mg via INTRAVENOUS

## 2021-10-25 MED ORDER — BUPIVACAINE-EPINEPHRINE 0.25% -1:200000 IJ SOLN
INTRAMUSCULAR | Status: DC | PRN
  Administered 2021-10-25: 30 mL

## 2021-10-25 MED ORDER — OXYCODONE HCL 5 MG PO TABS: 5.0000 mg | ORAL_TABLET | Freq: Once | ORAL | Status: DC | PRN

## 2021-10-25 MED ORDER — IOHEXOL 300 MG/ML  SOLN
INTRAMUSCULAR | Status: DC | PRN
  Administered 2021-10-25: 5 mL

## 2021-10-25 MED ORDER — LACTATED RINGERS IV BOLUS
1000.0000 mL | Freq: Three times a day (TID) | INTRAVENOUS | Status: DC | PRN
Start: 2021-10-25 — End: 2021-10-26

## 2021-10-25 MED ORDER — FENTANYL CITRATE (PF) 250 MCG/5ML IJ SOLN
INTRAMUSCULAR | Status: AC
  Filled 2021-10-25: qty 5

## 2021-10-25 MED ORDER — IOPAMIDOL (ISOVUE-300) INJECTION 61%: INTRAVENOUS | Status: DC | PRN

## 2021-10-25 MED ORDER — BUPIVACAINE LIPOSOME 1.3 % IJ SUSP
INTRAMUSCULAR | Status: AC
  Filled 2021-10-25: qty 20

## 2021-10-25 MED ORDER — SODIUM CHLORIDE 0.9 % IV SOLN
8.0000 mg | Freq: Four times a day (QID) | INTRAVENOUS | Status: DC | PRN
  Filled 2021-10-25: qty 4

## 2021-10-25 MED ORDER — PREGABALIN 25 MG PO CAPS
25.0000 mg | ORAL_CAPSULE | Freq: Two times a day (BID) | ORAL | Status: DC
  Administered 2021-10-25 – 2021-10-26 (×2): 25 mg via ORAL
  Filled 2021-10-25 (×2): qty 1

## 2021-10-25 MED ORDER — FENTANYL CITRATE (PF) 100 MCG/2ML IJ SOLN
INTRAMUSCULAR | Status: AC
  Filled 2021-10-25: qty 2

## 2021-10-25 MED ORDER — DIPHENHYDRAMINE HCL 50 MG/ML IJ SOLN: 12.5000 mg | Freq: Four times a day (QID) | INTRAMUSCULAR | Status: DC | PRN

## 2021-10-25 MED ORDER — FENTANYL CITRATE PF 50 MCG/ML IJ SOSY
PREFILLED_SYRINGE | INTRAMUSCULAR | Status: AC
  Filled 2021-10-25: qty 3

## 2021-10-25 MED ORDER — SUGAMMADEX SODIUM 500 MG/5ML IV SOLN
INTRAVENOUS | Status: DC | PRN
  Administered 2021-10-25: 300 mg via INTRAVENOUS

## 2021-10-25 MED ORDER — LACTATED RINGERS IR SOLN
Status: DC | PRN
  Administered 2021-10-25 (×3): 1000 mL

## 2021-10-25 MED ORDER — TRAMADOL HCL 50 MG PO TABS
50.0000 mg | ORAL_TABLET | Freq: Four times a day (QID) | ORAL | Status: DC | PRN
  Administered 2021-10-26: 100 mg via ORAL
  Filled 2021-10-25: qty 2

## 2021-10-25 MED ORDER — BUPIVACAINE-EPINEPHRINE (PF) 0.25% -1:200000 IJ SOLN
INTRAMUSCULAR | Status: AC
  Filled 2021-10-25: qty 30

## 2021-10-25 MED ORDER — ONDANSETRON HCL 4 MG/2ML IJ SOLN
4.0000 mg | Freq: Four times a day (QID) | INTRAMUSCULAR | Status: DC | PRN
Start: 2021-10-25 — End: 2021-10-26

## 2021-10-25 MED ORDER — AMISULPRIDE (ANTIEMETIC) 5 MG/2ML IV SOLN: 10.0000 mg | Freq: Once | INTRAVENOUS | Status: DC | PRN

## 2021-10-25 MED ORDER — METHOCARBAMOL 1000 MG/10ML IJ SOLN
1000.0000 mg | Freq: Four times a day (QID) | INTRAMUSCULAR | Status: DC | PRN
  Administered 2021-10-25 – 2021-10-26 (×2): 1000 mg via INTRAVENOUS
  Filled 2021-10-25 (×2): qty 1000
  Filled 2021-10-25: qty 10

## 2021-10-25 MED ORDER — METHOCARBAMOL 500 MG PO TABS
1000.0000 mg | ORAL_TABLET | Freq: Four times a day (QID) | ORAL | Status: DC | PRN
Start: 2021-10-25 — End: 2021-10-26

## 2021-10-25 MED ORDER — SODIUM CHLORIDE 0.9% FLUSH
3.0000 mL | Freq: Two times a day (BID) | INTRAVENOUS | Status: DC
  Administered 2021-10-26: 3 mL via INTRAVENOUS

## 2021-10-25 MED ORDER — ESMOLOL HCL 100 MG/10ML IV SOLN
INTRAVENOUS | Status: DC | PRN
  Administered 2021-10-25: 30 mg via INTRAVENOUS

## 2021-10-25 MED ORDER — PHENYLEPHRINE HCL (PRESSORS) 10 MG/ML IV SOLN
INTRAVENOUS | Status: DC | PRN
  Administered 2021-10-25 (×2): 160 ug via INTRAVENOUS
  Administered 2021-10-25: 240 ug via INTRAVENOUS
  Administered 2021-10-25: 160 ug via INTRAVENOUS
  Administered 2021-10-25 (×2): 80 ug via INTRAVENOUS

## 2021-10-25 MED ORDER — ENOXAPARIN SODIUM 40 MG/0.4ML IJ SOSY: 40.0000 mg | PREFILLED_SYRINGE | INTRAMUSCULAR | Status: DC

## 2021-10-25 MED ORDER — FENTANYL CITRATE (PF) 250 MCG/5ML IJ SOLN
INTRAMUSCULAR | Status: DC | PRN
  Administered 2021-10-25: 150 ug via INTRAVENOUS
  Administered 2021-10-25 (×2): 100 ug via INTRAVENOUS

## 2021-10-25 MED ORDER — MIDAZOLAM HCL 2 MG/2ML IJ SOLN
INTRAMUSCULAR | Status: AC
  Filled 2021-10-25: qty 2

## 2021-10-25 MED ORDER — ESMOLOL HCL 100 MG/10ML IV SOLN
INTRAVENOUS | Status: AC
  Filled 2021-10-25: qty 10

## 2021-10-25 MED ORDER — BUPIVACAINE LIPOSOME 1.3 % IJ SUSP
INTRAMUSCULAR | Status: DC | PRN
  Administered 2021-10-25: 20 mL

## 2021-10-25 MED ORDER — KETOROLAC TROMETHAMINE 30 MG/ML IJ SOLN
30.0000 mg | Freq: Once | INTRAMUSCULAR | Status: AC
  Administered 2021-10-25: 30 mg via INTRAVENOUS

## 2021-10-25 MED ORDER — SIMETHICONE 40 MG/0.6ML PO SUSP
80.0000 mg | Freq: Four times a day (QID) | ORAL | Status: DC | PRN
  Filled 2021-10-25: qty 1.2

## 2021-10-25 MED ORDER — DEXMEDETOMIDINE (PRECEDEX) IN NS 20 MCG/5ML (4 MCG/ML) IV SYRINGE
PREFILLED_SYRINGE | INTRAVENOUS | Status: DC | PRN
  Administered 2021-10-25: 20 ug via INTRAVENOUS

## 2021-10-25 MED ORDER — MIDAZOLAM HCL 5 MG/5ML IJ SOLN
INTRAMUSCULAR | Status: DC | PRN
  Administered 2021-10-25: 2 mg via INTRAVENOUS

## 2021-10-25 MED ORDER — SODIUM CHLORIDE 0.9 % IV SOLN: 250.0000 mL | INTRAVENOUS | Status: DC | PRN

## 2021-10-25 MED ORDER — ACETAMINOPHEN 10 MG/ML IV SOLN
INTRAVENOUS | Status: AC
  Filled 2021-10-25: qty 100

## 2021-10-25 MED ORDER — PROCHLORPERAZINE EDISYLATE 10 MG/2ML IJ SOLN: 5.0000 mg | INTRAMUSCULAR | Status: DC | PRN

## 2021-10-25 MED ORDER — EPHEDRINE SULFATE (PRESSORS) 50 MG/ML IJ SOLN
INTRAMUSCULAR | Status: DC | PRN
  Administered 2021-10-25: 10 mg via INTRAVENOUS

## 2021-10-25 MED ORDER — OXYCODONE HCL 5 MG/5ML PO SOLN: 5.0000 mg | Freq: Once | ORAL | Status: DC | PRN

## 2021-10-25 MED ORDER — ENALAPRILAT 1.25 MG/ML IV SOLN
0.6250 mg | Freq: Four times a day (QID) | INTRAVENOUS | Status: DC | PRN
  Filled 2021-10-25: qty 1

## 2021-10-25 MED ORDER — HYDROCORTISONE 1 % EX CREA
1.0000 "application " | TOPICAL_CREAM | Freq: Two times a day (BID) | CUTANEOUS | Status: DC
  Administered 2021-10-26 (×2): 1 via TOPICAL
  Filled 2021-10-25: qty 28

## 2021-10-25 MED ORDER — PROPOFOL 10 MG/ML IV BOLUS
INTRAVENOUS | Status: DC | PRN
  Administered 2021-10-25: 200 mg via INTRAVENOUS

## 2021-10-25 MED ORDER — DEXAMETHASONE SODIUM PHOSPHATE 10 MG/ML IJ SOLN
INTRAMUSCULAR | Status: DC | PRN
Start: 2021-10-25 — End: 2021-10-25
  Administered 2021-10-25: 10 mg via INTRAVENOUS

## 2021-10-25 MED ORDER — ONDANSETRON HCL 4 MG/2ML IJ SOLN
INTRAMUSCULAR | Status: DC | PRN
  Administered 2021-10-25: 4 mg via INTRAVENOUS

## 2021-10-25 MED ORDER — CALCIUM POLYCARBOPHIL 625 MG PO TABS
625.0000 mg | ORAL_TABLET | Freq: Two times a day (BID) | ORAL | Status: DC
Start: 2021-10-25 — End: 2021-10-26
  Administered 2021-10-25 – 2021-10-26 (×2): 625 mg via ORAL
  Filled 2021-10-25 (×2): qty 1

## 2021-10-25 MED ORDER — BISACODYL 10 MG RE SUPP
10.0000 mg | Freq: Two times a day (BID) | RECTAL | Status: DC | PRN
Start: 2021-10-25 — End: 2021-10-26

## 2021-10-25 MED ORDER — HYDROMORPHONE HCL 1 MG/ML IJ SOLN
0.5000 mg | INTRAMUSCULAR | Status: DC | PRN
  Administered 2021-10-25 – 2021-10-26 (×3): 1 mg via INTRAVENOUS
  Filled 2021-10-25 (×3): qty 1

## 2021-10-25 MED ORDER — ROCURONIUM BROMIDE 10 MG/ML (PF) SYRINGE
PREFILLED_SYRINGE | INTRAVENOUS | Status: DC | PRN
  Administered 2021-10-25: 60 mg via INTRAVENOUS
  Administered 2021-10-25 (×2): 10 mg via INTRAVENOUS

## 2021-10-25 MED ORDER — KETOROLAC TROMETHAMINE 30 MG/ML IJ SOLN
INTRAMUSCULAR | Status: AC
  Filled 2021-10-25: qty 1

## 2021-10-25 MED ORDER — MAGIC MOUTHWASH
15.0000 mL | Freq: Four times a day (QID) | ORAL | Status: DC | PRN
  Filled 2021-10-25: qty 15

## 2021-10-25 MED ORDER — SODIUM CHLORIDE 0.9% FLUSH: 3.0000 mL | INTRAVENOUS | Status: DC | PRN

## 2021-10-25 SURGICAL SUPPLY — 50 items
APPLIER CLIP 5 13 M/L LIGAMAX5 (MISCELLANEOUS) ×2
APPLIER CLIP ROT 10 11.4 M/L (STAPLE)
APR CLP MED LRG 11.4X10 (STAPLE)
APR CLP MED LRG 5 ANG JAW (MISCELLANEOUS) ×1
BAG COUNTER SPONGE SURGICOUNT (BAG) ×1 IMPLANT
BAG SPNG CNTER NS LX DISP (BAG)
CABLE HIGH FREQUENCY MONO STRZ (ELECTRODE) ×1 IMPLANT
CLIP APPLIE 5 13 M/L LIGAMAX5 (MISCELLANEOUS) IMPLANT
CLIP APPLIE ROT 10 11.4 M/L (STAPLE) IMPLANT
COVER MAYO STAND XLG (MISCELLANEOUS) ×1 IMPLANT
COVER SURGICAL LIGHT HANDLE (MISCELLANEOUS) ×2 IMPLANT
DRAPE 3/4 80X56 (DRAPES) ×1 IMPLANT
DRAPE C-ARM 42X120 X-RAY (DRAPES) ×1 IMPLANT
DRAPE UTILITY XL STRL (DRAPES) ×1 IMPLANT
DRAPE WARM FLUID 44X44 (DRAPES) ×2 IMPLANT
DRSG TEGADERM 2-3/8X2-3/4 SM (GAUZE/BANDAGES/DRESSINGS) ×2 IMPLANT
DRSG TEGADERM 6X8 (GAUZE/BANDAGES/DRESSINGS) ×1 IMPLANT
DRSG TELFA 3X8 NADH (GAUZE/BANDAGES/DRESSINGS) ×2 IMPLANT
ELECT REM PT RETURN 15FT ADLT (MISCELLANEOUS) ×2 IMPLANT
ENDOLOOP SUT PDS II  0 18 (SUTURE)
ENDOLOOP SUT PDS II 0 18 (SUTURE) IMPLANT
GLOVE INDICATOR 8.0 STRL GRN (GLOVE) ×2 IMPLANT
GLOVE SKINSENSE NS SZ8.0 LF (GLOVE) ×1
GLOVE SKINSENSE STRL SZ8.0 LF (GLOVE) ×1 IMPLANT
GOWN STRL REUS W/ TWL XL LVL3 (GOWN DISPOSABLE) ×3 IMPLANT
GOWN STRL REUS W/TWL XL LVL3 (GOWN DISPOSABLE) ×6
IRRIG SUCT STRYKERFLOW 2 WTIP (MISCELLANEOUS) ×2
IRRIGATION SUCT STRKRFLW 2 WTP (MISCELLANEOUS) ×1 IMPLANT
KIT BASIN OR (CUSTOM PROCEDURE TRAY) ×2 IMPLANT
KIT TURNOVER KIT A (KITS) ×1 IMPLANT
NDL BIOPSY 14X6 SOFT TISS (NEEDLE) IMPLANT
NEEDLE BIOPSY 14X6 SOFT TISS (NEEDLE) ×2 IMPLANT
PAD DRESSING TELFA 3X8 NADH (GAUZE/BANDAGES/DRESSINGS) IMPLANT
PENCIL SMOKE EVACUATOR (MISCELLANEOUS) IMPLANT
POUCH RETRIEVAL ECOSAC 10 (ENDOMECHANICALS) ×1 IMPLANT
POUCH RETRIEVAL ECOSAC 10MM (ENDOMECHANICALS) ×2
SCISSORS LAP 5X35 DISP (ENDOMECHANICALS) ×2 IMPLANT
SET CHOLANGIOGRAPH MIX (MISCELLANEOUS) ×1 IMPLANT
SET TUBE SMOKE EVAC HIGH FLOW (TUBING) ×2 IMPLANT
SLEEVE XCEL OPT CAN 5 100 (ENDOMECHANICALS) ×2 IMPLANT
SPIKE FLUID TRANSFER (MISCELLANEOUS) ×1 IMPLANT
SUT MNCRL AB 4-0 PS2 18 (SUTURE) ×1 IMPLANT
SUT PDS AB 1 CT  36 (SUTURE) ×4
SUT PDS AB 1 CT 36 (SUTURE) IMPLANT
SYR 20ML LL LF (SYRINGE) ×2 IMPLANT
TOWEL OR 17X26 10 PK STRL BLUE (TOWEL DISPOSABLE) ×2 IMPLANT
TOWEL OR NON WOVEN STRL DISP B (DISPOSABLE) ×2 IMPLANT
TRAY LAPAROSCOPIC (CUSTOM PROCEDURE TRAY) ×2 IMPLANT
TROCAR BLADELESS OPT 5 100 (ENDOMECHANICALS) ×2 IMPLANT
TROCAR XCEL NON-BLD 11X100MML (ENDOMECHANICALS) ×2 IMPLANT

## 2021-10-25 NOTE — Transfer of Care (Signed)
Immediate Anesthesia Transfer of Care Note ? ?Patient: Theresa Sawyer ? ?Procedure(s) Performed: LAPAROSCOPIC CHOLECYSTECTOMY with INTRAOPERATIVE CHOLANGIOGRAM ? ?Patient Location: PACU ? ?Anesthesia Type:General ? ?Level of Consciousness: awake, alert  and oriented ? ?Airway & Oxygen Therapy: Patient Spontanous Breathing and Patient connected to face mask oxygen ? ?Post-op Assessment: Report given to RN and Post -op Vital signs reviewed and stable ? ?Post vital signs: Reviewed and stable ? ?Last Vitals:  ?Vitals Value Taken Time  ?BP 138/82 10/25/21 1730  ?Temp    ?Pulse 89 10/25/21 1730  ?Resp 15 10/25/21 1730  ?SpO2 95 % 10/25/21 1730  ?Vitals shown include unvalidated device data. ? ?Last Pain:  ?Vitals:  ? 10/25/21 1349  ?TempSrc: Oral  ?PainSc:   ?   ? ?Patients Stated Pain Goal: 2 (10/25/21 0507) ? ?Complications: No notable events documented. ?

## 2021-10-25 NOTE — Anesthesia Preprocedure Evaluation (Signed)
Anesthesia Evaluation  ?Patient identified by MRN, date of birth, ID band ?Patient awake ? ? ? ?Reviewed: ?Allergy & Precautions, NPO status , Patient's Chart, lab work & pertinent test results ? ?Airway ?Mallampati: III ? ?TM Distance: >3 FB ?Neck ROM: Full ? ? ? Dental ?no notable dental hx. ? ?  ?Pulmonary ?sleep apnea and Continuous Positive Airway Pressure Ventilation , Current Smoker and Patient abstained from smoking.,  ?  ?Pulmonary exam normal ? ? ? ? ? ? ? Cardiovascular ?hypertension, Pt. on medications ?Normal cardiovascular exam ? ? ?  ?Neuro/Psych ? Headaches, PSYCHIATRIC DISORDERS Anxiety Depression   ? GI/Hepatic ?Neg liver ROS, GERD  ,  ?Endo/Other  ?Morbid obesity ? Renal/GU ?negative Renal ROS  ? ?  ?Musculoskeletal ? ?(+) Arthritis ,  ? Abdominal ?(+) + obese,   ?Peds ? Hematology ?negative hematology ROS ?(+)   ?Anesthesia Other Findings ?ACUTE CHOLECYSTITIS ? Reproductive/Obstetrics ?S/p hysterectomy ? ?  ? ? ? ? ? ? ? ? ? ? ? ? ? ?  ?  ? ? ? ? ? ? ? ? ?Anesthesia Physical ?Anesthesia Plan ? ?ASA: 3 ? ?Anesthesia Plan: General  ? ?Post-op Pain Management:   ? ?Induction: Intravenous ? ?PONV Risk Score and Plan: 3 and Ondansetron, Dexamethasone, Midazolam and Treatment may vary due to age or medical condition ? ?Airway Management Planned: Oral ETT ? ?Additional Equipment:  ? ?Intra-op Plan:  ? ?Post-operative Plan: Extubation in OR ? ?Informed Consent: I have reviewed the patients History and Physical, chart, labs and discussed the procedure including the risks, benefits and alternatives for the proposed anesthesia with the patient or authorized representative who has indicated his/her understanding and acceptance.  ? ? ? ?Dental advisory given ? ?Plan Discussed with: CRNA ? ?Anesthesia Plan Comments:   ? ? ? ? ? ? ?Anesthesia Quick Evaluation ? ?

## 2021-10-25 NOTE — Discharge Instructions (Addendum)
Do not take home percocet when taking tramadol or scheduled tyelnol as you will get too much Tylenol. ? ? ?################################################################ ? ?LAPAROSCOPIC SURGERY: POST OP INSTRUCTIONS ? ?###################################################################### ? ?EAT ?Gradually transition to a high fiber diet with a fiber supplement over the next few weeks after discharge.  Start with a pureed / full liquid diet (see below) ? ?WALK ?Walk an hour a day.  Control your pain to do that.   ? ?CONTROL PAIN ?Control pain so that you can walk, sleep, tolerate sneezing/coughing, go up/down stairs. ? ?HAVE A BOWEL MOVEMENT DAILY ?Keep your bowels regular to avoid problems.  OK to try a laxative to override constipation.  OK to use an antidairrheal to slow down diarrhea.  Call if not better after 2 tries ? ?CALL IF YOU HAVE PROBLEMS/CONCERNS ?Call if you are still struggling despite following these instructions. ?Call if you have concerns not answered by these instructions ? ?###################################################################### ? ? ? ?DIET: Follow a light bland diet & liquids the first 24 hours after arrival home, such as soup, liquids, starches, etc.  Be sure to drink plenty of fluids.  Quickly advance to a usual solid diet within a few days.  Avoid fast food or heavy meals as your are more likely to get nauseated or have irregular bowels.  A low-fat, high-fiber diet for the rest of your life is ideal. ? ?Take your usually prescribed home medications unless otherwise directed. ? ?PAIN CONTROL: ?Pain is best controlled by a usual combination of three different methods TOGETHER: ?Ice/Heat ?Over the counter pain medication ?Prescription pain medication ?Most patients will experience some swelling and bruising around the incisions.  Ice packs or heating pads (30-60 minutes up to 6 times a day) will help. Use ice for the first few days to help decrease swelling and bruising, then  switch to heat to help relax tight/sore spots and speed recovery.  Some people prefer to use ice alone, heat alone, alternating between ice & heat.  Experiment to what works for you.  Swelling and bruising can take several weeks to resolve.   ?It is helpful to take an over-the-counter pain medication regularly for the first few weeks.  Choose one of the following that works best for you: ?Naproxen (Aleve, etc)  Two 257m tabs twice a day ?Ibuprofen (Advil, etc) Three 2013mtabs four times a day (every meal & bedtime) ?Acetaminophen (Tylenol, etc) 500-65015mour times a day (every meal & bedtime) ?A  prescription for pain medication (such as oxycodone, hydrocodone, tramadol, gabapentin, methocarbamol, etc) should be given to you upon discharge.  Take your pain medication as prescribed.  ?If you are having problems/concerns with the prescription medicine (does not control pain, nausea, vomiting, rash, itching, etc), please call us [phone] see if we need to switch you to a different pain medicine that will work better for you and/or control your side effect better. ?If you need a refill on your pain medication, please give us [place] hour notice.  contact your pharmacy.  They will contact our office to request authorization. Prescriptions will not be filled after 5 pm or on week-ends ? ?Avoid getting constipated.   ?Between the surgery and the pain medications, it is common to experience some constipation.   ?Increasing fluid intake and taking a fiber supplement (such as Metamucil, Citrucel, FiberCon, MiraLax, etc) 1-2 times a day regularly will usually help prevent this problem from occurring.   ?A mild laxative (prune juice, Milk of Magnesia, MiraLax, etc) should be  taken according to package directions if there are no bowel movements after 48 hours.   ?Watch out for diarrhea.   ?If you have many loose bowel movements, simplify your diet to bland foods & liquids for a few days.   ?Stop any stool softeners and  decrease your fiber supplement.   ?Switching to mild anti-diarrheal medications (Kayopectate, Pepto Bismol) can help.   ?If this worsens or does not improve, please call us. ? ?Wash / shower every day.  You may shower over the dressings as they are waterproof.  Continue to shower over incision(s) after the dressing is off. ? ?REMOVE ALL DRESSINGS: Remove your waterproof bandages (tegaderm clear band-aids, steristrip skin tapes, etc) THREE DAYS AFTER SURGERY.  You may leave the incisions open to air.  You may replace a dressing/Band-Aid to cover the incision for comfort if you wish.  ? ?ACTIVITIES as tolerated:   ?You may resume regular (light) daily activities beginning the next day--such as daily self-care, walking, climbing stairs--gradually increasing activities as tolerated.  If you can walk 30 minutes without difficulty, it is safe to try more intense activity such as jogging, treadmill, bicycling, low-impact aerobics, swimming, etc. ?Save the most intensive and strenuous activity for last such as sit-ups, heavy lifting, contact sports, etc  Refrain from any heavy lifting or straining until you are off narcotics for pain control.   ?DO NOT PUSH THROUGH PAIN.  Let pain be your guide: If it hurts to do something, don't do it.  Pain is your body warning you to avoid that activity for another week until the pain goes down. ?You may drive when you are no longer taking prescription pain medication, you can comfortably wear a seatbelt, and you can safely maneuver your car and apply brakes. ?You may have sexual intercourse when it is comfortable. ? ?FOLLOW UP in our office ?Please call [facility] at [phone] to set up an appointment to see your surgeon in the office for a follow-up appointment approximately 2-3 weeks after your surgery. ?Make sure that you call for this appointment the day you arrive home to insure a convenient appointment time. ? ?10. IF YOU HAVE DISABILITY OR FAMILY LEAVE FORMS, BRING THEM TO THE  OFFICE FOR PROCESSING.  DO NOT GIVE THEM TO YOUR DOCTOR. ? ? ?WHEN TO CALL us [phone]: ?Poor pain control ?Reactions / problems with new medications (rash/itching, nausea, etc)  ?Fever over 101.5 F (38.5 C) ?Inability to urinate ?Nausea and/or vomiting ?Worsening swelling or bruising ?Continued bleeding from incision. ?Increased pain, redness, or drainage from the incision ? ? The clinic staff is available to answer your questions during regular business hours (8:30am-5pm).  Please don?t hesitate to call and ask to speak to one of our nurses for clinical concerns.  ? If you have a medical emergency, go to the nearest emergency room or call 911. ? A surgeon from [facility] is always on call at the hospitals ? ? ?[facility], [place] ?[address], [place], [place], [place]  [address] ? ?MAIN: [phone] ? TOLL FREE: [phone] ?  ?FAX [phone] ?[email or web address] ? ?############################################################## ? ? ? ? ?Managing Your Pain After Surgery Without Opioids ? ? ? ?Thank you for participating in our program to help patients manage their pain after surgery without opioids. This is part of our effort to provide you with the best care possible, without exposing you or your family to the risk that opioids pose. ? ?What pain can I  expect after surgery? ?You can expect to have some pain after surgery. This is normal. The pain is typically worse the day after surgery, and quickly begins to get better. ?Many studies have found that many patients are able to manage their pain after surgery with Over-the-Counter (OTC) medications such as Tylenol and Motrin. If you have a condition that does not allow you to take Tylenol or Motrin, notify your surgical team. ? ?How will I manage my pain? ?The best strategy for controlling your pain after surgery is around the clock pain control with Tylenol (acetaminophen) and Motrin (ibuprofen or Advil).  Alternating these medications with each other allows you to maximize your pain control. In addition to Tylenol and Motrin, you can use heating pads or ice packs on your incisions to help reduce your pain. ? ?

## 2021-10-25 NOTE — Interval H&P Note (Signed)
History and Physical Interval Note: ? ?10/25/2021 ?1:35 PM ? ?Theresa Sawyer  has presented today for surgery, with the diagnosis of ACUTE CHOLECYSTITIS.  The various methods of treatment have been discussed with the patient and family. After consideration of risks, benefits and other options for treatment, the patient has consented to  Procedure(s): ?LAPAROSCOPIC CHOLECYSTECTOMY WITH IPOSSIBLE NTRAOPERATIVE CHOLANGIOGRAM (N/A) as a surgical intervention.  The patient's history has been reviewed, patient examined, no change in status, stable for surgery.  I have reviewed the patient's chart and labs.  Questions were answered to the patient's satisfaction.   ? ?I have re-reviewed the the patient's records, history, medications, and allergies.  I have re-examined the patient.  I again discussed intraoperative plans and goals of post-operative recovery.  The patient agrees to proceed. ? ?Myrtie Hawk  ?08/31/1980 ?[id number] ? ?Patient Care Team: ?Horald Pollen, MD as PCP - General (Internal Medicine) ? ?Patient Active Problem List  ? Diagnosis Date Noted  ? Acute cholecystitis 10/24/2021  ? Dyslipidemia 10/30/2020  ? Prediabetes 10/30/2020  ? Postoperative state 09/28/2020  ? Essential hypertension 08/04/2020  ? Vitamin D deficiency 08/04/2020  ? Gastroesophageal reflux disease without esophagitis 08/04/2020  ? Migraine without aura and without status migrainosus, not intractable 08/04/2020  ? Primary osteoarthritis involving multiple joints 03/18/2020  ? Dysfunctional uterine bleeding 03/18/2020  ? Generalized anxiety disorder 03/18/2020  ? Psoriatic arthritis ([place]) 01/24/2020  ? Primary osteoarthritis of both knees 01/24/2020  ? History of hyperlipidemia 01/24/2020  ? History of iron deficiency anemia 01/24/2020  ? BMI 40.0-44.9, adult ([place]) 01/24/2020  ? OSA on CPAP 04/24/2019  ? Psoriasis 12/05/2018  ? Situational anxiety 12/05/2018  ? Intermittent hypertension 12/05/2018  ? ? ?Past Medical History:   ?Diagnosis Date  ? Abnormal uterine bleeding (AUB)   ? Anxiety   ? Arthritis   ? oa  ? Depression   ? GERD (gastroesophageal reflux disease)   ? History of chest pain (10-20-2019  pt denies any cardiac s&s)  ? mutliple ED visits, atypical chest pain, chest wall pain, muscularskeletal chest pain;  pt had cardiology evaulation dated 04-23-2018 note in epic , state atypical chest wall pain with negative d dimer and troponin's multiple times in epic, suggested CTA  (pt did not get done)  ? Hyperlipidemia   ? Hypertension   ? IDA (iron deficiency anemia)   ? none recently  ? Menorrhagia   ? Migraine   ? OSA on CPAP   ? followed by dr ather cpap set on 7 to 14  ? Psoriasis   ? Urinary frequency   ? ? ?Past Surgical History:  ?Procedure Laterality Date  ? DILATATION & CURETTAGE/HYSTEROSCOPY WITH MYOSURE N/A 10/24/2019  ? Procedure: DILATATION & CURETTAGE/HYSTEROSCOPY WITH MYOSURE;  Surgeon: Joseph Pierini, MD;  Location: [facility];  Service: Gynecology;  Laterality: N/A;  ? HYSTEROSCOPY WITH NOVASURE N/A 04/06/2020  ? Procedure: DILATION AND CURRETTAGE; NOVASURE ABLATION;  Surgeon: Joseph Pierini, MD;  Location: [facility];  Service: Gynecology;  Laterality: N/A;  ? LAPAROSCOPY N/A 04/06/2020  ? Procedure: LAPAROSCOPY OPERATIVE; LYSIS OF ADHESION;  Surgeon: Joseph Pierini, MD;  Location: [facility];  Service: Gynecology;  Laterality: N/A;  ? ROBOTIC ASSISTED TOTAL HYSTERECTOMY WITH BILATERAL SALPINGO OOPHERECTOMY Bilateral 09/28/2020  ? Procedure: XI ROBOTIC ASSISTED TOTAL LAPAROSCOPIC HYSTERECTOMY WITH BILATERAL SALPINGO OOPHORECTOMY;  Surgeon: Princess Bruins, MD;  Location: [facility];  Service: Gynecology;  Laterality: Bilateral;  request 8:30am OR start time  in [place] held time for Dr. Dellis Filbert ?requests 2 hours ?KATHY CONFIRMED ON 2/10 W/NICK FOR  TRACY (RNFA) TO ASSIST  ? TUBAL LIGATION Bilateral 02/05/2013  ? Procedure: POST PARTUM TUBAL LIGATION;   Surgeon: Woodroe Mode, MD;  Location: [facility];  Service: Gynecology;  Laterality: Bilateral;    Filshie clips  ? ? ?Social History  ? ?Socioeconomic History  ? Marital status: Legally Separated  ?  Spouse name: Not on file  ? Number of children: Not on file  ? Years of education: Not on file  ? Highest education level: Not on file  ?Occupational History  ? Not on file  ?Tobacco Use  ? Smoking status: Every Day  ?  Packs/day: 0.50  ?  Years: 20.00  ?  Pack years: 10.00  ?  Types: Cigarettes  ? Smokeless tobacco: Never  ?Vaping Use  ? Vaping Use: Never used  ?Substance and Sexual Activity  ? Alcohol use: No  ? Drug use: Never  ? Sexual activity: Yes  ?  Birth control/protection: Surgical  ?Other Topics Concern  ? Not on file  ?Social History Narrative  ? Not on file  ? ?Social Determinants of Health  ? ?Financial Resource Strain: Not on file  ?Food Insecurity: Not on file  ?Transportation Needs: Not on file  ?Physical Activity: Not on file  ?Stress: Not on file  ?Social Connections: Not on file  ?Intimate Partner Violence: Not on file  ? ? ?Family History  ?Problem Relation Age of Onset  ? Diabetes Maternal Grandmother   ? Diabetes Paternal Grandmother   ? Anxiety disorder Mother   ? Psoriasis Sister   ? Heart attack Brother 61  ?     deceased from this  ? Anxiety disorder Brother   ? Cirrhosis Maternal Grandfather   ?     alcohol related  ? Allergy (severe) Son   ? Healthy Son   ? Colon cancer Neg Hx   ? Esophageal cancer Neg Hx   ? Colon polyps Neg Hx   ? Stomach cancer Neg Hx   ? Pancreatic cancer Neg Hx   ? ? ?Medications Prior to Admission  ?Medication Sig Dispense Refill Last Dose  ? escitalopram (LEXAPRO) 20 MG tablet TAKE 1 TABLET(20 MG) BY MOUTH DAILY (Patient taking differently: Take 20 mg by mouth daily.) 90 tablet 3 10/24/2021  ? estradiol (VIVELLE-DOT) 0.1 MG/24HR patch Place 1 patch (0.1 mg total) onto the skin 2 (two) times a week. (Patient taking differently: Place 1 patch onto the skin 2 (two) times a  week. Sunday and Wednesday) 24 patch 3 10/23/2021  ? famotidine (PEPCID) 40 MG tablet TAKE 1 TABLET(40 MG) BY MOUTH TWICE DAILY (Patient taking differently: Take 40 mg by mouth 2 (two) times daily.) 60 tablet 3 10/24/2021  ? metFORMIN (GLUCOPHAGE) 500 MG tablet Take 1 tablet (500 mg total) by mouth 2 (two) times daily with a meal. 180 tablet 3 UNKNOWN  ? oxyCODONE-acetaminophen (PERCOCET/ROXICET) 5-325 MG tablet Take 1 tablet by mouth every 4 (four) hours as needed for moderate pain.   10/24/2021 at 0900  ? pregabalin (LYRICA) 25 MG capsule Take 25 mg by mouth 2 (two) times daily.   Past Week  ? rosuvastatin (CRESTOR) 10 MG tablet Take 1 tablet (10 mg total) by mouth daily. 90 tablet 3 UNKNOWN  ? valsartan (DIOVAN) 80 MG tablet TAKE 1 AND 1/2 TABLETS(120 MG) BY MOUTH DAILY (Patient taking differently: Take 120 mg by mouth daily.) 90 tablet 3 10/24/2021  ?  ALPRAZolam (XANAX) 0.5 MG tablet TAKE 1 TABLET BY MOUTH EVERY DAY AS NEEDED FOR ANXIETY (Patient not taking: Reported on 10/24/2021) 30 tablet 1 Not Taking  ? hydrochlorothiazide (HYDRODIURIL) 25 MG tablet Take 1 tablet (25 mg total) by mouth daily. 90 tablet 3 UNKNOWN  ? Vitamin D, Ergocalciferol, (DRISDOL) 1.25 MG (50000 UNIT) CAPS capsule TAKE 1 CAPSULE BY MOUTH EVERY 7 DAYS (Patient not taking: Reported on 10/24/2021) 5 capsule 3 Not Taking  ? ? ?Current Facility-Administered Medications  ?Medication Dose Route Frequency Provider Last Rate Last Admin  ? [MAR Hold] acetaminophen (TYLENOL) tablet 650 mg  650 mg Oral Q6H PRN Kristopher Oppenheim, DO      ? Or  ? [MAR Hold] acetaminophen (TYLENOL) suppository 650 mg  650 mg Rectal Q6H PRN Kristopher Oppenheim, DO      ? [MAR Hold] cefTRIAXone (ROCEPHIN) 2 g in sodium chloride 0.9 % 100 mL IVPB  2 g Intravenous Q24H Kristopher Oppenheim, DO      ? [MAR Hold] enoxaparin (LOVENOX) injection 40 mg  40 mg Subcutaneous Q24H Saverio Danker, PA-C      ? [MAR Hold] escitalopram (LEXAPRO) tablet 20 mg  20 mg Oral Daily Kristopher Oppenheim, DO      ? [MAR Hold]  famotidine (PEPCID) tablet 40 mg  40 mg Oral BID Kristopher Oppenheim, DO   40 mg at 10/24/21 2337  ? [MAR Hold] irbesartan (AVAPRO) tablet 150 mg  150 mg Oral Daily Kristopher Oppenheim, DO      ? lactated ringers infusion   Baltazar Apo

## 2021-10-25 NOTE — Progress Notes (Signed)
Transition of Care (TOC) Screening Note ? ?Patient Details  ?Name: Theresa Sawyer ?Date of Birth: [birth year] ? ?Transition of Care (TOC) CM/SW Contact:    ?Sherie Don, LCSW ?Phone Number: ?10/25/2021, 10:29 AM ? ?Transition of Care Department [facility] has reviewed patient and no TOC needs have been identified at this time. We will continue to monitor patient advancement through interdisciplinary progression rounds. If new patient transition needs arise, please place a TOC consult. ?

## 2021-10-25 NOTE — Progress Notes (Signed)
?PROGRESS NOTE ?Theresa Sawyer  WRU:[id number] DOB: 12-Jan-1981 DOA: 10/24/2021 ?PCP: Horald Pollen, MD  ? ?Brief Narrative/Hospital Course: ?41 yo F with prediabetes, hypertension, psoriatic arthritis/anxiety/depression presented with sudden abdominal pain after eating dinner preceded by feeling unwell with intermittent right-sided back pain x 3 days ?Underwent further work-up in the ED and found to have acute cholecystits, stone in GB neck but doesn't seem to be Mirizzi syndrome (no hepatic duct dilation, lower duct is only 1-14m. And LFTs unimpressive.  ?Gen surg was consulted advised admission under hospitalist.  Patient had leukocytosis 16.2 on admission showed fairly stable LFTs on the ALT at 65 and stable electrolytes negative troponins x2, MRSA screen negative.  ?  ?Subjective: ?Seen this morning.  She reports he still having pain and just received pain medication this morning.  Reports he is being scheduled for surgery this afternoon.  No nausea vomiting.  Not in distress. ? ?Assessment and Plan: ?Principal Problem: ?  Acute cholecystitis ?Active Problems: ?  Psoriasis ?  OSA on CPAP ?  BMI 40.0-44.9, adult ([place] ?  Generalized anxiety disorder ?  Essential hypertension ?  Gastroesophageal reflux disease without esophagitis ? ?Acute cholecystitis ?Impacted gallstone in gallbladder neck: ?Continue current IV antibiotics IV fluids n.p.o. pain control with IV opiates.  Surgery on board planning for operative intervention.Leukocytosis downtrending, afebrile.Postop we will request surgery to take over as the primary team. ? ?GERD-continue PPI ?Essential hypertension BP stable continue to hold HCTZ ?GAD on as needed Xanax ?Chronic psoriasis stable ?Morbid obesity with OBJY:NWGNFAO'ZBody mass index is 44.1 kg/m?. : Will benefit with PCP follow-up, weight loss  healthy lifestyle. Cont CPAP qhs ? ? ?DVT prophylaxis: enoxaparin (LOVENOX) injection 40 mg Start: 10/26/21 1400 ?SCDs Start: 10/24/21 2241 ?Code  Status:   Code Status: Full Code ?Family Communication: plan of care discussed with patient at bedside. ?Patient status is: Inpatient level of care: Med-Surg  ?Remains inpatient because: Ongoing management of acute cholecystitis ?Patient currently not stable ? ?Dispo: The patient is from: Home ?           Anticipated disposition: Home ? ?Mobility Assessment (last 72 hours)   ? ? Mobility Assessment   ? ? ROurayName 10/24/21 2210  ?  ?  ?  ?  ? Does patient have an order for bedrest or is patient medically unstable No - Continue assessment      ? What is the highest level of mobility based on the progressive mobility assessment? Level 6 (Walks independently in room and hall) - Balance while walking in room without assist - Complete      ? ?  ?  ? ?  ?  ? ?Objective: ?Vitals last 24 hrs: ?Vitals:  ? 10/24/21 2132 10/24/21 2215 10/25/21 0221 10/25/21 0512  ?BP:  107/65 112/68 124/69  ?Pulse:  79 75 79  ?Resp:  17 16 18   ?Temp: 97.8 ?F (36.6 ?C) 98.1 ?F (36.7 ?C) 98 ?F (36.7 ?C) 97.7 ?F (36.5 ?C)  ?TempSrc: Oral Oral Oral Oral  ?SpO2:  96% 98% 98%  ?Weight:      ?Height:      ? ?Weight change:  ? ?Physical Examination: ?General exam: AA,older than stated age, weak appearing. ?HEENT:Oral mucosa moist, Ear/Nose WNL grossly, dentition normal. ?Respiratory system: bilaterally diminished BS, no use of accessory muscle ?Cardiovascular system: S1 & S2 +, No JVD,. ?Gastrointestinal system: Abdomen soft, tender right upper quadrant ,ND, BS+ ?Nervous System:Alert, awake, moving extremities and grossly nonfocal ?Extremities: LE edema none,distal peripheral  pulses palpable.  ?Skin: No rashes,no icterus. ?MSK: Normal muscle bulk,tone, power ? ?Medications reviewed:  ?Scheduled Meds: ? [START ON 10/26/2021] enoxaparin (LOVENOX) injection  40 mg Subcutaneous Q24H  ? escitalopram  20 mg Oral Daily  ? famotidine  40 mg Oral BID  ? irbesartan  150 mg Oral Daily  ? ?Continuous Infusions: ? cefTRIAXone (ROCEPHIN)  IV    ? metronidazole 500 mg  (10/25/21 1761)  ? ? ?  ?Diet Order   ? ?       ?  Diet NPO time specified Except for: [employer], Sips with Meds  Diet effective now       ?  ? ?  ?  ? ?  ?  ? ?  ?  ?  ? ? ?Intake/Output Summary (Last 24 hours) at 10/25/2021 1150 ?Last data filed at 10/25/2021 0900 ?Gross per 24 hour  ?Intake 384.47 ml  ?Output 500 ml  ?Net -115.53 ml  ? ?Net IO Since Admission: -115.53 mL [10/25/21 1150]  ?Wt Readings from Last 3 Encounters:  ?2021/11/20 120.2 kg  ?10/28/20 116.5 kg  ?10/26/20 117.8 kg  ?  ? ?Unresulted Labs (From admission, onward)  ? ?  Start     Ordered  ? 10/25/21 0452  HIV Antibody (routine testing w rflx)  Once,   R       ? 10/25/21 0452  ? ?  ?  ? ?  ?Data Reviewed: I have personally reviewed following labs and imaging studies ?CBC: ?Recent Labs  ?Lab 11-20-2021 ?1903 10/25/21 ?0422  ?WBC 16.2* 12.9*  ?NEUTROABS  --  6.9  ?HGB 15.1* 15.0  ?HCT 45.0 44.3  ?MCV 89.1 92.3  ?PLT 182 336  ? ?Basic Metabolic Panel: ?Recent Labs  ?Lab 11/20/21 ?1535 2021/11/20 ?1903 10/25/21 ?0422  ?NA 132* 134* 137  ?K 3.5 3.7 3.9  ?CL 98 98 105  ?CO2 20* 24 25  ?GLUCOSE 104* 94 100*  ?BUN 23* 20 18  ?CREATININE 0.76 0.68 0.44  ?CALCIUM 10.1 10.2 8.9  ? ?GFR: ?Estimated Creatinine Clearance: 119 mL/min (by C-G formula based on SCr of 0.44 mg/dL). ?Liver Function Tests: ?Recent Labs  ?Lab 2021-11-20 ?1903 10/25/21 ?0422  ?AST 37 40  ?ALT 65* 62*  ?ALKPHOS 74 65  ?BILITOT 0.7 0.6  ?PROT 8.0 7.4  ?ALBUMIN 4.9 4.1  ? ?Recent Labs  ?Lab 2021-11-20 ?1903  ?LIPASE 16  ? ?No results for input(s): AMMONIA in the last 168 hours. ?Coagulation Profile: ?No results for input(s): INR, PROTIME in the last 168 hours. ?BNP (last 3 results) ?No results for input(s): PROBNP in the last 8760 hours. ?HbA1C: ?No results for input(s): HGBA1C in the last 72 hours. ?CBG: ?No results for input(s): GLUCAP in the last 168 hours. ?Lipid Profile: ?No results for input(s): CHOL, HDL, LDLCALC, TRIG, CHOLHDL, LDLDIRECT in the last 72 hours. ?Thyroid Function Tests: ?No  results for input(s): TSH, T4TOTAL, FREET4, T3FREE, THYROIDAB in the last 72 hours. ?Sepsis Labs: ?No results for input(s): PROCALCITON, LATICACIDVEN in the last 168 hours. ? ?Recent Results (from the past 240 hour(s))  ?Surgical PCR screen     Status: None  ? Collection Time: 10/25/21  5:31 AM  ? Specimen: Nasal Mucosa; Nasal Swab  ?Result Value Ref Range Status  ? MRSA, PCR NEGATIVE NEGATIVE Final  ? Staphylococcus aureus NEGATIVE NEGATIVE Final  ?  Comment: (NOTE) ?The Xpert SA Assay (FDA approved for NASAL specimens in patients 29 ?years of age and older), is one component of a comprehensive ?  surveillance program. It is not intended to diagnose infection nor to ?guide or monitor treatment. ?Performed at [facility], [address] ?[place], [place] [address] ?  ?  ?Antimicrobials: ?Anti-infectives (From admission, onward)  ? ? Start     Dose/Rate Route Frequency Ordered Stop  ? 10/25/21 2000  cefTRIAXone (ROCEPHIN) 2 g in sodium chloride 0.9 % 100 mL IVPB       ? 2 g ?200 mL/hr over 30 Minutes Intravenous Every 24 hours 10/24/21 2240    ? 10/25/21 1000  metroNIDAZOLE (FLAGYL) IVPB 500 mg       ? 500 mg ?100 mL/hr over 60 Minutes Intravenous Every 12 hours 10/24/21 2240    ? 10/24/21 2015  cefTRIAXone (ROCEPHIN) 2 g in sodium chloride 0.9 % 100 mL IVPB       ? 2 g ?200 mL/hr over 30 Minutes Intravenous  Once 10/24/21 2002 10/24/21 2059  ? 10/24/21 2015  metroNIDAZOLE (FLAGYL) IVPB 500 mg       ? 500 mg ?100 mL/hr over 60 Minutes Intravenous  Once 10/24/21 2002 10/24/21 2310  ? ?  ? ?Culture/Microbiology ?   ?Component Value Date/Time  ? SDES  07/11/2019 2024  ?  THROAT ?Performed at [facility], [address] [place], [place] [address] ?  ? SPECREQUEST  07/11/2019 2024  ?  NONE ?Performed at [facility], [address], [place], [place] [address] ?  ? CULT  07/11/2019 2024  ?  NO GROUP A STREP (S.PYOGENES) ISOLATED ?Performed at [facility], [place] [address], [place], [place]  [address] ?  ? REPTSTATUS 07/13/2019 FINAL 07/11/2019 2024  ?Radiology Studies: ?DG Chest Portable 1 View ? ?Result Date: 10/24/2021 ?CLINICAL DATA:  Chest pain. EXAM: PORTABLE CHEST 1 VIEW COMPARISON:  Chest x-ray 01/2

## 2021-10-25 NOTE — Anesthesia Procedure Notes (Signed)
Procedure Name: Intubation ?Date/Time: 10/25/2021 4:29 PM ?Performed by: Gean Maidens, CRNA ?Pre-anesthesia Checklist: Patient identified, Emergency Drugs available, Suction available, Patient being monitored and Timeout performed ?Patient Re-evaluated:Patient Re-evaluated prior to induction ?Oxygen Delivery Method: Circle system utilized ?Preoxygenation: Pre-oxygenation with 100% oxygen ?Induction Type: IV induction ?Ventilation: Two handed mask ventilation required and Mask ventilation with difficulty ?Laryngoscope Size: Mac and 4 ?Grade View: Grade II ?Tube type: Oral ?Tube size: 7.0 mm ?Number of attempts: 1 ?Airway Equipment and Method: Stylet ?Placement Confirmation: ETT inserted through vocal cords under direct vision, positive ETCO2 and breath sounds checked- equal and bilateral ?Secured at: 21 cm ?Tube secured with: Tape ?Dental Injury: Teeth and Oropharynx as per pre-operative assessment  ? ? ? ? ?

## 2021-10-25 NOTE — Consult Note (Signed)
? ? ? ?Theresa Sawyer ?Aug 13, 1980  ?[id number].   ? ?Requesting MD: Dr. Antonieta Pert ?Chief Complaint/Reason for Consult: cholecystitis ? ?HPI:  ?This is a pleasant 41 yo white female with a history of HTN, GERD, prediabetes, HLD, psoriatic arthritis (not on immunomodulators currently), and OSA, who has been having intermittent nausea and RUQ abdominal pain for the last month.  She had mentioned this to her pcp but was felt to be msk related as it was more painful with movements at times.  However, yesterday her nausea increased and she had some episodes of emesis.  She began having increasing RUQ abdominal pain that radiated to her back, chest pain, and hot sweats.  She denies any fevers or chills.  Nothing seemed to help her pain.  She presented to an outside ED where she underwent evaluation with an [place] that reveals a 36m stone lodged in the neck of the gallbladder.  Her WBC was 16.2, down to 12.9 today.  Her LFTs are normal except her ALT is slightly elevated at 62.  She has been admitted and we have been asked to see her for further evaluation. ? ?ROS: ?ROS: Please see HPI, otherwise all other systems have been reviewed and are negative. ? ?Family History  ?Problem Relation Age of Onset  ? Diabetes Maternal Grandmother   ? Diabetes Paternal Grandmother   ? Anxiety disorder Mother   ? Psoriasis Sister   ? Heart attack Brother 365 ?     deceased from this  ? Anxiety disorder Brother   ? Cirrhosis Maternal Grandfather   ?     alcohol related  ? Allergy (severe) Son   ? Healthy Son   ? Colon cancer Neg Hx   ? Esophageal cancer Neg Hx   ? Colon polyps Neg Hx   ? Stomach cancer Neg Hx   ? Pancreatic cancer Neg Hx   ? ? ?Past Medical History:  ?Diagnosis Date  ? Abnormal uterine bleeding (AUB)   ? Anxiety   ? Arthritis   ? oa  ? Depression   ? GERD (gastroesophageal reflux disease)   ? History of chest pain (10-20-2019  pt denies any cardiac s&s)  ? mutliple ED visits, atypical chest pain, chest wall pain, muscularskeletal  chest pain;  pt had cardiology evaulation dated 04-23-2018 note in epic , state atypical chest wall pain with negative d dimer and troponin's multiple times in epic, suggested CTA  (pt did not get done)  ? Hyperlipidemia   ? Hypertension   ? IDA (iron deficiency anemia)   ? none recently  ? Menorrhagia   ? Migraine   ? OSA on CPAP   ? followed by dr ather cpap set on 7 to 14  ? Psoriasis   ? Urinary frequency   ? ? ?Past Surgical History:  ?Procedure Laterality Date  ? DILATATION & CURETTAGE/HYSTEROSCOPY WITH MYOSURE N/A 10/24/2019  ? Procedure: DILATATION & CURETTAGE/HYSTEROSCOPY WITH MYOSURE;  Surgeon: KJoseph Pierini MD;  [facility]  Service: Gynecology;  Laterality: N/A;  ? HYSTEROSCOPY WITH NOVASURE N/A 04/06/2020  ? Procedure: DILATION AND CURRETTAGE; NOVASURE ABLATION;  Surgeon: KJoseph Pierini MD;  [facility]  Service: Gynecology;  Laterality: N/A;  ? LAPAROSCOPY N/A 04/06/2020  ? Procedure: LAPAROSCOPY OPERATIVE; LYSIS OF ADHESION;  Surgeon: KJoseph Pierini MD;  [facility]  Service: Gynecology;  Laterality: N/A;  ? ROBOTIC ASSISTED TOTAL HYSTERECTOMY WITH BILATERAL SALPINGO OOPHERECTOMY Bilateral 09/28/2020  ? Procedure:  XI ROBOTIC ASSISTED TOTAL LAPAROSCOPIC HYSTERECTOMY WITH BILATERAL SALPINGO OOPHORECTOMY;  Surgeon: Princess Bruins, MD;  Location: [facility];  Service: Gynecology;  Laterality: Bilateral;  request 8:30am OR start time in [place] held time for Dr. Dellis Filbert ?requests 2 hours ?KATHY CONFIRMED ON 2/10 W/NICK FOR  TRACY (RNFA) TO ASSIST  ? TUBAL LIGATION Bilateral 02/05/2013  ? Procedure: POST PARTUM TUBAL LIGATION;  Surgeon: Woodroe Mode, MD;  Location: [facility];  Service: Gynecology;  Laterality: Bilateral;    Filshie clips  ? ? ?Social History:  reports that she has been smoking cigarettes. She has a 10.00 pack-year smoking history. She has never used smokeless tobacco. She reports that she  does not drink alcohol and does not use drugs. ? ?Allergies: No Known Allergies ? ?Medications Prior to Admission  ?Medication Sig Dispense Refill  ? escitalopram (LEXAPRO) 20 MG tablet TAKE 1 TABLET(20 MG) BY MOUTH DAILY (Patient taking differently: Take 20 mg by mouth daily.) 90 tablet 3  ? estradiol (VIVELLE-DOT) 0.1 MG/24HR patch Place 1 patch (0.1 mg total) onto the skin 2 (two) times a week. (Patient taking differently: Place 1 patch onto the skin 2 (two) times a week. Sunday and Wednesday) 24 patch 3  ? famotidine (PEPCID) 40 MG tablet TAKE 1 TABLET(40 MG) BY MOUTH TWICE DAILY (Patient taking differently: Take 40 mg by mouth 2 (two) times daily.) 60 tablet 3  ? metFORMIN (GLUCOPHAGE) 500 MG tablet Take 1 tablet (500 mg total) by mouth 2 (two) times daily with a meal. 180 tablet 3  ? oxyCODONE-acetaminophen (PERCOCET/ROXICET) 5-325 MG tablet Take 1 tablet by mouth every 4 (four) hours as needed for moderate pain.    ? pregabalin (LYRICA) 25 MG capsule Take 25 mg by mouth 2 (two) times daily.    ? rosuvastatin (CRESTOR) 10 MG tablet Take 1 tablet (10 mg total) by mouth daily. 90 tablet 3  ? valsartan (DIOVAN) 80 MG tablet TAKE 1 AND 1/2 TABLETS(120 MG) BY MOUTH DAILY (Patient taking differently: Take 120 mg by mouth daily.) 90 tablet 3  ? ALPRAZolam (XANAX) 0.5 MG tablet TAKE 1 TABLET BY MOUTH EVERY DAY AS NEEDED FOR ANXIETY (Patient not taking: Reported on 10/24/2021) 30 tablet 1  ? hydrochlorothiazide (HYDRODIURIL) 25 MG tablet Take 1 tablet (25 mg total) by mouth daily. 90 tablet 3  ? Vitamin D, Ergocalciferol, (DRISDOL) 1.25 MG (50000 UNIT) CAPS capsule TAKE 1 CAPSULE BY MOUTH EVERY 7 DAYS (Patient not taking: Reported on 10/24/2021) 5 capsule 3  ? ? ? ?Physical Exam: ?Blood pressure 124/69, pulse 79, temperature 97.7 ?F (36.5 ?C), temperature source Oral, resp. rate 18, height 5' 5"  (1.651 m), weight 120.2 kg, last menstrual period 09/01/2020, SpO2 98 %. ?General: pleasant, WD, WN, obese white female who is  laying in bed in NAD ?HEENT: head is normocephalic, atraumatic.  Sclera are noninjected.  PERRL.  Ears and nose without any masses or lesions.  Mouth is pink and moist ?Heart: regular, rate, and rhythm.  Normal s1,s2. No obvious murmurs, gallops, or rubs noted.  Palpable radial and pedal pulses bilaterally ?Lungs: CTAB, no wheezes, rhonchi, or rales noted.  Respiratory effort nonlabored ?Abd: soft, tender in RUQ, ND, obese, +BS, no masses, hernias, or organomegaly ?MS: all 4 extremities are symmetrical with no cyanosis, clubbing, or edema. ?Skin: warm and dry with no masses, lesions, or rashes ?Neuro: Cranial nerves 2-12 grossly intact, sensation is normal throughout ?Psych: A&Ox3 with an appropriate affect. ? ? ?Results for orders placed or performed during  the hospital encounter of 10/24/21 (from the past 48 hour(s))  ?Basic metabolic panel     Status: Abnormal  ? Collection Time: 10/24/21  3:35 PM  ?Result Value Ref Range  ? Sodium 132 (L) 135 - 145 mmol/L  ? Potassium 3.5 3.5 - 5.1 mmol/L  ? Chloride 98 98 - 111 mmol/L  ? CO2 20 (L) 22 - 32 mmol/L  ? Glucose, Bld 104 (H) 70 - 99 mg/dL  ?  Comment: Glucose reference range applies only to samples taken after fasting for at least 8 hours.  ? BUN 23 (H) 6 - 20 mg/dL  ? Creatinine, Ser 0.76 0.44 - 1.00 mg/dL  ? Calcium 10.1 8.9 - 10.3 mg/dL  ? GFR, Estimated >60 >60 mL/min  ?  Comment: (NOTE) ?Calculated using the CKD-EPI Creatinine Equation (2021) ?  ? Anion gap 14 5 - 15  ?  Comment: Performed at [facility], [address] [place], [place] [address]  ?Troponin I (High Sensitivity)     Status: None  ? Collection Time: 10/24/21  3:35 PM  ?Result Value Ref Range  ? Troponin I (High Sensitivity) 3 <18 ng/L  ?  Comment: (NOTE) ?Elevated high sensitivity troponin I (hsTnI) values and significant  ?changes across serial measurements may suggest ACS but many other  ?chronic and acute conditions are known to elevate hsTnI results.  ?Refer to the  "Links" section for chest pain algorithms and additional  ?guidance. ?Performed at [facility], [address], ?[place], [place] [address] ?  ?CBC     Status: Abnormal  ? Collection Tim

## 2021-10-25 NOTE — Op Note (Signed)
10/25/2021 ? ?PATIENT:  Theresa Sawyer  41 y.o. female ? ?Patient Care Team: ?Horald Pollen, MD as PCP - General (Internal Medicine) ? ?PRE-OPERATIVE DIAGNOSIS:   ? ?Acute on Chronic Cholecystitis ? ?POST-OPERATIVE DIAGNOSIS:  ? ?Acute on Chronic Calculus Cholecystitis ?Fatty steatohepatitis ? ?PROCEDURE:  Core Liver Biopsy (CPT code 40086) & Laparoscopic cholecystectomy with intraoperative cholangiogram (CPT code [id number]) ? ?SURGEON:  Adin Hector, MD, FACS. ? ?ASSISTANT: OR Staff  ? ?ANESTHESIA:    ?General with endotracheal intubation ?Local anesthetic as a field block ? ?EBL:  (See Anesthesia Intraoperative Record) ?Total I/O ?In: 1100 [I.V.:1000; IV Piggyback:100] ?Out: 625 [Urine:600; Blood:25] ? ?Delay start of Pharmacological VTE agent (>24hrs) due to surgical blood loss or risk of bleeding:  no ? ?DRAINS: None  ? ?SPECIMEN: Gallbladder & Core liver biopsies   ? ?DISPOSITION OF SPECIMEN:  PATHOLOGY ? ?COUNTS:  YES ? ?PLAN OF CARE: Admit to inpatient  ? ?PATIENT DISPOSITION:  PACU - hemodynamically stable. ? ?INDICATION:   Pleasant woman with prior episodes of biliary colic with gallstones.  Had an attack rather intense with Percell Miller sign.  Admitted with antibiotics.  Recommendation made for laparoscopic cholecystectomy with probable cholangiogram.  Possible liver biopsy given concern of fatty change. ? ?The anatomy & physiology of hepatobiliary & pancreatic function was discussed.  The pathophysiology of gallbladder dysfunction was discussed.  Natural history risks without surgery was discussed.   I feel the risks of no intervention will lead to serious problems that outweigh the operative risks; therefore, I recommended cholecystectomy to remove the pathology.  I explained laparoscopic techniques with possible need for an open approach.  Probable cholangiogram to evaluate the bilary tract was explained as well.   ? ?Risks such as bleeding, infection, abscess, leak, injury to other organs, need for  further treatment, heart attack, death, and other risks were discussed.  I noted a good likelihood this will help address the problem.  Possibility that this will not correct all abdominal symptoms was explained.  Goals of post-operative recovery were discussed as well.  We will work to minimize complications.  An educational handout further explaining the pathology and treatment options was given as well.  Questions were answered.  The patient expresses understanding & wishes to proceed with surgery. ? ?OR FINDINGS: Chronic lesions and gallbladder wall changes consistent with chronic cholecystitis.  Edema and inflammation with phlegmon consistent with acute infection as well. ? ?Narrowed biliary system but otherwise classic anatomy.  No evidence of leak or choledocholithiasis or obstruction. ? ?Liver: Fatty steatohepatitis ? ?DESCRIPTION:  ? ?Informed consent was confirmed.  The patient underwent general anaesthesia without difficulty.  The patient was positioned appropriately.  VTE prevention in place.  The patient's abdomen was clipped, prepped, & draped in a sterile fashion.  Surgical timeout confirmed our plan. ? ?Peritoneal entry with a laparoscopic port was obtained using optical entry technique in the right upper abdomen as the patient was positioned in reverse Trendelenburg.  Entry was clean.  I induced carbon dioxide insufflation.  Camera inspection revealed no injury.  Extra ports were carefully placed under direct laparoscopic visualization. ? ?I turned attention to the right upper quadrant.  Obvious inflammatory omental adhesions covering up most of the gallbladder.  These were carefully freed off to better expose and confirm the gallbladder location.  The gallbladder fundus was elevated cephalad.  Upon entering the abdomen (organ space), I encountered a phlegmon involving the gallbladder .  I used hook cautery to free the peritoneal coverings  between the gallbladder and the liver on the posteriolateral  and anteriomedial walls.   I used careful blunt and hook dissection to help get a good critical view of the cystic artery and cystic duct. I did further dissection to free 50%of the gallbladder off the liver bed to get a good critical view of the infundibulum and cystic duct.  I skeletonized the cystic duct.  I dissected out the cystic artery; and, after getting a good 360? view, ligated the anterior & posterior branches of the cystic artery close on the infundibulum using clips & cautery. ? ?I placed a clip on the infundibulum. I did a partial cystic duct-otomy and ensured patency. I placed a 5 [place] cholangiocatheter through a puncture site at the right subcostal ridge of the abdominal wall and directed it into the cystic duct.  We ran a cholangiogram with dilute radio-opaque contrast and continuous fluoroscopy.  Flow was initially slow with little bit of delay but then lit up a narrow but typical biliary system   Contrast flowed from a side branch consistent with cystic duct cannulization. Contrast flowed up the common hepatic duct into the right and left intrahepatic chains out to secondary radicals. Contrast flowed down the common bile duct easily across the normal ampulla into the duodenum.  This was consistent with a normal cholangiogram. ? ?I removed the cholangiocatheter. I placed clips on the cystic duct x4.  I completed cystic duct transection. I freed the gallbladder from its remaining attachments to the liver.  Placed the gallbladder inside an [employer]Sac.  I ensured hemostasis on the gallbladder fossa of the liver and elsewhere. I inspected the rest of the abdomen & detected no injury nor bleeding elsewhere. ? ?I removed the gallbladder out the subxiphoid fascia port site.  I had opened up the fascia to 2 cm given gallbladder wall thickening.. I closed the subxiphoid fascia transversely using #1 PDS interrupted stitches. I closed the skin using 4-0 monocryl stitch.  Sterile dressings were applied. The  patient was extubated & arrived in the PACU in stable condition.. ? ?I had discussed postoperative care with the patient in the holding area. I discussed operative findings, updated the patient's status, discussed probable steps to recovery, and gave postoperative recommendations to the patient's significant other, Bettey Costa.  Recommendations were made.  Questions were answered.  He expressed understanding & appreciation. ? ?Adin Hector, M.D., F.A.C.S. ?Gastrointestinal and Minimally Invasive Surgery ?[facility] ?[address] ?[place], [place] [address] ?([phone] Main / Paging ? ?10/25/2021 ?5:28 PM ? ? ?

## 2021-10-25 NOTE — Hospital Course (Signed)
41 yo F with prediabetes, hypertension, psoriatic arthritis/anxiety/depression presented with sudden abdominal pain after eating dinner preceded by feeling unwell with intermittent right-sided back pain x 3 days ?Underwent further work-up in the ED and found to have acute cholecystits, stone in GB neck but doesn't seem to be Mirizzi syndrome (no hepatic duct dilation, lower duct is only 1-71m. And LFTs unimpressive.  ?Gen surg was consulted advised admission under hospitalist.  Patient had leukocytosis 16.2 on admission showed fairly stable LFTs on the ALT at 65 and stable electrolytes negative troponins x2, MRSA screen negative. ?

## 2021-10-25 NOTE — H&P (View-Only) (Signed)
? ? ? ?Myrtie Hawk ?07/19/1980  ?[id number].   ? ?Requesting MD: Dr. Antonieta Pert ?Chief Complaint/Reason for Consult: cholecystitis ? ?HPI:  ?This is a pleasant 41 yo white female with a history of HTN, GERD, prediabetes, HLD, psoriatic arthritis (not on immunomodulators currently), and OSA, who has been having intermittent nausea and RUQ abdominal pain for the last month.  She had mentioned this to her pcp but was felt to be msk related as it was more painful with movements at times.  However, yesterday her nausea increased and she had some episodes of emesis.  She began having increasing RUQ abdominal pain that radiated to her back, chest pain, and hot sweats.  She denies any fevers or chills.  Nothing seemed to help her pain.  She presented to an outside ED where she underwent evaluation with an [place] that reveals a 41m stone lodged in the neck of the gallbladder.  Her WBC was 16.2, down to 12.9 today.  Her LFTs are normal except her ALT is slightly elevated at 62.  She has been admitted and we have been asked to see her for further evaluation. ? ?ROS: ?ROS: Please see HPI, otherwise all other systems have been reviewed and are negative. ? ?Family History  ?Problem Relation Age of Onset  ? Diabetes Maternal Grandmother   ? Diabetes Paternal Grandmother   ? Anxiety disorder Mother   ? Psoriasis Sister   ? Heart attack Brother 371 ?     deceased from this  ? Anxiety disorder Brother   ? Cirrhosis Maternal Grandfather   ?     alcohol related  ? Allergy (severe) Son   ? Healthy Son   ? Colon cancer Neg Hx   ? Esophageal cancer Neg Hx   ? Colon polyps Neg Hx   ? Stomach cancer Neg Hx   ? Pancreatic cancer Neg Hx   ? ? ?Past Medical History:  ?Diagnosis Date  ? Abnormal uterine bleeding (AUB)   ? Anxiety   ? Arthritis   ? oa  ? Depression   ? GERD (gastroesophageal reflux disease)   ? History of chest pain (10-20-2019  pt denies any cardiac s&s)  ? mutliple ED visits, atypical chest pain, chest wall pain, muscularskeletal  chest pain;  pt had cardiology evaulation dated 04-23-2018 note in epic , state atypical chest wall pain with negative d dimer and troponin's multiple times in epic, suggested CTA  (pt did not get done)  ? Hyperlipidemia   ? Hypertension   ? IDA (iron deficiency anemia)   ? none recently  ? Menorrhagia   ? Migraine   ? OSA on CPAP   ? followed by dr ather cpap set on 7 to 14  ? Psoriasis   ? Urinary frequency   ? ? ?Past Surgical History:  ?Procedure Laterality Date  ? DILATATION & CURETTAGE/HYSTEROSCOPY WITH MYOSURE N/A 10/24/2019  ? Procedure: DILATATION & CURETTAGE/HYSTEROSCOPY WITH MYOSURE;  Surgeon: KJoseph Pierini MD;  [facility]  Service: Gynecology;  Laterality: N/A;  ? HYSTEROSCOPY WITH NOVASURE N/A 04/06/2020  ? Procedure: DILATION AND CURRETTAGE; NOVASURE ABLATION;  Surgeon: KJoseph Pierini MD;  [facility]  Service: Gynecology;  Laterality: N/A;  ? LAPAROSCOPY N/A 04/06/2020  ? Procedure: LAPAROSCOPY OPERATIVE; LYSIS OF ADHESION;  Surgeon: KJoseph Pierini MD;  [facility]  Service: Gynecology;  Laterality: N/A;  ? ROBOTIC ASSISTED TOTAL HYSTERECTOMY WITH BILATERAL SALPINGO OOPHERECTOMY Bilateral 09/28/2020  ? Procedure:  XI ROBOTIC ASSISTED TOTAL LAPAROSCOPIC HYSTERECTOMY WITH BILATERAL SALPINGO OOPHORECTOMY;  Surgeon: Princess Bruins, MD;  Location: [facility];  Service: Gynecology;  Laterality: Bilateral;  request 8:30am OR start time in [place] held time for Dr. Dellis Filbert ?requests 2 hours ?KATHY CONFIRMED ON 2/10 W/NICK FOR  TRACY (RNFA) TO ASSIST  ? TUBAL LIGATION Bilateral 02/05/2013  ? Procedure: POST PARTUM TUBAL LIGATION;  Surgeon: Woodroe Mode, MD;  Location: [facility];  Service: Gynecology;  Laterality: Bilateral;    Filshie clips  ? ? ?Social History:  reports that she has been smoking cigarettes. She has a 10.00 pack-year smoking history. She has never used smokeless tobacco. She reports that she  does not drink alcohol and does not use drugs. ? ?Allergies: No Known Allergies ? ?Medications Prior to Admission  ?Medication Sig Dispense Refill  ? escitalopram (LEXAPRO) 20 MG tablet TAKE 1 TABLET(20 MG) BY MOUTH DAILY (Patient taking differently: Take 20 mg by mouth daily.) 90 tablet 3  ? estradiol (VIVELLE-DOT) 0.1 MG/24HR patch Place 1 patch (0.1 mg total) onto the skin 2 (two) times a week. (Patient taking differently: Place 1 patch onto the skin 2 (two) times a week. Sunday and Wednesday) 24 patch 3  ? famotidine (PEPCID) 40 MG tablet TAKE 1 TABLET(40 MG) BY MOUTH TWICE DAILY (Patient taking differently: Take 40 mg by mouth 2 (two) times daily.) 60 tablet 3  ? metFORMIN (GLUCOPHAGE) 500 MG tablet Take 1 tablet (500 mg total) by mouth 2 (two) times daily with a meal. 180 tablet 3  ? oxyCODONE-acetaminophen (PERCOCET/ROXICET) 5-325 MG tablet Take 1 tablet by mouth every 4 (four) hours as needed for moderate pain.    ? pregabalin (LYRICA) 25 MG capsule Take 25 mg by mouth 2 (two) times daily.    ? rosuvastatin (CRESTOR) 10 MG tablet Take 1 tablet (10 mg total) by mouth daily. 90 tablet 3  ? valsartan (DIOVAN) 80 MG tablet TAKE 1 AND 1/2 TABLETS(120 MG) BY MOUTH DAILY (Patient taking differently: Take 120 mg by mouth daily.) 90 tablet 3  ? ALPRAZolam (XANAX) 0.5 MG tablet TAKE 1 TABLET BY MOUTH EVERY DAY AS NEEDED FOR ANXIETY (Patient not taking: Reported on 10/24/2021) 30 tablet 1  ? hydrochlorothiazide (HYDRODIURIL) 25 MG tablet Take 1 tablet (25 mg total) by mouth daily. 90 tablet 3  ? Vitamin D, Ergocalciferol, (DRISDOL) 1.25 MG (50000 UNIT) CAPS capsule TAKE 1 CAPSULE BY MOUTH EVERY 7 DAYS (Patient not taking: Reported on 10/24/2021) 5 capsule 3  ? ? ? ?Physical Exam: ?Blood pressure 124/69, pulse 79, temperature 97.7 ?F (36.5 ?C), temperature source Oral, resp. rate 18, height 5' 5"  (1.651 m), weight 120.2 kg, last menstrual period 09/01/2020, SpO2 98 %. ?General: pleasant, WD, WN, obese white female who is  laying in bed in NAD ?HEENT: head is normocephalic, atraumatic.  Sclera are noninjected.  PERRL.  Ears and nose without any masses or lesions.  Mouth is pink and moist ?Heart: regular, rate, and rhythm.  Normal s1,s2. No obvious murmurs, gallops, or rubs noted.  Palpable radial and pedal pulses bilaterally ?Lungs: CTAB, no wheezes, rhonchi, or rales noted.  Respiratory effort nonlabored ?Abd: soft, tender in RUQ, ND, obese, +BS, no masses, hernias, or organomegaly ?MS: all 4 extremities are symmetrical with no cyanosis, clubbing, or edema. ?Skin: warm and dry with no masses, lesions, or rashes ?Neuro: Cranial nerves 2-12 grossly intact, sensation is normal throughout ?Psych: A&Ox3 with an appropriate affect. ? ? ?Results for orders placed or performed during  the hospital encounter of 10/24/21 (from the past 48 hour(s))  ?Basic metabolic panel     Status: Abnormal  ? Collection Time: 10/24/21  3:35 PM  ?Result Value Ref Range  ? Sodium 132 (L) 135 - 145 mmol/L  ? Potassium 3.5 3.5 - 5.1 mmol/L  ? Chloride 98 98 - 111 mmol/L  ? CO2 20 (L) 22 - 32 mmol/L  ? Glucose, Bld 104 (H) 70 - 99 mg/dL  ?  Comment: Glucose reference range applies only to samples taken after fasting for at least 8 hours.  ? BUN 23 (H) 6 - 20 mg/dL  ? Creatinine, Ser 0.76 0.44 - 1.00 mg/dL  ? Calcium 10.1 8.9 - 10.3 mg/dL  ? GFR, Estimated >60 >60 mL/min  ?  Comment: (NOTE) ?Calculated using the CKD-EPI Creatinine Equation (2021) ?  ? Anion gap 14 5 - 15  ?  Comment: Performed at [facility], [address], [place], [place] [address]  ?Troponin I (High Sensitivity)     Status: None  ? Collection Time: 10/24/21  3:35 PM  ?Result Value Ref Range  ? Troponin I (High Sensitivity) 3 <18 ng/L  ?  Comment: (NOTE) ?Elevated high sensitivity troponin I (hsTnI) values and significant  ?changes across serial measurements may suggest ACS but many other  ?chronic and acute conditions are known to elevate hsTnI results.  ?Refer to the  "Links" section for chest pain algorithms and additional  ?guidance. ?Performed at [facility], [address] ?[place], [place] [address] ?  ?CBC     Status: Abnormal  ? Collection Tim

## 2021-10-26 ENCOUNTER — Encounter (HOSPITAL_COMMUNITY): Payer: Self-pay | Admitting: Surgery

## 2021-10-26 LAB — CBC
HCT: 42.1 % (ref 36.0–46.0)
Hemoglobin: 13.7 g/dL (ref 12.0–15.0)
MCH: 30.8 pg (ref 26.0–34.0)
MCHC: 32.5 g/dL (ref 30.0–36.0)
MCV: 94.6 fL (ref 80.0–100.0)
Platelets: 334 10*3/uL (ref 150–400)
RBC: 4.45 MIL/uL (ref 3.87–5.11)
RDW: 13.3 % (ref 11.5–15.5)
WBC: 20.1 10*3/uL — ABNORMAL HIGH (ref 4.0–10.5)
nRBC: 0 % (ref 0.0–0.2)

## 2021-10-26 LAB — COMPREHENSIVE METABOLIC PANEL
ALT: 99 U/L — ABNORMAL HIGH (ref 0–44)
AST: 79 U/L — ABNORMAL HIGH (ref 15–41)
Albumin: 3.7 g/dL (ref 3.5–5.0)
Alkaline Phosphatase: 66 U/L (ref 38–126)
Anion gap: 8 (ref 5–15)
BUN: 11 mg/dL (ref 6–20)
CO2: 23 mmol/L (ref 22–32)
Calcium: 8.5 mg/dL — ABNORMAL LOW (ref 8.9–10.3)
Chloride: 104 mmol/L (ref 98–111)
Creatinine, Ser: 0.73 mg/dL (ref 0.44–1.00)
GFR, Estimated: >60 mL/min (ref >60)
Glucose, Bld: 131 mg/dL — ABNORMAL HIGH (ref 70–99)
Potassium: 4.1 mmol/L (ref 3.5–5.1)
Sodium: 135 mmol/L (ref 135–145)
Total Bilirubin: 0.8 mg/dL (ref 0.3–1.2)
Total Protein: 6.9 g/dL (ref 6.5–8.1)

## 2021-10-26 MED ORDER — ACETAMINOPHEN 500 MG PO TABS: 1000.0000 mg | ORAL_TABLET | Freq: Four times a day (QID) | ORAL | Status: DC | PRN

## 2021-10-26 MED ORDER — TRAMADOL HCL 50 MG PO TABS: 50.0000 mg | ORAL_TABLET | Freq: Four times a day (QID) | ORAL | 0 refills | Status: DC | PRN

## 2021-10-26 NOTE — Anesthesia Postprocedure Evaluation (Signed)
Anesthesia Post Note ? ?Patient: Theresa Sawyer ? ?Procedure(s) Performed: LAPAROSCOPIC CHOLECYSTECTOMY with INTRAOPERATIVE CHOLANGIOGRAM ? ?  ? ?Patient location during evaluation: PACU ?Anesthesia Type: General ?Level of consciousness: awake ?Pain management: pain level controlled ?Vital Signs Assessment: post-procedure vital signs reviewed and stable ?Respiratory status: spontaneous breathing, nonlabored ventilation, respiratory function stable and patient connected to nasal cannula oxygen ?Cardiovascular status: blood pressure returned to baseline and stable ?Postop Assessment: no apparent nausea or vomiting ?Anesthetic complications: no ? ? ?No notable events documented. ? ?Last Vitals:  ?Vitals:  ? 10/25/21 2229 10/26/21 0204  ?BP:  115/65  ?Pulse: 98 77  ?Resp: 17 18  ?Temp:  36.7 ?C  ?SpO2: 94% 95%  ?  ?Last Pain:  ?Vitals:  ? 10/26/21 0204  ?TempSrc: Oral  ?PainSc:   ? ? ?  ?  ?  ?  ?  ?  ? ?Marcella Dunnaway P Rahsaan Weakland ? ? ? ? ?

## 2021-10-26 NOTE — Discharge Summary (Signed)
? ? ?Patient ID: ?Theresa Sawyer ?[id number] ?[birth year] 41 y.o. ? ?Admit date: 10/24/2021 ?Discharge date: 10/26/2021 ? ?Admitting Diagnosis: ?Cholecystitis ?Psoriatic arthritis ?HTN ? ?Discharge Diagnosis ?Patient Active Problem List  ? Diagnosis Date Noted  ? Acute cholecystitis 10/24/2021  ? Dyslipidemia 10/30/2020  ? Prediabetes 10/30/2020  ? Postoperative state 09/28/2020  ? Essential hypertension 08/04/2020  ? Vitamin D deficiency 08/04/2020  ? Gastroesophageal reflux disease without esophagitis 08/04/2020  ? Migraine without aura and without status migrainosus, not intractable 08/04/2020  ? Primary osteoarthritis involving multiple joints 03/18/2020  ? Dysfunctional uterine bleeding 03/18/2020  ? Generalized anxiety disorder 03/18/2020  ? Psoriatic arthritis ([place]) 01/24/2020  ? Primary osteoarthritis of both knees 01/24/2020  ? History of hyperlipidemia 01/24/2020  ? History of iron deficiency anemia 01/24/2020  ? BMI 40.0-44.9, adult ([place]) 01/24/2020  ? OSA on CPAP 04/24/2019  ? Psoriasis 12/05/2018  ? Situational anxiety 12/05/2018  ? Intermittent hypertension 12/05/2018  ?S/p lap chole ? ?Consultants ?General surgery ?Hospitalist ? ?Reason for Admission: ?41 year old female history of prediabetes, hypertension, psoriatic arthritis, depression, anxiety, presents to the ER with sudden onset of abdominal pain started after she started eating dinner.  Patient states that she has been feeling unwell for last 3 days has had intermittent right-sided back pain.  No nausea at that time.  She attributed her back pain to her chronic aches and pains from her psoriatic arthritis.  No fever.  No chills.  No diarrhea. ?  ?Onset of abdominal pain was sudden.  She presented to the ER after she started having lower lower chest pain. ?  ?On arrival to the ER, temp 97.4 heart rate 99 blood pressure 117/71 ?  ?Labs showed a white count of 16.2, hemoglobin 15, platelets of 182 ?  ?Lipase is 16 ?Sodium 132, potassium 3.5, BUN 23,  creatinine 0.7, glucose of 104 ?  ?Abdominal ultrasound demonstrated 19 mm calculus seen impacted in the gallbladder neck.  There is a small amount of layering sludge in the gallbladder.  Gallbladder was mildly distended.  Positive sonographic Murphy sign.  There was no pericholecystic fluid. ?  ?EDP discussed case with general surgery and [facility] hospitalist. ?  ?Patient transferred to [facility]. ?  ?Dr. Marlou Starks with general surgery was notified of patient's arrival.  ? ?Procedures ?Lap chole, Dr. Johney Maine 4/18 ? ?Hospital Course:  ?The patient was admitted and underwent a laparoscopic cholecystectomy.  The patient tolerated the procedure well.  On POD 1, the patient was tolerating a regular diet, voiding well, mobilizing, and pain was controlled with oral pain medications.  The patient was stable for DC home at this time with appropriate follow up made. ? ?Other medical issues remained stable and were managed by the medical service ? ?Physical Exam: ?Abd: soft, appropriately tender, +BS, ND, incisions c/d/I with gauze and tegaderm in place ? ?Allergies as of 10/26/2021   ?No Known Allergies ?  ? ?  ?Medication List  ?  ? ?STOP taking these medications   ? ?oxyCODONE-acetaminophen 5-325 MG tablet ?Commonly known as: PERCOCET/ROXICET ?  ? ?  ? ?TAKE these medications   ? ?acetaminophen 500 MG tablet ?Commonly known as: TYLENOL ?Take 2 tablets (1,000 mg total) by mouth every 6 (six) hours as needed for mild pain (or Fever >/= 101). ?  ?ALPRAZolam 0.5 MG tablet ?Commonly known as: Duanne Moron ?TAKE 1 TABLET BY MOUTH EVERY DAY AS NEEDED FOR ANXIETY ?  ?escitalopram 20 MG tablet ?Commonly known as: LEXAPRO ?TAKE 1 TABLET(20 MG) BY MOUTH  DAILY ?What changed: See the new instructions. ?  ?estradiol 0.1 MG/24HR patch ?Commonly known as: VIVELLE-DOT ?Place 1 patch (0.1 mg total) onto the skin 2 (two) times a week. ?What changed: additional instructions ?  ?famotidine 40 MG tablet ?Commonly known as: PEPCID ?TAKE 1 TABLET(40 MG) BY MOUTH  TWICE DAILY ?What changed: See the new instructions. ?  ?hydrochlorothiazide 25 MG tablet ?Commonly known as: HYDRODIURIL ?Take 1 tablet (25 mg total) by mouth daily. ?  ?metFORMIN 500 MG tablet ?Commonly known as: GLUCOPHAGE ?Take 1 tablet (500 mg total) by mouth 2 (two) times daily with a meal. ?  ?pregabalin 25 MG capsule ?Commonly known as: LYRICA ?Take 25 mg by mouth 2 (two) times daily. ?  ?rosuvastatin 10 MG tablet ?Commonly known as: Crestor ?Take 1 tablet (10 mg total) by mouth daily. ?  ?traMADol 50 MG tablet ?Commonly known as: ULTRAM ?Take 1-2 tablets (50-100 mg total) by mouth every 6 (six) hours as needed for moderate pain or severe pain. ?  ?valsartan 80 MG tablet ?Commonly known as: DIOVAN ?TAKE 1 AND 1/2 TABLETS(120 MG) BY MOUTH DAILY ?What changed: See the new instructions. ?  ?Vitamin D (Ergocalciferol) 1.25 MG (50000 UNIT) Caps capsule ?Commonly known as: DRISDOL ?TAKE 1 CAPSULE BY MOUTH EVERY 7 DAYS ?  ? ?  ? ? ? ? Follow-up Information   ? ? [facility], [place] Follow up on 11/15/2021.   ?Specialty: General Surgery ?Why: 10:15am, arrive by 9:45am for paperwork and check in process.  Please bring insurance card and photo ID. ?Contact information: ?[address] ?[address] ?[place] [address] ?[phone] ? ? ?  ?  ? ? Horald Pollen, MD Follow up.   ?Specialty: Internal Medicine ?Why: As needed ?Contact information: ?[address] ?[place] [address] ?[phone] ? ? ?  ?  ? ?  ?  ? ?  ? ? ?Signed: ?Saverio Danker, PA-C ?[facility] ?10/26/2021, 10:08 AM ?Please see Amion for pager number during day hours 7:00am-4:30pm, 7-11:30am on Weekends ? ? ?

## 2021-10-26 NOTE — Progress Notes (Signed)
?PROGRESS NOTE ?Theresa Sawyer  FFM:[id number] DOB: 23-Apr-1981 DOA: 10/24/2021 ?PCP: Horald Pollen, MD  ? ?Brief Narrative/Hospital Course: ?41 yo F with prediabetes, hypertension, psoriatic arthritis/anxiety/depression presented with sudden abdominal pain after eating dinner preceded by feeling unwell with intermittent right-sided back pain x 3 days ?Underwent further work-up in the ED and found to have acute cholecystits, stone in GB neck but doesn't seem to be Mirizzi syndrome (no hepatic duct dilation, lower duct is only 1-56m. And LFTs unimpressive.  ?Gen surg was consulted advised admission under hospitalist.  Patient had leukocytosis 16.2 on admission showed fairly stable LFTs on the ALT at 65 and stable electrolytes negative troponins x2, MRSA screen negative. ?  ?  ?Subjective: ?Seen this morning doing well.  Has no new complaints.  Tolerating diet.   ? ?Assessment and Plan: ?Principal Problem: ?  Acute cholecystitis ?Active Problems: ?  Psoriasis ?  OSA on CPAP ?  [place] 40.0-44.9, [place] ?  Generalized anxiety disorder ?  Essential hypertension ?  Gastroesophageal reflux disease without esophagitis ? ?Acute on chronic calculus cholecystitis  ?Fatty steatohepatitis  ?Impacted gallstone in gallbladder neck: ?Underwent laparoscopic cholecystectomy with Intra-Op cholangiogram, core liver biopsy.  Tolerating diet this morning.  Pain is controlled.  Plan is for discharge home today with follow-up outpatient.  ? ?Leukocytosis: Was downtrending however bump noted postop overnight likely reactive.  Afebrile.  Advised her to see PCP to check CBC in a week. ?GERD-continue PPI ?Essential hypertension BP stable cont home HCTZ ?GAD on as needed Xanax ?Chronic psoriasis stable-endorses recent flareup in the setting of stress, I have instructed her to follow-[facility] with her dermatologist [facility]  Minimize narcotic use. ? ?Morbid obesity with OTTS:[id number] mass index is 44.1 kg/m?. : Will benefit  with PCP follow-up, weight loss  healthy lifestyle. Cont CPAP qhs ? ? ?DVT prophylaxis: enoxaparin (LOVENOX) injection 40 mg Start: 10/26/21 1400 ?SCDs Start: 10/24/21 2241 ?Code Status:   Code Status: Full Code ?Family Communication: plan of care discussed with patient at bedside. ?Patient status is: Inpatient level of care: Med-Surg  ?Remains inpatient because: Postop care, [facility]  team plans for DC today ?Patient currently per stable ? ?Dispo: The patient is from: Home ?           Anticipated disposition: Home ? ?Mobility Assessment (last 72 hours)   ? ? Mobility Assessment   ? ? RNeibertName 10/25/21 1936 10/24/21 2210  ?  ?  ?  ? Does patient have an order for bedrest or is patient medically unstable No - Continue assessment No - Continue assessment     ? What is the highest level of mobility based on the progressive mobility assessment? Level 5 (Walks with assist in room/hall) - Balance while stepping forward/back and can walk in room with assist - Complete Level 6 (Walks independently in room and hall) - Balance while walking in room without assist - Complete     ? ?  ?  ? ?  ?  ? ?Objective: ?Vitals last 24 hrs: ?Vitals:  ? 10/25/21 2229 10/26/21 0009204/19/23 0526 10/26/21 0855  ?BP:  115/65 130/83 105/66  ?Pulse: 98 77 71 74  ?Resp: 17 18 18 15   ?Temp:  98 ?F (36.7 ?C) 97.8 ?F (36.6 ?C)   ?TempSrc:  Oral Oral   ?SpO2: 94% 95% 95% 92%  ?Weight:      ?Height:      ? ?Weight change:  ? ?Physical Examination: ?General exam: AA,obes, older than  stated age, weak appearing. ?HEENT:Oral mucosa moist, Ear/Nose WNL grossly, dentition normal. ?Respiratory system: bilaterally diminished,no use of accessory muscle ?Cardiovascular system: S1 & S2 +, No JVD,. ?Gastrointestinal system: Abdomen soft, mild tenderness surgical site clean dry  ?Nervous System:Alert, awake, moving extremities and grossly nonfocal ?Extremities: edema neg,distal peripheral pulses palpable.  ?Skin: No rashes,no icterus. ?MSK: Normal muscle bulk,tone,  power ? ? ?Medications reviewed:  ?Scheduled Meds: ? enoxaparin (LOVENOX) injection  40 mg Subcutaneous Q24H  ? escitalopram  20 mg Oral Daily  ? famotidine  40 mg Oral BID  ? hydrocortisone cream  1 application. Topical BID  ? irbesartan  150 mg Oral Daily  ? polycarbophil  625 mg Oral BID  ? pregabalin  25 mg Oral BID  ? sodium chloride flush  3 mL Intravenous Q12H  ? ?Continuous Infusions: ? sodium chloride    ? cefTRIAXone (ROCEPHIN)  IV 2 g (10/25/21 2101)  ? lactated ringers    ? methocarbamol (ROBAXIN) IV 1,000 mg (10/26/21 0525)  ? metronidazole 500 mg (10/25/21 2208)  ? ondansetron (ZOFRAN) IV    ? ? ?  ?Diet Order   ? ?       ?  Diet Heart Room service appropriate? Yes; Fluid consistency: Thin  Diet effective now       ?  ? ?  ?  ? ?  ?  ? ?  ?  ?  ? ? ?Intake/Output Summary (Last 24 hours) at 10/26/2021 1005 ?Last data filed at 10/26/2021 0525 ?Gross per 24 hour  ?Intake 2560 ml  ?Output 1675 ml  ?Net 885 ml  ? ? ?Net IO Since Admission: 769.47 mL [10/26/21 1005]  ?Wt Readings from Last 3 Encounters:  ?2021-10-26 120.2 kg  ?10/28/20 116.5 kg  ?10/26/20 117.8 kg  ?  ? ?Unresulted Labs (From admission, onward)  ? ? None  ? ?  ?Data Reviewed: I have personally reviewed following labs and imaging studies ?CBC: ?Recent Labs  ?Lab 2021-10-26 ?1903 10/25/21 ?0422 10/26/21 ?5631  ?WBC 16.2* 12.9* 20.1*  ?NEUTROABS  --  6.9  --   ?HGB 15.1* 15.0 13.7  ?HCT 45.0 44.3 42.1  ?MCV 89.1 92.3 94.6  ?PLT 182 336 334  ? ? ?Basic Metabolic Panel: ?Recent Labs  ?Lab October 26, 2021 ?1535 10-26-2021 ?1903 10/25/21 ?0422 10/26/21 ?0444  ?NA 132* 134* 137 135  ?K 3.5 3.7 3.9 4.1  ?CL 98 98 105 104  ?CO2 20* 24 25 23   ?GLUCOSE 104* 94 100* 131*  ?BUN 23* 20 18 11   ?CREATININE 0.76 0.68 0.44 0.73  ?CALCIUM 10.1 10.2 8.9 8.5*  ? ? ?GFR: ?Estimated Creatinine Clearance: 119 mL/min (by C-G formula based on SCr of 0.73 mg/dL). ?Liver Function Tests: ?Recent Labs  ?Lab 10/26/21 ?1903 10/25/21 ?0422 10/26/21 ?4970  ?AST 37 40 79*  ?ALT 65* 62* 99*   ?ALKPHOS 74 65 66  ?BILITOT 0.7 0.6 0.8  ?PROT 8.0 7.4 6.9  ?ALBUMIN 4.9 4.1 3.7  ? ? ?Recent Labs  ?Lab October 26, 2021 ?1903  ?LIPASE 16  ? ? ?No results for input(s): AMMONIA in the last 168 hours. ?Coagulation Profile: ?No results for input(s): INR, PROTIME in the last 168 hours. ?BNP (last 3 results) ?No results for input(s): PROBNP in the last 8760 hours. ?HbA1C: ?No results for input(s): HGBA1C in the last 72 hours. ?CBG: ?Recent Labs  ?Lab 10/25/21 ?1341  ?GLUCAP 81  ? ?Lipid Profile: ?No results for input(s): CHOL, HDL, LDLCALC, TRIG, CHOLHDL, LDLDIRECT in the last 72 hours. ?Thyroid  Function Tests: ?No results for input(s): TSH, T4TOTAL, FREET4, T3FREE, THYROIDAB in the last 72 hours. ?Sepsis Labs: ?No results for input(s): PROCALCITON, LATICACIDVEN in the last 168 hours. ? ?Recent Results (from the past 240 hour(s))  ?Surgical PCR screen     Status: None  ? Collection Time: 10/25/21  5:31 AM  ? Specimen: Nasal Mucosa; Nasal Swab  ?Result Value Ref Range Status  ? MRSA, PCR NEGATIVE NEGATIVE Final  ? Staphylococcus aureus NEGATIVE NEGATIVE Final  ?  Comment: (NOTE) ?The Xpert SA Assay (FDA approved for NASAL specimens in patients 8 ?years of age and older), is one component of a comprehensive ?surveillance program. It is not intended to diagnose infection nor to ?guide or monitor treatment. ?Performed at [facility], [address] ?[place], [place] [address] ?  ? ?  ?Antimicrobials: ?Anti-infectives (From admission, onward)  ? ? Start     Dose/Rate Route Frequency Ordered Stop  ? 10/25/21 2000  cefTRIAXone (ROCEPHIN) 2 g in sodium chloride 0.9 % 100 mL IVPB       ? 2 g ?200 mL/hr over 30 Minutes Intravenous Every 24 hours 10/24/21 2240    ? 10/25/21 1000  metroNIDAZOLE (FLAGYL) IVPB 500 mg       ? 500 mg ?100 mL/hr over 60 Minutes Intravenous Every 12 hours 10/24/21 2240    ? 10/24/21 2015  cefTRIAXone (ROCEPHIN) 2 g in sodium chloride 0.9 % 100 mL IVPB       ? 2 g ?200 mL/hr over 30  Minutes Intravenous  Once 10/24/21 2002 10/24/21 2059  ? 10/24/21 2015  metroNIDAZOLE (FLAGYL) IVPB 500 mg       ? 500 mg ?100 mL/hr over 60 Minutes Intravenous  Once 10/24/21 2002 10/24/21 2310  ? ?  ? ?[place]

## 2021-10-28 LAB — SURGICAL PATHOLOGY

## 2021-12-02 ENCOUNTER — Ambulatory Visit: Payer: Self-pay

## 2021-12-02 ENCOUNTER — Ambulatory Visit: Payer: BC Managed Care – PPO | Admitting: Orthopaedic Surgery

## 2021-12-02 ENCOUNTER — Encounter: Payer: Self-pay | Admitting: Orthopaedic Surgery

## 2021-12-02 ENCOUNTER — Ambulatory Visit (INDEPENDENT_AMBULATORY_CARE_PROVIDER_SITE_OTHER): Payer: BC Managed Care – PPO

## 2021-12-02 ENCOUNTER — Telehealth: Payer: Self-pay

## 2021-12-02 DIAGNOSIS — M1711 Unilateral primary osteoarthritis, right knee: Secondary | ICD-10-CM

## 2021-12-02 DIAGNOSIS — M1712 Unilateral primary osteoarthritis, left knee: Secondary | ICD-10-CM | POA: Diagnosis not present

## 2021-12-02 DIAGNOSIS — M170 Bilateral primary osteoarthritis of knee: Secondary | ICD-10-CM | POA: Diagnosis not present

## 2021-12-02 NOTE — Telephone Encounter (Signed)
Please precert for bilateral visco injections. Dr.Xu's patient. Thanks!

## 2021-12-02 NOTE — Progress Notes (Unsigned)
Office Visit Note   Patient: Theresa Sawyer           Date of Birth: 1980/09/08           MRN: [id number] Visit Date: 12/02/2021              Requested by: Horald Pollen, [place],  [place] [address] PCP: Horald Pollen, MD   Assessment & Plan: Visit Diagnoses:  1. Bilateral primary osteoarthritis of knee     Plan: Impression is bilateral knee osteoarthritis.  Today, we discussed repeat cortisone injection versus viscosupplementation injection.  Even though the previous cortisone injection did not provide long-lasting relief, she would like to repeat this today.  We will also submit for approval for viscosupplementation injection.  Follow-up once approved.  Call with concerns or questions.  Follow-Up Instructions: Return for once approved for bilateral visco inj.   Orders:  Orders Placed This Encounter  Procedures   XR KNEE 3 VIEW RIGHT   XR KNEE 3 VIEW LEFT   No orders of the defined types were placed in this encounter.     Procedures: Large Joint Inj: bilateral knee on 12/02/2021 10:39 AM Indications: pain Details: 22 G needle, anterolateral approach Medications (Right): 0.66 mL bupivacaine 0.25 %; 3 mL lidocaine 1 %; 13.33 mg methylPREDNISolone acetate 40 MG/ML Medications (Left): 0.66 mL bupivacaine 0.25 %; 3 mL lidocaine 1 %; 13.33 mg methylPREDNISolone acetate 40 MG/ML     Clinical Data: No additional findings.   Subjective: Chief Complaint  Patient presents with   Left Knee - Pain   Right Knee - Pain    HPI patient is a pleasant 41 year old female who comes in today with bilateral knee pain right greater than left for the past few years.  She denies any injury or change in activity but does note her symptoms have progressively worsened.  She does have a history of underlying psoriatic arthritis and has previously been on [place].  Not currently on medication for this.  The pain she is having is primarily to the anterior knee.   This is constant but worse going from seated to standing position as well as with stair climbing.  She does note occasional locking catching.  She is seen by pain management where she has previously been on oxycodone and tramadol.  She has had cortisone injections in the past which provided about 2-1/2 weeks relief of symptoms.  No previous viscosupplementation injection.  Review of Systems as detailed in HPI.  All others reviewed and are negative.   Objective: Vital Signs: LMP 09/01/2020   Physical Exam well-developed well-nourished female no acute distress.  Alert and oriented x3.  Ortho Exam bilateral knee exam shows no effusion.  Range of motion 0 to 120 degrees.  Medial joint line tenderness.  Moderate patellofemoral crepitus.  Ligaments are stable.  She is neurovascular tact distally.  Specialty Comments:  No specialty comments available.  Imaging: No results found.   PMFS History: Patient Active Problem List   Diagnosis Date Noted   Acute cholecystitis 10/24/2021   Dyslipidemia 10/30/2020   Prediabetes 10/30/2020   Postoperative state 09/28/2020   Essential hypertension 08/04/2020   Vitamin D deficiency 08/04/2020   Gastroesophageal reflux disease without esophagitis 08/04/2020   Migraine without aura and without status migrainosus, not intractable 08/04/2020   Primary osteoarthritis involving multiple joints 03/18/2020   Dysfunctional uterine bleeding 03/18/2020   Generalized anxiety disorder 03/18/2020   Psoriatic arthritis ([place]) 01/24/2020   Primary  osteoarthritis of both knees 01/24/2020   History of hyperlipidemia 01/24/2020   History of iron deficiency anemia 01/24/2020   BMI 40.0-44.9, adult ([place]) 01/24/2020   OSA on CPAP 04/24/2019   Psoriasis 12/05/2018   Situational anxiety 12/05/2018   Intermittent hypertension 12/05/2018   Past Medical History:  Diagnosis Date   Abnormal uterine bleeding (AUB)    Anxiety    Arthritis    oa   Depression    GERD  (gastroesophageal reflux disease)    History of chest pain (10-20-2019  pt denies any cardiac s&s)   mutliple ED visits, atypical chest pain, chest wall pain, muscularskeletal chest pain;  pt had cardiology evaulation dated 04-23-2018 note in epic , state atypical chest wall pain with negative d dimer and troponin's multiple times in epic, suggested CTA  (pt did not get done)   Hyperlipidemia    Hypertension    IDA (iron deficiency anemia)    none recently   Menorrhagia    Migraine    OSA on CPAP    followed by dr ather cpap set on 7 to 14   Psoriasis    Urinary frequency     Family History  Problem Relation Age of Onset   Diabetes Maternal Grandmother    Diabetes Paternal Grandmother    Anxiety disorder Mother    Psoriasis Sister    Heart attack Brother 22       deceased from this   Anxiety disorder Brother    Cirrhosis Maternal Grandfather        alcohol related   Allergy (severe) Son    Healthy Son    Colon cancer Neg Hx    Esophageal cancer Neg Hx    Colon polyps Neg Hx    Stomach cancer Neg Hx    Pancreatic cancer Neg Hx     Past Surgical History:  Procedure Laterality Date   CHOLECYSTECTOMY N/A 10/25/2021   Procedure: LAPAROSCOPIC CHOLECYSTECTOMY with INTRAOPERATIVE CHOLANGIOGRAM;  Surgeon: Michael Boston, MD;  Location: [facility];  Service: General;  Laterality: N/A;   DILATATION & CURETTAGE/HYSTEROSCOPY WITH MYOSURE N/A 10/24/2019   Procedure: DILATATION & CURETTAGE/HYSTEROSCOPY WITH MYOSURE;  Surgeon: Joseph Pierini, MD;  Location: [facility];  Service: Gynecology;  Laterality: N/A;   HYSTEROSCOPY WITH NOVASURE N/A 04/06/2020   Procedure: DILATION AND CURRETTAGE; NOVASURE ABLATION;  Surgeon: Joseph Pierini, MD;  Location: [facility];  Service: Gynecology;  Laterality: N/A;   LAPAROSCOPY N/A 04/06/2020   Procedure: LAPAROSCOPY OPERATIVE; LYSIS OF ADHESION;  Surgeon: Joseph Pierini, MD;  Location: [facility];  Service:  Gynecology;  Laterality: N/A;   ROBOTIC ASSISTED TOTAL HYSTERECTOMY WITH BILATERAL SALPINGO OOPHERECTOMY Bilateral 09/28/2020   Procedure: XI ROBOTIC ASSISTED TOTAL LAPAROSCOPIC HYSTERECTOMY WITH BILATERAL SALPINGO OOPHORECTOMY;  Surgeon: Princess Bruins, MD;  Location: [facility];  Service: Gynecology;  Laterality: Bilateral;  request 8:30am OR start time in [place] held time for Dr. Dellis Filbert requests 2 hours [place] 2/10 W/NICK FOR  TRACY (RNFA) TO ASSIST   TUBAL LIGATION Bilateral 02/05/2013   Procedure: POST PARTUM TUBAL LIGATION;  Surgeon: Woodroe Mode, MD;  Location: [facility];  Service: Gynecology;  Laterality: Bilateral;    [place]sh[place] clips   Social History   Occupational History   Not on file  Tobacco Use   Smoking status: Every Day    Packs/day: 0.50    Years: 20.00    Pack years: 10.00    Types: Cigarettes   Smokeless tobacco: Never  Vaping Use   Vaping Use: Never used  Substance and Sexual Activity   Alcohol use: No   Drug use: Never   Sexual activity: Yes    Birth control/protection: Surgical

## 2021-12-02 NOTE — Telephone Encounter (Signed)
Noted  

## 2021-12-05 MED ORDER — LIDOCAINE HCL 1 % IJ SOLN
3.0000 mL | INTRAMUSCULAR | Status: AC | PRN
  Administered 2021-12-02: 3 mL

## 2021-12-05 MED ORDER — BUPIVACAINE HCL 0.25 % IJ SOLN
0.6600 mL | INTRAMUSCULAR | Status: AC | PRN
  Administered 2021-12-02: .66 mL via INTRA_ARTICULAR

## 2021-12-05 MED ORDER — METHYLPREDNISOLONE ACETATE 40 MG/ML IJ SUSP
13.3300 mg | INTRAMUSCULAR | Status: AC | PRN
  Administered 2021-12-02: 13.33 mg via INTRA_ARTICULAR

## 2021-12-20 ENCOUNTER — Telehealth: Payer: Self-pay

## 2021-12-20 NOTE — Telephone Encounter (Signed)
VOB submitted for SynviscOne, bilateral knee BV pending

## 2021-12-23 ENCOUNTER — Telehealth: Payer: Self-pay

## 2021-12-23 NOTE — Telephone Encounter (Signed)
PA submitted through [employer] for bilateral knee PA Pending# [id number]

## 2021-12-27 ENCOUNTER — Telehealth: Payer: Self-pay

## 2021-12-27 DIAGNOSIS — M170 Bilateral primary osteoarthritis of knee: Secondary | ICD-10-CM

## 2021-12-27 NOTE — Telephone Encounter (Signed)
Tried calling patient to schedule for gel injection, but no answer and VM is full. Patient is able to schedule with Dr. Erlinda Hong.  Check referrals tab

## 2021-12-28 ENCOUNTER — Telehealth: Payer: Self-pay | Admitting: Orthopaedic Surgery

## 2021-12-28 NOTE — Telephone Encounter (Signed)
Please call the pt regarding Knees swelling

## 2021-12-29 ENCOUNTER — Telehealth: Payer: Self-pay | Admitting: Orthopaedic Surgery

## 2022-01-01 ENCOUNTER — Encounter: Payer: Self-pay | Admitting: Orthopaedic Surgery

## 2022-01-08 ENCOUNTER — Other Ambulatory Visit: Payer: Self-pay | Admitting: Physician Assistant

## 2022-01-08 MED ORDER — TRAMADOL HCL 50 MG PO TABS: 50.0000 mg | ORAL_TABLET | Freq: Two times a day (BID) | ORAL | 2 refills | Status: DC | PRN

## 2022-01-08 NOTE — Telephone Encounter (Signed)
Can you please let patient know that I was out of town last week and am just now seeing this message.  I did just sent in tramadol

## 2022-01-11 ENCOUNTER — Encounter: Payer: Self-pay | Admitting: Orthopaedic Surgery

## 2022-01-11 ENCOUNTER — Ambulatory Visit: Payer: BC Managed Care – PPO | Admitting: Orthopaedic Surgery

## 2022-01-11 DIAGNOSIS — M170 Bilateral primary osteoarthritis of knee: Secondary | ICD-10-CM | POA: Diagnosis not present

## 2022-01-11 DIAGNOSIS — M1711 Unilateral primary osteoarthritis, right knee: Secondary | ICD-10-CM

## 2022-01-11 DIAGNOSIS — M1712 Unilateral primary osteoarthritis, left knee: Secondary | ICD-10-CM | POA: Diagnosis not present

## 2022-01-11 MED ORDER — LIDOCAINE HCL 1 % IJ SOLN
3.0000 mL | INTRAMUSCULAR | Status: AC | PRN
  Administered 2022-01-11: 3 mL

## 2022-01-11 MED ORDER — BUPIVACAINE HCL 0.25 % IJ SOLN
0.6600 mL | INTRAMUSCULAR | Status: AC | PRN
  Administered 2022-01-11: .66 mL via INTRA_ARTICULAR

## 2022-01-11 MED ORDER — HYLAN G-F 20 48 MG/6ML IX SOSY
48.0000 mg | PREFILLED_SYRINGE | INTRA_ARTICULAR | Status: AC | PRN
  Administered 2022-01-11: 48 mg via INTRA_ARTICULAR

## 2022-01-11 NOTE — Telephone Encounter (Signed)
Pt was here for an appt this morning.

## 2022-01-11 NOTE — Progress Notes (Signed)
Office Visit Note   Patient: Theresa Sawyer           Date of Birth: 1980-10-04           MRN: [id number] Visit Date: 01/11/2022              Requested by: Horald Pollen, [place],  [place] [id number] PCP: Horald Pollen, MD   Assessment & Plan: Visit Diagnoses:  1. Bilateral primary osteoarthritis of knee     Plan: Bilateral knee osteoarthritis.  Today, we proceeded with Synvisc 1 injections to both knees.  She tolerated these well.  She will follow-up with [place] as needed.  Follow-Up Instructions: Return if symptoms worsen or fail to improve.   Orders:  Orders Placed This Encounter  Procedures   Large Joint Inj   No orders of the defined types were placed in this encounter.     Procedures: Large Joint Inj: bilateral knee on 01/11/2022 8:52 AM Indications: pain Details: 22 G needle, anterolateral approach Medications (Right): 0.66 mL bupivacaine 0.25 %; 3 mL lidocaine 1 %; 48 mg Hylan 48 MG/6ML Medications (Left): 0.66 mL bupivacaine 0.25 %; 3 mL lidocaine 1 %; 48 mg Hylan 48 MG/6ML      Clinical Data: No additional findings.   Subjective: Chief Complaint  Patient presents with   Right Knee - Follow-up    Synvisc One   Left Knee - Follow-up    Synvisc One    HPI patient is a pleasant 41 year old female with underlying bilateral knee osteoarthritis who comes in today for Synvisc 1 injections to both knees.  No previous viscosupplementation injections.  Her last cortisone injections provided only a few days relief.     Objective: Vital Signs: LMP 09/01/2020     Ortho Exam stable bilateral knee exam  Specialty Comments:  No specialty comments available.  Imaging: No new imaging  PMFS History: Patient Active Problem List   Diagnosis Date Noted   Acute cholecystitis 10/24/2021   Dyslipidemia 10/30/2020   Prediabetes 10/30/2020   Postoperative state 09/28/2020   Essential hypertension 08/04/2020   Vitamin D  deficiency 08/04/2020   Gastroesophageal reflux disease without esophagitis 08/04/2020   Migraine without aura and without status migrainosus, not intractable 08/04/2020   Primary osteoarthritis involving multiple joints 03/18/2020   Dysfunctional uterine bleeding 03/18/2020   Generalized anxiety disorder 03/18/2020   Psoriatic arthritis ([place]) 01/24/2020   Primary osteoarthritis of both knees 01/24/2020   History of hyperlipidemia 01/24/2020   History of iron deficiency anemia 01/24/2020   BMI 40.0-44.9, adult ([place]) 01/24/2020   OSA on CPAP 04/24/2019   Psoriasis 12/05/2018   Situational anxiety 12/05/2018   Intermittent hypertension 12/05/2018   Past Medical History:  Diagnosis Date   Abnormal uterine bleeding (AUB)    Anxiety    Arthritis    oa   Depression    GERD (gastroesophageal reflux disease)    History of chest pain (10-20-2019  pt denies any cardiac s&s)   mutliple ED visits, atypical chest pain, chest wall pain, muscularskeletal chest pain;  pt had cardiology evaulation dated 04-23-2018 note in epic , state atypical chest wall pain with negative d dimer and troponin's multiple times in epic, suggested CTA  (pt did not get done)   Hyperlipidemia    Hypertension    IDA (iron deficiency anemia)    none recently   Menorrhagia    Migraine    OSA on CPAP    followed by dr  ather cpap set on 7 to 14   Psoriasis    Urinary frequency     Family History  Problem Relation Age of Onset   Diabetes Maternal Grandmother    Diabetes Paternal Grandmother    Anxiety disorder Mother    Psoriasis Sister    Heart attack Brother 28       deceased from this   Anxiety disorder Brother    Cirrhosis Maternal Grandfather        alcohol related   Allergy (severe) Son    Healthy Son    Colon cancer Neg Hx    Esophageal cancer Neg Hx    Colon polyps Neg Hx    Stomach cancer Neg Hx    Pancreatic cancer Neg Hx     Past Surgical History:  Procedure Laterality Date   CHOLECYSTECTOMY  N/A 10/25/2021   Procedure: LAPAROSCOPIC CHOLECYSTECTOMY with INTRAOPERATIVE CHOLANGIOGRAM;  Surgeon: Michael Boston, MD;  Location: [facility];  Service: General;  Laterality: N/A;   DILATATION & CURETTAGE/HYSTEROSCOPY WITH MYOSURE N/A 10/24/2019   Procedure: DILATATION & CURETTAGE/HYSTEROSCOPY WITH MYOSURE;  Surgeon: Joseph Pierini, MD;  Location: [facility];  Service: Gynecology;  Laterality: N/A;   HYSTEROSCOPY WITH NOVASURE N/A 04/06/2020   Procedure: DILATION AND CURRETTAGE; NOVASURE ABLATION;  Surgeon: Joseph Pierini, MD;  Location: [facility];  Service: Gynecology;  Laterality: N/A;   LAPAROSCOPY N/A 04/06/2020   Procedure: LAPAROSCOPY OPERATIVE; LYSIS OF ADHESION;  Surgeon: Joseph Pierini, MD;  Location: [facility];  Service: Gynecology;  Laterality: N/A;   ROBOTIC ASSISTED TOTAL HYSTERECTOMY WITH BILATERAL SALPINGO OOPHERECTOMY Bilateral 09/28/2020   Procedure: XI ROBOTIC ASSISTED TOTAL LAPAROSCOPIC HYSTERECTOMY WITH BILATERAL SALPINGO OOPHORECTOMY;  Surgeon: Princess Bruins, MD;  Location: [facility];  Service: Gynecology;  Laterality: Bilateral;  request 8:30am OR start time in [place] held time for Dr. Dellis Filbert requests 2 hours [place] 2/10 W/NICK FOR  TRACY (RNFA) TO ASSIST   TUBAL LIGATION Bilateral 02/05/2013   Procedure: POST PARTUM TUBAL LIGATION;  Surgeon: Woodroe Mode, MD;  Location: [facility];  Service: Gynecology;  Laterality: Bilateral;    Filshie clips   Social History   Occupational History   Not on file  Tobacco Use   Smoking status: Every Day    Packs/day: 0.50    Years: 20.00    Total pack years: 10.00    Types: Cigarettes   Smokeless tobacco: Never  Vaping Use   Vaping Use: Never used  Substance and Sexual Activity   Alcohol use: No   Drug use: Never   Sexual activity: Yes    Birth control/protection: Surgical

## 2022-03-09 ENCOUNTER — Other Ambulatory Visit: Payer: Self-pay | Admitting: Obstetrics & Gynecology

## 2022-03-09 NOTE — Telephone Encounter (Signed)
Theresa Sawyer, RMA Voicemail is full/ I will try again tomorrow

## 2022-03-09 NOTE — Telephone Encounter (Signed)
Patient needs to be scheduled for an AEX prior to [place] sending refill request for her estrogen patches to Dr. Marguerita Merles.  Message sent to appt desk to schedule her and let us know.

## 2022-03-10 ENCOUNTER — Ambulatory Visit: Payer: BC Managed Care – PPO | Admitting: Orthopaedic Surgery

## 2022-03-19 ENCOUNTER — Other Ambulatory Visit: Payer: Self-pay | Admitting: Emergency Medicine

## 2022-03-19 DIAGNOSIS — F411 Generalized anxiety disorder: Secondary | ICD-10-CM

## 2022-08-15 ENCOUNTER — Telehealth: Payer: Self-pay | Admitting: Orthopaedic Surgery

## 2022-08-15 NOTE — Telephone Encounter (Signed)
Called patient to schedule an appointment per [facility] request with Dr. Erlinda Hong for knees and hands Osteoarthritis. Got recording mailbox is full cannot leave message at this time

## 2022-08-16 ENCOUNTER — Other Ambulatory Visit: Payer: Self-pay

## 2022-08-16 NOTE — Telephone Encounter (Signed)
Last AEX 07/28/2020 with Dr. Delilah Shan.  Scheduled 10/22/21 for AEX.  Requesting refill on Estradiol patch.

## 2022-08-17 MED ORDER — ESTRADIOL 0.1 MG/24HR TD PTTW: 1.0000 | MEDICATED_PATCH | TRANSDERMAL | 0 refills | Status: DC

## 2022-08-23 ENCOUNTER — Ambulatory Visit (INDEPENDENT_AMBULATORY_CARE_PROVIDER_SITE_OTHER): Payer: Commercial Managed Care - HMO | Admitting: Family Medicine

## 2022-08-23 ENCOUNTER — Encounter: Payer: Self-pay | Admitting: Family Medicine

## 2022-08-23 VITALS — BP 120/82 | HR 68 | Temp 98.5°F | Ht 65.0 in | Wt 239.0 lb

## 2022-08-23 DIAGNOSIS — L409 Psoriasis, unspecified: Secondary | ICD-10-CM

## 2022-08-23 DIAGNOSIS — I10 Essential (primary) hypertension: Secondary | ICD-10-CM | POA: Diagnosis not present

## 2022-08-23 DIAGNOSIS — R7303 Prediabetes: Secondary | ICD-10-CM | POA: Diagnosis not present

## 2022-08-23 DIAGNOSIS — E785 Hyperlipidemia, unspecified: Secondary | ICD-10-CM | POA: Diagnosis not present

## 2022-08-23 DIAGNOSIS — Z7689 Persons encountering health services in other specified circumstances: Secondary | ICD-10-CM | POA: Insufficient documentation

## 2022-08-23 DIAGNOSIS — F17210 Nicotine dependence, cigarettes, uncomplicated: Secondary | ICD-10-CM

## 2022-08-23 DIAGNOSIS — Z1231 Encounter for screening mammogram for malignant neoplasm of breast: Secondary | ICD-10-CM

## 2022-08-23 DIAGNOSIS — Z1159 Encounter for screening for other viral diseases: Secondary | ICD-10-CM

## 2022-08-23 DIAGNOSIS — Z716 Tobacco abuse counseling: Secondary | ICD-10-CM

## 2022-08-23 DIAGNOSIS — F411 Generalized anxiety disorder: Secondary | ICD-10-CM

## 2022-08-23 MED ORDER — VALSARTAN 80 MG PO TABS: 120.0000 mg | ORAL_TABLET | Freq: Every day | ORAL | 3 refills | Status: DC

## 2022-08-23 MED ORDER — HYDROCHLOROTHIAZIDE 25 MG PO TABS: 25.0000 mg | ORAL_TABLET | Freq: Every day | ORAL | 3 refills | Status: DC

## 2022-08-23 MED ORDER — ROSUVASTATIN CALCIUM 10 MG PO TABS: 10.0000 mg | ORAL_TABLET | Freq: Every day | ORAL | 3 refills | Status: DC

## 2022-08-23 MED ORDER — BUPROPION HCL ER (SR) 150 MG PO TB12: 150.0000 mg | ORAL_TABLET | Freq: Two times a day (BID) | ORAL | 0 refills | Status: DC

## 2022-08-23 NOTE — Assessment & Plan Note (Signed)
0.5ppd for 20+ years. Ready to quit. Failed patches. Start Wellbutrin SR 178m daily for 3 days then increase to BID with target quit day 08/30/2022. Follow-up in 1 month.

## 2022-08-23 NOTE — Progress Notes (Signed)
New Patient Office Visit  Subjective    Patient ID: Theresa Sawyer, female    DOB: 1980-10-03  Age: 42 y.o. MRN: [id number]  CC:  Chief Complaint  Patient presents with   Establish Care    Advice on how to quite smoking? Try patch and it has not worked     HPI [name] presents to establish care. Oriented to practice routines and expectations. Has not seen a PCP in last year. PMH includes HTN, HLD, pre-diabetes, depression and anxiety. She sees [facility] for her knee; Dermatology for psoriasis; Gynecology for routine women's health Eats a regular diet, limits fried foods and sweets. No exercise routine.  Depression uncontrolled on Lexapro 33m daily and Xanax 0.513mPRN and she takes this rarely. 20+ year smoker 0.5ppd wants to quit. Failed patches.  Cervical CA: hysterectomy Mammogram: never Tobacco: smoker BMI: 39.77  STD: declines Vaccines: declines flu/covid   Outpatient Encounter Medications as of 08/23/2022  Medication Sig   ALPRAZolam (XANAX) 0.5 MG tablet TAKE 1 TABLET BY MOUTH EVERY DAY AS NEEDED FOR ANXIETY   buPROPion (WELLBUTRIN SR) 150 MG 12 hr tablet Take 1 tablet (150 mg total) by mouth 2 (two) times daily. Take 1 tablet ONCE daily for first 3 days then increase to one tablet TWICE daily thereafter   escitalopram (LEXAPRO) 20 MG tablet TAKE 1 TABLET(20 MG) BY MOUTH DAILY   estradiol (VIVELLE-DOT) 0.1 MG/24HR patch Place 1 patch (0.1 mg total) onto the skin 2 (two) times a week.   famotidine (PEPCID) 40 MG tablet TAKE 1 TABLET(40 MG) BY MOUTH TWICE DAILY   metFORMIN (GLUCOPHAGE) 500 MG tablet Take 1 tablet (500 mg total) by mouth 2 (two) times daily with a meal.   valsartan (DIOVAN) 80 MG tablet Take 1.5 tablets (120 mg total) by mouth daily.   Vitamin D, Ergocalciferol, (DRISDOL) 1.25 MG (50000 UNIT) CAPS capsule TAKE 1 CAPSULE BY MOUTH EVERY 7 DAYS   [DISCONTINUED] hydrochlorothiazide (HYDRODIURIL) 25 MG tablet Take 1 tablet (25 mg  total) by mouth daily.   [DISCONTINUED] rosuvastatin (CRESTOR) 10 MG tablet Take 1 tablet (10 mg total) by mouth daily.   [DISCONTINUED] valsartan (DIOVAN) 80 MG tablet TAKE 1 AND 1/2 TABLETS(120 MG) BY MOUTH DAILY (Patient taking differently: Take 120 mg by mouth daily.)   hydrochlorothiazide (HYDRODIURIL) 25 MG tablet Take 1 tablet (25 mg total) by mouth daily.   rosuvastatin (CRESTOR) 10 MG tablet Take 1 tablet (10 mg total) by mouth daily.   [DISCONTINUED] pregabalin (LYRICA) 25 MG capsule Take 25 mg by mouth 2 (two) times daily.   [DISCONTINUED] traMADol (ULTRAM) 50 MG tablet Take 1 tablet (50 mg total) by mouth 2 (two) times daily as needed for moderate pain or severe pain. (Patient not taking: Reported on 08/23/2022)   No facility-administered encounter medications on file as of 08/23/2022.    Past Medical History:  Diagnosis Date   Abnormal uterine bleeding (AUB)    Anxiety    Arthritis    oa   Depression    GERD (gastroesophageal reflux disease)    History of chest pain (10-20-2019  pt denies any cardiac s&s)   mutliple ED visits, atypical chest pain, chest wall pain, muscularskeletal chest pain;  pt had cardiology evaulation dated 04-23-2018 note in epic , state atypical chest wall pain with negative d dimer and troponin's multiple times in epic, suggested CTA  (pt did not get done)   Hyperlipidemia    Hypertension  IDA (iron deficiency anemia)    none recently   Menorrhagia    Migraine    OSA on CPAP    followed by dr ather cpap set on 7 to 14   Psoriasis    Urinary frequency     Past Surgical History:  Procedure Laterality Date   CHOLECYSTECTOMY N/A 10/25/2021   Procedure: LAPAROSCOPIC CHOLECYSTECTOMY with INTRAOPERATIVE CHOLANGIOGRAM;  Surgeon: Michael Boston, MD;  Location: [facility];  Service: General;  Laterality: N/A;   [place] N/A 10/24/2019   Procedure: DILATATION & CURETTAGE/HYSTEROSCOPY WITH MYOSURE;  Surgeon: Joseph Pierini, MD;  Location: [place];  Service: Gynecology;  Laterality: N/A;   HYSTEROSCOPY WITH NOVASURE N/A 04/06/2020   Procedure: DILATION AND CURRETTAGE; NOVASURE ABLATION;  Surgeon: Joseph Pierini, MD;  Location: [place];  Service: Gynecology;  Laterality: N/A;   LAPAROSCOPY N/A 04/06/2020   Procedure: LAPAROSCOPY OPERATIVE; LYSIS OF ADHESION;  Surgeon: Joseph Pierini, MD;  Location: [place];  Service: Gynecology;  Laterality: N/A;   ROBOTIC ASSISTED TOTAL HYSTERECTOMY WITH BILATERAL SALPINGO OOPHERECTOMY Bilateral 09/28/2020   Procedure: XI ROBOTIC ASSISTED TOTAL LAPAROSCOPIC HYSTERECTOMY WITH BILATERAL SALPINGO OOPHORECTOMY;  Surgeon: Princess Bruins, MD;  Location: [place];  Service: Gynecology;  Laterality: Bilateral;  request 8:30am OR start time in [place] held time for Dr. Dellis Filbert requests 2 hours [place] 2/10 W/NICK FOR  TRACY (RNFA) TO ASSIST   TUBAL LIGATION Bilateral 02/05/2013   Procedure: POST PARTUM TUBAL LIGATION;  Surgeon: Woodroe Mode, MD;  Location: [facility];  Service: Gynecology;  Laterality: Bilateral;    Filshie clips    Family History  Problem Relation Age of Onset   Diabetes Maternal Grandmother    Diabetes Paternal Grandmother    Anxiety disorder Mother    Psoriasis Sister    Heart attack Brother 78       deceased from this   Anxiety disorder Brother    Cirrhosis Maternal Grandfather        alcohol related   Allergy (severe) Son    Healthy Son    Colon cancer Neg Hx    Esophageal cancer Neg Hx    Colon polyps Neg Hx    Stomach cancer Neg Hx    Pancreatic cancer Neg Hx     Social History   Socioeconomic History   Marital status: Legally Separated    Spouse name: Not on file   Number of children: Not on file   Years of education: Not on file   Highest education level: Not on file  Occupational History   Not on file  Tobacco Use   Smoking status: Every Day     Packs/day: 0.50    Years: 20.00    Total pack years: 10.00    Types: Cigarettes   Smokeless tobacco: Never  Vaping Use   Vaping Use: Never used  Substance and Sexual Activity   Alcohol use: No   Drug use: Never   Sexual activity: Yes    Birth control/protection: Surgical  Other Topics Concern   Not on file  Social History Narrative   Not on file   Social Determinants of Health   Financial Resource Strain: Not on file  Food Insecurity: Not on file  Transportation Needs: Not on file  Physical Activity: Not on file  Stress: Not on file  Social Connections: Not on file  Intimate Partner Violence: Not on file    Review of Systems  Constitutional:  Negative.   HENT: Negative.    Eyes: Negative.   Respiratory: Negative.    Cardiovascular: Negative.   Gastrointestinal: Negative.   Genitourinary: Negative.   Musculoskeletal: Negative.   Skin:  Positive for rash.  Neurological: Negative.   Endo/Heme/Allergies: Negative.   Psychiatric/Behavioral:  Positive for depression. Negative for suicidal ideas. The patient is nervous/anxious.   All other systems reviewed and are negative.       Objective    BP 120/82   Pulse 68   Temp 98.5 F (36.9 C) (Oral)   Ht 5' 5"$  (1.651 m)   Wt 239 lb (108.4 kg)   LMP 09/01/2020   SpO2 97%   BMI 39.77 kg/m   Physical Exam Vitals and nursing note reviewed.  Constitutional:      Appearance: Normal appearance. She is normal weight.  HENT:     Head: Normocephalic and atraumatic.     Right Ear: Tympanic membrane, ear canal and external ear normal.     Left Ear: Tympanic membrane, ear canal and external ear normal.     Nose: Nose normal.     Mouth/Throat:     Mouth: Mucous membranes are moist.     Pharynx: Oropharynx is clear.  Eyes:     Extraocular Movements: Extraocular movements intact.     Conjunctiva/sclera: Conjunctivae normal.     Pupils: Pupils are equal, round, and reactive to light.  Cardiovascular:     Rate and Rhythm:  Normal rate and regular rhythm.     Pulses: Normal pulses.     Heart sounds: Normal heart sounds.  Pulmonary:     Effort: Pulmonary effort is normal.     Breath sounds: Normal breath sounds.  Abdominal:     General: Bowel sounds are normal.     Palpations: Abdomen is soft.  Musculoskeletal:        General: Normal range of motion.     Cervical back: Normal range of motion and neck supple.  Skin:    General: Skin is warm and dry.     Capillary Refill: Capillary refill takes less than 2 seconds.     Findings: Rash present.          Comments: Limited skin assessment performed, clearly demarcated areas of erythema with silver plaques covering both posterior forearms  Neurological:     General: No focal deficit present.     Mental Status: She is alert and oriented to person, place, and time. Mental status is at baseline.  Psychiatric:        Mood and Affect: Mood normal.        Behavior: Behavior normal.        Thought Content: Thought content normal.        Judgment: Judgment normal.         Assessment & Plan:   Problem List Items Addressed This Visit       Cardiovascular and Mediastinum   Essential hypertension    Well controlled. Fasting labs today. Continue Valsartan and HCTZ. Denies chest pain, palpitations, dyspnea with exertion, swelling of extremities, lightheadedness, headaches, vision changes and will seek medical care for any of these. Encouraged heart healthy diet and 150 minutes of moderate intensity exercise weekly.      Relevant Medications   hydrochlorothiazide (HYDRODIURIL) 25 MG tablet   rosuvastatin (CRESTOR) 10 MG tablet   valsartan (DIOVAN) 80 MG tablet   Other Relevant Orders   CBC with Differential/Platelet   COMPLETE METABOLIC PANEL WITH GFR   Lipid  panel   TSH     Musculoskeletal and Integument   Psoriasis    Chronic. Severe and unmedicated. Will refer to Dermatology.      Relevant Orders   Ambulatory referral to Dermatology     Other    Generalized anxiety disorder    GAD-7 17 and PHQ-9 15. She denies current ongoing SI or a plan. I provided resources if these thoughts occur. She is currently taking Lexapro 70m daily. Will start Wellbutrin SR 1577mBID for smoking cessation as well as added benefits as antidepressant. Follow up in 4 weeks.        Relevant Medications   buPROPion (WELLBUTRIN SR) 150 MG 12 hr tablet   Dyslipidemia    Continue Crestor. Fasting labs today.      Relevant Medications   rosuvastatin (CRESTOR) 10 MG tablet   Other Relevant Orders   Lipid panel   Prediabetes   Relevant Orders   Vitamin B12   Hemoglobin A1c   Encounter to establish care with new doctor - Primary    Today your medical history was reviewed and routine physical exam with labs was performed. Recommend 150 minutes of moderate intensity exercise weekly and consuming a well-balanced diet. Advised to stop smoking if a smoker, avoid smoking if a non-smoker, limit alcohol consumption to 1 drink per day for women and 2 drinks per day for men, and avoid illicit drug use. Counseled on safe sex practices and offered STI testing today. Counseled on the importance of sunscreen use. Counseled in mental health awareness and when to seek medical care. Vaccine maintenance discussed. Appropriate health maintenance items reviewed. Return to office in 1 year for annual physical exam.       Tobacco dependence due to cigarettes    0.5ppd for 20+ years. Ready to quit. Failed patches. Start Wellbutrin SR 15064maily for 3 days then increase to BID with target quit day 08/30/2022. Follow-up in 1 month.      Other Visit Diagnoses     Need for hepatitis C screening test       Relevant Orders   Hepatitis C antibody   Encounter for screening mammogram for malignant neoplasm of breast       Relevant Orders   MM DIGITAL SCREENING BILATERAL   Encounter for smoking cessation counseling           Return in about 4 weeks (around 09/20/2022) for  follow-up.   AmbRubie MaidNP

## 2022-08-23 NOTE — Assessment & Plan Note (Signed)

## 2022-08-23 NOTE — Assessment & Plan Note (Signed)
GAD-7 17 and PHQ-9 15. She denies current ongoing SI or a plan. I provided resources if these thoughts occur. She is currently taking Lexapro 7m daily. Will start Wellbutrin SR 1529mBID for smoking cessation as well as added benefits as antidepressant. Follow up in 4 weeks.

## 2022-08-23 NOTE — Assessment & Plan Note (Signed)
Continue Crestor. Fasting labs today.

## 2022-08-23 NOTE — Assessment & Plan Note (Signed)
Chronic. Severe and unmedicated. Will refer to Dermatology.

## 2022-08-23 NOTE — Patient Instructions (Signed)
It was great to meet you today and I'm excited to have you join the [facility] practice. I hope you had a positive experience today! If you feel so inclined, please feel free to recommend our practice to friends and family. Mila Merry, FNP-C

## 2022-08-23 NOTE — Assessment & Plan Note (Signed)
Well controlled. Fasting labs today. Continue Valsartan and HCTZ. Denies chest pain, palpitations, dyspnea with exertion, swelling of extremities, lightheadedness, headaches, vision changes and will seek medical care for any of these. Encouraged heart healthy diet and 150 minutes of moderate intensity exercise weekly.

## 2022-08-24 ENCOUNTER — Other Ambulatory Visit: Payer: Self-pay | Admitting: Family Medicine

## 2022-08-24 DIAGNOSIS — E785 Hyperlipidemia, unspecified: Secondary | ICD-10-CM

## 2022-08-24 LAB — CBC WITH DIFFERENTIAL/PLATELET
Absolute Monocytes: 968 cells/uL — ABNORMAL HIGH (ref 200–950)
Basophils Absolute: 106 cells/uL (ref 0–200)
Basophils Relative: 0.9 %
Eosinophils Absolute: 354 cells/uL (ref 15–500)
Eosinophils Relative: 3 %
HCT: 42.4 % (ref 35.0–45.0)
Hemoglobin: 14.8 g/dL (ref 11.7–15.5)
Lymphs Abs: 3988 cells/uL — ABNORMAL HIGH (ref 850–3900)
MCH: 31 pg (ref 27.0–33.0)
MCHC: 34.9 g/dL (ref 32.0–36.0)
MCV: 88.9 fL (ref 80.0–100.0)
MPV: 9.5 fL (ref 7.5–12.5)
Monocytes Relative: 8.2 %
Neutro Abs: 6384 cells/uL (ref 1500–7800)
Neutrophils Relative %: 54.1 %
Platelets: 365 10*3/uL (ref 140–400)
RBC: 4.77 10*6/uL (ref 3.80–5.10)
RDW: 12.9 % (ref 11.0–15.0)
Total Lymphocyte: 33.8 %
WBC: 11.8 10*3/uL — ABNORMAL HIGH (ref 3.8–10.8)

## 2022-08-24 LAB — HEMOGLOBIN A1C
Hgb A1c MFr Bld: 5.6 % of total Hgb (ref <5.7)
Mean Plasma Glucose: 114 mg/dL
eAG (mmol/L): 6.3 mmol/L

## 2022-08-24 LAB — COMPLETE METABOLIC PANEL WITH GFR
AG Ratio: 1.7 (calc) (ref 1.0–2.5)
ALT: 48 U/L — ABNORMAL HIGH (ref 6–29)
AST: 26 U/L (ref 10–30)
Albumin: 4.5 g/dL (ref 3.6–5.1)
Alkaline phosphatase (APISO): 90 U/L (ref 31–125)
BUN: 18 mg/dL (ref 7–25)
CO2: 24 mmol/L (ref 20–32)
Calcium: 9.7 mg/dL (ref 8.6–10.2)
Chloride: 106 mmol/L (ref 98–110)
Creat: 0.68 mg/dL (ref 0.50–0.99)
Globulin: 2.7 g/dL (calc) (ref 1.9–3.7)
Glucose, Bld: 87 mg/dL (ref 65–99)
Potassium: 4.6 mmol/L (ref 3.5–5.3)
Sodium: 139 mmol/L (ref 135–146)
Total Bilirubin: 0.4 mg/dL (ref 0.2–1.2)
Total Protein: 7.2 g/dL (ref 6.1–8.1)
eGFR: 111 mL/min/{1.73_m2} (ref >=60)

## 2022-08-24 LAB — HEPATITIS C ANTIBODY: Hepatitis C Ab: NONREACTIVE

## 2022-08-24 LAB — LIPID PANEL
Cholesterol: 206 mg/dL — ABNORMAL HIGH (ref <200)
HDL: 43 mg/dL — ABNORMAL LOW (ref >=50)
LDL Cholesterol (Calc): 112 mg/dL (calc) — ABNORMAL HIGH
Non-HDL Cholesterol (Calc): 163 mg/dL (calc) — ABNORMAL HIGH (ref <130)
Total CHOL/HDL Ratio: 4.8 (calc) (ref <5.0)
Triglycerides: 358 mg/dL — ABNORMAL HIGH (ref <150)

## 2022-08-24 LAB — TSH: TSH: 0.96 mIU/L

## 2022-08-24 LAB — VITAMIN B12: Vitamin B-12: 534 pg/mL (ref 200–1100)

## 2022-08-24 MED ORDER — ROSUVASTATIN CALCIUM 20 MG PO TABS: 10.0000 mg | ORAL_TABLET | Freq: Every day | ORAL | 3 refills | Status: DC

## 2022-08-24 MED ORDER — METFORMIN HCL ER 500 MG PO TB24: 500.0000 mg | ORAL_TABLET | Freq: Two times a day (BID) | ORAL | 3 refills | Status: AC

## 2022-09-21 ENCOUNTER — Ambulatory Visit: Payer: Commercial Managed Care - HMO | Admitting: Family Medicine

## 2022-09-28 ENCOUNTER — Ambulatory Visit: Payer: Commercial Managed Care - HMO | Admitting: Family Medicine

## 2022-10-05 ENCOUNTER — Ambulatory Visit (INDEPENDENT_AMBULATORY_CARE_PROVIDER_SITE_OTHER): Payer: Commercial Managed Care - HMO | Admitting: Family Medicine

## 2022-10-05 ENCOUNTER — Encounter: Payer: Self-pay | Admitting: Family Medicine

## 2022-10-05 VITALS — BP 120/68 | HR 68 | Temp 97.8°F | Ht 65.0 in | Wt 240.0 lb

## 2022-10-05 DIAGNOSIS — F32A Depression, unspecified: Secondary | ICD-10-CM | POA: Insufficient documentation

## 2022-10-05 DIAGNOSIS — L409 Psoriasis, unspecified: Secondary | ICD-10-CM

## 2022-10-05 DIAGNOSIS — F411 Generalized anxiety disorder: Secondary | ICD-10-CM

## 2022-10-05 MED ORDER — BETAMETHASONE DIPROPIONATE 0.05 % EX CREA: TOPICAL_CREAM | Freq: Two times a day (BID) | CUTANEOUS | 0 refills | Status: DC

## 2022-10-05 NOTE — Assessment & Plan Note (Signed)
Chronic. Dermatology appointment 4/11. Start Betamethasone 0.05% cream BID.

## 2022-10-05 NOTE — Assessment & Plan Note (Signed)
GAD 13. She reports improvement in symptoms and would like to maintain current medication regimen. Continue Lexapro 20mg  daily and Wellbutrin 150mg  BID.

## 2022-10-05 NOTE — Progress Notes (Signed)
   Acute Office Visit  Subjective:     Patient ID: Theresa Sawyer, female    DOB: 29-Apr-1981, 42 y.o.   MRN: [id number]  Chief Complaint  Patient presents with   Follow-up    4 week f/u      HPI Patient is in today for anxiety and depression and smoking cessation follow up. She is currently taking Lexapro 20mg  daily and Wellbutrin 150mg  BID. She reports her depression is improved and she has cut her smoking in half. Not interested in medication increase.  Review of Systems  All other systems reviewed and are negative.       Objective:    BP 120/68   Pulse 68   Temp 97.8 F (36.6 C) (Oral)   Ht 5\' 5"  (1.651 m)   Wt 240 lb (108.9 kg)   LMP 09/01/2020   SpO2 99%   BMI 39.94 kg/m    Physical Exam Vitals and nursing note reviewed.  Constitutional:      Appearance: Normal appearance. She is normal weight.  HENT:     Head: Normocephalic and atraumatic.  Skin:    General: Skin is warm and dry.     Comments: Erythematous plaques with silvery scale present to elbows, knees, and knuckles.  Neurological:     General: No focal deficit present.     Mental Status: She is alert and oriented to person, place, and time. Mental status is at baseline.  Psychiatric:        Mood and Affect: Mood normal.        Behavior: Behavior normal.        Thought Content: Thought content normal.        Judgment: Judgment normal.     No results found for any visits on 10/05/22.      Assessment & Plan:   Problem List Items Addressed This Visit       Musculoskeletal and Integument   Psoriasis - Primary    Chronic. Dermatology appointment 4/11. Start Betamethasone 0.05% cream BID.        Other   Generalized anxiety disorder    GAD 13. She reports improvement in symptoms and would like to maintain current medication regimen. Continue Lexapro 20mg  daily and Wellbutrin 150mg  BID.      Depression    PHQ 13. She reports improvement in symptoms and would like to maintain current  medication regimen. Continue Lexapro 20mg  daily and Wellbutrin 150mg  BID.Continue Lexapro 20mg  daily and Wellbutrin 150mg  BID. She did report a history of suicidal thought but denies current ideations. Provided with handout for how to handle suicidal thoughts, information on walk-in clinic and encouraged her to dial [phone] if these thought ever occur or seek medical attention.       Meds ordered this encounter  Medications   betamethasone dipropionate 0.05 % cream    Sig: Apply topically 2 (two) times daily.    Dispense:  30 g    Refill:  0    Order Specific Question:   Supervising Provider    Answer:   Jenna Luo T [[id number]]    Return in about 5 months (around 03/07/2023).  Rubie Maid, FNP

## 2022-10-05 NOTE — Assessment & Plan Note (Signed)
PHQ 13. She reports improvement in symptoms and would like to maintain current medication regimen. Continue Lexapro 20mg  daily and Wellbutrin 150mg  BID.Continue Lexapro 20mg  daily and Wellbutrin 150mg  BID. She did report a history of suicidal thought but denies current ideations. Provided with handout for how to handle suicidal thoughts, information on walk-in clinic and encouraged her to dial [phone] if these thought ever occur or seek medical attention.

## 2022-10-05 NOTE — Patient Instructions (Signed)
Call [phone] for emergence of thoughts of self harm. Walk in clinic: [facility], [place] [address] Ph: [phone] Fax: [phone]

## 2022-10-18 ENCOUNTER — Encounter: Payer: Self-pay | Admitting: Obstetrics & Gynecology

## 2022-10-23 ENCOUNTER — Ambulatory Visit: Payer: Self-pay | Admitting: Obstetrics & Gynecology

## 2022-11-17 ENCOUNTER — Ambulatory Visit: Payer: Commercial Managed Care - HMO | Admitting: Physician Assistant

## 2022-11-30 ENCOUNTER — Ambulatory Visit: Payer: Commercial Managed Care - HMO | Admitting: Physician Assistant

## 2022-12-28 ENCOUNTER — Telehealth: Payer: Self-pay | Admitting: Physician Assistant

## 2022-12-28 DIAGNOSIS — J029 Acute pharyngitis, unspecified: Secondary | ICD-10-CM

## 2022-12-28 NOTE — Progress Notes (Signed)
E-Visit for Sore Throat  We are sorry that you are not feeling well.  Here is how we plan to help!  Your symptoms indicate a likely viral infection (Pharyngitis).   Pharyngitis is inflammation in the back of the throat which can cause a sore throat, scratchiness and sometimes difficulty swallowing.   Pharyngitis is typically caused by a respiratory virus and will just run its course.  Please keep in mind that your symptoms could last up to 10 days.  For throat pain, we recommend over the counter oral pain relief medications such as acetaminophen or aspirin, or anti-inflammatory medications such as ibuprofen or naproxen sodium.  Topical treatments such as oral throat lozenges or sprays may be used as needed.  Avoid close contact with loved ones, especially the very young and elderly.  Remember to wash your hands thoroughly throughout the day as this is the number one way to prevent the spread of infection and wipe down door knobs and counters with disinfectant.  After careful review of your answers, I would not recommend an antibiotic for your condition.  Antibiotics should not be used to treat conditions that we suspect are caused by viruses like the virus that causes the common cold or flu. However, some people can have Strep with atypical symptoms. You may need formal testing in clinic or office to confirm if your symptoms continue or worsen.  Providers prescribe antibiotics to treat infections caused by bacteria. Antibiotics are very powerful in treating bacterial infections when they are used properly.  To maintain their effectiveness, they should be used only when necessary.  Overuse of antibiotics has resulted in the development of super bugs that are resistant to treatment!    Home Care: Only take medications as instructed by your medical team. Do not drink alcohol while taking these medications. A steam or ultrasonic humidifier can help congestion.  You can place a towel over your head and  breathe in the steam from hot water coming from a faucet. Avoid close contacts especially the very young and the elderly. Cover your mouth when you cough or sneeze. Always remember to wash your hands.  Get Help Right Away If: You develop worsening fever or throat pain. You develop a severe head ache or visual changes. Your symptoms persist after you have completed your treatment plan.  Make sure you Understand these instructions. Will watch your condition. Will get help right away if you are not doing well or get worse.   Thank you for choosing an e-visit.  Your e-visit answers were reviewed by a board certified advanced clinical practitioner to complete your personal care plan. Depending upon the condition, your plan could have included both over the counter or prescription medications.  Please review your pharmacy choice. Make sure the pharmacy is open so you can pick up prescription now. If there is a problem, you may contact your provider through MyChart messaging and have the prescription routed to another pharmacy.  Your safety is important to us. If you have drug allergies check your prescription carefully.   For the next 24 hours you can use MyChart to ask questions about today's visit, request a non-urgent call back, or ask for a work or school excuse. You will get an email in the next two days asking about your experience. I hope that your e-visit has been valuable and will speed your recovery.  I have spent 5 minutes in review of e-visit questionnaire, review and updating patient chart, medical decision making and response   to patient.   Reiko Vinje M Iraida Cragin, PA-C  

## 2023-03-23 ENCOUNTER — Other Ambulatory Visit: Payer: Self-pay | Admitting: Emergency Medicine

## 2023-03-23 DIAGNOSIS — F411 Generalized anxiety disorder: Secondary | ICD-10-CM

## 2023-04-09 ENCOUNTER — Ambulatory Visit: Payer: Commercial Managed Care - HMO | Admitting: Family Medicine

## 2023-07-05 IMAGING — MR MR LUMBAR SPINE W/O CM
4 of 5 series · 19 of 48 positions shown · non-contrast
Comparison: X-ray lumbar spine 11/19/2020.

CLINICAL DATA: Patient complains of low back pain, bilateral thigh,
bilateral buttocks and leg pain x 6 months. No known injury.

EXAM:
MRI LUMBAR SPINE WITHOUT CONTRAST
TECHNIQUE: Multiplanar, multisequence MR imaging of the lumbar spine was
performed. No intravenous contrast was administered.

[Series 5: T2 · sagittal · 4.0mm · 0.73mm/px · 6 of 17 slices shown (1 of 2)]
[im 1/17]
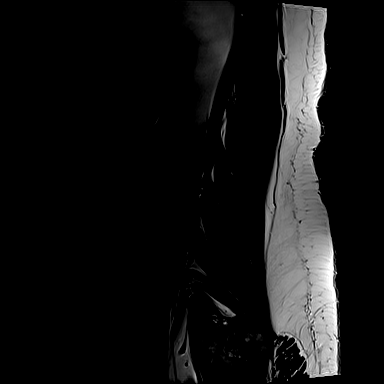
[im 4/17]
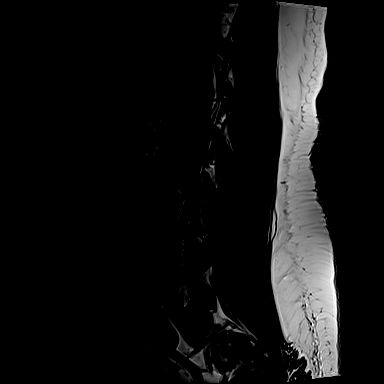
[im 7/17]
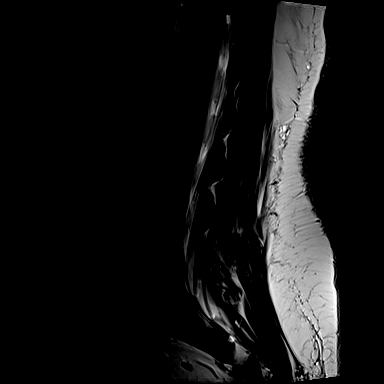
[im 10/17]
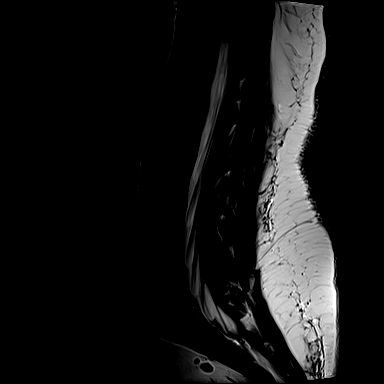
[im 13/17]
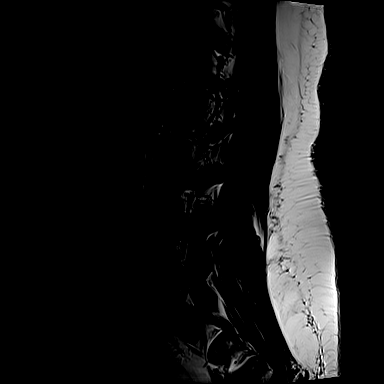
[im 17/17]
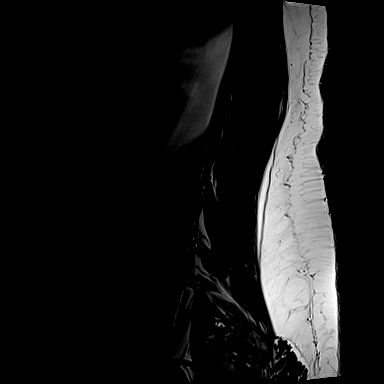

[Series 6: T1 · sagittal · 4.0mm · 0.73mm/px · 3 of 17 slices shown (1 of 2)]
[im 4/17]
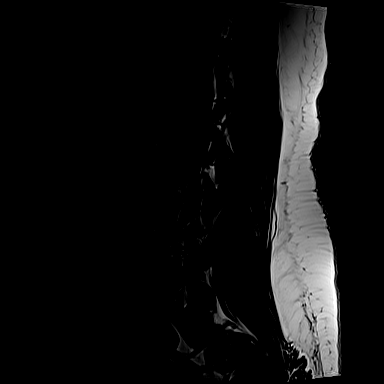
[im 10/17]
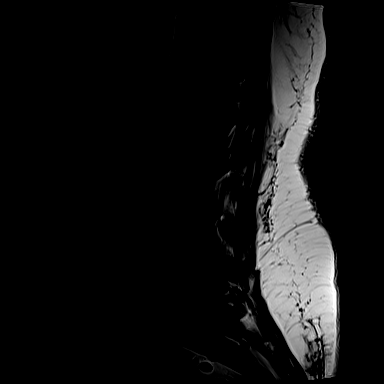
[im 17/17]
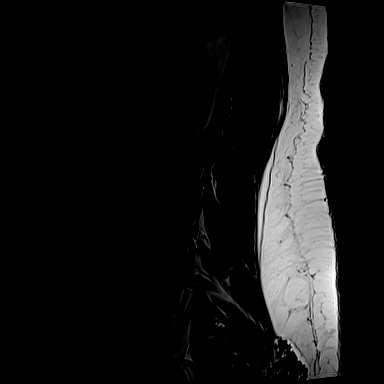

[Series 10: T1 · axial · 4.0mm · 0.28mm/px · z∈[-65,+111]mm · 3 of 42 slices shown (2 of 2)]
[im 6/42]
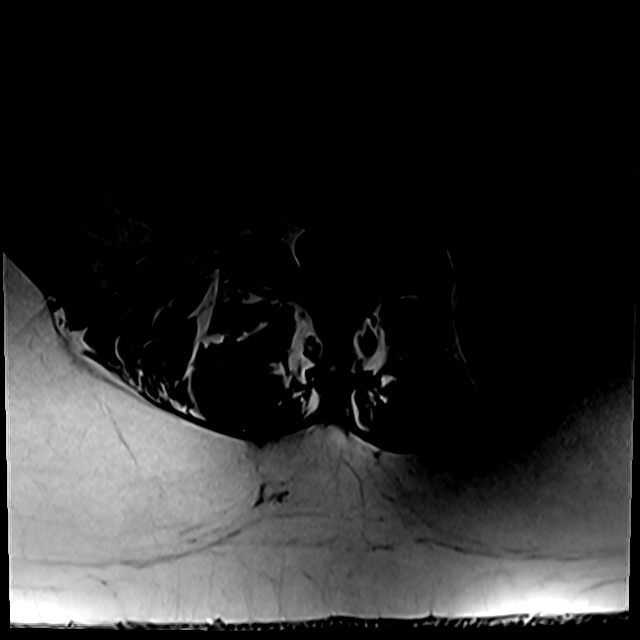
[im 21/42]
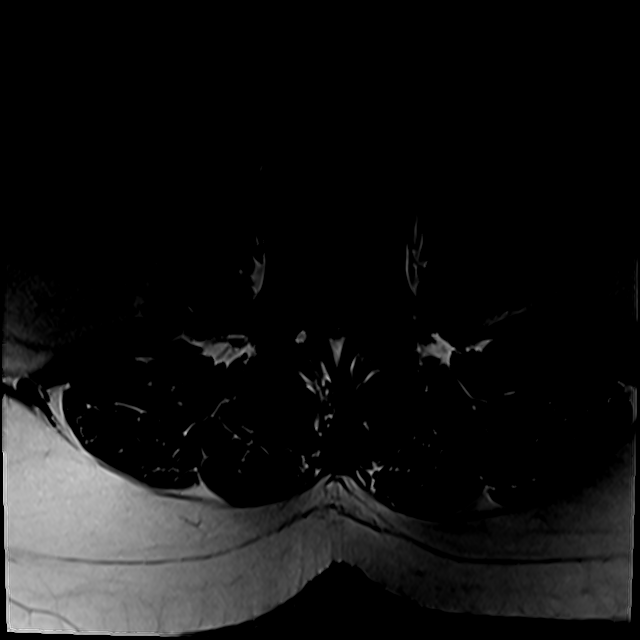
[im 36/42]
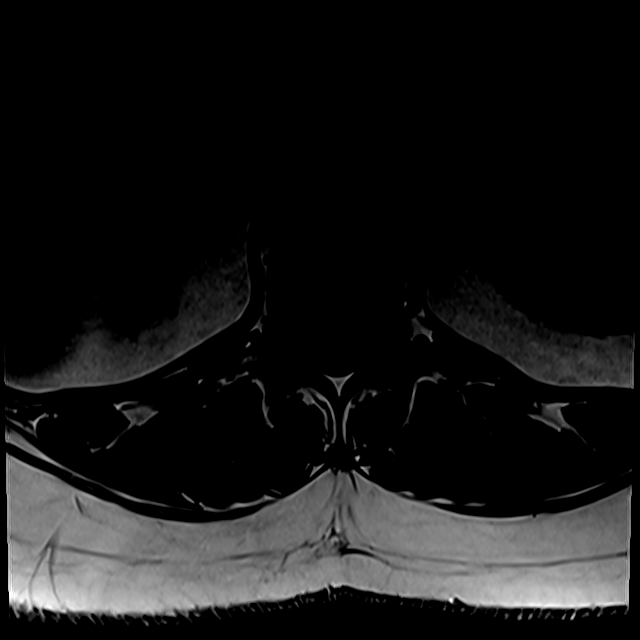

[Series 100: T2 · axial · 4.0mm · 0.28mm/px · z∈[-90,+111]mm · 7 of 42 slices shown (2 of 2)]
[im 1/42]
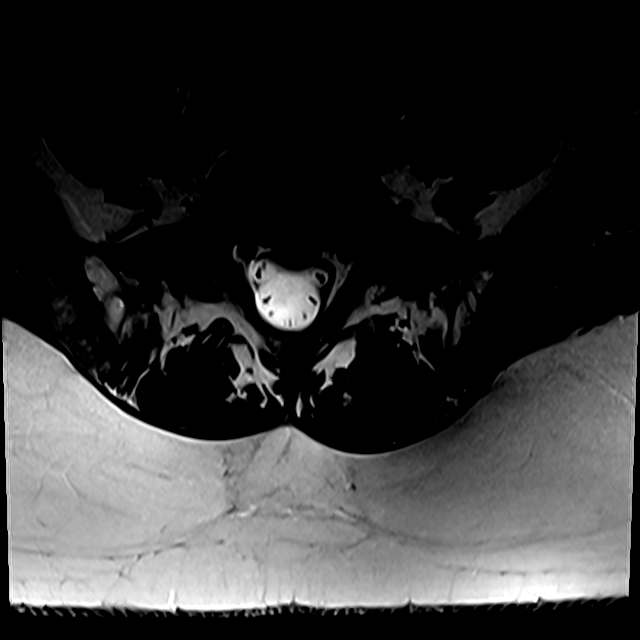
[im 6/42]
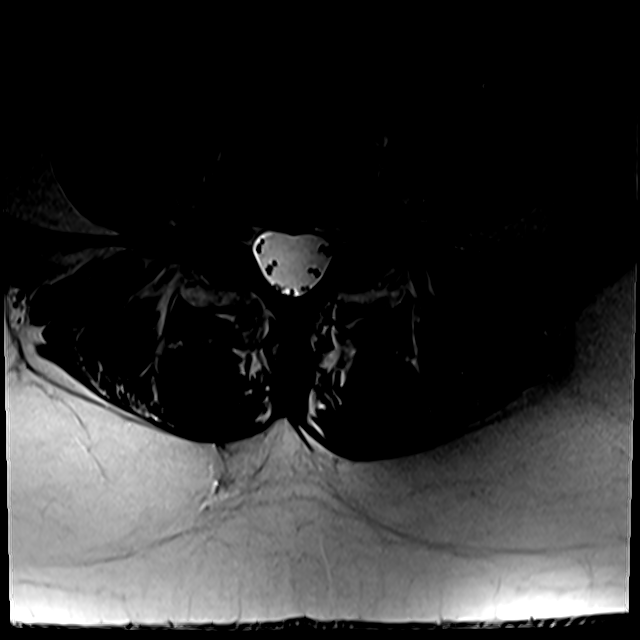
[im 12/42]
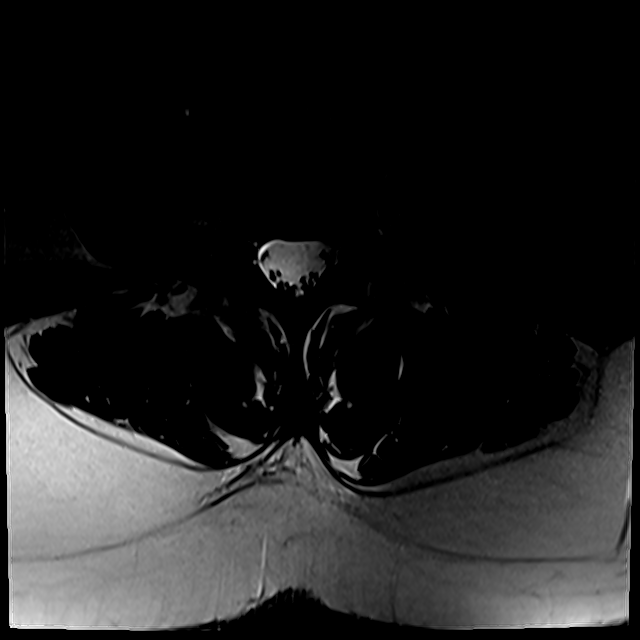
[im 18/42]
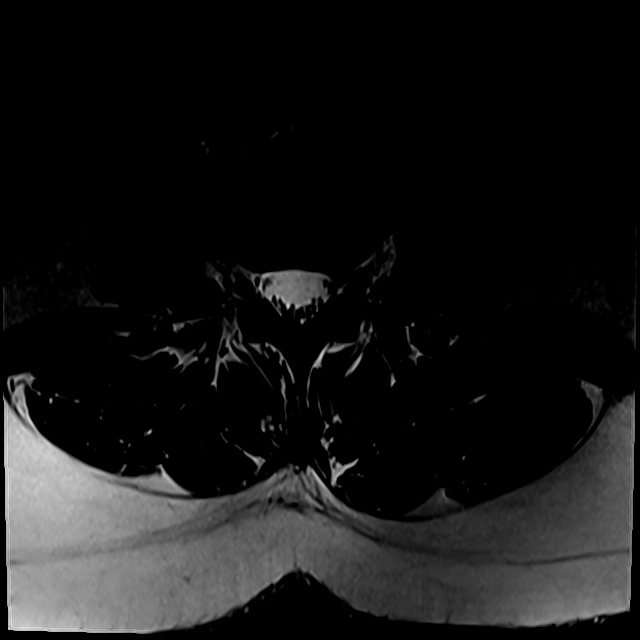
[im 21/42]
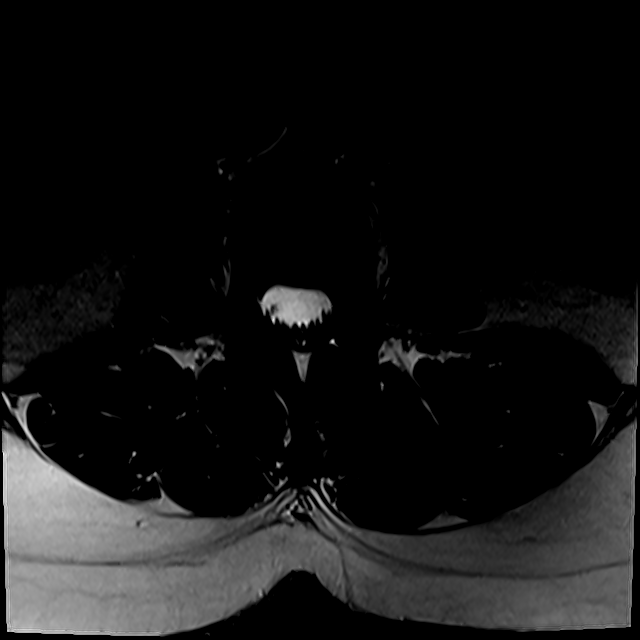
[im 24/42]
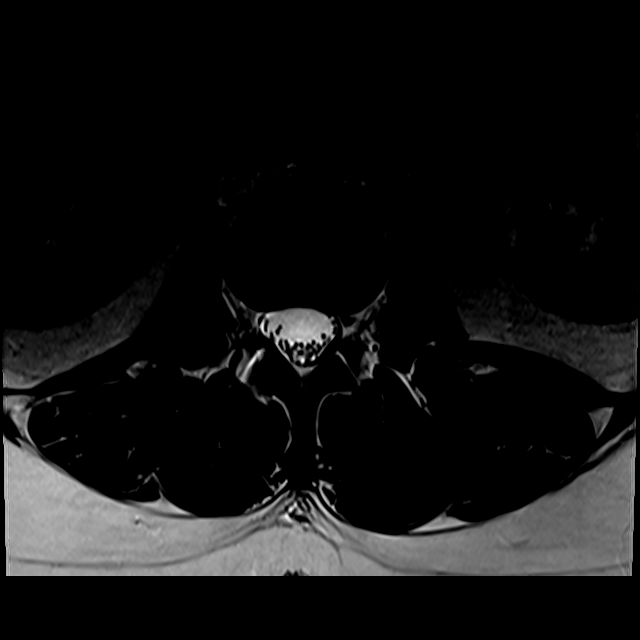
[im 36/42]
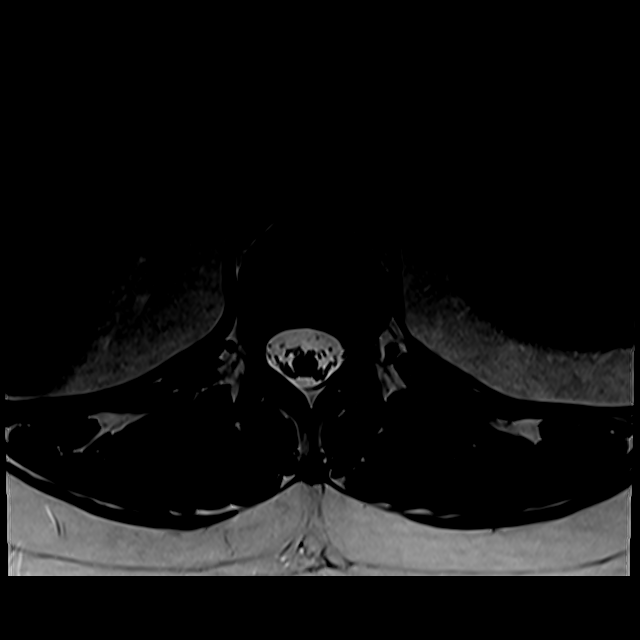

[19 of 48 positions shown; findings below may reference images not displayed]

FINDINGS: Segmentation:  Standard.

Alignment:  Physiologic.

Vertebrae:  No fracture, evidence of discitis, or bone lesion.

Conus medullaris and cauda equina: Conus extends to the L1-2 level.
Conus and cauda equina appear normal.

Paraspinal and other soft tissues: Negative.

Disc levels:

T12 - L1 No disc protrusion. No foraminal stenosis. No central canal
stenosis.

L1 - L2 No disc protrusion. No foraminal stenosis. No central canal
stenosis.

L2 - L3 No disc protrusion. No foraminal stenosis. No central canal
stenosis.

L3 - L4 No disc protrusion. No foraminal stenosis. No central canal
stenosis.

L4 - L5 No disc protrusion. No foraminal stenosis. No central canal
stenosis.

L5 - S1 No disc protrusion. No foraminal stenosis. No central canal
stenosis.
IMPRESSION: Unremarkable MRI of the lumbar spine.

## 2023-08-23 ENCOUNTER — Other Ambulatory Visit: Payer: Self-pay | Admitting: Family Medicine

## 2023-10-01 ENCOUNTER — Emergency Department (HOSPITAL_BASED_OUTPATIENT_CLINIC_OR_DEPARTMENT_OTHER): Payer: Self-pay

## 2023-10-01 ENCOUNTER — Other Ambulatory Visit: Payer: Self-pay

## 2023-10-01 ENCOUNTER — Emergency Department (HOSPITAL_BASED_OUTPATIENT_CLINIC_OR_DEPARTMENT_OTHER)
Admission: EM | Admit: 2023-10-01 | Discharge: 2023-10-01 | Disposition: A | Discharge status: Home / Self Care | Payer: Self-pay | Attending: Emergency Medicine | Admitting: Emergency Medicine

## 2023-10-01 ENCOUNTER — Encounter (HOSPITAL_BASED_OUTPATIENT_CLINIC_OR_DEPARTMENT_OTHER): Payer: Self-pay | Admitting: Emergency Medicine

## 2023-10-01 ENCOUNTER — Emergency Department (HOSPITAL_BASED_OUTPATIENT_CLINIC_OR_DEPARTMENT_OTHER): Payer: Self-pay | Admitting: Radiology

## 2023-10-01 DIAGNOSIS — R079 Chest pain, unspecified: Secondary | ICD-10-CM

## 2023-10-01 DIAGNOSIS — R0789 Other chest pain: Secondary | ICD-10-CM | POA: Insufficient documentation

## 2023-10-01 DIAGNOSIS — M791 Myalgia, unspecified site: Secondary | ICD-10-CM | POA: Insufficient documentation

## 2023-10-01 DIAGNOSIS — M7918 Myalgia, other site: Secondary | ICD-10-CM

## 2023-10-01 DIAGNOSIS — R1012 Left upper quadrant pain: Secondary | ICD-10-CM | POA: Insufficient documentation

## 2023-10-01 DIAGNOSIS — R109 Unspecified abdominal pain: Secondary | ICD-10-CM

## 2023-10-01 LAB — BASIC METABOLIC PANEL
Anion gap: 10 (ref 5–15)
BUN: 10 mg/dL (ref 6–20)
CO2: 23 mmol/L (ref 22–32)
Calcium: 9.2 mg/dL (ref 8.9–10.3)
Chloride: 106 mmol/L (ref 98–111)
Creatinine, Ser: 0.73 mg/dL (ref 0.44–1.00)
GFR, Estimated: >60 mL/min (ref >60)
Glucose, Bld: 142 mg/dL — ABNORMAL HIGH (ref 70–99)
Potassium: 3.9 mmol/L (ref 3.5–5.1)
Sodium: 139 mmol/L (ref 135–145)

## 2023-10-01 LAB — RESP PANEL BY RT-PCR (RSV, FLU A&B, COVID)  RVPGX2
Influenza A by PCR: NEGATIVE
Influenza B by PCR: NEGATIVE
Resp Syncytial Virus by PCR: NEGATIVE
SARS Coronavirus 2 by RT PCR: NEGATIVE

## 2023-10-01 LAB — CK: Total CK: 66 U/L (ref 38–234)

## 2023-10-01 LAB — CBC
HCT: 43.1 % (ref 36.0–46.0)
Hemoglobin: 14.6 g/dL (ref 12.0–15.0)
MCH: 30.9 pg (ref 26.0–34.0)
MCHC: 33.9 g/dL (ref 30.0–36.0)
MCV: 91.3 fL (ref 80.0–100.0)
Platelets: 290 10*3/uL (ref 150–400)
RBC: 4.72 MIL/uL (ref 3.87–5.11)
RDW: 13.5 % (ref 11.5–15.5)
WBC: 13.4 10*3/uL — ABNORMAL HIGH (ref 4.0–10.5)
nRBC: 0 % (ref 0.0–0.2)

## 2023-10-01 LAB — HEPATIC FUNCTION PANEL
ALT: 76 U/L — ABNORMAL HIGH (ref 0–44)
AST: 48 U/L — ABNORMAL HIGH (ref 15–41)
Albumin: 4.7 g/dL (ref 3.5–5.0)
Alkaline Phosphatase: 94 U/L (ref 38–126)
Bilirubin, Direct: 0.1 mg/dL (ref 0.0–0.2)
Indirect Bilirubin: 0.6 mg/dL (ref 0.3–0.9)
Total Bilirubin: 0.7 mg/dL (ref 0.0–1.2)
Total Protein: 7.1 g/dL (ref 6.5–8.1)

## 2023-10-01 LAB — TROPONIN I (HIGH SENSITIVITY)
Troponin I (High Sensitivity): <2 ng/L (ref <18)
Troponin I (High Sensitivity): <2 ng/L (ref <18)

## 2023-10-01 MED ORDER — ALUM & MAG HYDROXIDE-SIMETH 200-200-20 MG/5ML PO SUSP
30.0000 mL | Freq: Once | ORAL | Status: AC
  Administered 2023-10-01: 30 mL via ORAL
  Filled 2023-10-01: qty 30

## 2023-10-01 MED ORDER — CYCLOBENZAPRINE HCL 5 MG PO TABS
5.0000 mg | ORAL_TABLET | Freq: Once | ORAL | Status: AC
  Administered 2023-10-01: 5 mg via ORAL
  Filled 2023-10-01: qty 1

## 2023-10-01 MED ORDER — IOHEXOL 350 MG/ML SOLN
100.0000 mL | Freq: Once | INTRAVENOUS | Status: AC | PRN
Start: 2023-10-01 — End: 2023-10-01
  Administered 2023-10-01: 80 mL via INTRAVENOUS

## 2023-10-01 MED ORDER — LIDOCAINE 5 % EX PTCH: 1.0000 | MEDICATED_PATCH | CUTANEOUS | 0 refills | Status: DC

## 2023-10-01 MED ORDER — MORPHINE SULFATE (PF) 4 MG/ML IV SOLN
4.0000 mg | Freq: Once | INTRAVENOUS | Status: AC
  Administered 2023-10-01: 4 mg via INTRAVENOUS
  Filled 2023-10-01: qty 1

## 2023-10-01 MED ORDER — LIDOCAINE VISCOUS HCL 2 % MT SOLN
15.0000 mL | Freq: Once | OROMUCOSAL | Status: AC
  Administered 2023-10-01: 15 mL via ORAL
  Filled 2023-10-01: qty 15

## 2023-10-01 MED ORDER — KETOROLAC TROMETHAMINE 15 MG/ML IJ SOLN
15.0000 mg | Freq: Once | INTRAMUSCULAR | Status: AC
  Administered 2023-10-01: 15 mg via INTRAVENOUS
  Filled 2023-10-01: qty 1

## 2023-10-01 MED ORDER — CYCLOBENZAPRINE HCL 10 MG PO TABS: 10.0000 mg | ORAL_TABLET | Freq: Two times a day (BID) | ORAL | 0 refills | Status: DC | PRN

## 2023-10-01 MED ORDER — LORAZEPAM 2 MG/ML IJ SOLN
1.0000 mg | Freq: Once | INTRAMUSCULAR | Status: AC
  Administered 2023-10-01: 1 mg via INTRAVENOUS
  Filled 2023-10-01: qty 1

## 2023-10-01 NOTE — Discharge Instructions (Addendum)
 Your EKG, labs and CT imaging was overall reassuring.  Your symptoms are consistent with likely musculoskeletal pain, recommend a muscle relaxant as well as over-the-counter lidocaine patch and Tylenol for pain control.  Follow-up with your PCP to ensure resolution.  If you develop a blistering rash in the same area of pain this would be more consistent with shingles and you would benefit from an antiviral that your PCP can prescribe

## 2023-10-01 NOTE — ED Provider Notes (Signed)
 [place] EMERGENCY DEPARTMENT AT [facility] Provider Note   CSN: [id number] Arrival date & time: 10/01/23  0759     History  Chief Complaint  Patient presents with   Chest Pain    Theresa Sawyer is a 43 y.o. female.   Chest Pain    43 year old female with medical history significant for depression, anxiety, psoriasis presenting to the emergency department with multiple complaints.  The patient states that she has had chronic left-sided flank pain described as muscle spasms and heaviness, worsened over the past 2 days.  She also states that she has chronic intermittent chest discomfort.  She endorses a heaviness or pressure sensation in her chest, had not improved with Pepcid like she had previously thought.  Symptoms wrap around from her back to her left upper quadrant.  No burning sensation, no pain with light touch, no rash.  The patient is tolerating oral intake.  She is passing gas and moving her bowels.  No cough, fevers or chills.  No recent falls or trauma, no weakness or numbness in the lower extremities, no saddle anesthesia.  No urinary or fecal incontinence.  Home Medications Prior to Admission medications   Medication Sig Start Date End Date Taking? Authorizing Provider  cyclobenzaprine (FLEXERIL) 10 MG tablet Take 1 tablet (10 mg total) by mouth 2 (two) times daily as needed for muscle spasms. 10/01/23  Yes Ernie Avena, MD  lidocaine (LIDODERM) 5 % Place 1 patch onto the skin daily. Remove & Discard patch within 12 hours or as directed by MD 10/01/23  Yes Ernie Avena, MD  ALPRAZolam Prudy Feeler) 0.5 MG tablet TAKE 1 TABLET BY MOUTH EVERY DAY AS NEEDED FOR ANXIETY 04/04/21   Georgina Quint, MD  betamethasone dipropionate 0.05 % cream Apply topically 2 (two) times daily. 10/05/22   Park Meo, FNP  buPROPion (WELLBUTRIN SR) 150 MG 12 hr tablet Take 1 tablet (150 mg total) by mouth 2 (two) times daily. Take 1 tablet ONCE daily for first 3 days then increase  to one tablet TWICE daily thereafter 08/23/22   Park Meo, FNP  escitalopram (LEXAPRO) 20 MG tablet TAKE 1 TABLET(20 MG) BY MOUTH DAILY 03/24/23   Georgina Quint, MD  estradiol (VIVELLE-DOT) 0.1 MG/24HR patch Place 1 patch (0.1 mg total) onto the skin 2 (two) times a week. 08/17/22   Olivia Mackie, NP  famotidine (PEPCID) 40 MG tablet TAKE 1 TABLET(40 MG) BY MOUTH TWICE DAILY 05/18/20   Tressia Danas, MD  hydrochlorothiazide (HYDRODIURIL) 25 MG tablet Take 1 tablet (25 mg total) by mouth daily. 08/23/22   Park Meo, FNP  HYDROcodone-acetaminophen (NORCO) 7.5-325 MG tablet Take 1 tablet by mouth 4 (four) times daily as needed.    [provider]  metFORMIN (GLUCOPHAGE-XR) 500 MG 24 hr tablet Take 1 tablet (500 mg total) by mouth 2 (two) times daily with a meal. 08/24/22   Park Meo, FNP  oxyCODONE (OXY IR/ROXICODONE) 5 MG immediate release tablet Take by mouth. 11/01/21   [provider]  oxyCODONE-acetaminophen (PERCOCET/ROXICET) 5-325 MG tablet 1 tablet Orally every 6 hours, if needed for 30 days 11/11/21   [provider]  predniSONE (DELTASONE) 20 MG tablet Take 1 tab PO BID x 5 days, then 1 tab PO daily x 5 days 10/03/22   [provider]  rosuvastatin (CRESTOR) 20 MG tablet Take 0.5 tablets (10 mg total) by mouth daily. 08/24/22   Park Meo, FNP  valsartan (DIOVAN) 80 MG  tablet TAKE 1 AND 1/2 TABLETS(120 MG) BY MOUTH DAILY 08/27/23   Park Meo, FNP  Vitamin D, Ergocalciferol, (DRISDOL) 1.25 MG (50000 UNIT) CAPS capsule TAKE 1 CAPSULE BY MOUTH EVERY 7 DAYS 06/10/21   Georgina Quint, MD      Allergies    Patient has no known allergies.    Review of Systems   Review of Systems  Cardiovascular:  Positive for chest pain.    Physical Exam Updated Vital Signs BP 106/66   Pulse 65   Temp 98.3 F (36.8 C) (Oral)   Resp 14   Ht 5\' 5"  (1.651 m)   Wt 111.1 kg   LMP 09/01/2020   SpO2 99%   BMI 40.77 kg/m  Physical  Exam Vitals and nursing note reviewed.  Constitutional:      General: She is not in acute distress.    Appearance: She is well-developed. She is obese.  HENT:     Head: Normocephalic and atraumatic.  Eyes:     Conjunctiva/sclera: Conjunctivae normal.  Cardiovascular:     Rate and Rhythm: Normal rate and regular rhythm.     Heart sounds: No murmur heard. Pulmonary:     Effort: Pulmonary effort is normal. No respiratory distress.     Breath sounds: Normal breath sounds.  Abdominal:     Palpations: Abdomen is soft.     Tenderness: There is abdominal tenderness in the left upper quadrant.  Musculoskeletal:        General: No swelling.     Cervical back: Neck supple.     Comments: Left upper thoracic back and left lumbar paraspinal MSK TTP, no midline TTP of the cervical, thoracic, or lumbar spine  Skin:    General: Skin is warm and dry.     Capillary Refill: Capillary refill takes less than 2 seconds.  Neurological:     Mental Status: She is alert.     Comments: 5/5 strength and intact sensation bilaterally in the upper and lower extremities  Psychiatric:        Mood and Affect: Mood normal.     ED Results / Procedures / Treatments   Labs (all labs ordered are listed, but only abnormal results are displayed) Labs Reviewed  BASIC METABOLIC PANEL - Abnormal; Notable for the following components:      Result Value   Glucose, Bld 142 (*)    All other components within normal limits  CBC - Abnormal; Notable for the following components:   WBC 13.4 (*)    All other components within normal limits  HEPATIC FUNCTION PANEL - Abnormal; Notable for the following components:   AST 48 (*)    ALT 76 (*)    All other components within normal limits  RESP PANEL BY RT-PCR (RSV, FLU A&B, COVID)  RVPGX2  CK  TROPONIN I (HIGH SENSITIVITY)  TROPONIN I (HIGH SENSITIVITY)    EKG EKG Interpretation Date/Time:  Monday October 01 2023 08:07:39 EDT Ventricular Rate:  77 PR  Interval:  144 QRS Duration:  100 QT Interval:  405 QTC Calculation: 459 R Axis:   45  Text Interpretation: Sinus rhythm No significant change since last tracing Confirmed by Ernie Avena ([id number]) on 10/01/2023 8:56:44 AM  Radiology CT ABDOMEN PELVIS W CONTRAST Result Date: 10/01/2023 CLINICAL DATA:  Abdominal pain, acute, nonlocalized EXAM: CT ABDOMEN AND PELVIS WITH CONTRAST TECHNIQUE: Multidetector CT imaging of the abdomen and pelvis was performed using the standard protocol following bolus administration of intravenous contrast. RADIATION DOSE  REDUCTION: This exam was performed according to the departmental dose-optimization program which includes automated exposure control, adjustment of the mA and/or kV according to patient size and/or use of iterative reconstruction technique. CONTRAST:  80mL OMNIPAQUE IOHEXOL 350 MG/ML SOLN COMPARISON:  10/24/2021 right upper quadrant abdominal ultrasound FINDINGS: Lower chest: No acute abnormality. Hepatobiliary: Marked hepatic steatosis with areas of fatty sparing peripherally in the right liver. Liver is also enlarged measuring 23.3 cm in craniocaudal dimension, coronal image 65/6. No focal hepatic abnormality or biliary obstruction pattern. Hepatic and portal veins are patent. Remote cholecystectomy. Common bile duct nondilated. Pancreas: Unremarkable. No pancreatic ductal dilatation or surrounding inflammatory changes. Spleen: Normal in size without focal abnormality. Anterolateral accessory splenule noted. Adrenals/Urinary Tract: Adrenal glands are unremarkable. Kidneys are normal, without renal calculi, focal lesion, or hydronephrosis. Bladder is unremarkable. Stomach/Bowel: Stomach is within normal limits. Appendix appears normal. No evidence of bowel wall thickening, distention, or inflammatory changes. Vascular/Lymphatic: Aortoiliac bifurcation minor atherosclerosis. No aneurysm, acute vascular finding, dissection, or occlusive process. Mesenteric and  renal vasculature all appear patent. No veno-occlusive finding. No abdominopelvic bulky adenopathy. Reproductive: Status post hysterectomy. No adnexal masses. Other: No abdominal wall hernia or abnormality. No abdominopelvic ascites. Musculoskeletal: No acute or significant osseous findings. IMPRESSION: 1. No acute intra-abdominal or pelvic finding by CT. 2. Hepatomegaly with marked hepatic steatosis. 3. Remote cholecystectomy and hysterectomy. 4. Aortic atherosclerosis. Electronically Signed   By: Judie Petit.  Shick M.D.   On: 10/01/2023 10:09   CT Angio Chest PE W and/or Wo Contrast Result Date: 10/01/2023 CLINICAL DATA:  Acute chest and left flank pain, heartburn symptoms EXAM: CT ANGIOGRAPHY CHEST WITH CONTRAST TECHNIQUE: Multidetector CT imaging of the chest was performed using the standard protocol during bolus administration of intravenous contrast. Multiplanar CT image reconstructions and MIPs were obtained to evaluate the vascular anatomy. RADIATION DOSE REDUCTION: This exam was performed according to the departmental dose-optimization program which includes automated exposure control, adjustment of the mA and/or kV according to patient size and/or use of iterative reconstruction technique. CONTRAST:  80mL OMNIPAQUE IOHEXOL 350 MG/ML SOLN COMPARISON:  10/01/2023 chest x-ray FINDINGS: Cardiovascular: Satisfactory opacification of the pulmonary arteries to the segmental level. No evidence of pulmonary embolism. Normal heart size. No pericardial effusion. Mediastinum/Nodes: No enlarged mediastinal, hilar, or axillary lymph nodes. Trachea and esophagus demonstrate no significant findings. Left inferior thyroid hypodense 1.8 cm nodule noted best demonstrated on the coronal image 83/5 Lungs/Pleura: Lungs are clear. No pleural effusion or pneumothorax. Minor dependent bibasilar hypoventilatory changes. Upper Abdomen: Diffuse hepatic steatosis noted. No acute upper abdominal finding. Musculoskeletal: No chest wall  abnormality. No acute or significant osseous findings. Review of the MIP images confirms the above findings. IMPRESSION: 1. No evidence of pulmonary embolism or other acute intrathoracic finding. 2. Hepatic steatosis. 3. 1.8 cm left thyroid nodule. Recommend thyroid US (ref: J Am Coll Radiol. 2015 Feb;12(2): 143-50). Electronically Signed   By: Judie Petit.  Shick M.D.   On: 10/01/2023 10:02   DG Chest 2 View Result Date: 10/01/2023 CLINICAL DATA:  Chest pain. EXAM: CHEST - 2 VIEW COMPARISON:  10/24/2021. FINDINGS: Low lung volume. Bilateral lung fields are clear. Bilateral costophrenic angles are clear. Normal cardio-mediastinal silhouette. No acute osseous abnormalities. The soft tissues are within normal limits. IMPRESSION: No active cardiopulmonary disease. Electronically Signed   By: Jules Schick M.D.   On: 10/01/2023 08:49    Procedures Procedures    Medications Ordered in ED Medications  LORazepam (ATIVAN) injection 1 mg (1 mg Intravenous Given 10/01/23 0948)  morphine (PF) 4 MG/ML injection 4 mg (4 mg Intravenous Given 10/01/23 0948)  iohexol (OMNIPAQUE) 350 MG/ML injection 100 mL (80 mLs Intravenous Contrast Given 10/01/23 0935)  ketorolac (TORADOL) 15 MG/ML injection 15 mg (15 mg Intravenous Given 10/01/23 1042)  cyclobenzaprine (FLEXERIL) tablet 5 mg (5 mg Oral Given 10/01/23 1058)  alum & mag hydroxide-simeth (MAALOX/MYLANTA) 200-200-20 MG/5ML suspension 30 mL (30 mLs Oral Given 10/01/23 1058)    And  lidocaine (XYLOCAINE) 2 % viscous mouth solution 15 mL (15 mLs Oral Given 10/01/23 1058)    ED Course/ Medical Decision Making/ A&P                                 Medical Decision Making Amount and/or Complexity of Data Reviewed Labs: ordered. Radiology: ordered.  Risk OTC drugs. Prescription drug management.    43 year old female with medical history significant for depression, anxiety, psoriasis presenting to the emergency department with multiple complaints.  The patient states that  she has had chronic left-sided flank pain described as muscle spasms and heaviness, worsened over the past 2 days.  She also states that she has chronic intermittent chest discomfort.  She endorses a heaviness or pressure sensation in her chest, had not improved with Pepcid like she had previously thought.  Symptoms wrap around from her back to her left upper quadrant.  No burning sensation, no pain with light touch, no rash.  The patient is tolerating oral intake.  She is passing gas and moving her bowels.  No cough, fevers or chills. No recent falls or trauma, no weakness or numbness in the lower extremities, no saddle anesthesia.  No urinary or fecal incontinence.  On arrival, the patient was vitally stable, afebrile, not tachycardic or tachypneic, hemodynamically stable, saturating well on room air.  Patient presenting with nonspecific vague chronic left-sided flank pain and chest pain.  Differential diagnosis includes musculoskeletal pain, peptic ulcer, pneumothorax, PE, gastritis, less likely ACS, less likely pneumonia, less likely viral infection, less likely other intra-abdominal abnormality like bowel obstruction, intra-abdominal infectious etiology, nephrolithiasis/pyelonephritis.  IV access was obtained and the patient was administered IV morphine, IV Ativan and IV Toradol.  Laboratory evaluation revealed hepatic function panel with mildly elevated LFTs, CBC with a nonspecific leukocytosis of 13.4, BMP unremarkable, COVID, Flu, RSV PCR testing negative, CK normal, troponins x 2 negative.  CTA PE: IMPRESSION:  1. No evidence of pulmonary embolism or other acute intrathoracic  finding.  2. Hepatic steatosis.  3. 1.8 cm left thyroid nodule. Recommend thyroid US (ref: J Am Coll  Radiol. 2015 Feb;12(2): 143-50).    CT Abd Pelvis: IMPRESSION:  1. No acute intra-abdominal or pelvic finding by CT.  2. Hepatomegaly with marked hepatic steatosis.  3. Remote cholecystectomy and hysterectomy.   4. Aortic atherosclerosis.   Patient also administered Flexeril, Maalox and lidocaine.  She had subsequent improvement in her symptoms.  I suspect likely musculoskeletal etiology of the patient's presentation given her left-sided back pain, worse with twisting and movement, flank pain radiation.  I counseled her to watch for development of rash which would indicate shingles with no evidence of rash seen on exam today.  Will discharge with treatment for musculoskeletal etiology with lidocaine patch and Flexeril.  Return precautions provided, patient overall stable for discharge and outpatient follow-up.    Final Clinical Impression(s) / ED Diagnoses Final diagnoses:  Musculoskeletal pain  Chest pain, unspecified type  Flank pain  Rx / DC Orders ED Discharge Orders          Ordered    lidocaine (LIDODERM) 5 %  Every 24 hours        10/01/23 1135    cyclobenzaprine (FLEXERIL) 10 MG tablet  2 times daily PRN        10/01/23 1135              Ernie Avena, MD 10/01/23 1414

## 2023-10-01 NOTE — ED Triage Notes (Signed)
 Pt caox4, ambulatory, NAD c/o pain in chest and L flank described as feeling like "muscle spasms or heaviness" x2 days. Pt reports she initially thought it was heartburn but that it hasn't gone away with pepcid like it normally does.

## 2023-10-10 ENCOUNTER — Ambulatory Visit: Payer: Self-pay | Admitting: Family Medicine

## 2023-10-10 ENCOUNTER — Encounter: Payer: Self-pay | Admitting: Family Medicine

## 2023-10-10 VITALS — BP 132/76 | HR 83 | Temp 98.2°F | Ht 65.0 in | Wt 249.4 lb

## 2023-10-10 DIAGNOSIS — I10 Essential (primary) hypertension: Secondary | ICD-10-CM

## 2023-10-10 DIAGNOSIS — K2100 Gastro-esophageal reflux disease with esophagitis, without bleeding: Secondary | ICD-10-CM | POA: Diagnosis not present

## 2023-10-10 DIAGNOSIS — Z1231 Encounter for screening mammogram for malignant neoplasm of breast: Secondary | ICD-10-CM

## 2023-10-10 DIAGNOSIS — I700 Atherosclerosis of aorta: Secondary | ICD-10-CM | POA: Diagnosis not present

## 2023-10-10 DIAGNOSIS — E041 Nontoxic single thyroid nodule: Secondary | ICD-10-CM | POA: Diagnosis not present

## 2023-10-10 DIAGNOSIS — R109 Unspecified abdominal pain: Secondary | ICD-10-CM | POA: Diagnosis not present

## 2023-10-10 DIAGNOSIS — R10A2 Flank pain, left side: Secondary | ICD-10-CM | POA: Insufficient documentation

## 2023-10-10 DIAGNOSIS — Z13220 Encounter for screening for lipoid disorders: Secondary | ICD-10-CM

## 2023-10-10 DIAGNOSIS — Z0000 Encounter for general adult medical examination without abnormal findings: Secondary | ICD-10-CM

## 2023-10-10 DIAGNOSIS — E785 Hyperlipidemia, unspecified: Secondary | ICD-10-CM

## 2023-10-10 DIAGNOSIS — F411 Generalized anxiety disorder: Secondary | ICD-10-CM

## 2023-10-10 DIAGNOSIS — Z0001 Encounter for general adult medical examination with abnormal findings: Secondary | ICD-10-CM | POA: Diagnosis not present

## 2023-10-10 DIAGNOSIS — F32A Depression, unspecified: Secondary | ICD-10-CM

## 2023-10-10 DIAGNOSIS — F17210 Nicotine dependence, cigarettes, uncomplicated: Secondary | ICD-10-CM

## 2023-10-10 LAB — URINALYSIS, ROUTINE W REFLEX MICROSCOPIC
Bilirubin Urine: NEGATIVE
Glucose, UA: NEGATIVE
Hgb urine dipstick: NEGATIVE
Ketones, ur: NEGATIVE
Leukocytes,Ua: NEGATIVE
Nitrite: NEGATIVE
Protein, ur: NEGATIVE
Specific Gravity, Urine: 1.015 (ref 1.001–1.035)
pH: 5.5 (ref 5.0–8.0)

## 2023-10-10 NOTE — Progress Notes (Signed)
 Subjective:  HPI: Theresa Sawyer is a 43 y.o. female presenting on 10/10/2023 for Hospitalization Follow-up (Seen in ER on 10/01/23, [name], dx Chest pain. Pt states she was told she had a nodule on thyroid and spleen. Pt states she is also having R side pain and increased heartburn.)   HPI Patient is in today for ED follow up and CPE. Theresa Sawyer was seen at [facility] ED on 3/24 for chest pain and left sided abdominal/flank pain. Workup included CBC, BMP, troponins, resp viral panel, EKG, CT  abdomen pelvis, CXR, and CT angio. No findings to explain her pain. LFTs elevated, CBC 13.4Incidental findings included left thyroid nodule 1.8cm, aortic atherosclerosis, hepatic steatosis. Presumed MSK etiology DC with lidocaine patch and flexeril. Today Theresa Sawyer endorses ongoing left sided pain and swelling that wraps around from her LUQ to her left flank with some improvement with lidocaine patches and Flexeril.  She also attributes most of her symptoms to worsening GERD. She endorses reflux and regurgitation, epigastric pain, associated substernal pain. Uncontrolled with Pepcid 40mg  BID. BMs are soft, formed, every other day. No hematochezia or melena, vomiting, diarrhea, dysphagia. She denies dysuria, urinary frequency, or hematuria.  Theresa Sawyer would like a full physical with labs today. She eats a regular diet and does not exercise regularly. Has had trouble managing her weight. Is not currently seeing Rheumatology for her psoriasis due to insurance but this has been well controlled until this week. Anxiety and depression are well controlled on Lexapro. She does continue to smoke but has cut back. Is interested in quitting. History of hysterectomy. Needs a mammogram. Needs pneumonia and tdap however declines today, will return when she is feeling better.   Health Maintenance  Topic Date Due   Pneumococcal Vaccine 69-49 Years old (1 of 2 - PCV) Never done   MAMMOGRAM  Never done    DTaP/Tdap/Td (2 - Td or Tdap) 02/07/2023   COVID-19 Vaccine (1) 10/26/2023 (Originally 06/16/1986)   INFLUENZA VACCINE  02/08/2024   Hepatitis C Screening  Completed   HIV Screening  Completed   HPV VACCINES  Aged Out     Review of Systems  Constitutional:  Positive for malaise/fatigue.  HENT: Negative.    Eyes: Negative.   Respiratory: Negative.    Cardiovascular: Negative.   Gastrointestinal:  Positive for abdominal pain and heartburn.  Genitourinary: Negative.   Musculoskeletal: Negative.   Skin:  Positive for rash.  Neurological: Negative.   Endo/Heme/Allergies: Negative.   Psychiatric/Behavioral: Negative.    All other systems reviewed and are negative.   Relevant past medical history reviewed and updated as indicated.   Past Medical History:  Diagnosis Date   Abnormal uterine bleeding (AUB)    Anxiety    Arthritis    oa   Depression    GERD (gastroesophageal reflux disease)    History of chest pain (10-20-2019  pt denies any cardiac s&s)   mutliple ED visits, atypical chest pain, chest wall pain, muscularskeletal chest pain;  pt had cardiology evaulation dated 04-23-2018 note in epic , state atypical chest wall pain with negative d dimer and troponin's multiple times in epic, suggested CTA  (pt did not get done)   Hyperlipidemia    Hypertension    IDA (iron deficiency anemia)    none recently   Menorrhagia    Migraine    OSA on CPAP    followed by dr ather cpap set on 7 to 14   Psoriasis  Urinary frequency      Past Surgical History:  Procedure Laterality Date   CHOLECYSTECTOMY N/A 10/25/2021   Procedure: LAPAROSCOPIC CHOLECYSTECTOMY with INTRAOPERATIVE CHOLANGIOGRAM;  Surgeon: Theresa Soda, MD;  Location: [facility];  Service: General;  Laterality: N/A;   DILATATION & CURETTAGE/HYSTEROSCOPY WITH MYOSURE N/A 10/24/2019   Procedure: DILATATION & CURETTAGE/HYSTEROSCOPY WITH MYOSURE;  Surgeon: Theresa Majors, MD;  Location: [facility];  Service:  Gynecology;  Laterality: N/A;   HYSTEROSCOPY WITH NOVASURE N/A 04/06/2020   Procedure: DILATION AND CURRETTAGE; NOVASURE ABLATION;  Surgeon: Theresa Majors, MD;  Location: [facility];  Service: Gynecology;  Laterality: N/A;   LAPAROSCOPY N/A 04/06/2020   Procedure: LAPAROSCOPY OPERATIVE; LYSIS OF ADHESION;  Surgeon: Theresa Majors, MD;  Location: [facility];  Service: Gynecology;  Laterality: N/A;   ROBOTIC ASSISTED TOTAL HYSTERECTOMY WITH BILATERAL SALPINGO OOPHERECTOMY Bilateral 09/28/2020   Procedure: XI ROBOTIC ASSISTED TOTAL LAPAROSCOPIC HYSTERECTOMY WITH BILATERAL SALPINGO OOPHORECTOMY;  Surgeon: Theresa Del, MD;  Location: [facility] [place];  Service: Gynecology;  Laterality: Bilateral;  request 8:30am OR start time in IQUEUE held time for Dr. Seymour Bars requests 2 hours KATHY CONFIRMED ON 2/10 W/NICK FOR  Theresa Sawyer (RNFA) TO ASSIST   TUBAL LIGATION Bilateral 02/05/2013   Procedure: POST PARTUM TUBAL LIGATION;  Surgeon: Theresa Phenix, MD;  Location: [facility];  Service: Gynecology;  Laterality: Bilateral;    Filshie clips    Allergies and medications reviewed and updated.   Current Outpatient Medications:    cyclobenzaprine (FLEXERIL) 10 MG tablet, Take 1 tablet (10 mg total) by mouth 2 (two) times daily as needed for muscle spasms., Disp: 20 tablet, Rfl: 0   escitalopram (LEXAPRO) 20 MG tablet, TAKE 1 TABLET(20 MG) BY MOUTH DAILY, Disp: 90 tablet, Rfl: 3   estradiol (VIVELLE-DOT) 0.1 MG/24HR patch, Place 1 patch (0.1 mg total) onto the skin 2 (two) times a week., Disp: 24 patch, Rfl: 0   famotidine (PEPCID) 40 MG tablet, TAKE 1 TABLET(40 MG) BY MOUTH TWICE DAILY, Disp: 60 tablet, Rfl: 3   hydrochlorothiazide (HYDRODIURIL) 25 MG tablet, Take 1 tablet (25 mg total) by mouth daily., Disp: 90 tablet, Rfl: 3   lidocaine (LIDODERM) 5 %, Place 1 patch onto the skin daily. Remove & Discard patch within 12 hours or as directed by MD, Disp: 30 patch, Rfl: 0    metFORMIN (GLUCOPHAGE-XR) 500 MG 24 hr tablet, Take 1 tablet (500 mg total) by mouth 2 (two) times daily with a meal., Disp: 180 tablet, Rfl: 3   oxyCODONE (OXY IR/ROXICODONE) 5 MG immediate release tablet, Take by mouth., Disp: , Rfl:    rosuvastatin (CRESTOR) 20 MG tablet, Take 0.5 tablets (10 mg total) by mouth daily., Disp: 90 tablet, Rfl: 3   valsartan (DIOVAN) 80 MG tablet, TAKE 1 AND 1/2 TABLETS(120 MG) BY MOUTH DAILY, Disp: 60 tablet, Rfl: 0   Vitamin D, Ergocalciferol, (DRISDOL) 1.25 MG (50000 UNIT) CAPS capsule, TAKE 1 CAPSULE BY MOUTH EVERY 7 DAYS, Disp: 5 capsule, Rfl: 3  No Known Allergies  Objective:   BP 132/76   Pulse 83   Temp 98.2 F (36.8 C)   Ht 5\' 5"  (1.651 m)   Wt 249 lb 6.4 oz (113.1 kg)   LMP 09/01/2020   SpO2 99%   BMI 41.50 kg/m      10/10/2023    8:15 AM 10/01/2023   11:15 AM 10/01/2023   10:45 AM  Vitals with BMI  Height 5\' 5"     Weight 249  lbs 6 oz    BMI 41.5    Systolic 132 106 94  Diastolic 76 66 62  Pulse 83 65 63     Physical Exam Vitals and nursing note reviewed.  Constitutional:      Appearance: Normal appearance. She is obese.  HENT:     Head: Normocephalic and atraumatic.     Right Ear: Tympanic membrane, ear canal and external ear normal.     Left Ear: Tympanic membrane, ear canal and external ear normal.     Nose: Nose normal.     Mouth/Throat:     Mouth: Mucous membranes are moist.     Pharynx: Oropharynx is clear.  Eyes:     Extraocular Movements: Extraocular movements intact.     Conjunctiva/sclera: Conjunctivae normal.     Pupils: Pupils are equal, round, and reactive to light.  Cardiovascular:     Rate and Rhythm: Normal rate and regular rhythm.     Pulses: Normal pulses.     Heart sounds: Normal heart sounds.  Pulmonary:     Effort: Pulmonary effort is normal.     Breath sounds: Normal breath sounds.  Abdominal:     General: Bowel sounds are normal.     Palpations: Abdomen is soft.     Tenderness: There is  abdominal tenderness in the left upper quadrant. There is left CVA tenderness.  Musculoskeletal:        General: Normal range of motion.     Cervical back: Normal range of motion and neck supple.  Skin:    General: Skin is warm and dry.     Capillary Refill: Capillary refill takes less than 2 seconds.     Findings: Rash present. Rash is scaling.          Comments: White scaling patches to bilateral knees and elbows  Neurological:     General: No focal deficit present.     Mental Status: She is alert and oriented to person, place, and time. Mental status is at baseline.  Psychiatric:        Mood and Affect: Mood normal.        Behavior: Behavior normal.        Thought Content: Thought content normal.        Judgment: Judgment normal.     Assessment & Plan:  Physical exam, annual Assessment & Plan: Today your medical history was reviewed and routine physical exam with labs was performed. Recommend 150 minutes of moderate intensity exercise weekly and consuming a well-balanced diet. Advised to stop smoking if a smoker, avoid smoking if a non-smoker, limit alcohol consumption to 1 drink per day for women and 2 drinks per day for men, and avoid illicit drug use. Counseled in mental health awareness and when to seek medical care. Vaccine maintenance discussed. Appropriate health maintenance items reviewed. Return to office in 1 year for annual physical exam.   Orders: -     CBC with Differential/Platelet -     Comprehensive metabolic panel with GFR -     Lipid panel -     Thyroid Panel With TSH -     Hemoglobin A1c -     VITAMIN D 25 Hydroxy (Vit-D Deficiency, Fractures)  Aortic atherosclerosis (HCC) Assessment & Plan: Discussed importance of control of weight, HTN, HLD, DM. Fasting labs today. BP well controlled.    Gastroesophageal reflux disease with esophagitis without hemorrhage Assessment & Plan: Uncontrolled. Pepcid 40mg  BID. Referral to GI for Endoscopy, possible  colonoscopy.  The pathophysiology of reflux is discussed.  Anti-reflux measures such as raising the head of the bed, avoiding tight clothing or belts, avoiding eating late at night and not lying down shortly after mealtime and achieving weight loss are discussed. Avoid ASA, NSAID's, caffeine, peppermints, alcohol and tobacco. She should alert me if there are persistent symptoms, dysphagia, weight loss or GI bleeding.   Orders: -     Ambulatory referral to Gastroenterology  Thyroid nodule Assessment & Plan: TSH today, thyroid US ordered. Nodule noted on 10/01/23 CT neck angio  Orders: -     US THYROID; Future  Screening for lipoid disorders -     Lipid panel  Left flank pain Assessment & Plan: CT negative. UA today negative. Referred to GI for uncontrolled GERD. Continue flexeril and lidocaine. Return to office if symptoms worsen or continue.   Orders: -     Urinalysis, Routine w reflex microscopic  Encounter for screening mammogram for malignant neoplasm of breast -     Digital Screening Mammogram, Left and Right; Future  Morbid obesity (HCC) Assessment & Plan: Counseled on importance of weight management for overall health. Encouraged low calorie, heart healthy diet and moderate intensity exercise 150 minutes weekly. This is 3-5 times weekly for 30-50 minutes each session. Goal should be pace of 3 miles/hours, or walking 1.5 miles in 30 minutes and include strength training.    Generalized anxiety disorder Assessment & Plan: Chronic well controlled on Lexapro 20mg  daily. Denies SI/HI.    Depression, unspecified depression type Assessment & Plan: Chronic well controlled on Lexapro 20mg  daily. Denies SI/HI.    Essential hypertension Assessment & Plan: Well controlled. Fasting labs today. Continue Valsartan 80mg  daily and hydrochlorothiazide 25mg  daily. Recommend heart healthy diet such as Mediterranean diet with whole grains, fruits, vegetable, fish, lean meats, nuts, and olive  oil. Limit salt. Encouraged moderate walking, 3-5 times/week for 30-50 minutes each session. Aim for at least 150 minutes.week. Goal should be pace of 3 miles/hours, or walking 1.5 miles in 30 minutes. Avoid tobacco products. Avoid excess alcohol. Take medications as prescribed and bring medications and blood pressure log with cuff to each office visit. Seek medical care for chest pain, palpitations, shortness of breath with exertion, dizziness/lightheadedness, vision changes, recurrent headaches, or swelling of extremities.    Tobacco dependence due to cigarettes Assessment & Plan: 3-5 minute discussion regarding the harms of tobacco use, the benefits of cessation, and methods of cessation. Discussed that there are medication options to help with cessation. Provided printed education on steps to quit smoking. Offered medication. Has tried Wellbutrin in past.    Dyslipidemia Assessment & Plan: Continue Crestor. Fasting labs today. Your labs showed elevated cholesterol. I recommend consuming a heart healthy diet such as Mediterranean diet or DASH diet with whole grains, fruits, vegetable, fish, lean meats, nuts, and olive oil. Limit sweets and processed foods. I also encourage moderate intensity exercise 150 minutes weekly. This is 3-5 times weekly for 30-50 minutes each session. Goal should be pace of 3 miles/hours, or walking 1.5 miles in 30 minutes. The 10-year ASCVD risk score (Arnett DK, et al., 2019) is: 6%       Follow up plan: Return in about 2 weeks (around 10/24/2023) for follow-up.  Park Meo, FNP

## 2023-10-10 NOTE — Assessment & Plan Note (Signed)
 CT negative. UA today negative. Referred to GI for uncontrolled GERD. Continue flexeril and lidocaine. Return to office if symptoms worsen or continue.

## 2023-10-10 NOTE — Assessment & Plan Note (Signed)
 Discussed importance of control of weight, HTN, HLD, DM. Fasting labs today. BP well controlled.

## 2023-10-10 NOTE — Assessment & Plan Note (Signed)
 Chronic well controlled on Lexapro 20mg  daily. Denies SI/HI.

## 2023-10-10 NOTE — Assessment & Plan Note (Signed)
 Uncontrolled. Pepcid 40mg  BID. Referral to GI for Endoscopy, possible colonoscopy. The pathophysiology of reflux is discussed.  Anti-reflux measures such as raising the head of the bed, avoiding tight clothing or belts, avoiding eating late at night and not lying down shortly after mealtime and achieving weight loss are discussed. Avoid ASA, NSAID's, caffeine, peppermints, alcohol and tobacco. She should alert me if there are persistent symptoms, dysphagia, weight loss or GI bleeding.

## 2023-10-10 NOTE — Assessment & Plan Note (Signed)

## 2023-10-10 NOTE — Assessment & Plan Note (Addendum)
 3-5 minute discussion regarding the harms of tobacco use, the benefits of cessation, and methods of cessation. Discussed that there are medication options to help with cessation. Provided printed education on steps to quit smoking. Offered medication. Has tried Wellbutrin in past.

## 2023-10-10 NOTE — Assessment & Plan Note (Addendum)
 TSH today, thyroid US ordered. Nodule noted on 10/01/23 CT neck angio

## 2023-10-10 NOTE — Assessment & Plan Note (Signed)
 Continue Crestor. Fasting labs today. Your labs showed elevated cholesterol. I recommend consuming a heart healthy diet such as Mediterranean diet or DASH diet with whole grains, fruits, vegetable, fish, lean meats, nuts, and olive oil. Limit sweets and processed foods. I also encourage moderate intensity exercise 150 minutes weekly. This is 3-5 times weekly for 30-50 minutes each session. Goal should be pace of 3 miles/hours, or walking 1.5 miles in 30 minutes. The 10-year ASCVD risk score (Arnett DK, et al., 2019) is: 6%

## 2023-10-10 NOTE — Assessment & Plan Note (Signed)
 Well controlled. Fasting labs today. Continue Valsartan 80mg  daily and hydrochlorothiazide 25mg  daily. Recommend heart healthy diet such as Mediterranean diet with whole grains, fruits, vegetable, fish, lean meats, nuts, and olive oil. Limit salt. Encouraged moderate walking, 3-5 times/week for 30-50 minutes each session. Aim for at least 150 minutes.week. Goal should be pace of 3 miles/hours, or walking 1.5 miles in 30 minutes. Avoid tobacco products. Avoid excess alcohol. Take medications as prescribed and bring medications and blood pressure log with cuff to each office visit. Seek medical care for chest pain, palpitations, shortness of breath with exertion, dizziness/lightheadedness, vision changes, recurrent headaches, or swelling of extremities.

## 2023-10-10 NOTE — Assessment & Plan Note (Signed)
 Counseled on importance of weight management for overall health. Encouraged low calorie, heart healthy diet and moderate intensity exercise 150 minutes weekly. This is 3-5 times weekly for 30-50 minutes each session. Goal should be pace of 3 miles/hours, or walking 1.5 miles in 30 minutes and include strength training.

## 2023-10-11 LAB — LIPID PANEL
Cholesterol: 124 mg/dL (ref <200)
HDL: 34 mg/dL — ABNORMAL LOW (ref >=50)
LDL Cholesterol (Calc): 56 mg/dL
Non-HDL Cholesterol (Calc): 90 mg/dL (ref <130)
Total CHOL/HDL Ratio: 3.6 (calc) (ref <5.0)
Triglycerides: 291 mg/dL — ABNORMAL HIGH (ref <150)

## 2023-10-11 LAB — CBC WITH DIFFERENTIAL/PLATELET
Absolute Lymphocytes: 3976 {cells}/uL — ABNORMAL HIGH (ref 850–3900)
Absolute Monocytes: 1134 {cells}/uL — ABNORMAL HIGH (ref 200–950)
Basophils Absolute: 140 {cells}/uL (ref 0–200)
Basophils Relative: 1 %
Eosinophils Absolute: 406 {cells}/uL (ref 15–500)
Eosinophils Relative: 2.9 %
HCT: 41.9 % (ref 35.0–45.0)
Hemoglobin: 14.1 g/dL (ref 11.7–15.5)
MCH: 31.3 pg (ref 27.0–33.0)
MCHC: 33.7 g/dL (ref 32.0–36.0)
MCV: 93.1 fL (ref 80.0–100.0)
MPV: 9.5 fL (ref 7.5–12.5)
Monocytes Relative: 8.1 %
Neutro Abs: 8344 {cells}/uL — ABNORMAL HIGH (ref 1500–7800)
Neutrophils Relative %: 59.6 %
Platelets: 316 10*3/uL (ref 140–400)
RBC: 4.5 10*6/uL (ref 3.80–5.10)
RDW: 13.6 % (ref 11.0–15.0)
Total Lymphocyte: 28.4 %
WBC: 14 10*3/uL — ABNORMAL HIGH (ref 3.8–10.8)

## 2023-10-11 LAB — COMPREHENSIVE METABOLIC PANEL WITH GFR
AG Ratio: 2.1 (calc) (ref 1.0–2.5)
ALT: 58 U/L — ABNORMAL HIGH (ref 6–29)
AST: 31 U/L — ABNORMAL HIGH (ref 10–30)
Albumin: 4.6 g/dL (ref 3.6–5.1)
Alkaline phosphatase (APISO): 101 U/L (ref 31–125)
BUN: 13 mg/dL (ref 7–25)
CO2: 24 mmol/L (ref 20–32)
Calcium: 9.5 mg/dL (ref 8.6–10.2)
Chloride: 105 mmol/L (ref 98–110)
Creat: 0.73 mg/dL (ref 0.50–0.99)
Globulin: 2.2 g/dL (ref 1.9–3.7)
Glucose, Bld: 105 mg/dL — ABNORMAL HIGH (ref 65–99)
Potassium: 4 mmol/L (ref 3.5–5.3)
Sodium: 139 mmol/L (ref 135–146)
Total Bilirubin: 0.4 mg/dL (ref 0.2–1.2)
Total Protein: 6.8 g/dL (ref 6.1–8.1)
eGFR: 104 mL/min/{1.73_m2} (ref >=60)

## 2023-10-11 LAB — HEMOGLOBIN A1C
Hgb A1c MFr Bld: 5.9 %{Hb} — ABNORMAL HIGH (ref <5.7)
Mean Plasma Glucose: 123 mg/dL
eAG (mmol/L): 6.8 mmol/L

## 2023-10-11 LAB — THYROID PANEL WITH TSH
Free Thyroxine Index: 2.1 (ref 1.4–3.8)
T3 Uptake: 27 % (ref 22–35)
T4, Total: 7.6 ug/dL (ref 5.1–11.9)
TSH: 1.07 m[IU]/L

## 2023-10-11 LAB — VITAMIN D 25 HYDROXY (VIT D DEFICIENCY, FRACTURES): Vit D, 25-Hydroxy: 35 ng/mL (ref 30–100)

## 2023-10-12 ENCOUNTER — Other Ambulatory Visit: Payer: Self-pay | Admitting: Family Medicine

## 2023-10-31 ENCOUNTER — Ambulatory Visit: Admitting: Family Medicine

## 2023-11-06 ENCOUNTER — Other Ambulatory Visit: Payer: Self-pay | Admitting: Family Medicine

## 2023-11-06 ENCOUNTER — Ambulatory Visit (INDEPENDENT_AMBULATORY_CARE_PROVIDER_SITE_OTHER): Admitting: Family Medicine

## 2023-11-06 ENCOUNTER — Encounter: Payer: Self-pay | Admitting: Family Medicine

## 2023-11-06 VITALS — BP 148/92 | HR 68 | Temp 98.2°F | Ht 65.0 in | Wt 252.1 lb

## 2023-11-06 DIAGNOSIS — L409 Psoriasis, unspecified: Secondary | ICD-10-CM | POA: Diagnosis not present

## 2023-11-06 DIAGNOSIS — M150 Primary generalized (osteo)arthritis: Secondary | ICD-10-CM | POA: Diagnosis not present

## 2023-11-06 DIAGNOSIS — R748 Abnormal levels of other serum enzymes: Secondary | ICD-10-CM | POA: Diagnosis not present

## 2023-11-06 NOTE — Assessment & Plan Note (Signed)
Referral to rheumatology

## 2023-11-06 NOTE — Assessment & Plan Note (Signed)
 Pain and swelling to joints of wrists, ankles, and both hands. Refer back to rheum. Obtained crp, esr, ccp, rf today.Using Oxycodone  for pain mangement.

## 2023-11-06 NOTE — Progress Notes (Signed)
 Subjective:  HPI: Theresa Sawyer is a 43 y.o. female presenting on 11/06/2023 for Results (Discuss recent lab work )   HPI Patient is in today for delayed repeat CBC and discussion of labs. Liver enzymes remain mildly elevated, will obtain RUQ US . Hep C negative. TSH normal. Avoiding alcohol, Tylenol , NSAIDs. Labs also revealed neutrophilia and moncytosis, she does have known hx of psoriasis. Also endorses joint pain and swelling in multiple joints. Will repeat CBC today and check RF, CCP, CRP, ESR. Was previously followed by Rheumatology and would like local referral.  Was recently referred to GI for uncontrolled GERD and endoscopy, has not been scheduled, number provided to call. Has not been scheduled for thyroid  US , number provided for scheduling.   Review of Systems  All other systems reviewed and are negative.   Relevant past medical history reviewed and updated as indicated.   Past Medical History:  Diagnosis Date   Abnormal uterine bleeding (AUB)    Anxiety    Arthritis    oa   Depression    GERD (gastroesophageal reflux disease)    History of chest pain (10-20-2019  pt denies any cardiac s&s)   mutliple ED visits, atypical chest pain, chest wall pain, muscularskeletal chest pain;  pt had cardiology evaulation dated 04-23-2018 note in epic , state atypical chest wall pain with negative d dimer and troponin's multiple times in epic, suggested CTA  (pt did not get done)   Hyperlipidemia    Hypertension    IDA (iron deficiency anemia)    none recently   Menorrhagia    Migraine    OSA on CPAP    followed by dr ather cpap set on 7 to 14   Psoriasis    Urinary frequency      Past Surgical History:  Procedure Laterality Date   CHOLECYSTECTOMY N/A 10/25/2021   Procedure: LAPAROSCOPIC CHOLECYSTECTOMY with INTRAOPERATIVE CHOLANGIOGRAM;  Surgeon: Candyce Champagne, MD;  Location: [facility];  Service: General;  Laterality: N/A;   DILATATION & CURETTAGE/HYSTEROSCOPY WITH MYOSURE N/A  10/24/2019   Procedure: DILATATION & CURETTAGE/HYSTEROSCOPY WITH MYOSURE;  Surgeon: Mallory Seaman, MD;  Location: [facility];  Service: Gynecology;  Laterality: N/A;   HYSTEROSCOPY WITH NOVASURE N/A 04/06/2020   Procedure: DILATION AND CURRETTAGE; NOVASURE ABLATION;  Surgeon: Mallory Seaman, MD;  Location: [facility];  Service: Gynecology;  Laterality: N/A;   LAPAROSCOPY N/A 04/06/2020   Procedure: LAPAROSCOPY OPERATIVE; LYSIS OF ADHESION;  Surgeon: Mallory Seaman, MD;  Location: [facility];  Service: Gynecology;  Laterality: N/A;   ROBOTIC ASSISTED TOTAL HYSTERECTOMY WITH BILATERAL SALPINGO OOPHERECTOMY Bilateral 09/28/2020   Procedure: XI ROBOTIC ASSISTED TOTAL LAPAROSCOPIC HYSTERECTOMY WITH BILATERAL SALPINGO OOPHORECTOMY;  Surgeon: Lavoie, Marie-Lyne, MD;  Location: [facility];  Service: Gynecology;  Laterality: Bilateral;  request 8:30am OR start time in IQUEUE held time for Dr. Lavoie requests 2 hours KATHY CONFIRMED ON 2/10 W/NICK FOR  TRACY (RNFA) TO ASSIST   TUBAL LIGATION Bilateral 02/05/2013   Procedure: POST PARTUM TUBAL LIGATION;  Surgeon: Tresia Fruit, MD;  Location: [facility];  Service: Gynecology;  Laterality: Bilateral;    Filshie clips    Allergies and medications reviewed and updated.   Current Outpatient Medications:    cyclobenzaprine  (FLEXERIL ) 10 MG tablet, Take 1 tablet (10 mg total) by mouth 2 (two) times daily as needed for muscle spasms., Disp: 20 tablet, Rfl: 0   escitalopram  (LEXAPRO ) 20 MG tablet, TAKE 1 TABLET(20 MG) BY MOUTH DAILY,  Disp: 90 tablet, Rfl: 3   estradiol  (VIVELLE -DOT) 0.1 MG/24HR patch, Place 1 patch (0.1 mg total) onto the skin 2 (two) times a week., Disp: 24 patch, Rfl: 0   famotidine  (PEPCID ) 40 MG tablet, TAKE 1 TABLET(40 MG) BY MOUTH TWICE DAILY, Disp: 60 tablet, Rfl: 3   hydrochlorothiazide  (HYDRODIURIL ) 25 MG tablet, Take 1 tablet (25 mg total) by mouth daily., Disp: 90 tablet,  Rfl: 3   lidocaine  (LIDODERM ) 5 %, Place 1 patch onto the skin daily. Remove & Discard patch within 12 hours or as directed by MD, Disp: 30 patch, Rfl: 0   metFORMIN  (GLUCOPHAGE -XR) 500 MG 24 hr tablet, Take 1 tablet (500 mg total) by mouth 2 (two) times daily with a meal., Disp: 180 tablet, Rfl: 3   oxyCODONE  (OXY IR/ROXICODONE ) 5 MG immediate release tablet, Take by mouth., Disp: , Rfl:    rosuvastatin  (CRESTOR ) 20 MG tablet, Take 0.5 tablets (10 mg total) by mouth daily., Disp: 90 tablet, Rfl: 3   valsartan  (DIOVAN ) 80 MG tablet, TAKE 1 AND 1/2 TABLETS(120 MG) BY MOUTH DAILY, Disp: 60 tablet, Rfl: 0   Vitamin D , Ergocalciferol , (DRISDOL ) 1.25 MG (50000 UNIT) CAPS capsule, TAKE 1 CAPSULE BY MOUTH EVERY 7 DAYS, Disp: 5 capsule, Rfl: 3  No Known Allergies  Objective:   BP (!) 148/92   Pulse 68   Temp 98.2 F (36.8 C) (Oral)   Ht 5\' 5"  (1.651 m)   Wt 252 lb 2 oz (114.4 kg)   LMP 09/01/2020   SpO2 98%   BMI 41.96 kg/m      11/06/2023    2:16 PM 10/10/2023    8:15 AM 10/01/2023   11:15 AM  Vitals with BMI  Height 5\' 5"  5\' 5"    Weight 252 lbs 2 oz 249 lbs 6 oz   BMI 41.96 41.5   Systolic 148 132 161  Diastolic 92 76 66  Pulse 68 83 65     Physical Exam Vitals and nursing note reviewed.  Constitutional:      Appearance: Normal appearance. She is normal weight.  HENT:     Head: Normocephalic and atraumatic.  Neck:     Thyroid : Thyromegaly present.  Cardiovascular:     Rate and Rhythm: Normal rate and regular rhythm.     Pulses: Normal pulses.     Heart sounds: Normal heart sounds.  Pulmonary:     Effort: Pulmonary effort is normal.     Breath sounds: Normal breath sounds.  Musculoskeletal:     Right wrist: Swelling present.     Left wrist: Swelling present.     Right hand: Swelling present.     Left hand: Swelling present.     Right ankle: Swelling present.     Left ankle: Swelling present.  Skin:    General: Skin is warm and dry.  Neurological:     General: No  focal deficit present.     Mental Status: She is alert and oriented to person, place, and time. Mental status is at baseline.  Psychiatric:        Mood and Affect: Mood normal.        Behavior: Behavior normal.        Thought Content: Thought content normal.        Judgment: Judgment normal.     Assessment & Plan:  Psoriasis Assessment & Plan: Referral to rheumatology  Orders: -     Ambulatory referral to Rheumatology -     CBC with Differential/Platelet -  Sedimentation rate -     C-reactive protein -     Rheumatoid factor -     Cyclic citrul peptide antibody, IgG  Elevated liver enzymes Assessment & Plan: RUQ US  ordered.  Orders: -     US  ABDOMEN LIMITED RUQ (LIVER/GB); Future  Primary osteoarthritis involving multiple joints Assessment & Plan: Pain and swelling to joints of wrists, ankles, and both hands. Refer back to rheum. Obtained crp, esr, ccp, rf today.Using Oxycodone  for pain mangement.   Orders: -     CBC with Differential/Platelet -     Sedimentation rate -     C-reactive protein -     Rheumatoid factor -     Cyclic citrul peptide antibody, IgG     Follow up plan: Return if symptoms worsen or fail to improve.  Jenelle Mis, FNP

## 2023-11-06 NOTE — Assessment & Plan Note (Signed)
RUQ [place] ordered

## 2023-11-07 LAB — CBC WITH DIFFERENTIAL/PLATELET
Absolute Lymphocytes: 4124 {cells}/uL — ABNORMAL HIGH (ref 850–3900)
Absolute Monocytes: 1123 {cells}/uL — ABNORMAL HIGH (ref 200–950)
Basophils Absolute: 110 {cells}/uL (ref 0–200)
Basophils Relative: 0.8 %
Eosinophils Absolute: 356 {cells}/uL (ref 15–500)
Eosinophils Relative: 2.6 %
HCT: 39.4 % (ref 35.0–45.0)
Hemoglobin: 13.3 g/dL (ref 11.7–15.5)
MCH: 31 pg (ref 27.0–33.0)
MCHC: 33.8 g/dL (ref 32.0–36.0)
MCV: 91.8 fL (ref 80.0–100.0)
MPV: 10 fL (ref 7.5–12.5)
Monocytes Relative: 8.2 %
Neutro Abs: 7987 {cells}/uL — ABNORMAL HIGH (ref 1500–7800)
Neutrophils Relative %: 58.3 %
Platelets: 308 10*3/uL (ref 140–400)
RBC: 4.29 10*6/uL (ref 3.80–5.10)
RDW: 13.5 % (ref 11.0–15.0)
Total Lymphocyte: 30.1 %
WBC: 13.7 10*3/uL — ABNORMAL HIGH (ref 3.8–10.8)

## 2023-11-07 LAB — SEDIMENTATION RATE: Sed Rate: 2 mm/h (ref 0–20)

## 2023-11-07 LAB — CYCLIC CITRUL PEPTIDE ANTIBODY, IGG: Cyclic Citrullin Peptide Ab: <16 U

## 2023-11-07 LAB — RHEUMATOID FACTOR: Rheumatoid fact SerPl-aCnc: <10 [IU]/mL (ref <14)

## 2023-11-07 LAB — C-REACTIVE PROTEIN: CRP: <3 mg/L (ref <8.0)

## 2023-11-07 MED ORDER — VALSARTAN 80 MG PO TABS: 80.0000 mg | ORAL_TABLET | Freq: Every day | ORAL | 0 refills | Status: DC

## 2023-11-22 ENCOUNTER — Ambulatory Visit (HOSPITAL_COMMUNITY): Payer: Self-pay

## 2023-12-04 ENCOUNTER — Encounter: Payer: Self-pay | Admitting: Family Medicine

## 2023-12-06 ENCOUNTER — Ambulatory Visit (HOSPITAL_COMMUNITY): Payer: Self-pay

## 2023-12-06 ENCOUNTER — Ambulatory Visit (HOSPITAL_COMMUNITY): Attending: Family Medicine

## 2023-12-06 ENCOUNTER — Encounter (HOSPITAL_COMMUNITY): Payer: Self-pay

## 2023-12-20 ENCOUNTER — Other Ambulatory Visit: Payer: Self-pay | Admitting: Family Medicine

## 2024-01-14 ENCOUNTER — Telehealth

## 2024-01-14 ENCOUNTER — Ambulatory Visit: Payer: Self-pay | Admitting: *Deleted

## 2024-01-14 NOTE — Telephone Encounter (Signed)
  Second attempt to call patient- no answer- mailbox is full- unable to leave call back message   Copied from CRM [id number]. Topic: Clinical - Medical Advice >> Jan 14, 2024  1:08 PM Precious C wrote: Reason for CRM: Patient called in reporting severe pain due to a bone spur in her spine. She requested Tramadol  for pain relief and stated she is unable to take over-the-counter pain medications. She would like to speak with the doctor regarding how to manage her pain moving forward. Callback number: [phone] patient is available anytime.

## 2024-01-14 NOTE — Telephone Encounter (Signed)
 Attempted to call patient- no answer- mailbox is full- unable to leave call back message  Copied from CRM [id number]. Topic: Clinical - Medical Advice >> Jan 14, 2024  1:08 PM Theresa Sawyer wrote: Reason for CRM: Patient called in reporting severe pain due to a bone spur in her spine. She requested Tramadol  for pain relief and stated she is unable to take over-the-counter pain medications. She would like to speak with the doctor regarding how to manage her pain moving forward. Callback number: [phone] patient is available anytime.

## 2024-01-14 NOTE — Telephone Encounter (Signed)
 FYI Only or Action Required?: FYI only for provider.  Patient was last seen in primary care on 11/06/2023 by Kayla Jeoffrey RAMAN, FNP.  Called Nurse Triage reporting New Med Request and Pain.  Symptoms began today.  Interventions attempted: Nothing.  Symptoms are: gradually worsening.  Triage Disposition: No disposition on file.  Patient/caregiver understands and will follow disposition?:      Reason for Disposition . [1] MODERATE back pain (e.g., interferes with normal activities) AND [2] present > 3 days  Answer Assessment - Initial Assessment Questions 1. ONSET: When did the pain begin?      This morning 2. LOCATION: Where does it hurt? (upper, mid or lower back)     Low back pain due to bone spur 3. SEVERITY: How bad is the pain?  (e.g., Scale 1-10; mild, moderate, or severe)   - MILD (1-3): Doesn't interfere with normal activities.    - MODERATE (4-7): Interferes with normal activities or awakens from sleep.    - SEVERE (8-10): Excruciating pain, unable to do any normal activities.      severe 4. PATTERN: Is the pain constant? (e.g., yes, no; constant, intermittent)      constant 5. RADIATION: Does the pain shoot into your legs or somewhere else?     Pain goes down to butt area 6. CAUSE:  What do you think is causing the back pain?      Bone spur 7. BACK OVERUSE:  Any recent lifting of heavy objects, strenuous work or exercise?     N/a 8. MEDICINES: What have you taken so far for the pain? (e.g., nothing, acetaminophen , NSAIDS)     N/a 9. NEUROLOGIC SYMPTOMS: Do you have any weakness, numbness, or problems with bowel/bladder control?     no 10. OTHER SYMPTOMS: Do you have any other symptoms? (e.g., fever, abdomen pain, burning with urination, blood in urine)       no 11. PREGNANCY: Is there any chance you are pregnant? When was your last menstrual period?       N/a  Protocols used: Back Pain-A-AH

## 2024-03-31 ENCOUNTER — Other Ambulatory Visit (HOSPITAL_BASED_OUTPATIENT_CLINIC_OR_DEPARTMENT_OTHER): Payer: Self-pay

## 2024-03-31 ENCOUNTER — Other Ambulatory Visit: Payer: Self-pay

## 2024-03-31 ENCOUNTER — Emergency Department (HOSPITAL_BASED_OUTPATIENT_CLINIC_OR_DEPARTMENT_OTHER)
Admission: EM | Admit: 2024-03-31 | Discharge: 2024-03-31 | Disposition: A | Discharge status: Home / Self Care | Payer: Self-pay | Attending: Emergency Medicine | Admitting: Emergency Medicine

## 2024-03-31 ENCOUNTER — Encounter (HOSPITAL_BASED_OUTPATIENT_CLINIC_OR_DEPARTMENT_OTHER): Payer: Self-pay | Admitting: Emergency Medicine

## 2024-03-31 DIAGNOSIS — R109 Unspecified abdominal pain: Secondary | ICD-10-CM | POA: Insufficient documentation

## 2024-03-31 DIAGNOSIS — B029 Zoster without complications: Secondary | ICD-10-CM | POA: Insufficient documentation

## 2024-03-31 LAB — URINALYSIS, ROUTINE W REFLEX MICROSCOPIC
Bilirubin Urine: NEGATIVE
Glucose, UA: NEGATIVE mg/dL
Hgb urine dipstick: NEGATIVE
Ketones, ur: NEGATIVE mg/dL
Leukocytes,Ua: NEGATIVE
Nitrite: NEGATIVE
Protein, ur: NEGATIVE mg/dL
Specific Gravity, Urine: 1.016 (ref 1.005–1.030)
pH: 5.5 (ref 5.0–8.0)

## 2024-03-31 LAB — CBC WITH DIFFERENTIAL/PLATELET
Abs Immature Granulocytes: 0.02 K/uL (ref 0.00–0.07)
Basophils Absolute: 0.1 K/uL (ref 0.0–0.1)
Basophils Relative: 1 %
Eosinophils Absolute: 0.2 K/uL (ref 0.0–0.5)
Eosinophils Relative: 2 %
HCT: 41.8 % (ref 36.0–46.0)
Hemoglobin: 14.2 g/dL (ref 12.0–15.0)
Immature Granulocytes: 0 %
Lymphocytes Relative: 29 %
Lymphs Abs: 3.2 K/uL (ref 0.7–4.0)
MCH: 30.7 pg (ref 26.0–34.0)
MCHC: 34 g/dL (ref 30.0–36.0)
MCV: 90.5 fL (ref 80.0–100.0)
Monocytes Absolute: 0.8 K/uL (ref 0.1–1.0)
Monocytes Relative: 7 %
Neutro Abs: 6.6 K/uL (ref 1.7–7.7)
Neutrophils Relative %: 61 %
Platelets: 279 K/uL (ref 150–400)
RBC: 4.62 MIL/uL (ref 3.87–5.11)
RDW: 13.2 % (ref 11.5–15.5)
WBC: 11 K/uL — ABNORMAL HIGH (ref 4.0–10.5)
nRBC: 0 % (ref 0.0–0.2)

## 2024-03-31 LAB — COMPREHENSIVE METABOLIC PANEL WITH GFR
ALT: 39 U/L (ref 0–44)
AST: 24 U/L (ref 15–41)
Albumin: 4.6 g/dL (ref 3.5–5.0)
Alkaline Phosphatase: 94 U/L (ref 38–126)
Anion gap: 12 (ref 5–15)
BUN: 11 mg/dL (ref 6–20)
CO2: 22 mmol/L (ref 22–32)
Calcium: 10.1 mg/dL (ref 8.9–10.3)
Chloride: 106 mmol/L (ref 98–111)
Creatinine, Ser: 0.81 mg/dL (ref 0.44–1.00)
GFR, Estimated: >60 mL/min (ref >60)
Glucose, Bld: 106 mg/dL — ABNORMAL HIGH (ref 70–99)
Potassium: 3.9 mmol/L (ref 3.5–5.1)
Sodium: 140 mmol/L (ref 135–145)
Total Bilirubin: 0.6 mg/dL (ref 0.0–1.2)
Total Protein: 7.4 g/dL (ref 6.5–8.1)

## 2024-03-31 MED ORDER — OXYCODONE HCL 5 MG PO TABS
10.0000 mg | ORAL_TABLET | Freq: Four times a day (QID) | ORAL | 0 refills | Status: DC | PRN
  Filled 2024-03-31: qty 20, 3d supply, fill #0

## 2024-03-31 MED ORDER — VALACYCLOVIR HCL 1 G PO TABS
1000.0000 mg | ORAL_TABLET | Freq: Three times a day (TID) | ORAL | 0 refills | Status: AC
Start: 2024-03-31 — End: 2024-04-07
  Filled 2024-03-31: qty 21, 7d supply, fill #0

## 2024-03-31 MED ORDER — LIDOCAINE 5 % EX PTCH
1.0000 | MEDICATED_PATCH | CUTANEOUS | 0 refills | Status: DC
  Filled 2024-03-31: qty 14, 14d supply, fill #0

## 2024-03-31 MED ORDER — LIDOCAINE 5 % EX PTCH
2.0000 | MEDICATED_PATCH | CUTANEOUS | Status: DC
  Administered 2024-03-31: 2 via TRANSDERMAL
  Filled 2024-03-31: qty 2

## 2024-03-31 MED ORDER — OXYCODONE HCL 5 MG PO TABS
10.0000 mg | ORAL_TABLET | ORAL | Status: AC
  Administered 2024-03-31: 10 mg via ORAL
  Filled 2024-03-31: qty 2

## 2024-03-31 NOTE — ED Provider Notes (Signed)
 [place] EMERGENCY DEPARTMENT AT [facility] Provider Note   CSN: [id number] Arrival date & time: 03/31/24  9243     Patient presents with: No chief complaint on file.   Theresa Sawyer is a 43 y.o. female.   43 year old female with history of psoriatic arthritis, chronic knee and back pain on oxycodone  at home, who presents emergency department with rash and flank pain.  Patient reports that for the past week she noticed a rash under her left breast on her left side.  It is different than her typical psoriasis rash.  Says that is very painful and that she has pain from it wrapping around her rib cage as well.  No fevers or chills.  Does not believe she has had shingles before or been vaccinated against it.  Not currently on any immunosuppressants.  Denies any weakness or numbness or bowel or bladder incontinence.       Prior to Admission medications   Medication Sig Start Date End Date Taking? Authorizing Provider  lidocaine  (LIDODERM ) 5 % Place 1 patch onto the skin daily. Remove & Discard patch within 12 hours or as directed by MD 03/31/24  Yes Yolande Lamar BROCKS, MD  oxyCODONE  (ROXICODONE ) 5 MG immediate release tablet Take 2 tablets (10 mg total) by mouth every 6 (six) hours as needed for moderate pain (pain score 4-6). 03/31/24  Yes Yolande Lamar BROCKS, MD  valACYclovir  (VALTREX ) 1000 MG tablet Take 1 tablet (1,000 mg total) by mouth 3 (three) times daily for 7 days. 03/31/24 04/07/24 Yes Yolande Lamar BROCKS, MD  cyclobenzaprine  (FLEXERIL ) 10 MG tablet Take 1 tablet (10 mg total) by mouth 2 (two) times daily as needed for muscle spasms. 10/01/23   Jerrol Agent, MD  escitalopram  (LEXAPRO ) 20 MG tablet TAKE 1 TABLET(20 MG) BY MOUTH DAILY 03/24/23   Purcell Emil Schanz, MD  estradiol  (VIVELLE -DOT) 0.1 MG/24HR patch Place 1 patch (0.1 mg total) onto the skin 2 (two) times a week. 08/17/22   Prentiss Riggs A, NP  famotidine  (PEPCID ) 40 MG tablet TAKE 1 TABLET(40 MG) BY MOUTH TWICE  DAILY 05/18/20   Eda Iha, MD  hydrochlorothiazide  (HYDRODIURIL ) 25 MG tablet Take 1 tablet (25 mg total) by mouth daily. 08/23/22   Kayla Jeoffrey RAMAN, FNP  metFORMIN  (GLUCOPHAGE -XR) 500 MG 24 hr tablet Take 1 tablet (500 mg total) by mouth 2 (two) times daily with a meal. 08/24/22   Kayla Jeoffrey RAMAN, FNP  rosuvastatin  (CRESTOR ) 20 MG tablet Take 0.5 tablets (10 mg total) by mouth daily. 08/24/22   Kayla Jeoffrey RAMAN, FNP  valsartan  (DIOVAN ) 80 MG tablet TAKE 1 AND 1/2 TABLETS BY MOUTH DAILY 12/22/23   Kayla Jeoffrey RAMAN, FNP  Vitamin D , Ergocalciferol , (DRISDOL ) 1.25 MG (50000 UNIT) CAPS capsule TAKE 1 CAPSULE BY MOUTH EVERY 7 DAYS 06/10/21   Sagardia, Miguel Jose, MD    Allergies: Patient has no known allergies.    Review of Systems  Updated Vital Signs BP 118/84 (BP Location: Right Arm)   Pulse 71   Temp 97.7 F (36.5 C) (Oral)   Resp 19   Ht 5' 5 (1.651 m)   Wt 108.9 kg   LMP 09/01/2020   SpO2 98%   BMI 39.94 kg/m   Physical Exam Constitutional:      Appearance: Normal appearance.  HENT:     Head: Normocephalic.     Right Ear: External ear normal.     Left Ear: External ear normal.  Musculoskeletal:     Comments: Radial  and DP pulses 2+ bilaterally.  Full strength of bilateral upper and lower extremities.  See image below for rash on left side.  Is tender to light touch.  Neurological:     Mental Status: She is alert.     Underneath left breast and left upper quadrant:   (all labs ordered are listed, but only abnormal results are displayed) Labs Reviewed  CBC WITH DIFFERENTIAL/PLATELET - Abnormal; Notable for the following components:      Result Value   WBC 11.0 (*)    All other components within normal limits  COMPREHENSIVE METABOLIC PANEL WITH GFR - Abnormal; Notable for the following components:   Glucose, Bld 106 (*)    All other components within normal limits  URINALYSIS, ROUTINE W REFLEX MICROSCOPIC    EKG: None  Radiology: No results  found.   Procedures   Medications Ordered in the ED  lidocaine  (LIDODERM ) 5 % 2 patch (2 patches Transdermal Patch Applied 03/31/24 0849)  oxyCODONE  (Oxy IR/ROXICODONE ) immediate release tablet 10 mg (10 mg Oral Given 03/31/24 1009)                                    Medical Decision Making Amount and/or Complexity of Data Reviewed Labs: ordered.  Risk Prescription drug management.   Theresa Sawyer is a 43 year old female with psoriatic arthritis, chronic knee pain, and chronic back pain who presents emergency department with rash and flank pain on the left side  Initial Ddx:  Herpes zoster, cellulitis, psoriasis, kidney stone, pyelonephritis, intertrigo  MDM/Course:  Patient presents emergency department with a rash underneath her left breast that wraps around to her flank.  No other infectious symptoms.  No injuries.  No bowel or bladder incontinence or back pain that is new.  On exam does have a rash but no vesicles.  Does appear to follow dermatomal distribution.  No signs of UTI or kidney stones on urinalysis.  It is somewhat atypical for zoster and given the duration of symptoms unclear if antivirals will help but we will go ahead and trial them.  Also give a short course of oxycodone  without Tylenol  which she can take every 6 hours for the time being.  Will have her follow-up with her primary doctor in several days to ensure that her symptoms are resolving.    This patient presents to the ED for concern of complaints listed in HPI, this involves an extensive number of treatment options, and is a complaint that carries with it a high risk of complications and morbidity. Disposition including potential need for admission considered.   Dispo: DC Home. Return precautions discussed including, but not limited to, those listed in the AVS. Allowed pt time to ask questions which were answered fully prior to dc.  Records reviewed Outpatient Clinic Notes The following labs were  independently interpreted: Urinalysis and show no acute abnormality I have reviewed the patients home medications and made adjustments as needed  Portions of this note were generated with Dragon dictation software. Dictation errors may occur despite best attempts at proofreading.      Final diagnoses:  Herpes zoster without complication  Flank pain    ED Discharge Orders          Ordered    oxyCODONE  (ROXICODONE ) 5 MG immediate release tablet  Every 6 hours PRN        03/31/24 1125    lidocaine  (LIDODERM ) 5 %  Every 24 hours        03/31/24 1126    valACYclovir  (VALTREX ) 1000 MG tablet  3 times daily        03/31/24 1126               Yolande Lamar BROCKS, MD 03/31/24 1450

## 2024-03-31 NOTE — ED Triage Notes (Signed)
 Pt caox4, ambulatory c/o L flank pain with rash to the area, pt states she has pain radiating on her whole left side from her foot to her ear. Pt has hx psoriasis but states the rash on her side is different.

## 2024-03-31 NOTE — Discharge Instructions (Signed)
 You were seen for your left flank pain and rash in the emergency department.   At home, please take Valtrex  in case it is from shingles.  Take the oxycodone  we prescribed you every 6 hours for your pain as well as the lidocaine  patches.  Follow-up with your pain clinic if you need additional medication.  Check your MyChart online for the results of any tests that had not resulted by the time you left the emergency department.   Follow-up with your primary doctor in 2-3 days regarding your visit.    Return immediately to the emergency department if you experience any other concerning symptoms.    Thank you for visiting our Emergency Department. It was a pleasure taking care of you today.

## 2024-04-08 ENCOUNTER — Other Ambulatory Visit: Payer: Self-pay | Admitting: Family Medicine

## 2024-04-08 DIAGNOSIS — F411 Generalized anxiety disorder: Secondary | ICD-10-CM

## 2024-04-08 NOTE — Progress Notes (Addendum)
 Office Visit Note  Patient: Theresa Sawyer             Date of Birth: [birth year]           MRN: [id number]             PCP: Kayla Jeoffrey RAMAN, FNP Referring: Kayla Jeoffrey RAMAN, FNP Visit Date: 04/22/2024 Occupation: Data Unavailable  Subjective:  Pain and swelling in multiple joints  History of Present Illness: Theresa Sawyer is a 43 y.o. female with psoriatic arthritis and psoriasis.  She returns today after her last visit in July 2021.  She was previously treated with methotrexate which was discontinued due to fatigue.  Otezla  had an adequate response.  We applied for Cosentyx which was denied by insurance.  She also has history of extensive psoriasis treated by dermatology in the past.  At her last visit we discussed the starting on Humira.  Patient states she did not have good insurance coverage at that time and then after COVID-19 pandemic she did not return for a follow-up visit. She states she had been under care of dermatologist and used topical agents which did not work.  She has been undergoing a lot of difficult time and was depressed and taking care of her depression. He continues to have psoriasis on her arms and legs..  She states that she has significant morning stiffness.  She continues to have pain in almost all of her joints.  The worst joints are her hands, her knees and her feet.  She notices swelling in her hands, knees and her feet.  She also reports discomfort in the lower back.  She gets recurrent plantar fasciitis and Achilles tendinitis.  She has been to [facility].  She does a stretching exercises as needed.  She has taken NSAIDs and Tylenol  but it causes side effects.  She states her liver enzymes are elevated. She is right-handed, Designer, television/film set.  She is walking for exercise.  She is single.  She is gravida 1, para 1.  There is no history of preeclampsia or DVTs.  She is a status post tubal ligation.  She does not drink alcohol.  She smokes half a pack per day for  20+ years.  There is history of psoriasis in her sister and first cousin.    Activities of Daily Living:  Patient reports morning stiffness for all day. Patient Reports nocturnal pain.  Difficulty dressing/grooming: Denies Difficulty climbing stairs: Reports Difficulty getting out of chair: Reports Difficulty using hands for taps, buttons, cutlery, and/or writing: Reports  Review of Systems  Constitutional:  Positive for fatigue.  HENT:  Positive for mouth sores and mouth dryness.   Eyes:  Positive for dryness.  Respiratory:  Positive for shortness of breath.   Cardiovascular:  Positive for palpitations.  Gastrointestinal:  Positive for constipation and diarrhea. Negative for blood in stool.  Endocrine: Positive for increased urination.  Genitourinary:  Positive for involuntary urination.  Musculoskeletal:  Positive for joint pain, joint pain, joint swelling, myalgias, morning stiffness, muscle tenderness and myalgias. Negative for gait problem and muscle weakness.  Skin:  Positive for rash and hair loss. Negative for color change and sensitivity to sunlight.  Allergic/Immunologic: Positive for susceptible to infections.  Neurological:  Positive for dizziness and headaches.  Hematological:  Negative for swollen glands.  Psychiatric/Behavioral:  Positive for depressed mood and sleep disturbance. The patient is nervous/anxious.     PMFS History:  Patient Active Problem List   Diagnosis  Date Noted   Elevated liver enzymes 11/06/2023   Physical exam, annual 10/10/2023   Morbid obesity (HCC) 10/10/2023   Left flank pain 10/10/2023   Thyroid  nodule 10/10/2023   Gastroesophageal reflux disease with esophagitis without hemorrhage 10/10/2023   Aortic atherosclerosis 10/10/2023   Depression 10/05/2022   Encounter to establish care with new doctor 08/23/2022   Tobacco dependence due to cigarettes 08/23/2022   Acute cholecystitis 10/24/2021   Dyslipidemia 10/30/2020   Prediabetes  10/30/2020   Postoperative state 09/28/2020   Essential hypertension 08/04/2020   Vitamin D  deficiency 08/04/2020   Gastroesophageal reflux disease without esophagitis 08/04/2020   Migraine without aura and without status migrainosus, not intractable 08/04/2020   Primary osteoarthritis involving multiple joints 03/18/2020   Dysfunctional uterine bleeding 03/18/2020   Generalized anxiety disorder 03/18/2020   Psoriatic arthritis (HCC) 01/24/2020   Primary osteoarthritis of both knees 01/24/2020   History of hyperlipidemia 01/24/2020   History of iron deficiency anemia 01/24/2020   BMI 40.0-44.9, adult (HCC) 01/24/2020   OSA on CPAP 04/24/2019   Psoriasis 12/05/2018   Situational anxiety 12/05/2018    Past Medical History:  Diagnosis Date   Abnormal uterine bleeding (AUB)    Anxiety    Arthritis    oa   Depression    GERD (gastroesophageal reflux disease)    History of chest pain (10-20-2019  pt denies any cardiac s&s)   mutliple ED visits, atypical chest pain, chest wall pain, muscularskeletal chest pain;  pt had cardiology evaulation dated 04-23-2018 note in epic , state atypical chest wall pain with negative d dimer and troponin's multiple times in epic, suggested CTA  (pt did not get done)   Hyperlipidemia    Hypertension    IDA (iron deficiency anemia)    none recently   Menorrhagia    Migraine    OSA on CPAP    followed by dr ather cpap set on 7 to 14   Psoriasis    Urinary frequency     Family History  Problem Relation Age of Onset   Anxiety disorder Mother    Kidney disease Mother    Alcohol abuse Father    Mental illness Father    Psoriasis Sister    Other Sister        pre-diabetic   Heart attack Brother 76       deceased from this   Anxiety disorder Brother    Diabetes Maternal Grandmother    Cirrhosis Maternal Grandfather        alcohol related   Diabetes Paternal Grandmother    Allergy (severe) Son        & nosebleeds   Colon cancer Neg Hx     Esophageal cancer Neg Hx    Colon polyps Neg Hx    Stomach cancer Neg Hx    Pancreatic cancer Neg Hx    Past Surgical History:  Procedure Laterality Date   CHOLECYSTECTOMY N/A 10/25/2021   Procedure: LAPAROSCOPIC CHOLECYSTECTOMY with INTRAOPERATIVE CHOLANGIOGRAM;  Surgeon: Sheldon Standing, MD;  Location: [facility];  Service: General;  Laterality: N/A;   DILATATION & CURETTAGE/HYSTEROSCOPY WITH MYOSURE N/A 10/24/2019   Procedure: DILATATION & CURETTAGE/HYSTEROSCOPY WITH MYOSURE;  Surgeon: Arnaldo Purchase, MD;  [facility];  Service: Gynecology;  Laterality: N/A;   HYSTEROSCOPY WITH NOVASURE N/A 04/06/2020   Procedure: DILATION AND CURRETTAGE; NOVASURE ABLATION;  Surgeon: Arnaldo Purchase, MD;  Location: [facility];  Service: Gynecology;  Laterality: N/A;   LAPAROSCOPY N/A 04/06/2020  Procedure: LAPAROSCOPY OPERATIVE; LYSIS OF ADHESION;  Surgeon: Arnaldo Purchase, MD;  Location[facility]  Service: Gynecology;  Laterality: N/A;   ROBOTIC ASSISTED TOTAL HYSTERECTOMY WITH BILATERAL SALPINGO OOPHERECTOMY Bilateral 09/28/2020   Procedure: XI ROBOTIC ASSISTED TOTAL LAPAROSCOPIC HYSTERECTOMY WITH BILATERAL SALPINGO OOPHORECTOMY;  Surgeon: Lavoie, Marie-Lyne, MD;  Location: [facility];  Service: Gynecology;  Laterality: Bilateral;  request 8:30am OR start time in IQUEUE held time for Dr. Lavoie requests 2 hours KATHY CONFIRMED ON 2/10 W/NICK FOR  TRACY (RNFA) TO ASSIST   TUBAL LIGATION Bilateral 02/05/2013   Procedure: POST PARTUM TUBAL LIGATION;  Surgeon: Lynwood KANDICE Solomons, MD;  Location: [facility];  Service: Gynecology;  Laterality: Bilateral;    Filshie clips   Social History   Tobacco Use   Smoking status: Every Day    Current packs/day: 0.50    Average packs/day: 0.5 packs/day for 20.0 years (10.0 ttl pk-yrs)    Types: Cigarettes    Passive exposure: Never   Smokeless tobacco: Never  Vaping Use   Vaping status: Never Used   Substance Use Topics   Alcohol use: No   Drug use: Never   Social History   Social History Narrative   Not on file     Immunization History  Administered Date(s) Administered   Rho (D) Immune Globulin  07/10/2012, 12/30/2012, 02/05/2013   Tdap 02/06/2013     Objective: Vital Signs: BP 116/77 (BP Location: Right Arm, Patient Position: Sitting, Cuff Size: Large)   Pulse 69   Temp 97.8 F (36.6 C)   Resp 16   Ht 5' 6 (1.676 m)   Wt 224 lb 6.4 oz (101.8 kg)   LMP 09/01/2020   BMI 36.22 kg/m    Physical Exam Vitals and nursing note reviewed.  Constitutional:      Appearance: She is well-developed.  HENT:     Head: Normocephalic and atraumatic.  Eyes:     Conjunctiva/sclera: Conjunctivae normal.  Cardiovascular:     Rate and Rhythm: Normal rate and regular rhythm.     Heart sounds: Normal heart sounds.  Pulmonary:     Effort: Pulmonary effort is normal.     Breath sounds: Normal breath sounds.  Abdominal:     General: Bowel sounds are normal.     Palpations: Abdomen is soft.  Musculoskeletal:     Cervical back: Normal range of motion.  Lymphadenopathy:     Cervical: No cervical adenopathy.  Skin:    General: Skin is warm and dry.     Capillary Refill: Capillary refill takes less than 2 seconds.     Comments: Psoriasis plaques were noted to her bilateral elbows and her both shins.  Nail dystrophy was noted.  Erythema was noted under her breast and in the inguinal region.  Neurological:     Mental Status: She is alert and oriented to person, place, and time.  Psychiatric:        Behavior: Behavior normal.      Musculoskeletal Exam: Patient had limited painful range of motion of her cervical spine.  Thoracic kyphosis was noted.  She had limited painful range of motion of her lumbar spine.  She had tenderness over her SI joints.  She had discomfort range of motion of her shoulders and elbows but no synovitis was noted.  There was tenderness over wrist joints, MCPs  PIPs and DIPs with no synovitis noted.  No dactylitis was noted.  She had painful range of motion of bilateral hip joints and  tenderness over trochanteric region.  Knee joints in good range of motion without any warmth swelling or effusion.  She had tenderness over Achilles tendon and plantar fascia.  There was no dactylitis in her toes.  CDAI Exam: CDAI Score: -- Patient Global: --; Provider Global: -- Swollen: --; Tender: -- Joint Exam 04/22/2024   No joint exam has been documented for this visit   There is currently no information documented on the homunculus. Go to the Rheumatology activity and complete the homunculus joint exam.  Investigation: No additional findings.  Imaging: CT Angio Chest PE W and/or Wo Contrast Result Date: 04/21/2024 CLINICAL DATA:  Left-sided chest pain. EXAM: CT ANGIOGRAPHY CHEST WITH CONTRAST TECHNIQUE: Multidetector CT imaging of the chest was performed using the standard protocol during bolus administration of intravenous contrast. Multiplanar CT image reconstructions and MIPs were obtained to evaluate the vascular anatomy. RADIATION DOSE REDUCTION: This exam was performed according to the departmental dose-optimization program which includes automated exposure control, adjustment of the mA and/or kV according to patient size and/or use of iterative reconstruction technique. CONTRAST:  76mL OMNIPAQUE  IOHEXOL  350 MG/ML SOLN COMPARISON:  October 01, 2023 FINDINGS: Cardiovascular: Satisfactory opacification of the pulmonary arteries to the segmental level. No evidence of pulmonary embolism. Normal heart size. No pericardial effusion. Mediastinum/Nodes: No enlarged mediastinal, hilar, or axillary lymph nodes. The trachea and esophagus demonstrate no significant findings. A 1.9 cm low-attenuation thyroid  nodule is seen within the left lobe of the thyroid  gland. Lungs/Pleura: Lungs are clear. No pleural effusion or pneumothorax. Upper Abdomen: There is diffuse fatty  infiltration of the liver parenchyma. Multiple surgical clips are seen within the gallbladder fossa. Musculoskeletal: No chest wall abnormality. No acute or significant osseous findings. Review of the MIP images confirms the above findings. IMPRESSION: 1. No evidence of pulmonary embolism or acute cardiopulmonary disease. 2. Hepatic steatosis. 3. Evidence of prior cholecystectomy. 4. Stable low-attenuation left thyroid  nodule. Correlation with nonemergent thyroid  ultrasound is recommended. Electronically Signed   By: Suzen Dials M.D.   On: 04/21/2024 10:13   DG Chest 2 View Result Date: 04/21/2024 CLINICAL DATA:  Chest pain EXAM: CHEST - 2 VIEW COMPARISON:  10/01/2023 FINDINGS: The lungs are clear without focal pneumonia, edema, pneumothorax or pleural effusion. The cardiopericardial silhouette is within normal limits for size. No acute bony abnormality. Telemetry leads overlie the chest. IMPRESSION: No active cardiopulmonary disease. Electronically Signed   By: Camellia Candle M.D.   On: 04/21/2024 08:59    Recent Labs: Lab Results  Component Value Date   WBC 15.3 (H) 04/21/2024   HGB 15.9 (H) 04/21/2024   PLT 368 04/21/2024   NA 139 04/21/2024   K 3.8 04/21/2024   CL 101 04/21/2024   CO2 21 (L) 04/21/2024   GLUCOSE 149 (H) 04/21/2024   BUN 10 04/21/2024   CREATININE 0.85 04/21/2024   BILITOT 0.6 03/31/2024   ALKPHOS 94 03/31/2024   AST 24 03/31/2024   ALT 39 03/31/2024   PROT 7.4 03/31/2024   ALBUMIN 4.6 03/31/2024   CALCIUM  10.3 04/21/2024   GFRAA 122 07/28/2020   August 05, 2019 TB Gold negative, October 13, 2019 hepatitis panel negative, HIV negative. October 10, 2023 triglyceride 291, HDL 34, TSH normal, hemoglobin A1c 5.9, vitamin D  35 November 06, 2023 sed rate 2, C-reactive protein, 3.0, RF negative, anti-CCP negative October 10, 2023 triglycerides 291, LDL 56, HDL 34 10/01/23 CK 66 08/23/22 Hep C non-reactive 10/25/21 HIV non-reactive 01/26/20 SPEP normal, Igs normal, ANA  -,  Speciality Comments:  MTX- fatigue Otezla -inadequate response Cosentyx-denied by insurance  Procedures:  No procedures performed Allergies: Ibuprofen  and Tylenol  [acetaminophen ]   Assessment / Plan:     Visit Diagnoses: Psoriatic arthritis (HCC) -she has arthralgia in multiple joints and gives history of intermittent swelling.  No synovitis or dactylitis was noted on the examination.  She gives history of recurrent trochanteric bursitis, chronic SI joint pain, recurrent plantar fasciitis and Achilles tendinitis.  Patient states she has been treated at [facility] due to plantar fasciitis.  She has been doing stretching exercises.  Plan: Sedimentation rate.  We had detailed discussion in the past regarding different treatment options plan.  She had lost follow-up due to her insurance issues.  After discussing different treatment options and the side effects we decided to proceed with Tremfya.  Informed consent was obtained.  Will start on Tremfya once labs are available. Counseled patient that Tremfya is a IL-23 inhibitor.  Counseled patient on purpose, proper use, and adverse effects of Tremfya.  Reviewed the most common adverse effects including infection, URTIs, injection site reactions, nausea/diarrhea, and recurrence of tinea and HSV infections Counseled patient that Tremfya should be held for infection and prior to scheduled surgery.  Recommend annual influenza, PCV 15 or PCV20 or Pneumovax 23, and Shingrix as indicated.  Reviewed the importance of regular labs while on Tremfya therapy.  Will monitor CBC and CMP 1 month after starting and then every 3 months routinely thereafter. Will monitor TB gold annually. Standing orders placed.  Provided patient with medication education material and answered all questions.  Patient voiced understanding.  Patient consented to [facility].  Will upload consent into the media tab.  Reviewed storage instructions of Tremfya.  Advised initial injection must  be administered in office.  Patient verbalized understanding.  Will apply for Tremfya through patient's insurance and update when we receive a response.  Dose will be Tremfya 100 mg on weeks 0 and 4 then every 8 weeks thereafter.  Prescription pending lab results and insurance approval.   Psoriasis -she had extensive psoriasis on her bilateral elbows and her knees.  She is currently not using any topical agents.  Plan: Ambulatory referral to Dermatology  High risk medication use -white cell count was elevated on April 21, 2024.  I will recheck CBC today.  Patient takes NSAIDs on as needed basis.  I will recheck CMP today.  Plan: CBC with Differential/Platelet, Comprehensive metabolic panel with GFR, Sedimentation rate, QuantiFERON-TB Gold Plus, Hepatitis B surface antigen, Hepatitis B core antibody, IgM, Hepatitis B Surface AntiBODY.  I advised her to get immunization up-to-date.  Information on immunization was placed in the AVS.  If she starts on immunosuppressive agents we will check labs in a month and then every 3 months to monitor for drug toxicity.  She was also advised to stop the immunosuppressive agents if she develops an infection and resume after the infection resolves.  Elevated liver enzymes-she had elevated LFTs in the past.  LFTs were normal in September.  Pain in both hands -she complains of pain and discomfort in the bilateral hands and stiffness and difficulty making a fist.  She tenderness across the wrist joints, MCPs and PIPs.  No synovitis or dactylitis was noted.  She plan: XR Hand 2 View Right, XR Hand 2 View Left, x-rays of bilateral hands with history of osteoarthritis.  Rheumatoid factor, Cyclic citrul peptide antibody, IgG.  Chronic pain of both knees - Plan: XR KNEE 3 VIEW RIGHT, XR KNEE 3 VIEW LEFT.  X-rays were suggestive of bilateral mild osteoarthritis.  Primary osteoarthritis of both knees -complains of discomfort in her knee joints with intermittent swelling.  No  warmth swelling or effusion was noted.  Bilateral moderate osteoarthritis and moderate chondromalacia patella  Pain in both feet -she complains of discomfort in her feet.  She gives history of Achilles tendinitis or plantar fasciitis.  She had tenderness over Achilles tendon and plantar fascia.  No dactylitis was noted.  Plan: XR Foot 2 Views Right, XR Foot 2 Views Left.  X-rays of bilateral feet presents chief of osteoarthritis.  Bilateral inferior and posterior calcaneal spurs were noted.  Chronic SI joint pain -she tenderness over bilateral SI joints.  Plan: XR Pelvis 1-2 Views.  X-rays of SI joints were unremarkable.  Candidiasis -patient gives history of frequent yeast infection under her breast and also in the inguinal region.  She states she also had shingles under her breast.  I will refer her to dermatology.  Plan: Ambulatory referral to Dermatology  Nail dystrophy-nail dystrophy was noted in her toenails most likely related to psoriasis.  She will be evaluated by dermatology.  Alternating constipation and diarrhea -she gives history of chronic diarrhea and constipation for several years.  She denies any history of blood in her stool.  She also gives history of abdominal discomfort and bloating.  I will refer to gastroenterology.  Plan: Ambulatory referral to Gastroenterology  Essential hypertension-blood pressure was normal at 116/77.  Dyslipidemia-she had elevated triglycerides 291 on October 10, 2023.  Other medical problems are listed as follows:  Aortic atherosclerosis  Prediabetes  Gastroesophageal reflux disease with esophagitis without hemorrhage  Vitamin D  deficiency  Migraine without aura and without status migrainosus, not intractable  Thyroid  nodule  Anxiety and depression  OSA on CPAP  Smoker - Half pack per day for 20+ years.  Family history of psoriasis in sister and first cousin  Orders: Orders Placed This Encounter  Procedures   XR Hand 2 View Right    XR Hand 2 View Left   XR KNEE 3 VIEW RIGHT   XR KNEE 3 VIEW LEFT   XR Foot 2 Views Right   XR Foot 2 Views Left   XR Pelvis 1-2 Views   CBC with Differential/Platelet   Comprehensive metabolic panel with GFR   Sedimentation rate   Rheumatoid factor   Cyclic citrul peptide antibody, IgG   QuantiFERON-TB Gold Plus   Hepatitis B surface antigen   Hepatitis B core antibody, IgM   Hepatitis B Surface AntiBODY   Ambulatory referral to Dermatology   Ambulatory referral to Gastroenterology   No orders of the defined types were placed in this encounter.  Face-to-face time spent patient was over 60 minutes.  Greater than 50% time was spent in counseling and coordination of care.  Follow-Up Instructions: Return for Psoriatic arthritis.   Maya Nash, MD  Note - This record has been created using Animal nutritionist.  Chart creation errors have been sought, but may not always  have been located. Such creation errors do not reflect on  the standard of medical care.

## 2024-04-08 NOTE — Telephone Encounter (Unsigned)
 Copied from CRM #[id number]. Topic: Clinical - Medication Refill >> Apr 08, 2024  4:31 PM Teressa P wrote: Medication: Escitalopram  /Lexapro  20 mg  Has the patient contacted their pharmacy? Yes  This is the patient's preferred pharmacy:  [id number] [place], [place] - [address] AT [facility] [address] [facility] [address] [place] [address] Phone: [phone] Fax: [phone]    Has the prescription been filled recently? Yes  Is the patient out of the medication? Yes  Has the patient been seen for an appointment in the last year OR does the patient have an upcoming appointment? Yes  Can we respond through MyChart? No  Agent: Please be advised that Rx refills may take up to 3 business days. We ask that you follow-up with your pharmacy.

## 2024-04-10 ENCOUNTER — Other Ambulatory Visit (HOSPITAL_BASED_OUTPATIENT_CLINIC_OR_DEPARTMENT_OTHER): Payer: Self-pay

## 2024-04-10 MED ORDER — ESCITALOPRAM OXALATE 20 MG PO TABS: 20.0000 mg | ORAL_TABLET | Freq: Every day | ORAL | 1 refills | Status: DC

## 2024-04-10 NOTE — Telephone Encounter (Signed)
 Requested medications are due for refill today.  yes  Requested medications are on the active medications list.  yes  Last refill. 03/24/2023 #[id number] [id number]  Future visit scheduled.   no  Notes to clinic.  Rx signed by Dr. Purcell.    Requested Prescriptions  Pending Prescriptions Disp Refills   escitalopram  (LEXAPRO ) 20 MG tablet 90 tablet 3     Psychiatry:  Antidepressants - SSRI Failed - 04/10/2024  1:43 PM      Failed - Completed PHQ-2 or PHQ-9 in the last 360 days      Failed - Valid encounter within last 6 months    Recent Outpatient Visits           5 months ago Psoriasis   [place] [facility] Kayla Jeoffrey RAMAN, FNP   6 months ago Physical exam, annual   [place] [facility] Kayla Jeoffrey RAMAN, FNP   1 year ago Psoriasis   [place] [facility] Kayla Jeoffrey RAMAN, FNP   1 year ago Encounter to establish care with new doctor   [place] [facility] Kayla Jeoffrey RAMAN, FNP   3 years ago Chronic bilateral low back pain without sciatica   [facility], Maralee PARAS, FNP       Future Appointments             In 1 month Deveshwar, Maya, MD [facility] [place]. [facility]

## 2024-04-21 ENCOUNTER — Other Ambulatory Visit: Payer: Self-pay

## 2024-04-21 ENCOUNTER — Emergency Department (HOSPITAL_BASED_OUTPATIENT_CLINIC_OR_DEPARTMENT_OTHER)

## 2024-04-21 ENCOUNTER — Other Ambulatory Visit (HOSPITAL_BASED_OUTPATIENT_CLINIC_OR_DEPARTMENT_OTHER): Payer: Self-pay

## 2024-04-21 ENCOUNTER — Emergency Department (HOSPITAL_BASED_OUTPATIENT_CLINIC_OR_DEPARTMENT_OTHER): Admitting: Radiology

## 2024-04-21 ENCOUNTER — Encounter (HOSPITAL_BASED_OUTPATIENT_CLINIC_OR_DEPARTMENT_OTHER): Payer: Self-pay

## 2024-04-21 ENCOUNTER — Emergency Department (HOSPITAL_BASED_OUTPATIENT_CLINIC_OR_DEPARTMENT_OTHER)
Admission: EM | Admit: 2024-04-21 | Discharge: 2024-04-21 | Disposition: A | Discharge status: Home / Self Care | Attending: Emergency Medicine | Admitting: Emergency Medicine

## 2024-04-21 DIAGNOSIS — R0789 Other chest pain: Secondary | ICD-10-CM | POA: Insufficient documentation

## 2024-04-21 DIAGNOSIS — B0229 Other postherpetic nervous system involvement: Secondary | ICD-10-CM | POA: Diagnosis not present

## 2024-04-21 DIAGNOSIS — R059 Cough, unspecified: Secondary | ICD-10-CM | POA: Insufficient documentation

## 2024-04-21 DIAGNOSIS — Z79899 Other long term (current) drug therapy: Secondary | ICD-10-CM | POA: Insufficient documentation

## 2024-04-21 DIAGNOSIS — D72829 Elevated white blood cell count, unspecified: Secondary | ICD-10-CM | POA: Diagnosis not present

## 2024-04-21 DIAGNOSIS — I10 Essential (primary) hypertension: Secondary | ICD-10-CM | POA: Diagnosis not present

## 2024-04-21 LAB — CBC
HCT: 46.3 % — ABNORMAL HIGH (ref 36.0–46.0)
Hemoglobin: 15.9 g/dL — ABNORMAL HIGH (ref 12.0–15.0)
MCH: 30.8 pg (ref 26.0–34.0)
MCHC: 34.3 g/dL (ref 30.0–36.0)
MCV: 89.7 fL (ref 80.0–100.0)
Platelets: 368 K/uL (ref 150–400)
RBC: 5.16 MIL/uL — ABNORMAL HIGH (ref 3.87–5.11)
RDW: 14.2 % (ref 11.5–15.5)
WBC: 15.3 K/uL — ABNORMAL HIGH (ref 4.0–10.5)
nRBC: 0 % (ref 0.0–0.2)

## 2024-04-21 LAB — RESP PANEL BY RT-PCR (RSV, FLU A&B, COVID)  RVPGX2
Influenza A by PCR: NEGATIVE
Influenza B by PCR: NEGATIVE
Resp Syncytial Virus by PCR: NEGATIVE
SARS Coronavirus 2 by RT PCR: NEGATIVE

## 2024-04-21 LAB — BASIC METABOLIC PANEL WITH GFR
Anion gap: 17 — ABNORMAL HIGH (ref 5–15)
BUN: 10 mg/dL (ref 6–20)
CO2: 21 mmol/L — ABNORMAL LOW (ref 22–32)
Calcium: 10.3 mg/dL (ref 8.9–10.3)
Chloride: 101 mmol/L (ref 98–111)
Creatinine, Ser: 0.85 mg/dL (ref 0.44–1.00)
GFR, Estimated: >60 mL/min (ref >60)
Glucose, Bld: 149 mg/dL — ABNORMAL HIGH (ref 70–99)
Potassium: 3.8 mmol/L (ref 3.5–5.1)
Sodium: 139 mmol/L (ref 135–145)

## 2024-04-21 LAB — TROPONIN T, HIGH SENSITIVITY
Troponin T High Sensitivity: <15 ng/L (ref 0–19)
Troponin T High Sensitivity: <15 ng/L (ref 0–19)

## 2024-04-21 MED ORDER — MORPHINE SULFATE (PF) 4 MG/ML IV SOLN
4.0000 mg | Freq: Once | INTRAVENOUS | Status: AC
  Administered 2024-04-21: 4 mg via INTRAVENOUS
  Filled 2024-04-21: qty 1

## 2024-04-21 MED ORDER — KETOROLAC TROMETHAMINE 15 MG/ML IJ SOLN
15.0000 mg | Freq: Once | INTRAMUSCULAR | Status: AC
  Administered 2024-04-21: 15 mg via INTRAVENOUS
  Filled 2024-04-21: qty 1

## 2024-04-21 MED ORDER — LIDOCAINE VISCOUS HCL 2 % MT SOLN
15.0000 mL | Freq: Once | OROMUCOSAL | Status: AC
  Administered 2024-04-21: 15 mL via ORAL
  Filled 2024-04-21: qty 15

## 2024-04-21 MED ORDER — LORAZEPAM 1 MG PO TABS
1.0000 mg | ORAL_TABLET | Freq: Once | ORAL | Status: AC
  Administered 2024-04-21: 1 mg via ORAL
  Filled 2024-04-21: qty 1

## 2024-04-21 MED ORDER — IOHEXOL 350 MG/ML SOLN
75.0000 mL | Freq: Once | INTRAVENOUS | Status: AC | PRN
  Administered 2024-04-21: 76 mL via INTRAVENOUS

## 2024-04-21 MED ORDER — ALUM & MAG HYDROXIDE-SIMETH 200-200-20 MG/5ML PO SUSP
30.0000 mL | Freq: Once | ORAL | Status: AC
Start: 2024-04-21 — End: 2024-04-21
  Administered 2024-04-21: 30 mL via ORAL
  Filled 2024-04-21: qty 30

## 2024-04-21 MED ORDER — LIDOCAINE 5 % EX PTCH
1.0000 | MEDICATED_PATCH | CUTANEOUS | 0 refills | Status: AC
  Filled 2024-04-21: qty 30, 30d supply, fill #0

## 2024-04-21 MED ORDER — PANTOPRAZOLE SODIUM 20 MG PO TBEC
20.0000 mg | DELAYED_RELEASE_TABLET | Freq: Every day | ORAL | 0 refills | Status: AC
  Filled 2024-04-21: qty 30, 30d supply, fill #0

## 2024-04-21 MED ORDER — OXYCODONE HCL 5 MG PO TABS
5.0000 mg | ORAL_TABLET | Freq: Four times a day (QID) | ORAL | 0 refills | Status: DC | PRN
  Filled 2024-04-21: qty 15, 4d supply, fill #0

## 2024-04-21 MED ORDER — LIDOCAINE 5 % EX PTCH
1.0000 | MEDICATED_PATCH | CUTANEOUS | Status: DC
  Administered 2024-04-21: 1 via TRANSDERMAL
  Filled 2024-04-21: qty 1

## 2024-04-21 MED ORDER — FAMOTIDINE 20 MG PO TABS
20.0000 mg | ORAL_TABLET | Freq: Two times a day (BID) | ORAL | 0 refills | Status: AC
  Filled 2024-04-21: qty 30, 15d supply, fill #0

## 2024-04-21 NOTE — ED Provider Notes (Signed)
 [facility]MENT AT [facility] Provider Note   CSN: [id number] Arrival date & time: 04/21/24  9255     Patient presents with: Chest Pain   Theresa Sawyer is a 43 y.o. female.    Chest Pain    43 year old female with medical history significant for psoriasis, OSA on CPAP, obesity, generalized anxiety disorder, HTN, GERD, HLD, recent diagnosis of shingles presenting to the emergency department with chest pain.  The patient states that she developed left-sided chest pain starting around 3:00 this morning.  She states that the pain is a heaviness/pressure sensation located substernally and radiating to the left side of her chest.  She endorses persistent sharp pain associated with her shingles and states that this pain feels different.  Her rash from her shingles has been improving.  Endorses a mild cough, states that it is difficult to take a deep breath.  She denies any fever or chills.  Prior to Admission medications   Medication Sig Start Date End Date Taking? Authorizing Provider  famotidine  (PEPCID ) 20 MG tablet Take 1 tablet (20 mg total) by mouth 2 (two) times daily. 04/21/24  Yes Jerrol Agent, MD  lidocaine  (LIDODERM ) 5 % Place 1 patch onto the skin daily. Remove & Discard patch within 12 hours or as directed by MD 04/21/24  Yes Jerrol Agent, MD  oxyCODONE  (ROXICODONE ) 5 MG immediate release tablet Take 1 tablet (5 mg total) by mouth every 6 (six) hours as needed for severe pain (pain score 7-10). 04/21/24  Yes Jerrol Agent, MD  pantoprazole  (PROTONIX ) 20 MG tablet Take 1 tablet (20 mg total) by mouth daily. 04/21/24 05/21/24 Yes Jerrol Agent, MD  cyclobenzaprine  (FLEXERIL ) 10 MG tablet Take 1 tablet (10 mg total) by mouth 2 (two) times daily as needed for muscle spasms. 10/01/23   Jerrol Agent, MD  escitalopram  (LEXAPRO ) 20 MG tablet Take 1 tablet (20 mg total) by mouth daily. 04/10/24   Kayla Jeoffrey RAMAN, FNP  estradiol  (VIVELLE -DOT) 0.1 MG/24HR patch  Place 1 patch (0.1 mg total) onto the skin 2 (two) times a week. 08/17/22   Prentiss Annabella LABOR, NP  hydrochlorothiazide  (HYDRODIURIL ) 25 MG tablet Take 1 tablet (25 mg total) by mouth daily. 08/23/22   Kayla Jeoffrey RAMAN, FNP  metFORMIN  (GLUCOPHAGE -XR) 500 MG 24 hr tablet Take 1 tablet (500 mg total) by mouth 2 (two) times daily with a meal. 08/24/22   Kayla Jeoffrey RAMAN, FNP  rosuvastatin  (CRESTOR ) 20 MG tablet Take 0.5 tablets (10 mg total) by mouth daily. 08/24/22   Kayla Jeoffrey RAMAN, FNP  valsartan  (DIOVAN ) 80 MG tablet TAKE 1 AND 1/2 TABLETS BY MOUTH DAILY 12/22/23   Kayla Jeoffrey RAMAN, FNP  Vitamin D , Ergocalciferol , (DRISDOL ) 1.25 MG (50000 UNIT) CAPS capsule TAKE 1 CAPSULE BY MOUTH EVERY 7 DAYS 06/10/21   Purcell Emil Schanz, MD    Allergies: Patient has no known allergies.    Review of Systems  Cardiovascular:  Positive for chest pain.  All other systems reviewed and are negative.   Updated Vital Signs BP 103/71   Pulse 69   Temp 98.8 F (37.1 C)   Resp 11   Ht 5' 5 (1.651 m)   Wt 108.9 kg   LMP 09/01/2020   SpO2 100%   BMI 39.94 kg/m   Physical Exam Vitals and nursing note reviewed.  Constitutional:      General: She is not in acute distress.    Appearance: She is well-developed. She is obese.  HENT:  Head: Normocephalic and atraumatic.  Eyes:     Conjunctiva/sclera: Conjunctivae normal.  Cardiovascular:     Rate and Rhythm: Normal rate and regular rhythm.     Heart sounds: No murmur heard. Pulmonary:     Effort: Pulmonary effort is normal. No respiratory distress.     Breath sounds: Normal breath sounds.  Chest:     Comments: Left-sided chest wall shingles lesions appear to be healing Abdominal:     Palpations: Abdomen is soft.     Tenderness: There is no abdominal tenderness.  Musculoskeletal:        General: No swelling.     Cervical back: Neck supple.  Skin:    General: Skin is warm and dry.     Capillary Refill: Capillary refill takes less than 2 seconds.   Neurological:     Mental Status: She is alert.  Psychiatric:        Mood and Affect: Mood normal.     (all labs ordered are listed, but only abnormal results are displayed) Labs Reviewed  BASIC METABOLIC PANEL WITH GFR - Abnormal; Notable for the following components:      Result Value   CO2 21 (*)    Glucose, Bld 149 (*)    Anion gap 17 (*)    All other components within normal limits  CBC - Abnormal; Notable for the following components:   WBC 15.3 (*)    RBC 5.16 (*)    Hemoglobin 15.9 (*)    HCT 46.3 (*)    All other components within normal limits  RESP PANEL BY RT-PCR (RSV, FLU A&B, COVID)  RVPGX2  TROPONIN T, HIGH SENSITIVITY  TROPONIN T, HIGH SENSITIVITY    EKG: EKG Interpretation Date/Time:  Monday April 21 2024 07:49:08 EDT Ventricular Rate:  82 PR Interval:  134 QRS Duration:  84 QT Interval:  362 QTC Calculation: 422 R Axis:   62  Text Interpretation: Normal sinus rhythm Normal ECG When compared with ECG of 01-Oct-2023 08:07, PREVIOUS ECG IS PRESENT Confirmed by Jerrol Agent ([id number]) on 04/21/2024 8:39:57 AM  Radiology: CT Angio Chest PE W and/or Wo Contrast Result Date: 04/21/2024 CLINICAL DATA:  Left-sided chest pain. EXAM: CT ANGIOGRAPHY CHEST WITH CONTRAST TECHNIQUE: Multidetector CT imaging of the chest was performed using the standard protocol during bolus administration of intravenous contrast. Multiplanar CT image reconstructions and MIPs were obtained to evaluate the vascular anatomy. RADIATION DOSE REDUCTION: This exam was performed according to the departmental dose-optimization program which includes automated exposure control, adjustment of the mA and/or kV according to patient size and/or use of iterative reconstruction technique. CONTRAST:  76mL OMNIPAQUE  IOHEXOL  350 MG/ML SOLN COMPARISON:  October 01, 2023 FINDINGS: Cardiovascular: Satisfactory opacification of the pulmonary arteries to the segmental level. No evidence of pulmonary embolism.  Normal heart size. No pericardial effusion. Mediastinum/Nodes: No enlarged mediastinal, hilar, or axillary lymph nodes. The trachea and esophagus demonstrate no significant findings. A 1.9 cm low-attenuation thyroid  nodule is seen within the left lobe of the thyroid  gland. Lungs/Pleura: Lungs are clear. No pleural effusion or pneumothorax. Upper Abdomen: There is diffuse fatty infiltration of the liver parenchyma. Multiple surgical clips are seen within the gallbladder fossa. Musculoskeletal: No chest wall abnormality. No acute or significant osseous findings. Review of the MIP images confirms the above findings. IMPRESSION: 1. No evidence of pulmonary embolism or acute cardiopulmonary disease. 2. Hepatic steatosis. 3. Evidence of prior cholecystectomy. 4. Stable low-attenuation left thyroid  nodule. Correlation with nonemergent thyroid  ultrasound is recommended. Electronically Signed  By: Suzen Dials M.D.   On: 04/21/2024 10:13   DG Chest 2 View Result Date: 04/21/2024 CLINICAL DATA:  Chest pain EXAM: CHEST - 2 VIEW COMPARISON:  10/01/2023 FINDINGS: The lungs are clear without focal pneumonia, edema, pneumothorax or pleural effusion. The cardiopericardial silhouette is within normal limits for size. No acute bony abnormality. Telemetry leads overlie the chest. IMPRESSION: No active cardiopulmonary disease. Electronically Signed   By: Camellia Candle M.D.   On: 04/21/2024 08:59     Procedures   Medications Ordered in the ED  morphine  (PF) 4 MG/ML injection 4 mg (4 mg Intravenous Given 04/21/24 0931)  iohexol  (OMNIPAQUE ) 350 MG/ML injection 75 mL (76 mLs Intravenous Contrast Given 04/21/24 0940)  LORazepam  (ATIVAN ) tablet 1 mg (1 mg Oral Given 04/21/24 1045)  ketorolac  (TORADOL ) 15 MG/ML injection 15 mg (15 mg Intravenous Given 04/21/24 1045)  alum & mag hydroxide-simeth (MAALOX/MYLANTA) 200-200-20 MG/5ML suspension 30 mL (30 mLs Oral Given 04/21/24 1130)    And  lidocaine  (XYLOCAINE ) 2 % viscous  mouth solution 15 mL (15 mLs Oral Given 04/21/24 1130)                                    Medical Decision Making Amount and/or Complexity of Data Reviewed Labs: ordered. Radiology: ordered.  Risk OTC drugs. Prescription drug management.     43 year old female with medical history significant for psoriasis, OSA on CPAP, obesity, generalized anxiety disorder, HTN, GERD, HLD, recent diagnosis of shingles presenting to the emergency department with chest pain.  The patient states that she developed left-sided chest pain starting around 3:00 this morning.  She states that the pain is a heaviness/pressure sensation located substernally and radiating to the left side of her chest.  She endorses persistent sharp pain associated with her shingles and states that this pain feels different.  Her rash from her shingles has been improving.  Endorses a mild cough, states that it is difficult to take a deep breath.  She denies any fever or chills.  On arrival, the patient was afebrile, not tachycardic or tachypneic, saturating well on room air, hemodynamically stable.  Patient presenting with left-sided chest tenderness and mass, pain in the setting of her previous shingles infection.  Patient likely experiencing postherpetic neuralgia in addition to musculoskeletal pain.  Cardiac workup initiated to evaluate for ACS.  Will obtain CT imaging to evaluate for PE.  EKG: Sinus rhythm, ventricular rate 82, no abnormal intervals, no acute ischemic changes.  CXR: No active cardiopulmonary disease, no pneumothorax or pneumonia, no rib fracture.  Labs: COVID, flu, RSV PCR testing collected and negative, cardiac troponin normal x 2, CBC suggesting hemoconcentration with a hemoglobin of 15.9 and a leukocytosis to 15.3, BMP with a mild anion gap acidosis with a bicarbonate of 21, anion gap of 17, normal renal function.  Patient was administered morphine , subsequently Toradol  for pain control.  She endorsed anxiety  and was administered an Ativan  tablet.  CTA PE study: No evidence of pulmonary embolism or acute cardiopulmonary disease, hepatic steatosis noted, evidence of prior cholecystectomy noted, stable low-attenuation left thyroid  nodule also noted.  Patient feeling symptomatically improved on repeat assessment, did describe a burning-like discomfort, suspected possible GERD in addition to the patient's likely musculoskeletal chest pain and postherpetic neuralgia, administered GI cocktail.  Feeling symptomatically improved, overall reassuring workup at this time with normal cardiac troponins, negative CT imaging for PE.  Patient presided  with Protonix  and Pepcid  for treatment for GERD, advised lidocaine  patch for chest wall pain and postherpetic neuralgia, oxycodone  5 mg also prescribed.  Patient stable for discharge at this time, advised outpatient follow-up.     Final diagnoses:  Chest wall pain  Post herpetic neuralgia    ED Discharge Orders          Ordered    oxyCODONE  (ROXICODONE ) 5 MG immediate release tablet  Every 6 hours PRN        04/21/24 1113    lidocaine  (LIDODERM ) 5 %  Every 24 hours        04/21/24 1113    pantoprazole  (PROTONIX ) 20 MG tablet  Daily        04/21/24 1113    famotidine  (PEPCID ) 20 MG tablet  2 times daily        04/21/24 1113               Jerrol Agent, MD 04/21/24 1839

## 2024-04-21 NOTE — ED Notes (Signed)
 DC paperwork given and verbally understood.

## 2024-04-21 NOTE — Discharge Instructions (Addendum)
 Your ED workup was overall reassuring with negative CT imaging and reassuring laboratory evaluation.  Your symptoms could be due to gastric reflux which we will start you on Protonix  and add on Pepcid .  Additionally, you are experiencing likely postherpetic neuralgia after your shingles outbreak.  We will treat you with a course of opiates in addition to lidocaine  patches.

## 2024-04-21 NOTE — ED Notes (Signed)
 Toradol  cleared by Provider... Pt confirmed not Preg.

## 2024-04-21 NOTE — ED Triage Notes (Signed)
 C/o left sided CP starting around 0300 this morning. Denies SHOB.

## 2024-04-22 ENCOUNTER — Encounter: Payer: Self-pay | Admitting: Rheumatology

## 2024-04-22 ENCOUNTER — Ambulatory Visit

## 2024-04-22 ENCOUNTER — Ambulatory Visit (INDEPENDENT_AMBULATORY_CARE_PROVIDER_SITE_OTHER): Admitting: Family Medicine

## 2024-04-22 ENCOUNTER — Encounter: Payer: Self-pay | Admitting: *Deleted

## 2024-04-22 ENCOUNTER — Telehealth (HOSPITAL_BASED_OUTPATIENT_CLINIC_OR_DEPARTMENT_OTHER): Payer: Self-pay

## 2024-04-22 ENCOUNTER — Telehealth: Payer: Self-pay | Admitting: Pharmacist

## 2024-04-22 ENCOUNTER — Encounter: Payer: Self-pay | Admitting: Family Medicine

## 2024-04-22 ENCOUNTER — Other Ambulatory Visit (HOSPITAL_COMMUNITY): Payer: Self-pay

## 2024-04-22 ENCOUNTER — Encounter (HOSPITAL_BASED_OUTPATIENT_CLINIC_OR_DEPARTMENT_OTHER): Payer: Self-pay

## 2024-04-22 ENCOUNTER — Ambulatory Visit: Attending: Rheumatology | Admitting: Rheumatology

## 2024-04-22 VITALS — BP 130/85 | HR 75 | Temp 98.5°F | Ht 66.0 in | Wt 224.6 lb

## 2024-04-22 VITALS — BP 116/77 | HR 69 | Temp 97.8°F | Resp 16 | Ht 66.0 in | Wt 224.4 lb

## 2024-04-22 DIAGNOSIS — R748 Abnormal levels of other serum enzymes: Secondary | ICD-10-CM | POA: Diagnosis not present

## 2024-04-22 DIAGNOSIS — Z79899 Other long term (current) drug therapy: Secondary | ICD-10-CM

## 2024-04-22 DIAGNOSIS — M79641 Pain in right hand: Secondary | ICD-10-CM

## 2024-04-22 DIAGNOSIS — M79672 Pain in left foot: Secondary | ICD-10-CM | POA: Diagnosis not present

## 2024-04-22 DIAGNOSIS — R7303 Prediabetes: Secondary | ICD-10-CM

## 2024-04-22 DIAGNOSIS — M533 Sacrococcygeal disorders, not elsewhere classified: Secondary | ICD-10-CM

## 2024-04-22 DIAGNOSIS — K219 Gastro-esophageal reflux disease without esophagitis: Secondary | ICD-10-CM

## 2024-04-22 DIAGNOSIS — L603 Nail dystrophy: Secondary | ICD-10-CM

## 2024-04-22 DIAGNOSIS — G4733 Obstructive sleep apnea (adult) (pediatric): Secondary | ICD-10-CM

## 2024-04-22 DIAGNOSIS — L081 Erythrasma: Secondary | ICD-10-CM | POA: Diagnosis not present

## 2024-04-22 DIAGNOSIS — B379 Candidiasis, unspecified: Secondary | ICD-10-CM

## 2024-04-22 DIAGNOSIS — M25561 Pain in right knee: Secondary | ICD-10-CM

## 2024-04-22 DIAGNOSIS — M79671 Pain in right foot: Secondary | ICD-10-CM

## 2024-04-22 DIAGNOSIS — M79642 Pain in left hand: Secondary | ICD-10-CM

## 2024-04-22 DIAGNOSIS — G8929 Other chronic pain: Secondary | ICD-10-CM

## 2024-04-22 DIAGNOSIS — M25562 Pain in left knee: Secondary | ICD-10-CM

## 2024-04-22 DIAGNOSIS — E041 Nontoxic single thyroid nodule: Secondary | ICD-10-CM | POA: Diagnosis not present

## 2024-04-22 DIAGNOSIS — L409 Psoriasis, unspecified: Secondary | ICD-10-CM | POA: Diagnosis not present

## 2024-04-22 DIAGNOSIS — F17200 Nicotine dependence, unspecified, uncomplicated: Secondary | ICD-10-CM

## 2024-04-22 DIAGNOSIS — K2100 Gastro-esophageal reflux disease with esophagitis, without bleeding: Secondary | ICD-10-CM

## 2024-04-22 DIAGNOSIS — I10 Essential (primary) hypertension: Secondary | ICD-10-CM

## 2024-04-22 DIAGNOSIS — E559 Vitamin D deficiency, unspecified: Secondary | ICD-10-CM

## 2024-04-22 DIAGNOSIS — M170 Bilateral primary osteoarthritis of knee: Secondary | ICD-10-CM

## 2024-04-22 DIAGNOSIS — R198 Other specified symptoms and signs involving the digestive system and abdomen: Secondary | ICD-10-CM

## 2024-04-22 DIAGNOSIS — L4050 Arthropathic psoriasis, unspecified: Secondary | ICD-10-CM | POA: Diagnosis not present

## 2024-04-22 DIAGNOSIS — G43009 Migraine without aura, not intractable, without status migrainosus: Secondary | ICD-10-CM

## 2024-04-22 DIAGNOSIS — E785 Hyperlipidemia, unspecified: Secondary | ICD-10-CM

## 2024-04-22 DIAGNOSIS — F32A Depression, unspecified: Secondary | ICD-10-CM

## 2024-04-22 DIAGNOSIS — Z840 Family history of diseases of the skin and subcutaneous tissue: Secondary | ICD-10-CM

## 2024-04-22 DIAGNOSIS — I700 Atherosclerosis of aorta: Secondary | ICD-10-CM

## 2024-04-22 DIAGNOSIS — F419 Anxiety disorder, unspecified: Secondary | ICD-10-CM

## 2024-04-22 MED ORDER — CLINDAMYCIN PHOSPHATE 2 % VA CREA: 1.0000 | TOPICAL_CREAM | Freq: Every day | VAGINAL | 0 refills | Status: DC

## 2024-04-22 MED ORDER — CLINDAMYCIN PHOS (TWICE-DAILY) 1 % EX GEL: Freq: Two times a day (BID) | CUTANEOUS | 0 refills | Status: DC

## 2024-04-22 NOTE — Assessment & Plan Note (Signed)
 Continue omeprazole  and protonix . Referral to GI per Rheum. Let me know if I need to place this order. Elevated HOB if needed and avoid lying down 2-3 hours after eating, avoid coffee, alcohol, chocolate, fatty foods, citrus, carbonated beverages, spicy foods, late meals, and smoking. Return to office if symptoms return or worsen and seek medical care for difficulty swallowing, bleeding, anemia, weight loss, or recurrent vomiting.

## 2024-04-22 NOTE — Assessment & Plan Note (Signed)
 Start clindamycin gel BID. Follow up PRN

## 2024-04-22 NOTE — Progress Notes (Signed)
 Subjective:  HPI: Theresa Sawyer is a 43 y.o. female presenting on 04/22/2024 for Hospitalization Follow-up (04/21/2024 for chest pain / Having a full body rash that is possibly shingles )   HPI Patient is in today for follow up on recent ED visit 04/21/2024 for chest pain. She does have ongoing intermittent substernal chest pain. Described as burning. Has been less severe. She also has sore throat from her acid reflux. Is planning to see GI as referred by Rheumatology. Low suspicion of cardiac etiology given negative cardiac workup in ED. Encouraged to avoid triggers of GERD, continue omeprazole  and protonix  as prescribed, and follow up with GI given severity of symptoms.   Ongoing rash s/p zoster infection. Rash under left breast. Itching. Not spreading.      HPI 04/21/2024 for reference only: 43 year old female with medical history significant for psoriasis, OSA on CPAP, obesity, generalized anxiety disorder, HTN, GERD, HLD, recent diagnosis of shingles presenting to the emergency department with chest pain.  The patient states that she developed left-sided chest pain starting around 3:00 this morning.  She states that the pain is a heaviness/pressure sensation located substernally and radiating to the left side of her chest.  She endorses persistent sharp pain associated with her shingles and states that this pain feels different.  Her rash from her shingles has been improving.  Endorses a mild cough, states that it is difficult to take a deep breath.  She denies any fever or chills.   On arrival, the patient was afebrile, not tachycardic or tachypneic, saturating well on room air, hemodynamically stable.  Patient presenting with left-sided chest tenderness and mass, pain in the setting of her previous shingles infection.  Patient likely experiencing postherpetic neuralgia in addition to musculoskeletal pain.  Cardiac workup initiated to evaluate for ACS.  Will obtain CT imaging to evaluate  for PE.   EKG: Sinus rhythm, ventricular rate 82, no abnormal intervals, no acute ischemic changes.   CXR: No active cardiopulmonary disease, no pneumothorax or pneumonia, no rib fracture.   Labs: COVID, flu, RSV PCR testing collected and negative, cardiac troponin normal x 2, CBC suggesting hemoconcentration with a hemoglobin of 15.9 and a leukocytosis to 15.3, BMP with a mild anion gap acidosis with a bicarbonate of 21, anion gap of 17, normal renal function.   Patient was administered morphine , subsequently Toradol  for pain control.  She endorsed anxiety and was administered an Ativan  tablet.   CTA PE study: No evidence of pulmonary embolism or acute cardiopulmonary disease, hepatic steatosis noted, evidence of prior cholecystectomy noted, stable low-attenuation left thyroid  nodule also noted.   Patient feeling symptomatically improved on repeat assessment, did describe a burning-like discomfort, suspected possible GERD in addition to the patient's likely musculoskeletal chest pain and postherpetic neuralgia, administered GI cocktail.  Feeling symptomatically improved, overall reassuring workup at this time with normal cardiac troponins, negative CT imaging for PE.   Patient presided with Protonix  and Pepcid  for treatment for GERD, advised lidocaine  patch for chest wall pain and postherpetic neuralgia, oxycodone  5 mg also prescribed.  Patient stable for discharge at this time, advised outpatient follow-up.  Review of Systems  All other systems reviewed and are negative.   Relevant past medical history reviewed and updated as indicated.   Past Medical History:  Diagnosis Date   Abnormal uterine bleeding (AUB)    Anxiety    Arthritis    oa   Depression    GERD (gastroesophageal reflux disease)    History of  chest pain (10-20-2019  pt denies any cardiac s&s)   mutliple ED visits, atypical chest pain, chest wall pain, muscularskeletal chest pain;  pt had cardiology evaulation dated  04-23-2018 note in epic , state atypical chest wall pain with negative d dimer and troponin's multiple times in epic, suggested CTA  (pt did not get done)   Hyperlipidemia    Hypertension    IDA (iron deficiency anemia)    none recently   Menorrhagia    Migraine    OSA on CPAP    followed by dr ather cpap set on 7 to 14   Osteoporosis    Psoriasis    Sleep apnea    Urinary frequency      Past Surgical History:  Procedure Laterality Date   ABDOMINAL HYSTERECTOMY     CHOLECYSTECTOMY N/A 10/25/2021   Procedure: LAPAROSCOPIC CHOLECYSTECTOMY with INTRAOPERATIVE CHOLANGIOGRAM;  Surgeon: Sheldon Standing, MD;  Location: [facility];  Service: General;  Laterality: N/A;   DILATATION & CURETTAGE/HYSTEROSCOPY WITH MYOSURE N/A 10/24/2019   Procedure: DILATATION & CURETTAGE/HYSTEROSCOPY WITH MYOSURE;  Surgeon: Arnaldo Purchase, MD;  Location: [facility];  Service: Gynecology;  Laterality: N/A;   HYSTEROSCOPY WITH NOVASURE N/A 04/06/2020   Procedure: DILATION AND CURRETTAGE; NOVASURE ABLATION;  Surgeon: Arnaldo Purchase, MD;  Location: [facility];  Service: Gynecology;  Laterality: N/A;   LAPAROSCOPY N/A 04/06/2020   Procedure: LAPAROSCOPY OPERATIVE; LYSIS OF ADHESION;  Surgeon: Arnaldo Purchase, MD;  Location: [facility] [place];  Service: Gynecology;  Laterality: N/A;   ROBOTIC ASSISTED TOTAL HYSTERECTOMY WITH BILATERAL SALPINGO OOPHERECTOMY Bilateral 09/28/2020   Procedure: XI ROBOTIC ASSISTED TOTAL LAPAROSCOPIC HYSTERECTOMY WITH BILATERAL SALPINGO OOPHORECTOMY;  Surgeon: Lavoie, Marie-Lyne, MD;  Location: [facility] ;  Service: Gynecology;  Laterality: Bilateral;  request 8:30am OR start time in IQUEUE held time for Dr. Lavoie requests 2 hours KATHY CONFIRMED ON 2/10 W/NICK FOR  TRACY (RNFA) TO ASSIST   TUBAL LIGATION Bilateral 02/05/2013   Procedure: POST PARTUM TUBAL LIGATION;  Surgeon: Lynwood KANDICE Solomons, MD;  Location: [facility];  Service: Gynecology;   Laterality: Bilateral;    Filshie clips    Allergies and medications reviewed and updated.   Current Outpatient Medications:    clindamycin (CLINDAGEL) 1 % gel, Apply topically 2 (two) times daily., Disp: 30 g, Rfl: 0   escitalopram  (LEXAPRO ) 20 MG tablet, Take 1 tablet (20 mg total) by mouth daily., Disp: 90 tablet, Rfl: 1   famotidine  (PEPCID ) 20 MG tablet, Take 1 tablet (20 mg total) by mouth 2 (two) times daily., Disp: 30 tablet, Rfl: 0   lidocaine  (LIDODERM ) 5 %, Place 1 patch onto the skin daily. Remove & Discard patch within 12 hours or as directed by MD, Disp: 30 patch, Rfl: 0   metFORMIN  (GLUCOPHAGE -XR) 500 MG 24 hr tablet, Take 1 tablet (500 mg total) by mouth 2 (two) times daily with a meal., Disp: 180 tablet, Rfl: 3   oxyCODONE  (ROXICODONE ) 5 MG immediate release tablet, Take 1 tablet (5 mg total) by mouth every 6 (six) hours as needed for severe pain (pain score 7-10)., Disp: 15 tablet, Rfl: 0   pantoprazole  (PROTONIX ) 20 MG tablet, Take 1 tablet (20 mg total) by mouth daily., Disp: 30 tablet, Rfl: 0   valsartan  (DIOVAN ) 80 MG tablet, TAKE 1 AND 1/2 TABLETS BY MOUTH DAILY, Disp: 60 tablet, Rfl: 0  Allergies  Allergen Reactions   Ibuprofen      Elevated LFTs and upset stomach    Tylenol  [Acetaminophen ]  Elevated LFTs and upset stomach     Objective:   BP 130/85   Pulse 75   Temp 98.5 F (36.9 C)   Ht 5' 6 (1.676 m)   Wt 224 lb 9.6 oz (101.9 kg)   LMP 09/01/2020   SpO2 98%   BMI 36.25 kg/m      04/22/2024    3:41 PM 04/22/2024    8:24 AM 04/22/2024    8:13 AM  Vitals with BMI  Height 5' 6  5' 6  Weight 224 lbs 10 oz  224 lbs 6 oz  BMI 36.27  36.24  Systolic 130 116 875  Diastolic 85 77 85  Pulse 75 69 70     Physical Exam Vitals and nursing note reviewed.  Constitutional:      Appearance: Normal appearance. She is normal weight.  HENT:     Head: Normocephalic and atraumatic.  Cardiovascular:     Rate and Rhythm: Normal rate and regular  rhythm.     Pulses: Normal pulses.     Heart sounds: Normal heart sounds.  Pulmonary:     Effort: Pulmonary effort is normal.     Breath sounds: Normal breath sounds.  Skin:    General: Skin is warm and dry.         Comments: Light brown dry, wrinkling rash, well defined borders  Neurological:     General: No focal deficit present.     Mental Status: She is alert and oriented to person, place, and time. Mental status is at baseline.  Psychiatric:        Mood and Affect: Mood normal.        Behavior: Behavior normal.        Thought Content: Thought content normal.        Judgment: Judgment normal.     Assessment & Plan:  Erythrasma Assessment & Plan: Start clindamycin gel BID. Follow up PRN   Thyroid  nodule Assessment & Plan: TSH today. Endorsing worsening fatigue  Orders: -     TSH  Prediabetes Assessment & Plan: A1c today  Orders: -     Hemoglobin A1c  Gastroesophageal reflux disease, unspecified whether esophagitis present Assessment & Plan: Continue omeprazole  and protonix . Referral to GI per Rheum. Let me know if I need to place this order. Elevated HOB if needed and avoid lying down 2-3 hours after eating, avoid coffee, alcohol, chocolate, fatty foods, citrus, carbonated beverages, spicy foods, late meals, and smoking. Return to office if symptoms return or worsen and seek medical care for difficulty swallowing, bleeding, anemia, weight loss, or recurrent vomiting.     Other orders -     Clindamycin Phos (Twice-Daily); Apply topically 2 (two) times daily.  Dispense: 30 g; Refill: 0     Follow up plan: Return for chronic follow-up with labs 1 week prior.  Jeoffrey GORMAN Barrio, FNP

## 2024-04-22 NOTE — Telephone Encounter (Addendum)
 Submitted a Prior Authorization request to [employer] for [facility] via fax on CoverMyMeds. Will update once we receive a response.  Key: [id number]    ----- Message from Sherry GORMAN Pennant sent at 04/22/2024 10:03 AM EDT ----- Patient will be Tremfya new start. Dose will be Tremfya 100 mg on weeks 0 and 4 then every 8 weeks thereafter.  Prescription pending lab results and insurance approval.   Will need what may be a possible yeast infection resolved prior to starting treatment.  If Nelma is preferred, ok to switch to Skyrizi.  Consent also obtained for Humira - but not preferred due to higher risk for neutropenia and infection

## 2024-04-22 NOTE — Assessment & Plan Note (Signed)
 TSH today. Endorsing worsening fatigue

## 2024-04-22 NOTE — Patient Instructions (Addendum)
 Standing Labs We placed an order today for your standing lab work.   Please have your standing labs drawn in 1 month after starting Tremfya and then every 3 months  Please have your labs drawn 2 weeks prior to your appointment so that the provider can discuss your lab results at your appointment, if possible.  Please note that you may see your imaging and lab results in MyChart before we have reviewed them. We will contact you once all results are reviewed. Please allow our office up to 72 hours to thoroughly review all of the results before contacting the office for clarification of your results.  WALK-IN LAB HOURS  Monday through Thursday from 8:00 am -12:30 pm and 1:00 pm-4:30 pm and Friday from 8:00 am-12:00 pm.  Patients with office visits requiring labs will be seen before walk-in labs.  You may encounter longer than normal wait times. Please allow additional time. Wait times may be shorter on  Monday and Thursday afternoons.  We do not book appointments for walk-in labs. We appreciate your patience and understanding with our staff.   Labs are drawn by [facility]. Please bring your co-pay at the time of your lab draw.  You may receive a bill from [facility] for your lab work.  Please note if you are on Hydroxychloroquine and and an order has been placed for a Hydroxychloroquine level,  you will need to have it drawn 4 hours or more after your last dose.  If you wish to have your labs drawn at another location, please call the office 24 hours in advance so we can fax the orders.  The office is located at [address], [place], [place] [address]   If you have any questions regarding directions or hours of operation,  please call [phone].   As a reminder, please drink plenty of water prior to coming for your lab work. Thanks!   Vaccines You are taking a medication(s) that can suppress your immune system.  The following immunizations are recommended: Flu annually (high  dose) Covid-19  RSV HPV Td/Tdap (tetanus, diphtheria, pertussis) every 10 years Pneumonia (Prevnar 15 then Pneumovax 23 at least 1 year apart.  Alternatively, can take Prevnar 20 without needing additional dose) Shingrix: 2 doses from 4 weeks to 6 months apart (recommended for immunocompromised patients over age 33)  Please check with your PCP to make sure you are up to date.   If you have signs or symptoms of an infection or start antibiotics: First, call your PCP for workup of your infection. Hold your medication through the infection, until you complete your antibiotics, and until symptoms resolve if you take the following: Injectable medication (Actemra, Benlysta, Cimzia, Cosentyx, Enbrel, Humira, Kevzara, Orencia, Remicade, Simponi, Stelara, Taltz, Tremfya) Methotrexate Leflunomide (Arava) Mycophenolate (Cellcept) Xeljanz, Olumiant, or Rinvoq   Guselkumab Injection What is this medication? GUSELKUMAB (goo SELK ue mab) treats autoimmune conditions, such as psoriasis, arthritis, Crohn disease, and ulcerative colitis. It works by slowing down an overactive immune system.  It is a monoclonal antibody. This medicine may be used for other purposes; ask your health care provider or pharmacist if you have questions. COMMON BRAND NAME(S): Candido Candido PEN What should I tell my care team before I take this medication? They need to know if you have any of these conditions: Have tuberculosis (TB) or have been in close contact with someone who does Infection that does not go away or keeps coming back Liver disease Recent or upcoming vaccine An  unusual or allergic reaction to guselkumab, other medications, foods, dyes, or preservatives Pregnant or trying to get pregnant Breastfeeding How should I use this medication? This medication is injected under the skin. It is usually given by a care team in a hospital or clinic setting. It may also be given at home. If you get this medication at  home, you will be taught how to prepare and give it. Use exactly as directed. Take it as directed on the prescription label. Keep taking it unless your care team tells you to stop. It is important that you put your used needles and syringes in a special sharps container. Do not put them in a trash can. If you do not have a sharps container, call your pharmacist or care team to get one. This medication comes with INSTRUCTIONS FOR USE. Ask your pharmacist for directions on how to use this medication. Read the information carefully. Talk to your pharmacist or care team if you have questions. A special MedGuide will be given to you by the pharmacist with each prescription and refill. Be sure to read this information carefully each time. Talk to your care team about the use of this medication in children. Special care may be needed. Overdosage: If you think you have taken too much of this medicine contact a poison control center or emergency room at once. NOTE: This medicine is only for you. Do not share this medicine with others. What if I miss a dose? If you get this medication at a hospital or clinic: It is important not to miss your dose. Call your care team if you are unable to keep an appointment. If you give yourself this medication at home: If you miss a dose, take it as soon as you can. Then continue your normal schedule. If it is almost time for your next dose, take only that dose. Do not take double or extra doses. Call your care team with questions. What may interact with this medication? Do not take this medication with any of the following: Live virus vaccines This medication may also interact with the following: Amoxapine Certain medications for depression, anxiety, or mental health conditions, such as amitriptyline, clomipramine, desipramine, doxepin, imipramine, maprotiline, nortriptyline, protriptyline, trimipramine Codeine Inactivated vaccines Methadone Pimozide Thioridazine This  list may not describe all possible interactions. Give your health care provider a list of all the medicines, herbs, non-prescription drugs, or dietary supplements you use. Also tell them if you smoke, drink alcohol, or use illegal drugs. Some items may interact with your medicine. What should I watch for while using this medication? Your condition will be monitored carefully while you are receiving this medication. Tell your care team if your symptoms do not start to get better or if they get worse. You will be tested for tuberculosis (TB) before you start this medication. If your care team prescribes any medication for TB, you should start taking the TB medication before starting this medication. Make sure to finish the full course of TB medication. This medication may increase your risk of getting an infection. Call your care team for advice if you get a fever, chills, sore throat, or other symptoms of a cold or flu. Do not treat yourself. Try to avoid being around people who are sick. What side effects may I notice from receiving this medication? Side effects that you should report to your care team as soon as possible: Allergic reactions--skin rash, itching, hives, swelling of the face, lips, tongue, or throat Infection--fever, chills,  cough, sore throat, wounds that don't heal, pain or trouble when passing urine, general feeling of discomfort or being unwell Liver injury--right upper belly pain, loss of appetite, nausea, light-colored stool, dark yellow or brown urine, yellowing skin or eyes, unusual weakness or fatigue Side effects that usually do not require medical attention (report these to your care team if they continue or are bothersome): Diarrhea Headache Joint pain Pain, redness, or irritation at injection site Runny or stuffy nose Sore throat This list may not describe all possible side effects. Call your doctor for medical advice about side effects. You may report side effects to FDA  at 1-800-FDA-1088. Where should I keep my medication? Keep out of the reach of children and pets. Store in the refrigerator. Do not freeze. Keep it in the original container until you are ready to use. Protect from light. Do not shake. Remove the dose from the refrigerator about 30 minutes prior to use. Get rid of any unused medication after the expiration date on the label. To get rid of medications that are no longer needed or have expired: Take the medication to a medication take-back program. Check with your pharmacy or law enforcement to find a location. If you cannot return the medication, ask your pharmacist or care team how to get rid of this medication safely. NOTE: This sheet is a summary. It may not cover all possible information. If you have questions about this medicine, talk to your doctor, pharmacist, or health care provider.  2025 Elsevier/Gold Standard (2023-10-02 00:00:00)  Adalimumab Injection What is this medication? ADALIMUMAB (ay da LIM yoo mab) treats autoimmune conditions, such as psoriasis, arthritis, Crohn disease, and ulcerative colitis. It works by slowing down an overactive immune system.  It may also be used to treat hidradenitis suppurativa (HS). HS is a condition that causes painful lumps under the skin in areas such as the armpits and groin. It belongs to a group of medications called TNF inhibitors. It is a monoclonal antibody. This medicine may be used for other purposes; ask your health care provider or pharmacist if you have questions. COMMON BRAND NAME(S): ABRILADA, AMJEVITA, CYLTEZO, HADLIMA, Hulio, Hulio PEN, Humira, HUMIRA PEN, Hyrimoz, Idacio, Simlandi, Yuflyma, YUSIMRY What should I tell my care team before I take this medication? They need to know if you have any of these conditions: Cancer Diabetes (high blood sugar) Having surgery Heart disease Hepatitis B Immune system problems Infections, such as tuberculosis (TB) or other bacterial, fungal, or  viral infections Multiple sclerosis Recent or upcoming vaccine An unusual or allergic reaction to adalimumab, mannitol, latex, rubber, other medications, foods, dyes, or preservatives Pregnant or trying to get pregnant Breast-feeding How should I use this medication? This medication is injected under the skin. It may be given by your care team in a hospital or clinic setting. It may also be given at home. If you get this medication at home, you will be taught how to prepare and give it. Use exactly as directed. Take it as directed on the prescription label. Keep taking it unless your care team tells you to stop. This medication comes with INSTRUCTIONS FOR USE. Ask your pharmacist for directions on how to use this medication. Read the information carefully. Talk to your pharmacist or care team if you have questions. It is important that you put your used needles and syringes in a special sharps container. Do not put them in a trash can. If you do not have a sharps container, call your pharmacist or  care team to get one. A special MedGuide will be given to you by the pharmacist with each prescription and refill. If you are getting this medication in a hospital or clinic, a special MedGuide will be given to you before each treatment. Be sure to read this information carefully each time. Talk to your care team about the use of this medication in children. While it be prescribed for children as young as 2 years for selected conditions, precautions do apply. Overdosage: If you think you have taken too much of this medicine contact a poison control center or emergency room at once. NOTE: This medicine is only for you. Do not share this medicine with others. What if I miss a dose? If you get this medication at the hospital or clinic: it is important not to miss your dose. Call your care team if you are unable to keep an appointment. If you give yourself this medication at home: If you miss a dose, take it  as soon as you can. If it is almost time for your next dose, take only that dose. Do not take double or extra doses. Call your care team with questions. What may interact with this medication? Do not take this medication with any of the following: Abatacept Anakinra Biologic medications, such as certolizumab, etanercept, golimumab, infliximab Live virus vaccines This medication may also interact with the following: Cyclosporine Theophylline Vaccines Warfarin This list may not describe all possible interactions. Give your health care provider a list of all the medicines, herbs, non-prescription drugs, or dietary supplements you use. Also tell them if you smoke, drink alcohol, or use illegal drugs. Some items may interact with your medicine. What should I watch for while using this medication? Visit your care team for regular checks on your progress. Tell your care team if your symptoms do not start to get better or if they get worse. You will be tested for tuberculosis (TB) before you start this medication. If your care team prescribes any medication for TB, you should start taking the TB medication before starting this medication. Make sure to finish the full course of TB medication. This medication may increase your risk of getting an infection. Call your care team for advice if you get a fever, chills, sore throat, or other symptoms of a cold or flu. Do not treat yourself. Try to avoid being around people who are sick. Talk to your care team about your risk of cancer. You may be more at risk for certain types of cancer if you take this medication. What side effects may I notice from receiving this medication? Side effects that you should report to your care team as soon as possible: Allergic reactions--skin rash, itching, hives, swelling of the face, lips, tongue, or throat Aplastic anemia--unusual weakness or fatigue, dizziness, headache, trouble breathing, increased bleeding or  bruising Body pain, tingling, or numbness Heart failure--shortness of breath, swelling of the ankles, feet, or hands, sudden weight gain, unusual weakness or fatigue Infection--fever, chills, cough, sore throat, wounds that don't heal, pain or trouble when passing urine, general feeling of discomfort or being unwell Lupus-like syndrome--joint pain, swelling, or stiffness, butterfly-shaped rash on the face, rashes that get worse in the sun, fever, unusual weakness or fatigue Unusual bruising or bleeding Side effects that usually do not require medical attention (report to your care team if they continue or are bothersome): Headache Nausea Pain, redness, or irritation at injection site Runny or stuffy nose Sore throat Stomach pain This list  may not describe all possible side effects. Call your doctor for medical advice about side effects. You may report side effects to FDA at 1-800-FDA-1088. Where should I keep my medication? Keep out of the reach of children and pets. See product label for storage information. Each product may have different instructions. Get rid of any unused medication as instructed or after the expiration date, whichever is first. To get rid of medications that are no longer needed or have expired: Take the medication to a take-back program. Check with your pharmacy or law enforcement to find a location. If you cannot return the medication, ask your pharmacist or care team how to get rid of this medication safely. NOTE: This sheet is a summary. It may not cover all possible information. If you have questions about this medicine, talk to your doctor, pharmacist, or health care provider.  2025 Elsevier/Gold Standard (2023-06-28 00:00:00)

## 2024-04-22 NOTE — Progress Notes (Signed)
 Pharmacy Note  Subjective: Patient presents today to [facility] for follow up office visit.  Patient seen by the pharmacist for counseling on Tremfya for psoriatic arthritis and plaque psoriasis.  Prior therapy includes: Otezla  (discontinued due to insurance and then restarted after which she experienced persistent headaches), methotrexate  (fatigue)   Objective:  CBC    Component Value Date/Time   WBC 15.3 (H) 04/21/2024 0750   RBC 5.16 (H) 04/21/2024 0750   HGB 15.9 (H) 04/21/2024 0750   HGB 13.3 11/13/2019 1152   HCT 46.3 (H) 04/21/2024 0750   HCT 40.5 11/13/2019 1152   PLT 368 04/21/2024 0750   PLT 398 11/13/2019 1152   MCV 89.7 04/21/2024 0750   MCV 87.0 04/02/2020 1510   MCV 78 (L) 11/13/2019 1152   MCH 30.8 04/21/2024 0750   MCHC 34.3 04/21/2024 0750   RDW 14.2 04/21/2024 0750   RDW 16.1 (H) 11/13/2019 1152   LYMPHSABS 3.2 03/31/2024 0843   LYMPHSABS 3.3 (H) 11/13/2019 1152   MONOABS 0.8 03/31/2024 0843   EOSABS 0.2 03/31/2024 0843   EOSABS 0.5 (H) 11/13/2019 1152   BASOSABS 0.1 03/31/2024 0843   BASOSABS 0.2 11/13/2019 1152     CMP     Component Value Date/Time   NA 139 04/21/2024 0750   NA 137 07/28/2020 1557   K 3.8 04/21/2024 0750   CL 101 04/21/2024 0750   CO2 21 (L) 04/21/2024 0750   GLUCOSE 149 (H) 04/21/2024 0750   BUN 10 04/21/2024 0750   BUN 10 07/28/2020 1557   CREATININE 0.85 04/21/2024 0750   CREATININE 0.73 10/10/2023 0852   CALCIUM  10.3 04/21/2024 0750   PROT 7.4 03/31/2024 0843   PROT 7.2 07/28/2020 1557   ALBUMIN 4.6 03/31/2024 0843   ALBUMIN 4.6 07/28/2020 1557   AST 24 03/31/2024 0843   ALT 39 03/31/2024 0843   ALKPHOS 94 03/31/2024 0843   BILITOT 0.6 03/31/2024 0843   BILITOT 0.7 07/28/2020 1557   GFRNONAA >60 04/21/2024 0750   GFRNONAA 107 01/26/2020 1034   GFRAA 122 07/28/2020 1557   GFRAA 123 01/26/2020 1034     Baseline Immunosuppressant Therapy Labs TB GOLD   Hepatitis Panel    Latest Ref Rng & Units  08/23/2022   10:10 AM  Hepatitis  Hep C Ab NON-REACTIVE NON-REACTIVE    HIV Lab Results  Component Value Date   HIV Non Reactive 10/25/2021   HIV NON REACTIVE 12/20/2012   Immunoglobulins    Latest Ref Rng & Units 01/26/2020   10:34 AM  Immunoglobulin Electrophoresis  IgA  47 - 310 mg/dL 823   IgG 399 - 8,359 mg/dL 8,831   IgM 50 - 699 mg/dL 85    SPEP    Latest Ref Rng & Units 03/31/2024    8:43 AM  Serum Protein Electrophoresis  Total Protein 6.5 - 8.1 g/dL 7.4    H3EI No results found for: G6PDH TPMT No results found for: TPMT   Chest x-ray: 04/21/24 - No active cardiopulmonary disease  Assessment/Plan:  Counseled patient that Humira is a TNF blocking agent.  Counseled patient on purpose, proper use, and adverse effects of Humira.  Reviewed the most common adverse effects including infections, headache, and injection site reactions. Discussed that there is the possibility of an increased risk of malignancy including non-melanoma skin cancer but it is not well understood if this increased risk is due to the medication or the disease state.  Advised patient to get yearly dermatology exams due  to risk of skin cancer. Counseled patient that Humira should be held prior to scheduled surgery.  Counseled patient to avoid live vaccines while on Humira.  Recommend annual influenza, PCV 15 or PCV20 or Pneumovax 23, and Shingrix as indicated.  Reviewed the importance of regular labs while on Humira therapy.  Will monitor CBC and CMP 1 month after starting and then every 3 months routinely thereafter. Will monitor TB gold annually. Standing orders placed.    Provided patient with medication education material and answered all questions.  Patient consented to Humira.  Will upload consent into the media tab.  Reviewed storage instructions of Humira.  Advised initial injection must be administered in office.  Patient verbalized understanding.   Dermatology referral was placed  today.  Dose will be for psoriatic arthritis Humira 40 mg every 14 days.  Prescription pending lab results and/or insurance approval.  Counseled patient that Skyrizi is a IL-23 inhibitor.  Counseled patient on purpose, proper use, and adverse effects of Skyrizi.  Reviewed the most common adverse effects including infection, URTIs, headache, fatigue, tinea infections, and injection site. Counseled patient that Skyrizi should be held for infection and prior to scheduled surgery.  Recommend annual influenza, PCV 15 or PCV20 or Pneumovax 23, and Shingrix as indicated. Advised that live vaccines should be avoided while taking Skyrizi.  Reviewed the importance of regular labs while on Skyrizi therapy.  Will monitor CBC and CMP 1 month after starting and then every 3 months routinely thereafter. Will monitor TB gold annually. Standing orders placed.  Provided patient with medication education material and answered all questions.  Patient voiced understanding.  Patient consented to [place].  Will upload consent into the media tab.  Reviewed storage instructions of Skyrizi.  Advised initial injection must be administered in office.  Patient verbalized understanding.  Will apply for Skyrizi through [employer] and update when we receive a response.  Dose will be Skyrizi 150 mg subcuteanously at week 0, week 4 then every 12 weeks thereafter.    Counseled patient that Tremfya is a IL-23 inhibitor.  Counseled patient on purpose, proper use, and adverse effects of Tremfya.  Reviewed the most common adverse effects including infection, URTIs, injection site reactions, nausea/diarrhea, and recurrence of tinea and HSV infections Counseled patient that Tremfya should be held for infection and prior to scheduled surgery.  Recommend annual influenza, PCV 15 or PCV20 or Pneumovax 23, and Shingrix as indicated.  Reviewed the importance of regular labs while on Tremfya therapy.  Will monitor CBC and CMP 1 month after  starting and then every 3 months routinely thereafter. Will monitor TB gold annually. Standing orders placed.  Provided patient with medication education material and answered all questions.  Patient voiced understanding.  Patient consented to [facility].  Will upload consent into the media tab.  Reviewed storage instructions of Tremfya.  Advised initial injection must be administered in office.  Patient verbalized understanding.  Will apply for Tremfya through patient's insurance and update when we receive a response.  Dose will be Tremfya 100 mg on weeks 0 and 4 then every 8 weeks thereafter.  Prescription pending lab results and insurance approval.   Will need what may be a possible yeast infection resolved prior to starting treatment.  Consent obtained for Humira and Tremfya. Patient aware that Skyrizi may be preferred IL23i. Preference for treatment is Tremfya -> Skyrizi -> Humira (TNFi has higher risk for infection and neutropenia)  Sherry Pennant, PharmD, MPH, BCPS, CPP Clinical Pharmacist [facility])

## 2024-04-22 NOTE — Telephone Encounter (Signed)
 Tried calling pt. She is scheduled to see Dr Genelle tomorrow . If she is wanting to discuss knee replacements,it would be better for her to go back to Dr Jerri to discuss

## 2024-04-22 NOTE — Assessment & Plan Note (Signed)
-

## 2024-04-23 ENCOUNTER — Ambulatory Visit: Payer: Self-pay | Admitting: Rheumatology

## 2024-04-23 ENCOUNTER — Ambulatory Visit (HOSPITAL_BASED_OUTPATIENT_CLINIC_OR_DEPARTMENT_OTHER): Admitting: Orthopaedic Surgery

## 2024-04-23 ENCOUNTER — Ambulatory Visit: Payer: Self-pay | Admitting: Family Medicine

## 2024-04-23 ENCOUNTER — Encounter: Payer: Self-pay | Admitting: *Deleted

## 2024-04-23 DIAGNOSIS — D72829 Elevated white blood cell count, unspecified: Secondary | ICD-10-CM

## 2024-04-23 LAB — TSH: TSH: 3.29 m[IU]/L

## 2024-04-23 LAB — HEMOGLOBIN A1C
Hgb A1c MFr Bld: 5.4 % (ref <5.7)
Mean Plasma Glucose: 108 mg/dL
eAG (mmol/L): 6 mmol/L

## 2024-04-23 NOTE — Progress Notes (Signed)
 I called patient discussed elevated WBC count.  Patient does not recall having any recent steroid injection or oral steroids.  She states she had shingles 1 month ago.  On reviewing records she has had elevated WBC count for many years.  Please refer her to hematology for evaluation. Her liver functions were also elevated.  Patient states she had recent Toradol  injection for pain.  Hepatitis B surface antibody is positive which most likely is due to previous hepatitis B vaccination.  Sed rate and rheumatoid factor were within normal limits.  We will call her back once more results are available.

## 2024-04-24 LAB — CBC WITH DIFFERENTIAL/PLATELET
Absolute Lymphocytes: 4452 {cells}/uL — ABNORMAL HIGH (ref 850–3900)
Absolute Monocytes: 1022 {cells}/uL — ABNORMAL HIGH (ref 200–950)
Basophils Absolute: 140 {cells}/uL (ref 0–200)
Basophils Relative: 1 %
Eosinophils Absolute: 336 {cells}/uL (ref 15–500)
Eosinophils Relative: 2.4 %
HCT: 46.5 % — ABNORMAL HIGH (ref 35.0–45.0)
Hemoglobin: 16 g/dL — ABNORMAL HIGH (ref 11.7–15.5)
MCH: 31.5 pg (ref 27.0–33.0)
MCHC: 34.4 g/dL (ref 32.0–36.0)
MCV: 91.5 fL (ref 80.0–100.0)
MPV: 9.3 fL (ref 7.5–12.5)
Monocytes Relative: 7.3 %
Neutro Abs: 8050 {cells}/uL — ABNORMAL HIGH (ref 1500–7800)
Neutrophils Relative %: 57.5 %
Platelets: 362 Thousand/uL (ref 140–400)
RBC: 5.08 Million/uL (ref 3.80–5.10)
RDW: 13.7 % (ref 11.0–15.0)
Total Lymphocyte: 31.8 %
WBC: 14 Thousand/uL — ABNORMAL HIGH (ref 3.8–10.8)

## 2024-04-24 LAB — COMPREHENSIVE METABOLIC PANEL WITH GFR
AG Ratio: 1.9 (calc) (ref 1.0–2.5)
ALT: 47 U/L — ABNORMAL HIGH (ref 6–29)
AST: 27 U/L (ref 10–30)
Albumin: 5 g/dL (ref 3.6–5.1)
Alkaline phosphatase (APISO): 97 U/L (ref 31–125)
BUN: 16 mg/dL (ref 7–25)
CO2: 25 mmol/L (ref 20–32)
Calcium: 9.8 mg/dL (ref 8.6–10.2)
Chloride: 102 mmol/L (ref 98–110)
Creat: 0.91 mg/dL (ref 0.50–0.99)
Globulin: 2.7 g/dL (ref 1.9–3.7)
Glucose, Bld: 80 mg/dL (ref 65–99)
Potassium: 4.3 mmol/L (ref 3.5–5.3)
Sodium: 138 mmol/L (ref 135–146)
Total Bilirubin: 0.7 mg/dL (ref 0.2–1.2)
Total Protein: 7.7 g/dL (ref 6.1–8.1)
eGFR: 80 mL/min/1.73m2 (ref >=60)

## 2024-04-24 LAB — QUANTIFERON-TB GOLD PLUS
Mitogen-NIL: 6.79 [IU]/mL
NIL: 0.01 [IU]/mL
QuantiFERON-TB Gold Plus: NEGATIVE
TB1-NIL: 0 [IU]/mL
TB2-NIL: 0 [IU]/mL

## 2024-04-24 LAB — HEPATITIS B SURFACE ANTIBODY,QUALITATIVE: Hep B S Ab: REACTIVE — AB

## 2024-04-24 LAB — HEPATITIS B SURFACE ANTIGEN: Hepatitis B Surface Ag: NONREACTIVE

## 2024-04-24 LAB — SEDIMENTATION RATE: Sed Rate: 2 mm/h (ref 0–20)

## 2024-04-24 LAB — CYCLIC CITRUL PEPTIDE ANTIBODY, IGG: Cyclic Citrullin Peptide Ab: <16 U

## 2024-04-24 LAB — HEPATITIS B CORE ANTIBODY, IGM: Hep B C IgM: NONREACTIVE

## 2024-04-24 LAB — RHEUMATOID FACTOR: Rheumatoid fact SerPl-aCnc: <10 [IU]/mL (ref <14)

## 2024-04-25 NOTE — Progress Notes (Signed)
 Her liver function is mildly elevated at 47.  Hepatitis B surface antibody positive indicating immunization.  Sed rate normal, rheumatoid factor negative, anti-CCP negative, TB Gold negative, hepatitis C nonreactive.  Please forward results to her PCP.  Patient should avoid all NSAIDs.  It is okay to start on Tremfya once approved.

## 2024-04-25 NOTE — Telephone Encounter (Signed)
Results forwarded

## 2024-04-28 ENCOUNTER — Ambulatory Visit: Payer: Self-pay

## 2024-04-28 ENCOUNTER — Other Ambulatory Visit (HOSPITAL_BASED_OUTPATIENT_CLINIC_OR_DEPARTMENT_OTHER): Payer: Self-pay

## 2024-04-28 ENCOUNTER — Ambulatory Visit: Admitting: Family Medicine

## 2024-04-28 ENCOUNTER — Encounter: Payer: Self-pay | Admitting: Family Medicine

## 2024-04-28 VITALS — BP 142/82 | HR 85 | Temp 97.0°F | Ht 66.0 in | Wt 221.8 lb

## 2024-04-28 DIAGNOSIS — L409 Psoriasis, unspecified: Secondary | ICD-10-CM | POA: Diagnosis not present

## 2024-04-28 DIAGNOSIS — D72829 Elevated white blood cell count, unspecified: Secondary | ICD-10-CM | POA: Diagnosis not present

## 2024-04-28 DIAGNOSIS — R1012 Left upper quadrant pain: Secondary | ICD-10-CM

## 2024-04-28 DIAGNOSIS — B0229 Other postherpetic nervous system involvement: Secondary | ICD-10-CM | POA: Diagnosis not present

## 2024-04-28 MED ORDER — ZORYVE 0.3 % EX CREA: 1.0000 | TOPICAL_CREAM | Freq: Every day | CUTANEOUS | 1 refills | Status: DC

## 2024-04-28 MED ORDER — ZORYVE 0.3 % EX CREA
1.0000 | TOPICAL_CREAM | Freq: Every day | CUTANEOUS | 1 refills | Status: DC
  Filled 2024-04-28: qty 60, 30d supply, fill #0

## 2024-04-28 MED ORDER — OXYCODONE HCL 5 MG PO TABS
5.0000 mg | ORAL_TABLET | Freq: Four times a day (QID) | ORAL | 0 refills | Status: DC | PRN
  Filled 2024-04-28: qty 15, 4d supply, fill #0

## 2024-04-28 NOTE — Assessment & Plan Note (Signed)
 Abdominal pain, left upper quadrant with bowel habit changes Intermittent left upper quadrant pain with alternating bowel habits. No blood in stool. Pain disproportionate to physical findings. History of gallbladder surgery. - Declines abdominal imaging to investigate causes. - Ensure follow-up with gastroenterology.

## 2024-04-28 NOTE — Assessment & Plan Note (Signed)
 Referral to hematology

## 2024-04-28 NOTE — Assessment & Plan Note (Signed)
 Assessment & Plan Postherpetic neuralgia Chronic severe pain post-shingles, left side under armpit and rib area. Gabapentin and Lyrica  not tolerated. Oxycodone  provides temporary relief. NSAIDs and acetaminophen  contraindicated due to liver enzymes. - Prescribed 15 oxycodone  tablets for pain management. - Consider duloxetine or amitriptyline if oxycodone  insufficient. - Coordinate with pain management to adjust medications, remove acetaminophen  if necessary.

## 2024-04-28 NOTE — Telephone Encounter (Signed)
 FYI Only or Action Required?: FYI only for provider.  Patient was last seen in primary care on 04/22/2024 by Kayla Jeoffrey RAMAN, FNP.  Called Nurse Triage reporting Pain and Results.  Symptoms began about a month ago.  Interventions attempted: Nothing. Pt reports she was told not to take nsaids or tylenol  by her rheumatologists  Symptoms are: gradually worsening.  Triage Disposition: See Physician Within 24 Hours  Patient/caregiver understands and will follow disposition?: Yes  Copied from CRM #[id number]. Topic: Clinical - Red Word Triage >> Apr 28, 2024  9:55 AM Viola F wrote: Patient having pain in left side, requested appt to discuss blood work from rheumatology 04/22/24  Reason for Disposition  MODERATE pain (e.g., interferes with normal activities or awakens from sleep)  Answer Assessment - Initial Assessment Questions Santina to ER last Monday for pain in left side and CP r/t shingles. Reporting persistent pain. Also asking about results from labs ordered by rheumatology. Advised pt to call the her rheumatology office to discuss results.  1. LOCATION: Where does it hurt? (e.g., left, right)     Left side  2. ONSET: When did the pain start?     When shingles started 1 month ago  3. SEVERITY: How bad is the pain? (e.g., Scale 1-10; mild, moderate, or severe)     6/10 constant, flares of severe pain  4. PATTERN: Does the pain come and go, or is it constant?      Constant with waves of severe pain  5. CAUSE: What do you think is causing the pain?     Shingles  6. OTHER SYMPTOMS:  Do you have any other symptoms? (e.g., fever, abdomen pain, vomiting, leg weakness, burning with urination, blood in urine)     No  Protocols used: [facility] Pain-A-AH

## 2024-04-28 NOTE — Progress Notes (Signed)
 Subjective:  HPI: Theresa Sawyer is a 43 y.o. female presenting on 04/28/2024 for Hospitalization Follow-up (04/21/2024 severe left side pain from shingles has worsened since leaving the hospital )   HPI Patient is in today for ongoing postherpetic neuralgia. Discussed the use of AI scribe software for clinical note transcription with the patient, who gave verbal consent to proceed.  History of Present Illness Theresa Sawyer is a 43 year old female with psoriasis and post-herpetic neuralgia who presents with worsening pain and skin issues.  She has been experiencing worsening pain and skin issues, with a history of psoriasis and psoriatic arthritis affecting multiple areas. She notes purple spots and papules on her skin and has been using an antibiotic cream for a rash under her breasts, which has shown slight improvement.  She describes persistent pain on her left side, particularly under her left rib and armpit, associated with post-herpetic neuralgia following a shingles infection. The pain is described as a squeezing sensation, sometimes requiring pressure for relief. She reports having undergone x-rays of her hands, knees, feet, and toes, and was told she has osteoarthritis. She has a history of osteoarthritis in her knees and hands and is managed by chronic pain.  She experiences gastrointestinal symptoms, including alternating severe diarrhea and constipation, without blood in her stool. She does not have daily bowel movements and has a history of emergency gallbladder surgery. She reports a decreased appetite due to pain after eating.  For pain management, she has been prescribed oxycodone , which provides some relief. She cannot take gabapentin or Lyrica  due to adverse effects. Gabapentin previously caused nausea. She has been advised against NSAIDs or Tylenol  due to blood work results.  She is being followed by a rheumatologist who has referred her to a gastroenterologist,  dermatologist, and oncologist for further evaluation. She has not yet been contacted for an appointment. She has a history of a 1.9 cm thyroid  nodule identified on a CT scan.    Review of Systems  All other systems reviewed and are negative.   Relevant past medical history reviewed and updated as indicated.   Past Medical History:  Diagnosis Date   Abnormal uterine bleeding (AUB)    Anxiety    Arthritis    oa   Depression    GERD (gastroesophageal reflux disease)    History of chest pain (10-20-2019  pt denies any cardiac s&s)   mutliple ED visits, atypical chest pain, chest wall pain, muscularskeletal chest pain;  pt had cardiology evaulation dated 04-23-2018 note in epic , state atypical chest wall pain with negative d dimer and troponin's multiple times in epic, suggested CTA  (pt did not get done)   Hyperlipidemia    Hypertension    IDA (iron deficiency anemia)    none recently   Menorrhagia    Migraine    OSA on CPAP    followed by dr ather cpap set on 7 to 14   Osteoporosis    Psoriasis    Sleep apnea    Urinary frequency      Past Surgical History:  Procedure Laterality Date   ABDOMINAL HYSTERECTOMY     CHOLECYSTECTOMY N/A 10/25/2021   Procedure: LAPAROSCOPIC CHOLECYSTECTOMY with INTRAOPERATIVE CHOLANGIOGRAM;  Surgeon: Sheldon Standing, MD;  Location: [facility];  Service: General;  Laterality: N/A;   DILATATION & CURETTAGE/HYSTEROSCOPY WITH MYOSURE N/A 10/24/2019   Procedure: DILATATION & CURETTAGE/HYSTEROSCOPY WITH MYOSURE;  Surgeon: Arnaldo Purchase, MD;  Location: [facility];  Service: Gynecology;  Laterality: N/A;   HYSTEROSCOPY WITH NOVASURE N/A 04/06/2020   Procedure: DILATION AND CURRETTAGE; NOVASURE ABLATION;  Surgeon: Arnaldo Purchase, MD;  Location: [facility] [place];  Service: Gynecology;  Laterality: N/A;   LAPAROSCOPY N/A 04/06/2020   Procedure: LAPAROSCOPY OPERATIVE; LYSIS OF ADHESION;  Surgeon: Arnaldo Purchase, MD;  Location:  [facility];  Service: Gynecology;  Laterality: N/A;   ROBOTIC ASSISTED TOTAL HYSTERECTOMY WITH BILATERAL SALPINGO OOPHERECTOMY Bilateral 09/28/2020   Procedure: XI ROBOTIC ASSISTED TOTAL LAPAROSCOPIC HYSTERECTOMY WITH BILATERAL SALPINGO OOPHORECTOMY;  Surgeon: Lavoie, Marie-Lyne, MD;  Location: [facility] [place];  Service: Gynecology;  Laterality: Bilateral;  request 8:30am OR start time in IQUEUE held time for Dr. Lavoie requests 2 hours KATHY CONFIRMED ON 2/10 W/NICK FOR  TRACY (RNFA) TO ASSIST   TUBAL LIGATION Bilateral 02/05/2013   Procedure: POST PARTUM TUBAL LIGATION;  Surgeon: Lynwood KANDICE Solomons, MD;  Location: [facility];  Service: Gynecology;  Laterality: Bilateral;    Filshie clips    Allergies and medications reviewed and updated.   Current Outpatient Medications:    clindamycin (CLINDAGEL) 1 % gel, Apply topically 2 (two) times daily., Disp: 30 g, Rfl: 0   escitalopram  (LEXAPRO ) 20 MG tablet, Take 1 tablet (20 mg total) by mouth daily., Disp: 90 tablet, Rfl: 1   famotidine  (PEPCID ) 20 MG tablet, Take 1 tablet (20 mg total) by mouth 2 (two) times daily., Disp: 30 tablet, Rfl: 0   lidocaine  (LIDODERM ) 5 %, Place 1 patch onto the skin daily. Remove & Discard patch within 12 hours or as directed by MD, Disp: 30 patch, Rfl: 0   metFORMIN  (GLUCOPHAGE -XR) 500 MG 24 hr tablet, Take 1 tablet (500 mg total) by mouth 2 (two) times daily with a meal., Disp: 180 tablet, Rfl: 3   oxyCODONE  (ROXICODONE ) 5 MG immediate release tablet, Take 1 tablet (5 mg total) by mouth every 6 (six) hours as needed for severe pain (pain score 7-10)., Disp: 15 tablet, Rfl: 0   pantoprazole  (PROTONIX ) 20 MG tablet, Take 1 tablet (20 mg total) by mouth daily., Disp: 30 tablet, Rfl: 0   Roflumilast (ZORYVE) 0.3 % CREA, Apply 1 Application topically daily., Disp: 60 g, Rfl: 1   valsartan  (DIOVAN ) 80 MG tablet, TAKE 1 AND 1/2 TABLETS BY MOUTH DAILY, Disp: 60 tablet, Rfl: 0  Allergies  Allergen  Reactions   Ibuprofen      Elevated LFTs and upset stomach    Tylenol  [Acetaminophen ]     Elevated LFTs and upset stomach     Objective:   BP (!) 142/82   Pulse 85   Temp (!) 97 F (36.1 C)   Ht 5' 6 (1.676 m)   Wt 221 lb 12.8 oz (100.6 kg)   LMP 09/01/2020   SpO2 98%   BMI 35.80 kg/m      04/28/2024    3:39 PM 04/22/2024    3:41 PM 04/22/2024    8:24 AM  Vitals with BMI  Height 5' 6 5' 6   Weight 221 lbs 13 oz 224 lbs 10 oz   BMI 35.82 36.27   Systolic 142 130 883  Diastolic 82 85 77  Pulse 85 75 69     Physical Exam Vitals and nursing note reviewed.  Constitutional:      Appearance: Normal appearance. She is normal weight.  HENT:     Head: Normocephalic and atraumatic.  Cardiovascular:     Rate and Rhythm: Normal rate and regular rhythm.     Pulses: Normal  pulses.     Heart sounds: Normal heart sounds.  Pulmonary:     Effort: Pulmonary effort is normal.     Breath sounds: Normal breath sounds.  Abdominal:     Tenderness: There is abdominal tenderness in the left upper quadrant.  Skin:    General: Skin is warm and dry.     Findings: Rash present.      Neurological:     General: No focal deficit present.     Mental Status: She is alert and oriented to person, place, and time. Mental status is at baseline.  Psychiatric:        Mood and Affect: Mood normal.        Behavior: Behavior normal.        Thought Content: Thought content normal.        Judgment: Judgment normal.     Assessment & Plan:  Postherpetic neuralgia Assessment & Plan: Assessment & Plan Postherpetic neuralgia Chronic severe pain post-shingles, left side under armpit and rib area. Gabapentin and Lyrica  not tolerated. Oxycodone  provides temporary relief. NSAIDs and acetaminophen  contraindicated due to liver enzymes. - Prescribed 15 oxycodone  tablets for pain management. - Consider duloxetine or amitriptyline if oxycodone  insufficient. - Coordinate with pain management to adjust  medications, remove acetaminophen  if necessary.     Psoriasis Assessment & Plan: Psoriasis Psoriasis in multiple areas, awaiting Tremfya approval. - Prescribe Zoryve cream for management. - Await Tremfya approval.  Orders: -     Ambulatory referral to Dermatology  Leukocytosis, unspecified type Assessment & Plan: Referral to hematology  Orders: -     Ambulatory referral to Hematology / Oncology  Left upper quadrant abdominal pain Assessment & Plan: Abdominal pain, left upper quadrant with bowel habit changes Intermittent left upper quadrant pain with alternating bowel habits. No blood in stool. Pain disproportionate to physical findings. History of gallbladder surgery. - Declines abdominal imaging to investigate causes. - Ensure follow-up with gastroenterology.     Orders: -     Ambulatory referral to Gastroenterology  Other orders -     oxyCODONE  HCl; Take 1 tablet (5 mg total) by mouth every 6 (six) hours as needed for severe pain (pain score 7-10).  Dispense: 15 tablet; Refill: 0 -     Zoryve; Apply 1 Application topically daily.  Dispense: 60 g; Refill: 1     Follow up plan: Return if symptoms worsen or fail to improve.  Jeoffrey GORMAN Barrio, FNP

## 2024-04-28 NOTE — Assessment & Plan Note (Signed)
 Psoriasis Psoriasis in multiple areas, awaiting Tremfya approval. - Prescribe Zoryve cream for management. - Await Tremfya approval.

## 2024-04-29 ENCOUNTER — Encounter: Payer: Self-pay | Admitting: Gastroenterology

## 2024-04-30 ENCOUNTER — Encounter: Payer: Self-pay | Admitting: *Deleted

## 2024-05-01 ENCOUNTER — Ambulatory Visit: Payer: Self-pay

## 2024-05-01 NOTE — Telephone Encounter (Signed)
 FYI Only or Action Required?: FYI only for provider.  Patient was last seen in primary care on 04/28/2024 by Kayla Jeoffrey RAMAN, FNP.  Called Nurse Triage reporting Panic Attack.  Symptoms began today.  Interventions attempted: Other: deep breathing.  Symptoms are: unchanged.  Triage Disposition: See Physician Within 24 Hours  Patient/caregiver understands and will follow disposition?: Unsure           Copied from CRM #[id number]. Topic: Clinical - Red Word Triage >> May 01, 2024  7:54 AM Olam RAMAN wrote: Red Word that prompted transfer to Nurse Triage: Having anxiety and thinks she is having a panick attack Reason for Disposition  Patient sounds very upset or troubled to the triager    Triager attempted to schedule with PCP, but no access. Advised to seek guidance from [employer]  Answer Assessment - Initial Assessment Questions 1. CONCERN: Did anything happen that prompted you to call today?      I feel like I'm having a panic attack 2. ANXIETY SYMPTOMS: Can you describe how you (your loved one; patient) have been feeling? (e.g., tense, restless, panicky, anxious, keyed up, overwhelmed, sense of impending doom).      anxiety 3. ONSET: How long have you been feeling this way? (e.g., hours, days, weeks)     This AM - woke up with anxiety 4. SEVERITY: How would you rate the level of anxiety? (e.g., 0 - 10; or mild, moderate, severe).     7/10 5. FUNCTIONAL IMPAIRMENT: How have these feelings affected your ability to do daily activities? Have you had more difficulty than usual doing your normal daily activities? (e.g., getting better, same, worse; self-care, school, work, interactions)     Woke up with panic attack. 6. HISTORY: Have you felt this way before? Have you ever been diagnosed with an anxiety problem in the past? (e.g., generalized anxiety disorder, panic attacks, PTSD). If Yes, ask: How was this problem treated? (e.g., medicines, counseling, etc.)     First  time 7. RISK OF HARM - SUICIDAL IDEATION: Do you ever have thoughts of hurting or killing yourself? If Yes, ask:  Do you have these feelings now? Do you have a plan on how you would do this?     denies 8. TREATMENT:  What has been done so far to treat this anxiety? (e.g., medicines, relaxation strategies). What has helped?     Breathing technique 9. THERAPIST: Do you have a counselor or therapist? If Yes, ask: What is their name?     denies 10. POTENTIAL TRIGGERS: Do you drink caffeinated beverages (e.g., coffee, colas, teas), and how much daily? Do you drink alcohol or use any drugs? Have you started any new medicines recently?       *No Answer* 11. PATIENT SUPPORT: Who is with you now? Who do you live with? Do you have family or friends who you can talk to?        Lives with SO, son and mother 91. OTHER SYMPTOMS: Do you have any other symptoms? (e.g., feeling depressed, trouble concentrating, trouble sleeping, trouble breathing, palpitations or fast heartbeat, chest pain, sweating, nausea, or diarrhea)       Nausea, CP - endorses heart beating fast 13. PREGNANCY: Is there any chance you are pregnant? When was your last menstrual period?       N/a  Protocols used: Anxiety and Panic Attack-A-AH

## 2024-05-02 NOTE — Telephone Encounter (Addendum)
 Community education officer for update on [place]. Per rep, authorization is not required but patient would be responsible for 100% of cost. Patient's plan only covers generic drugs. Does not cover specialty medications. She will need to apply for patient assistance as uninsured or underinsured  Phone: [phone]  I spoke with patient - she is wiling to wait for Tremfya's PAP determination since she is waiting for her current dermatologic infection to resolve. She would like application sent via Docus[employer] to email in chart (confirmed). Ca mark her as uninsured on application since her rx insurance does not populate in eligibility search anyways  Sherry Pennant, PharmD, MPH, BCPS, CPP Clinical Pharmacist [facility])

## 2024-05-05 ENCOUNTER — Other Ambulatory Visit: Payer: Self-pay | Admitting: Family Medicine

## 2024-05-05 ENCOUNTER — Ambulatory Visit (INDEPENDENT_AMBULATORY_CARE_PROVIDER_SITE_OTHER): Admitting: Family Medicine

## 2024-05-05 ENCOUNTER — Ambulatory Visit: Payer: Self-pay

## 2024-05-05 ENCOUNTER — Encounter: Payer: Self-pay | Admitting: Family Medicine

## 2024-05-05 VITALS — BP 150/98 | HR 80 | Temp 98.2°F | Ht 66.0 in | Wt 219.4 lb

## 2024-05-05 DIAGNOSIS — L409 Psoriasis, unspecified: Secondary | ICD-10-CM

## 2024-05-05 DIAGNOSIS — B0229 Other postherpetic nervous system involvement: Secondary | ICD-10-CM | POA: Diagnosis not present

## 2024-05-05 DIAGNOSIS — D72829 Elevated white blood cell count, unspecified: Secondary | ICD-10-CM | POA: Diagnosis not present

## 2024-05-05 MED ORDER — AMITRIPTYLINE HCL 10 MG PO TABS: 10.0000 mg | ORAL_TABLET | Freq: Every day | ORAL | 1 refills | Status: DC

## 2024-05-05 NOTE — Assessment & Plan Note (Signed)
 Assessment and Plan Assessment & Plan Follow-up - Attend gastroenterology appointment on December 9th. - Attend oncology appointment on January 5th. - Attend pain management appointment on November 5th.

## 2024-05-05 NOTE — Assessment & Plan Note (Signed)
 Assessment and Plan Assessment & Plan  Psoriasis Awaiting Tremfya initiation. Delayed due to infection and insurance issues. Completing insurance approval packet. Exploring BlinkRx for cost reduction. - Assist with insurance paperwork for Tremfya approval. - Explore BlinkRx for cost reduction.

## 2024-05-05 NOTE — Telephone Encounter (Signed)
 Pt called back. Rn spoke with [facility] who stated they could get her in Today and took the call to schedule the patient.

## 2024-05-05 NOTE — Telephone Encounter (Signed)
 PAP form sent to pt via Docusign.

## 2024-05-05 NOTE — Progress Notes (Signed)
 Subjective:  HPI: Theresa Sawyer is a 43 y.o. female presenting on 05/05/2024 for Medical Management of Chronic Issues (Still having shingles pain x 2 weeks /Discuss other health issues)   HPI  Discussed the use of AI scribe software for clinical note transcription with the patient, who gave verbal consent to proceed.  History of Present Illness Theresa Sawyer is a 43 year old female who presents with severe pain management issues due to shingles.  She experiences severe, unbearable pain due to shingles, describing it as constant, burning, dull, and sometimes bulging. She reports that the pain causes her fear and anxiety, as she has never felt this way before.  She has previously tried gabapentin and pregabalin  for pain management, but both caused nausea and vomiting, leading her to discontinue their use. She is currently on Lexapro  and has been prescribed a cream for her psoriasis, but the cost is prohibitive at $900. Is working to obtain through [employer] assistance. She is attempting to manage the cost through a pharmacy copay card.  She has been referred to a gastroenterologist for issues related to constipation and diarrhea, with an appointment scheduled for December 9th. Additionally, she has an appointment with an oncologist on January 5th.  Her rheumatologist has been involved in her care, particularly regarding the potential use of Tremfya, which her insurance does not cover. A packet is being prepared to address this issue.  She has an upcoming appointment with her pain management team on November 5th, located in [place], and she is actively seeking alternative pain management options.     Review of Systems  All other systems reviewed and are negative.   Relevant past medical history reviewed and updated as indicated.   Past Medical History:  Diagnosis Date   Abnormal uterine bleeding (AUB)    Anxiety    Arthritis    oa   Depression    GERD (gastroesophageal reflux  disease)    History of chest pain (10-20-2019  pt denies any cardiac s&s)   mutliple ED visits, atypical chest pain, chest wall pain, muscularskeletal chest pain;  pt had cardiology evaulation dated 04-23-2018 note in epic , state atypical chest wall pain with negative d dimer and troponin's multiple times in epic, suggested CTA  (pt did not get done)   Hyperlipidemia    Hypertension    IDA (iron deficiency anemia)    none recently   Menorrhagia    Migraine    OSA on CPAP    followed by dr ather cpap set on 7 to 14   Osteoporosis    Psoriasis    Sleep apnea    Urinary frequency      Past Surgical History:  Procedure Laterality Date   ABDOMINAL HYSTERECTOMY     CHOLECYSTECTOMY N/A 10/25/2021   Procedure: LAPAROSCOPIC CHOLECYSTECTOMY with INTRAOPERATIVE CHOLANGIOGRAM;  Surgeon: Sheldon Standing, MD;  Location: [facility];  Service: General;  Laterality: N/A;   DILATATION & CURETTAGE/HYSTEROSCOPY WITH MYOSURE N/A 10/24/2019   Procedure: DILATATION & CURETTAGE/HYSTEROSCOPY WITH MYOSURE;  Surgeon: Arnaldo Purchase, MD;  Location: [facility] [place];  Service: Gynecology;  Laterality: N/A;   HYSTEROSCOPY WITH NOVASURE N/A 04/06/2020   Procedure: DILATION AND CURRETTAGE; NOVASURE ABLATION;  Surgeon: Arnaldo Purchase, MD;  Location: [facility] [place];  Service: Gynecology;  Laterality: N/A;   LAPAROSCOPY N/A 04/06/2020   Procedure: LAPAROSCOPY OPERATIVE; LYSIS OF ADHESION;  Surgeon: Arnaldo Purchase, MD;  Location: [facility];  Service: Gynecology;  Laterality: N/A;  ROBOTIC ASSISTED TOTAL HYSTERECTOMY WITH BILATERAL SALPINGO OOPHERECTOMY Bilateral 09/28/2020   Procedure: XI ROBOTIC ASSISTED TOTAL LAPAROSCOPIC HYSTERECTOMY WITH BILATERAL SALPINGO OOPHORECTOMY;  Surgeon: Lavoie, Marie-Lyne, MD;  Location: [facility] [place];  Service: Gynecology;  Laterality: Bilateral;  request 8:30am OR start time in IQUEUE held time for Dr. Lavoie requests 2 hours KATHY  CONFIRMED ON 2/10 W/NICK FOR  TRACY (RNFA) TO ASSIST   TUBAL LIGATION Bilateral 02/05/2013   Procedure: POST PARTUM TUBAL LIGATION;  Surgeon: Lynwood KANDICE Solomons, MD;  Location: [facility];  Service: Gynecology;  Laterality: Bilateral;    Filshie clips    Allergies and medications reviewed and updated.   Current Outpatient Medications:    amitriptyline (ELAVIL) 10 MG tablet, Take 1 tablet (10 mg total) by mouth at bedtime., Disp: 90 tablet, Rfl: 1   famotidine  (PEPCID ) 20 MG tablet, Take 1 tablet (20 mg total) by mouth 2 (two) times daily., Disp: 30 tablet, Rfl: 0   lidocaine  (LIDODERM ) 5 %, Place 1 patch onto the skin daily. Remove & Discard patch within 12 hours or as directed by MD, Disp: 30 patch, Rfl: 0   metFORMIN  (GLUCOPHAGE -XR) 500 MG 24 hr tablet, Take 1 tablet (500 mg total) by mouth 2 (two) times daily with a meal., Disp: 180 tablet, Rfl: 3   pantoprazole  (PROTONIX ) 20 MG tablet, Take 1 tablet (20 mg total) by mouth daily., Disp: 30 tablet, Rfl: 0   Roflumilast (ZORYVE) 0.3 % CREA, Apply 1 Application topically daily., Disp: 60 g, Rfl: 1   valsartan  (DIOVAN ) 80 MG tablet, TAKE 1 AND 1/2 TABLETS BY MOUTH DAILY, Disp: 60 tablet, Rfl: 0  Allergies  Allergen Reactions   Ibuprofen      Elevated LFTs and upset stomach    Tylenol  [Acetaminophen ]     Elevated LFTs and upset stomach     Objective:   BP (!) 150/98   Pulse 80   Temp 98.2 F (36.8 C)   Ht 5' 6 (1.676 m)   Wt 219 lb 6.4 oz (99.5 kg)   LMP 09/01/2020   SpO2 98%   BMI 35.41 kg/m      05/05/2024    3:56 PM 04/28/2024    3:39 PM 04/22/2024    3:41 PM  Vitals with BMI  Height 5' 6 5' 6 5' 6  Weight 219 lbs 6 oz 221 lbs 13 oz 224 lbs 10 oz  BMI 35.43 35.82 36.27  Systolic 150 142 869  Diastolic 98 82 85  Pulse 80 85 75     Physical Exam Vitals and nursing note reviewed.  Constitutional:      Appearance: Normal appearance. She is normal weight.  HENT:     Head: Normocephalic and atraumatic.  Skin:     General: Skin is warm and dry.  Neurological:     General: No focal deficit present.     Mental Status: She is alert and oriented to person, place, and time. Mental status is at baseline.  Psychiatric:        Mood and Affect: Mood normal.        Behavior: Behavior normal.        Thought Content: Thought content normal.        Judgment: Judgment normal.     Assessment & Plan:  Postherpetic neuralgia Assessment & Plan: Assessment and Plan Assessment & Plan Postherpetic neuralgia Severe burning pain persists. Current medications ineffective. Gabapentin and pregabalin  caused nausea and vomiting. Amitriptyline considered for nerve pain management. - Prescribe amitriptyline to  be filled at [facility]  - Decrease Lexapro  to 10mg  daily        Psoriasis Assessment & Plan: Assessment and Plan Assessment & Plan  Psoriasis Awaiting Tremfya initiation. Delayed due to infection and insurance issues. Completing insurance approval packet. Exploring BlinkRx for cost reduction. - Assist with insurance paperwork for Tremfya approval. - Explore BlinkRx for cost reduction.     Leukocytosis, unspecified type Assessment & Plan: Assessment and Plan Assessment & Plan Follow-up - Attend gastroenterology appointment on December 9th. - Attend oncology appointment on January 5th. - Attend pain management appointment on November 5th.     Other orders -     Amitriptyline HCl; Take 1 tablet (10 mg total) by mouth at bedtime.  Dispense: 90 tablet; Refill: 1     Follow up plan: Return if symptoms worsen or fail to improve.  Jeoffrey GORMAN Barrio, FNP

## 2024-05-05 NOTE — Telephone Encounter (Signed)
 FYI Only or Action Required?: Action required by provider: request for appointment. Pt would also like a refill on pain medication  Patient was last seen in primary care on 04/28/2024 by Kayla Jeoffrey RAMAN, FNP.  Called Nurse Triage reporting Herpes Zoster and Pain.  Symptoms began several months ago.  Interventions attempted: Other: Was in hospital.  Symptoms are: unchanged.  Triage Disposition: See PCP When Office is Open (Within 3 Days)  Patient/caregiver understands and will follow disposition?: Yes - no appts available for today. Called CAL  - Crm to be sent to Attn Sheena to see if pt can be worked in today.                     Copied from CRM [id number]. Topic: Clinical - Red Word Triage >> May 05, 2024  7:53 AM Willma SAUNDERS wrote: [facility] that prompted transfer to Nurse Triage: Patient is having a lot of pain in her left side. Has an appointment for tomorrow for a follow up but would like to be seen today. Reason for Disposition  [1] MODERATE pain (e.g., interferes with normal activities) AND [2] present > 3 days  Answer Assessment - Initial Assessment Questions 1. ONSET: When did the muscle aches or body pains start?      A few months ago 2. LOCATION: What part of your body is hurting? (e.g., entire body, arms, legs)      Left side 3. SEVERITY: How bad is the pain? (Scale 1-10; or mild, moderate, severe)     6.5/10 4. CAUSE: What do you think is causing the pains?     Unsure 5. FEVER: Do you have a fever? If Yes, ask: What is your temperature, how was it measured, and  when did it start?      no 6. OTHER SYMPTOMS: Do you have any other symptoms? (e.g., chest pain, cold or flu symptoms, rash, weakness, weight loss)     Chest pain at hospital  Protocols used: Muscle Aches and Body Pain-A-AH

## 2024-05-05 NOTE — Assessment & Plan Note (Addendum)
 Assessment and Plan Assessment & Plan Postherpetic neuralgia Severe burning pain persists. Current medications ineffective. Gabapentin and pregabalin  caused nausea and vomiting. Amitriptyline considered for nerve pain management. - Prescribe amitriptyline to be filled at [facility]  - Decrease Lexapro  to 10mg  daily

## 2024-05-06 ENCOUNTER — Ambulatory Visit: Admitting: Family Medicine

## 2024-05-07 DIAGNOSIS — M545 Low back pain, unspecified: Secondary | ICD-10-CM | POA: Insufficient documentation

## 2024-05-07 DIAGNOSIS — G894 Chronic pain syndrome: Secondary | ICD-10-CM | POA: Insufficient documentation

## 2024-05-07 DIAGNOSIS — M722 Plantar fascial fibromatosis: Secondary | ICD-10-CM | POA: Insufficient documentation

## 2024-05-07 DIAGNOSIS — M25561 Pain in right knee: Secondary | ICD-10-CM | POA: Insufficient documentation

## 2024-05-07 DIAGNOSIS — Z79891 Long term (current) use of opiate analgesic: Secondary | ICD-10-CM | POA: Insufficient documentation

## 2024-05-07 DIAGNOSIS — G8929 Other chronic pain: Secondary | ICD-10-CM | POA: Insufficient documentation

## 2024-05-07 DIAGNOSIS — M419 Scoliosis, unspecified: Secondary | ICD-10-CM | POA: Insufficient documentation

## 2024-05-08 ENCOUNTER — Ambulatory Visit: Admitting: Orthopedic Surgery

## 2024-05-08 ENCOUNTER — Encounter (HOSPITAL_BASED_OUTPATIENT_CLINIC_OR_DEPARTMENT_OTHER): Payer: Self-pay

## 2024-05-08 ENCOUNTER — Other Ambulatory Visit (HOSPITAL_BASED_OUTPATIENT_CLINIC_OR_DEPARTMENT_OTHER): Payer: Self-pay

## 2024-05-12 ENCOUNTER — Encounter: Payer: Self-pay | Admitting: Radiology

## 2024-05-14 NOTE — Progress Notes (Deleted)
 Office Visit Note  Patient: Theresa Sawyer             Date of Birth: [birth year]           MRN: [id number]             PCP: Kayla Jeoffrey RAMAN, FNP Referring: Kayla Jeoffrey RAMAN, FNP Visit Date: 05/28/2024 Occupation: Data Unavailable  Subjective:  No chief complaint on file.   History of Present Illness: EDDITH Sawyer is a 43 y.o. female ***     Activities of Daily Living:  Patient reports morning stiffness for *** {minute/hour:19697}.   Patient {ACTIONS;DENIES/REPORTS:21021675::Denies} nocturnal pain.  Difficulty dressing/grooming: {ACTIONS;DENIES/REPORTS:21021675::Denies} Difficulty climbing stairs: {ACTIONS;DENIES/REPORTS:21021675::Denies} Difficulty getting out of chair: {ACTIONS;DENIES/REPORTS:21021675::Denies} Difficulty using hands for taps, buttons, cutlery, and/or writing: {ACTIONS;DENIES/REPORTS:21021675::Denies}  No Rheumatology ROS completed.   PMFS History:  Patient Active Problem List   Diagnosis Date Noted   Chronic pain syndrome 05/07/2024   Chronic pain 05/07/2024   Long term (current) use of opiate analgesic 05/07/2024   Low back pain, unspecified 05/07/2024   Scoliosis of thoracolumbar spine 05/07/2024   Right knee pain 05/07/2024   Plantar fasciitis 05/07/2024   Postherpetic neuralgia 04/28/2024   Leukocytosis 04/28/2024   Left upper quadrant abdominal pain 04/28/2024   Erythrasma 04/22/2024   Gastroesophageal reflux disease 04/22/2024   Elevated liver enzymes 11/06/2023   Physical exam, annual 10/10/2023   Morbid obesity (HCC) 10/10/2023   Left flank pain 10/10/2023   Thyroid  nodule 10/10/2023   Gastroesophageal reflux disease with esophagitis without hemorrhage 10/10/2023   Aortic atherosclerosis 10/10/2023   Depression 10/05/2022   Encounter to establish care with new doctor 08/23/2022   Tobacco dependence due to cigarettes 08/23/2022   Acute cholecystitis 10/24/2021   Dyslipidemia 10/30/2020   Prediabetes 10/30/2020   Postoperative  state 09/28/2020   Essential hypertension 08/04/2020   Vitamin D  deficiency 08/04/2020   Gastroesophageal reflux disease without esophagitis 08/04/2020   Migraine without aura and without status migrainosus, not intractable 08/04/2020   Primary osteoarthritis involving multiple joints 03/18/2020   Dysfunctional uterine bleeding 03/18/2020   Generalized anxiety disorder 03/18/2020   Psoriatic arthritis (HCC) 01/24/2020   Primary osteoarthritis of both knees 01/24/2020   History of hyperlipidemia 01/24/2020   History of iron deficiency anemia 01/24/2020   BMI 40.0-44.9, adult (HCC) 01/24/2020   OSA on CPAP 04/24/2019   Psoriasis 12/05/2018   Situational anxiety 12/05/2018    Past Medical History:  Diagnosis Date   Abnormal uterine bleeding (AUB)    Anxiety    Arthritis    oa   Depression    GERD (gastroesophageal reflux disease)    History of chest pain (10-20-2019  pt denies any cardiac s&s)   mutliple ED visits, atypical chest pain, chest wall pain, muscularskeletal chest pain;  pt had cardiology evaulation dated 04-23-2018 note in epic , state atypical chest wall pain with negative d dimer and troponin's multiple times in epic, suggested CTA  (pt did not get done)   Hyperlipidemia    Hypertension    IDA (iron deficiency anemia)    none recently   Menorrhagia    Migraine    OSA on CPAP    followed by dr ather cpap set on 7 to 14   Osteoporosis    Psoriasis    Sleep apnea    Urinary frequency     Family History  Problem Relation Age of Onset   Anxiety disorder Mother    Kidney disease Mother    Alcohol  abuse Father    Mental illness Father    Psoriasis Sister    Other Sister        pre-diabetic   Arthritis Sister    Depression Sister    Heart attack Brother 10       deceased from this   Anxiety disorder Brother    Hypertension Brother    Diabetes Maternal Grandmother    Cirrhosis Maternal Grandfather        alcohol related   Diabetes Paternal Grandmother     Allergy (severe) Son        & nosebleeds   Colon cancer Neg Hx    Esophageal cancer Neg Hx    Colon polyps Neg Hx    Stomach cancer Neg Hx    Pancreatic cancer Neg Hx    Past Surgical History:  Procedure Laterality Date   ABDOMINAL HYSTERECTOMY     CHOLECYSTECTOMY N/A 10/25/2021   Procedure: LAPAROSCOPIC CHOLECYSTECTOMY with INTRAOPERATIVE CHOLANGIOGRAM;  Surgeon: Sheldon Standing, MD;  Location: [facility];  Service: General;  Laterality: N/A;   DILATATION & CURETTAGE/HYSTEROSCOPY WITH MYOSURE N/A 10/24/2019   Procedure: DILATATION & CURETTAGE/HYSTEROSCOPY WITH MYOSURE;  Surgeon: Arnaldo Purchase, MD;  Location: [facility];  Service: Gynecology;  Laterality: N/A;   HYSTEROSCOPY WITH NOVASURE N/A 04/06/2020   Procedure: DILATION AND CURRETTAGE; NOVASURE ABLATION;  Surgeon: Arnaldo Purchase, MD;  Location: [facility];  Service: Gynecology;  Laterality: N/A;   LAPAROSCOPY N/A 04/06/2020   Procedure: LAPAROSCOPY OPERATIVE; LYSIS OF ADHESION;  Surgeon: Arnaldo Purchase, MD;  Location: [facility] [place];  Service: Gynecology;  Laterality: N/A;   ROBOTIC ASSISTED TOTAL HYSTERECTOMY WITH BILATERAL SALPINGO OOPHERECTOMY Bilateral 09/28/2020   Procedure: XI ROBOTIC ASSISTED TOTAL LAPAROSCOPIC HYSTERECTOMY WITH BILATERAL SALPINGO OOPHORECTOMY;  Surgeon: Lavoie, Marie-Lyne, MD;  Location: [facility];  Service: Gynecology;  Laterality: Bilateral;  request 8:30am OR start time in IQUEUE held time for Dr. Lavoie requests 2 hours KATHY CONFIRMED ON 2/10 W/NICK FOR  TRACY (RNFA) TO ASSIST   TUBAL LIGATION Bilateral 02/05/2013   Procedure: POST PARTUM TUBAL LIGATION;  Surgeon: Lynwood KANDICE Solomons, MD;  Location: [facility];  Service: Gynecology;  Laterality: Bilateral;    Filshie clips   Social History   Tobacco Use   Smoking status: Every Day    Current packs/day: 0.50    Average packs/day: 0.5 packs/day for 20.0 years (10.0 ttl pk-yrs)    Types: Cigarettes     Passive exposure: Never   Smokeless tobacco: Never  Vaping Use   Vaping status: Never Used  Substance Use Topics   Alcohol use: No   Drug use: Never   Social History   Social History Narrative   Not on file     Immunization History  Administered Date(s) Administered   Rho (D) Immune Globulin  07/10/2012, 12/30/2012, 02/05/2013   Tdap 02/06/2013     Objective: Vital Signs: LMP 09/01/2020    Physical Exam   Musculoskeletal Exam: ***  CDAI Exam: CDAI Score: -- Patient Global: --; Provider Global: -- Swollen: --; Tender: -- Joint Exam 05/28/2024   No joint exam has been documented for this visit   There is currently no information documented on the homunculus. Go to the Rheumatology activity and complete the homunculus joint exam.  Investigation: No additional findings.  Imaging: XR Pelvis 1-2 Views Result Date: 04/22/2024 No SI joint sclerosis or narrowing was noted. Impression: Unremarkable x-rays of the pelvis.  XR Foot 2 Views Left Result Date: 04/22/2024 First MTP,  PIP and DIP narrowing was noted.  No intertarsal, tibiotalar or subtalar joint space narrowing was noted.  Inferior and posterior calcaneal spurs were noted.  No erosive changes were noted. Impression: These findings suggestive of osteoarthritis of the foot.  XR Foot 2 Views Right Result Date: 04/22/2024 First MTP, PIP and DIP narrowing was noted.  No intertarsal, tibiotalar or subtalar joint space narrowing was noted.  Inferior and posterior calcaneal spurs were noted.  No erosive changes were noted. Impression: These findings suggestive of osteoarthritis of the foot.  XR KNEE 3 VIEW LEFT Result Date: 04/22/2024 Mild medial compartment narrowing with intercondylar osteophytes were noted.  No patellofemoral narrowing was noted.  No chondrocalcinosis was noted. Impression: These findings suggestive of mild osteoarthritis.  XR KNEE 3 VIEW RIGHT Result Date: 04/22/2024 Mild medial compartment  narrowing with intercondylar osteophytes were noted.  No patellofemoral narrowing was noted.  No chondrocalcinosis was noted. Impression: These findings suggestive of mild osteoarthritis.  XR Hand 2 View Left Result Date: 04/22/2024 CMC, PIP and DIP narrowing was noted.  No MCP, intercarpal or radiocarpal joint space narrowing was noted. Impression: These findings are suggestive of osteoarthritis of the hand.  XR Hand 2 View Right Result Date: 04/22/2024 CMC, PIP and DIP narrowing was noted.  No MCP, intercarpal or radiocarpal joint space narrowing was noted. Impression: These findings are suggestive of osteoarthritis of the hand.  CT Angio Chest PE W and/or Wo Contrast Result Date: 04/21/2024 CLINICAL DATA:  Left-sided chest pain. EXAM: CT ANGIOGRAPHY CHEST WITH CONTRAST TECHNIQUE: Multidetector CT imaging of the chest was performed using the standard protocol during bolus administration of intravenous contrast. Multiplanar CT image reconstructions and MIPs were obtained to evaluate the vascular anatomy. RADIATION DOSE REDUCTION: This exam was performed according to the departmental dose-optimization program which includes automated exposure control, adjustment of the mA and/or kV according to patient size and/or use of iterative reconstruction technique. CONTRAST:  76mL OMNIPAQUE  IOHEXOL  350 MG/ML SOLN COMPARISON:  October 01, 2023 FINDINGS: Cardiovascular: Satisfactory opacification of the pulmonary arteries to the segmental level. No evidence of pulmonary embolism. Normal heart size. No pericardial effusion. Mediastinum/Nodes: No enlarged mediastinal, hilar, or axillary lymph nodes. The trachea and esophagus demonstrate no significant findings. A 1.9 cm low-attenuation thyroid  nodule is seen within the left lobe of the thyroid  gland. Lungs/Pleura: Lungs are clear. No pleural effusion or pneumothorax. Upper Abdomen: There is diffuse fatty infiltration of the liver parenchyma. Multiple surgical clips are  seen within the gallbladder fossa. Musculoskeletal: No chest wall abnormality. No acute or significant osseous findings. Review of the MIP images confirms the above findings. IMPRESSION: 1. No evidence of pulmonary embolism or acute cardiopulmonary disease. 2. Hepatic steatosis. 3. Evidence of prior cholecystectomy. 4. Stable low-attenuation left thyroid  nodule. Correlation with nonemergent thyroid  ultrasound is recommended. Electronically Signed   By: Suzen Dials M.D.   On: 04/21/2024 10:13   DG Chest 2 View Result Date: 04/21/2024 CLINICAL DATA:  Chest pain EXAM: CHEST - 2 VIEW COMPARISON:  10/01/2023 FINDINGS: The lungs are clear without focal pneumonia, edema, pneumothorax or pleural effusion. The cardiopericardial silhouette is within normal limits for size. No acute bony abnormality. Telemetry leads overlie the chest. IMPRESSION: No active cardiopulmonary disease. Electronically Signed   By: Camellia Candle M.D.   On: 04/21/2024 08:59    Recent Labs: Lab Results  Component Value Date   WBC 14.0 (H) 04/22/2024   HGB 16.0 (H) 04/22/2024   PLT 362 04/22/2024   NA 138 04/22/2024  K 4.3 04/22/2024   CL 102 04/22/2024   CO2 25 04/22/2024   GLUCOSE 80 04/22/2024   BUN 16 04/22/2024   CREATININE 0.91 04/22/2024   BILITOT 0.7 04/22/2024   ALKPHOS 94 03/31/2024   AST 27 04/22/2024   ALT 47 (H) 04/22/2024   PROT 7.7 04/22/2024   ALBUMIN 4.6 03/31/2024   CALCIUM  9.8 04/22/2024   GFRAA 122 07/28/2020   QFTBGOLDPLUS NEGATIVE 04/22/2024   April 22, 2024 TB Gold negative, hemoglobin A1c 5.4, sed rate 2, RF negative, anti-CCP negative, hepatitis B surface antibody reactive, antigen nonreactive, core nonreactive, TSH normal  Speciality Comments: MTX- fatigue Otezla -inadequate response Cosentyx-denied by insurance  Procedures:  No procedures performed Allergies: Ibuprofen  and Tylenol  [acetaminophen ]   Assessment / Plan:     Visit Diagnoses: No diagnosis found.  Orders: No orders  of the defined types were placed in this encounter.  No orders of the defined types were placed in this encounter.   Face-to-face time spent with patient was *** minutes. Greater than 50% of time was spent in counseling and coordination of care.  Follow-Up Instructions: No follow-ups on file.   Maya Nash, MD  Note - This record has been created using Animal nutritionist.  Chart creation errors have been sought, but may not always  have been located. Such creation errors do not reflect on  the standard of medical care.

## 2024-05-26 NOTE — Telephone Encounter (Signed)
 Application has been resent via DocuSign, attempted to contact pt to discuss however her VM box is full and I was unable to leave a message. Will reach out to her via MyChart.

## 2024-05-28 ENCOUNTER — Ambulatory Visit: Admitting: Rheumatology

## 2024-05-28 ENCOUNTER — Ambulatory Visit (HOSPITAL_BASED_OUTPATIENT_CLINIC_OR_DEPARTMENT_OTHER): Admitting: Orthopaedic Surgery

## 2024-05-28 DIAGNOSIS — E041 Nontoxic single thyroid nodule: Secondary | ICD-10-CM

## 2024-05-28 DIAGNOSIS — L603 Nail dystrophy: Secondary | ICD-10-CM

## 2024-05-28 DIAGNOSIS — F32A Depression, unspecified: Secondary | ICD-10-CM

## 2024-05-28 DIAGNOSIS — G8929 Other chronic pain: Secondary | ICD-10-CM

## 2024-05-28 DIAGNOSIS — R198 Other specified symptoms and signs involving the digestive system and abdomen: Secondary | ICD-10-CM

## 2024-05-28 DIAGNOSIS — L4050 Arthropathic psoriasis, unspecified: Secondary | ICD-10-CM

## 2024-05-28 DIAGNOSIS — K2100 Gastro-esophageal reflux disease with esophagitis, without bleeding: Secondary | ICD-10-CM

## 2024-05-28 DIAGNOSIS — M170 Bilateral primary osteoarthritis of knee: Secondary | ICD-10-CM

## 2024-05-28 DIAGNOSIS — R748 Abnormal levels of other serum enzymes: Secondary | ICD-10-CM

## 2024-05-28 DIAGNOSIS — I700 Atherosclerosis of aorta: Secondary | ICD-10-CM

## 2024-05-28 DIAGNOSIS — G4733 Obstructive sleep apnea (adult) (pediatric): Secondary | ICD-10-CM

## 2024-05-28 DIAGNOSIS — G43009 Migraine without aura, not intractable, without status migrainosus: Secondary | ICD-10-CM

## 2024-05-28 DIAGNOSIS — M79641 Pain in right hand: Secondary | ICD-10-CM

## 2024-05-28 DIAGNOSIS — Z79899 Other long term (current) drug therapy: Secondary | ICD-10-CM

## 2024-05-28 DIAGNOSIS — E559 Vitamin D deficiency, unspecified: Secondary | ICD-10-CM

## 2024-05-28 DIAGNOSIS — E785 Hyperlipidemia, unspecified: Secondary | ICD-10-CM

## 2024-05-28 DIAGNOSIS — D72829 Elevated white blood cell count, unspecified: Secondary | ICD-10-CM

## 2024-05-28 DIAGNOSIS — I10 Essential (primary) hypertension: Secondary | ICD-10-CM

## 2024-05-28 DIAGNOSIS — R7303 Prediabetes: Secondary | ICD-10-CM

## 2024-05-28 DIAGNOSIS — Z840 Family history of diseases of the skin and subcutaneous tissue: Secondary | ICD-10-CM

## 2024-05-28 DIAGNOSIS — B379 Candidiasis, unspecified: Secondary | ICD-10-CM

## 2024-05-28 DIAGNOSIS — M79671 Pain in right foot: Secondary | ICD-10-CM

## 2024-05-28 DIAGNOSIS — L409 Psoriasis, unspecified: Secondary | ICD-10-CM

## 2024-05-28 DIAGNOSIS — F17200 Nicotine dependence, unspecified, uncomplicated: Secondary | ICD-10-CM

## 2024-06-03 NOTE — Telephone Encounter (Signed)
 Called patient about [employer] forms --she states she never received. Requested that we mail the forms to her at this point. Confirmed address on file. Forms placed in mail today with note to complete and return to office  Sherry Pennant, PharmD, MPH, BCPS, CPP Clinical Pharmacist [facility])

## 2024-06-12 ENCOUNTER — Other Ambulatory Visit: Payer: Self-pay | Admitting: *Deleted

## 2024-06-12 ENCOUNTER — Telehealth: Payer: Self-pay | Admitting: Orthopedic Surgery

## 2024-06-12 MED ORDER — PREDNISONE 5 MG PO TABS: ORAL_TABLET | ORAL | 0 refills | Status: DC

## 2024-06-12 NOTE — Telephone Encounter (Signed)
 Spoke w/the pt, she called wanting to reschedule an appointment that she missed and that she had scheduled online.  She wants to be seen for bil knees.  She had x-rays at [facility] on 04/22/24 and has also been seen by [facility] and had x-rays there as well.  I have advised her that we need the CD and notes from [facility]  Once we have that we will let Dr. Margrette review and advise.  She verbalized understanding.

## 2024-06-12 NOTE — Addendum Note (Signed)
 Addended by: CENA ALFONSO CROME on: 06/12/2024 04:35 PM   Modules accepted: Orders

## 2024-06-12 NOTE — Telephone Encounter (Signed)
Okay to send a prednisone taper starting at 20 mg and taper by 5 mg every 4 days.

## 2024-06-12 NOTE — Telephone Encounter (Signed)
Patient advised prescription for prednisone sent to the pharmacy.  

## 2024-06-12 NOTE — Telephone Encounter (Signed)
 Patient contacted the office stating she is having inflammation in her joints. Patient states this started a few days ago. Patient states it is in her knees, hands and feet. Patient states she is having a break out of her psoriasis as well. Patient states she is unable to take NSAIDS due to her labs. Patient would like to know if she can have a prednisone  taper. Please advise.

## 2024-06-17 ENCOUNTER — Ambulatory Visit: Admitting: Gastroenterology

## 2024-06-24 ENCOUNTER — Other Ambulatory Visit: Payer: Self-pay | Admitting: Family Medicine

## 2024-06-24 ENCOUNTER — Ambulatory Visit: Admitting: Family Medicine

## 2024-06-24 ENCOUNTER — Telehealth: Payer: Self-pay | Admitting: Family Medicine

## 2024-06-24 ENCOUNTER — Telehealth: Payer: Self-pay

## 2024-06-24 ENCOUNTER — Encounter: Payer: Self-pay | Admitting: Family Medicine

## 2024-06-24 VITALS — BP 138/90 | HR 101 | Ht 66.0 in | Wt 216.0 lb

## 2024-06-24 DIAGNOSIS — G4700 Insomnia, unspecified: Secondary | ICD-10-CM | POA: Diagnosis not present

## 2024-06-24 DIAGNOSIS — I10 Essential (primary) hypertension: Secondary | ICD-10-CM

## 2024-06-24 DIAGNOSIS — F332 Major depressive disorder, recurrent severe without psychotic features: Secondary | ICD-10-CM

## 2024-06-24 DIAGNOSIS — L4050 Arthropathic psoriasis, unspecified: Secondary | ICD-10-CM | POA: Diagnosis not present

## 2024-06-24 DIAGNOSIS — F17210 Nicotine dependence, cigarettes, uncomplicated: Secondary | ICD-10-CM

## 2024-06-24 DIAGNOSIS — Z23 Encounter for immunization: Secondary | ICD-10-CM | POA: Diagnosis not present

## 2024-06-24 MED ORDER — DULOXETINE HCL 30 MG PO CPEP: 30.0000 mg | ORAL_CAPSULE | Freq: Every day | ORAL | 0 refills | Status: DC

## 2024-06-24 MED ORDER — TRAMADOL HCL 50 MG PO TABS: 50.0000 mg | ORAL_TABLET | Freq: Three times a day (TID) | ORAL | 0 refills | Status: AC | PRN

## 2024-06-24 MED ORDER — BUSPIRONE HCL 5 MG PO TABS: 5.0000 mg | ORAL_TABLET | Freq: Two times a day (BID) | ORAL | 0 refills | Status: DC

## 2024-06-24 MED ORDER — AMITRIPTYLINE HCL 25 MG PO TABS: 25.0000 mg | ORAL_TABLET | Freq: Every day | ORAL | 0 refills | Status: DC

## 2024-06-24 NOTE — Telephone Encounter (Signed)
 Received call from [place] with E2C2 with Raven at [facility] holding on the other line. Raven stated they're concerned about the refill requested for Tramadol  since the patient already has a script from [facility] in [place] for percocet and other meds. Please advise Raven at [phone] and select the prescriber line.

## 2024-06-24 NOTE — Telephone Encounter (Signed)
-----   Message from Connie Emperor, MD sent at 06/24/2024 12:02 PM EST ----- Regarding: pain management If she is seeing pain management she cannot get tramadol  from me.  Thanks.

## 2024-06-24 NOTE — Progress Notes (Signed)
 Patient Office Visit  Assessment & Plan:  Severe episode of recurrent major depressive disorder, without psychotic features (HCC) -     Ambulatory referral to Psychiatry -     DULoxetine  HCl; Take 1 capsule (30 mg total) by mouth daily.  Dispense: 90 capsule; Refill: 0 -     busPIRone  HCl; Take 1 tablet (5 mg total) by mouth 2 (two) times daily.  Dispense: 60 tablet; Refill: 0  Psoriatic arthritis (HCC) -     traMADol  HCl; Take 1 tablet (50 mg total) by mouth every 8 (eight) hours as needed for up to 7 days.  Dispense: 21 tablet; Refill: 0  Needs flu shot  Insomnia, unspecified type -     Amitriptyline  HCl; Take 1 tablet (25 mg total) by mouth at bedtime.  Dispense: 90 tablet; Refill: 0  Tobacco dependence due to cigarettes  Essential hypertension   Assessment and Plan    Major depressive disorder, recurrent severe without psychotic features Recurrent severe depression with worsening symptoms and suicidal ideation. Lexapro  ineffective. Cymbalta  chosen for dual action on depression and pain. Buspar  considered for anxiety. - Discontinued Lexapro  10 mg. - Initiated Cymbalta  (duloxetine ). - Referred to psychiatry. - Prescribed Buspar . - Scheduled follow-up in 3-4 weeks.  Psoriatic arthritis and osteoarthritis Chronic pain inadequately managed. NSAIDs and Tylenol  contraindicated. Gabapentin not tolerated. Tramadol  considered for short-term relief. Cymbalta  may aid pain management. - Prescribed tramadol . - Increased amitriptyline  to 25 mg. - Monitor liver function tests.  Insomnia Chronic insomnia with ineffective amitriptyline  10 mg. Anxiety contributes to sleep disturbances. - Increased amitriptyline  to 25 mg. - Prescribed Buspar .  Nicotine  dependence, cigarettes Increased smoking due to stress. Previous Wellbutrin  attempts unsuccessful.  Essential hypertension Hypertension managed with valsartan . - Continue valsartan .     Is aware if worsening symptoms i.e. worsening  suicidal thoughts/ideation she needs to go to emergency room for further evaluation.  Patient can send us  a MyChart message after 2 weeks of being on Cymbalta  if she would like to increase to 60 mg once a day.     Return in about 3 weeks (around 07/15/2024), or if symptoms worsen or fail to improve.   Subjective:    Patient ID: Theresa Sawyer, female    DOB: 21-Jan-1981  Age: 43 y.o. MRN: [id number]  Chief Complaint  Patient presents with   Depression    Pt states that her mother had a stroke and she found her. She states that she is stressed and having panic attacks.     Depression        Discussed the use of AI scribe software for clinical note transcription with the patient, who gave verbal consent to proceed.  History of Present Illness       History of Present Illness [name] is a 43 year old female with depression and psoriatic arthritis who presents with worsening depression and pain management issues.  She is experiencing worsening depression, exacerbated by her mother's recent hospitalization due to a stroke. She feels overwhelmed and has thoughts of 'disappearing' but denies any active suicidal ideation. She has a history of depression and has been on Lexapro , initially at 20 mg, but currently at 10 mg, which she feels is no longer effective. There is a family history of severe depression. Significant stress is due to her mother's condition and the potential need for home care, which she feels unprepared to manage. She has an 43 year old son and is concerned about managing responsibilities towards him and her mother.  She would never commit suicide due to her love for her son  She has a history of osteoarthritis and psoriatic arthritis, contributing to chronic pain. Her current pain management regimen is ineffective. She does see Rheumatology re this and has appt next year. She has been taking amitriptyline  10mg  for shingles-related pain without relief. Gabapentin was tried in  the past but caused adverse effects. She reports that her liver enzymes have been elevated in the past, which has limited her use of certain pain medications. She has used tramadol  in the past without Tylenol  and found it effective.  She reports sleep disturbances, getting only four to five hours of sleep per night, with racing thoughts preventing sleep. Amitriptyline  at 10 mg makes her sleepy but does not sustain sleep. She has a history of sleep apnea and sees a neurologist for this condition.  She mentions a history of smoking and increased cigarette use due to stress. She has attempted to quit smoking in the past using Wellbutrin , but it was not effective. She denies alcohol and illegal drug use.  She has a history of vitamin D  deficiency and is supposed to take 50,000 units weekly, but feels over-the-counter supplements are not equivalent to her prescribed regimen. Her vitamin D  levels were last checked in April and were on the low end of normal.  She has a history of multiple family losses, including her brother and cousin, contributing to her current emotional state. She feels 'drained' and lacks energy, with difficulty focusing and organizing thoughts.  Physical Exam HEENT: Ears appear irritated, left external canal slightly erythematous  Results Labs LFTs: Mildly elevated liver enzymes WBC: Elevated Vitamin D  (10/2023): Low-normal at 35  Assessment and Plan Major depressive disorder, recurrent severe without psychotic features Recurrent severe depression with worsening symptoms and suicidal ideation. Lexapro  ineffective. Cymbalta  chosen for dual action on depression and pain. Buspar  considered for anxiety. - Discontinued Lexapro  10 mg. - Initiated Cymbalta  (duloxetine ) 30mg  - Referred to psychiatry. - Prescribed Buspar  for anxiety - Scheduled follow-up in 3 weeks or sooner if nec.   Psoriatic arthritis and osteoarthritis Chronic pain inadequately managed. NSAIDs and Tylenol   contraindicated. Gabapentin not tolerated. Tramadol  considered for short-term relief. Cymbalta  may aid pain management. - Prescribed tramadol . - Increased amitriptyline  to 25 mg. - Monitor liver function tests.  Insomnia Chronic insomnia with ineffective amitriptyline  10 mg. Anxiety contributes to sleep disturbances. - Increased amitriptyline  to 25 mg. - Prescribed Buspar  for anxiety attacks.   Nicotine  dependence, cigarettes Increased smoking due to stress. Previous Wellbutrin  attempts unsuccessful.  Essential hypertension Hypertension managed with valsartan . - Continue valsartan .    The ASCVD Risk score (Arnett DK, et al., 2019) failed to calculate for the following reasons:   The valid total cholesterol range is 130 to 320 mg/dL  Past Medical History:  Diagnosis Date   Abnormal uterine bleeding (AUB)    Anxiety    Arthritis    oa   Depression    GERD (gastroesophageal reflux disease)    History of chest pain (10-20-2019  pt denies any cardiac s&s)   mutliple ED visits, atypical chest pain, chest wall pain, muscularskeletal chest pain;  pt had cardiology evaulation dated 04-23-2018 note in epic , state atypical chest wall pain with negative d dimer and troponin's multiple times in epic, suggested CTA  (pt did not get done)   Hyperlipidemia    Hypertension    IDA (iron deficiency anemia)    none recently   Menorrhagia    Migraine  OSA on CPAP    followed by dr ather cpap set on 7 to 14   Osteoporosis    Psoriasis    Sleep apnea    Urinary frequency    Past Surgical History:  Procedure Laterality Date   ABDOMINAL HYSTERECTOMY     CHOLECYSTECTOMY N/A 10/25/2021   Procedure: LAPAROSCOPIC CHOLECYSTECTOMY with INTRAOPERATIVE CHOLANGIOGRAM;  Surgeon: Sheldon Standing, MD;  Location: [facility];  Service: General;  Laterality: N/A;   DILATATION & CURETTAGE/HYSTEROSCOPY WITH MYOSURE N/A 10/24/2019   Procedure: DILATATION & CURETTAGE/HYSTEROSCOPY WITH MYOSURE;  Surgeon: Arnaldo Purchase, MD;  Location: [facility];  Service: Gynecology;  Laterality: N/A;   HYSTEROSCOPY WITH NOVASURE N/A 04/06/2020   Procedure: DILATION AND CURRETTAGE; NOVASURE ABLATION;  Surgeon: Arnaldo Purchase, MD;  Location: [facility];  Service: Gynecology;  Laterality: N/A;   LAPAROSCOPY N/A 04/06/2020   Procedure: LAPAROSCOPY OPERATIVE; LYSIS OF ADHESION;  Surgeon: Arnaldo Purchase, MD;  Location: [facility];  Service: Gynecology;  Laterality: N/A;   ROBOTIC ASSISTED TOTAL HYSTERECTOMY WITH BILATERAL SALPINGO OOPHERECTOMY Bilateral 09/28/2020   Procedure: XI ROBOTIC ASSISTED TOTAL LAPAROSCOPIC HYSTERECTOMY WITH BILATERAL SALPINGO OOPHORECTOMY;  Surgeon: Lavoie, Marie-Lyne, MD;  Location: [facility] ;  Service: Gynecology;  Laterality: Bilateral;  request 8:30am OR start time in IQUEUE held time for Dr. Lavoie requests 2 hours KATHY CONFIRMED ON 2/10 W/NICK FOR  TRACY (RNFA) TO ASSIST   TUBAL LIGATION Bilateral 02/05/2013   Procedure: POST PARTUM TUBAL LIGATION;  Surgeon: Lynwood KANDICE Solomons, MD;  Location: [facility];  Service: Gynecology;  Laterality: Bilateral;    Filshie clips   Social History[1] Family History  Problem Relation Age of Onset   Anxiety disorder Mother    Kidney disease Mother    Alcohol abuse Father    Mental illness Father    Psoriasis Sister    Other Sister        pre-diabetic   Arthritis Sister    Depression Sister    Heart attack Brother 34       deceased from this   Anxiety disorder Brother    Hypertension Brother    Diabetes Maternal Grandmother    Cirrhosis Maternal Grandfather        alcohol related   Diabetes Paternal Grandmother    Allergy (severe) Son        & nosebleeds   Colon cancer Neg Hx    Esophageal cancer Neg Hx    Colon polyps Neg Hx    Stomach cancer Neg Hx    Pancreatic cancer Neg Hx    Allergies[2]  Review of Systems  Psychiatric/Behavioral:  Positive for depression.        Objective:    BP (!) 138/90   Pulse (!) 101   Ht 5' 6 (1.676 m)   Wt 216 lb (98 kg)   LMP 09/01/2020   SpO2 98%   BMI 34.86 kg/m  BP Readings from Last 3 Encounters:  06/24/24 (!) 138/90  05/05/24 (!) 150/98  04/28/24 (!) 142/82   Wt Readings from Last 3 Encounters:  06/24/24 216 lb (98 kg)  05/05/24 219 lb 6.4 oz (99.5 kg)  04/28/24 221 lb 12.8 oz (100.6 kg)    Physical Exam Vitals and nursing note reviewed.  Constitutional:      General: She is not in acute distress.    Appearance: Normal appearance.  HENT:     Head: Normocephalic.     Right Ear: Tympanic membrane, ear canal and external ear  normal.     Left Ear: Tympanic membrane, ear canal and external ear normal.  Eyes:     Extraocular Movements: Extraocular movements intact.     Pupils: Pupils are equal, round, and reactive to light.  Cardiovascular:     Rate and Rhythm: Normal rate and regular rhythm.     Heart sounds: Normal heart sounds.  Pulmonary:     Effort: Pulmonary effort is normal.     Breath sounds: Normal breath sounds. No wheezing.  Musculoskeletal:     Right lower leg: No edema.     Left lower leg: No edema.  Neurological:     General: No focal deficit present.     Mental Status: She is alert and oriented to person, place, and time.  Psychiatric:        Attention and Perception: Attention normal.        Mood and Affect: Mood is anxious.        Speech: Speech normal.        Behavior: Behavior normal.        Thought Content: Thought content normal.        Cognition and Memory: Cognition normal.        Judgment: Judgment normal.      No results found for any visits on 06/24/24.          [1]  Social History Tobacco Use   Smoking status: Every Day    Current packs/day: 0.50    Average packs/day: 0.5 packs/day for 20.0 years (10.0 ttl pk-yrs)    Types: Cigarettes    Passive exposure: Never   Smokeless tobacco: Never  Vaping Use   Vaping status: Never Used  Substance Use  Topics   Alcohol use: No   Drug use: Never  [2]  Allergies Allergen Reactions   Ibuprofen      Elevated LFTs and upset stomach    Tylenol  [Acetaminophen ]     Elevated LFTs and upset stomach

## 2024-06-24 NOTE — Telephone Encounter (Signed)
 Spoke with Theresa Sawyer, I informed her that we needed to cancel the script for Tramadol  due to not being aware that patient was being prescribed other controlled substances.

## 2024-06-26 ENCOUNTER — Other Ambulatory Visit: Payer: Self-pay | Admitting: Family Medicine

## 2024-07-14 ENCOUNTER — Other Ambulatory Visit (HOSPITAL_BASED_OUTPATIENT_CLINIC_OR_DEPARTMENT_OTHER): Payer: Self-pay

## 2024-07-14 ENCOUNTER — Encounter (HOSPITAL_BASED_OUTPATIENT_CLINIC_OR_DEPARTMENT_OTHER): Payer: Self-pay

## 2024-07-14 ENCOUNTER — Other Ambulatory Visit: Payer: Self-pay

## 2024-07-14 ENCOUNTER — Emergency Department (HOSPITAL_BASED_OUTPATIENT_CLINIC_OR_DEPARTMENT_OTHER)
Admission: EM | Admit: 2024-07-14 | Discharge: 2024-07-14 | Disposition: A | Discharge status: Home / Self Care | Source: Ambulatory Visit | Attending: Emergency Medicine | Admitting: Emergency Medicine

## 2024-07-14 ENCOUNTER — Inpatient Hospital Stay: Admitting: Oncology

## 2024-07-14 ENCOUNTER — Emergency Department (HOSPITAL_BASED_OUTPATIENT_CLINIC_OR_DEPARTMENT_OTHER): Admitting: Radiology

## 2024-07-14 ENCOUNTER — Emergency Department (HOSPITAL_BASED_OUTPATIENT_CLINIC_OR_DEPARTMENT_OTHER)

## 2024-07-14 ENCOUNTER — Ambulatory Visit: Payer: Self-pay

## 2024-07-14 ENCOUNTER — Inpatient Hospital Stay

## 2024-07-14 DIAGNOSIS — R1012 Left upper quadrant pain: Secondary | ICD-10-CM | POA: Insufficient documentation

## 2024-07-14 DIAGNOSIS — R1032 Left lower quadrant pain: Secondary | ICD-10-CM | POA: Insufficient documentation

## 2024-07-14 DIAGNOSIS — R000 Tachycardia, unspecified: Secondary | ICD-10-CM | POA: Insufficient documentation

## 2024-07-14 DIAGNOSIS — R140 Abdominal distension (gaseous): Secondary | ICD-10-CM | POA: Insufficient documentation

## 2024-07-14 DIAGNOSIS — R197 Diarrhea, unspecified: Secondary | ICD-10-CM | POA: Diagnosis not present

## 2024-07-14 DIAGNOSIS — R110 Nausea: Secondary | ICD-10-CM | POA: Diagnosis not present

## 2024-07-14 DIAGNOSIS — R109 Unspecified abdominal pain: Secondary | ICD-10-CM

## 2024-07-14 LAB — DIFFERENTIAL
Abs Immature Granulocytes: 0.04 K/uL (ref 0.00–0.07)
Basophils Absolute: 0.2 K/uL — ABNORMAL HIGH (ref 0.0–0.1)
Basophils Relative: 1 %
Eosinophils Absolute: 0.2 K/uL (ref 0.0–0.5)
Eosinophils Relative: 2 %
Immature Granulocytes: 0 %
Lymphocytes Relative: 26 %
Lymphs Abs: 3.3 K/uL (ref 0.7–4.0)
Monocytes Absolute: 1 K/uL (ref 0.1–1.0)
Monocytes Relative: 8 %
Neutro Abs: 8.1 K/uL — ABNORMAL HIGH (ref 1.7–7.7)
Neutrophils Relative %: 63 %

## 2024-07-14 LAB — CBC
HCT: 49.6 % — ABNORMAL HIGH (ref 36.0–46.0)
Hemoglobin: 16.9 g/dL — ABNORMAL HIGH (ref 12.0–15.0)
MCH: 30.2 pg (ref 26.0–34.0)
MCHC: 34.1 g/dL (ref 30.0–36.0)
MCV: 88.6 fL (ref 80.0–100.0)
Platelets: 390 K/uL (ref 150–400)
RBC: 5.6 MIL/uL — ABNORMAL HIGH (ref 3.87–5.11)
RDW: 13.1 % (ref 11.5–15.5)
WBC: 13.1 K/uL — ABNORMAL HIGH (ref 4.0–10.5)
nRBC: 0 % (ref 0.0–0.2)

## 2024-07-14 LAB — BASIC METABOLIC PANEL WITH GFR
Anion gap: 13 (ref 5–15)
BUN: 8 mg/dL (ref 6–20)
CO2: 22 mmol/L (ref 22–32)
Calcium: 10.6 mg/dL — ABNORMAL HIGH (ref 8.9–10.3)
Chloride: 103 mmol/L (ref 98–111)
Creatinine, Ser: 0.78 mg/dL (ref 0.44–1.00)
GFR, Estimated: >60 mL/min
Glucose, Bld: 119 mg/dL — ABNORMAL HIGH (ref 70–99)
Potassium: 4.1 mmol/L (ref 3.5–5.1)
Sodium: 138 mmol/L (ref 135–145)

## 2024-07-14 LAB — URINALYSIS, ROUTINE W REFLEX MICROSCOPIC
Bilirubin Urine: NEGATIVE
Glucose, UA: NEGATIVE mg/dL
Hgb urine dipstick: NEGATIVE
Ketones, ur: NEGATIVE mg/dL
Leukocytes,Ua: NEGATIVE
Nitrite: NEGATIVE
Protein, ur: NEGATIVE mg/dL
Specific Gravity, Urine: <1.005 — ABNORMAL LOW (ref 1.005–1.030)
pH: 6.5 (ref 5.0–8.0)

## 2024-07-14 LAB — TROPONIN T, HIGH SENSITIVITY
Troponin T High Sensitivity: <15 ng/L (ref 0–19)
Troponin T High Sensitivity: <15 ng/L (ref 0–19)

## 2024-07-14 LAB — LIPASE, BLOOD: Lipase: 30 U/L (ref 11–51)

## 2024-07-14 LAB — LACTIC ACID, PLASMA: Lactic Acid, Venous: 0.8 mmol/L (ref 0.5–1.9)

## 2024-07-14 MED ORDER — METHOCARBAMOL 500 MG PO TABS
500.0000 mg | ORAL_TABLET | Freq: Three times a day (TID) | ORAL | 0 refills | Status: DC | PRN
  Filled 2024-07-14: qty 20, 7d supply, fill #0

## 2024-07-14 MED ORDER — HYDROMORPHONE HCL 1 MG/ML IJ SOLN
1.0000 mg | Freq: Once | INTRAMUSCULAR | Status: AC
  Administered 2024-07-14: 1 mg via INTRAVENOUS
  Filled 2024-07-14: qty 1

## 2024-07-14 MED ORDER — IOHEXOL 300 MG/ML  SOLN
100.0000 mL | Freq: Once | INTRAMUSCULAR | Status: AC | PRN
  Administered 2024-07-14: 100 mL via INTRAVENOUS

## 2024-07-14 MED ORDER — ONDANSETRON HCL 4 MG/2ML IJ SOLN
4.0000 mg | Freq: Once | INTRAMUSCULAR | Status: AC
  Administered 2024-07-14: 4 mg via INTRAVENOUS
  Filled 2024-07-14: qty 2

## 2024-07-14 NOTE — ED Triage Notes (Signed)
 Patient reports intermittent chest pain with pain radiating to her back. She says she has an unknown blood disorder and was being seen at the cancer center when they sent her down here. She said it was supposed to be her first appointment.

## 2024-07-14 NOTE — Progress Notes (Signed)
 Patient came for her New Hematology appointment with Dr. Autumn. Her appointment was for 1000. In addition, patient was advised by after hours phone service to go to the emergency room earlier due to complaints of chest pain. Because patient still had chest pain upon arriving for her Hematology appointment, she was redirected to the ED by front desk staff per Dr. Autumn. By the time this writer went to talk with patient, she was already walking to the parking garage. Front desk staff said she was going to the ED.

## 2024-07-14 NOTE — Discharge Instructions (Addendum)
 Thank you for visiting the Emergency Department today. It was a pleasure to be part of your healthcare team.   Your were seen today for abdominal pain, and workup was overall reassuring.  As discussed, hydrate, resume diet as normal, utilize your pain management prescription, and prescribed muscle relaxers. You should take your medications as directed. If you have any questions about your medicines, please call your pharmacy or healthcare provider.  It is important to watch for warning signs such as worsening pain, fever, trouble breathing, chest pain. If any of these happen, return to the Emergency Department or call 911.  Thank you for trusting us  with your health.

## 2024-07-14 NOTE — ED Notes (Signed)
 DC paperwork given and verbally understood.SABRA Pt aware and understood no drinking/driving due to the meds given.SABRASABRA

## 2024-07-14 NOTE — ED Provider Notes (Signed)
 " [place] EMERGENCY DEPARTMENT AT [facility] Provider Note   CSN: [id number] Arrival date & time: 07/14/24  9150     Patient presents with: Chest Pain   Theresa Sawyer is a 44 y.o. female past medical history of psoriatic arthritis, and unknown leukocytosis for which she is being seen by oncology for,  who presents with abdominal pain ongoing for 5 days.  The pain is described as sharp, constant, in her left upper and lower quadrant, with no clear radiation. She endorses associated nausea and diarrhea. The patient denies any symptoms including fever, chills, chest pain, shortness of breath, persistent vomiting, hematemesis, melena, hematochezia, dysuria, hematuria.  There is no history of recent abdominal trauma.  Patient was arrived to her oncologist's this morning for her first appointment for ongoing  unknown leukocytosis when they sent her to the emergency department due to ongoing abdominal pain.  The patient reports no recent travel, antibiotic use, or known sick contacts.  Oral intake has been normal and bowel movements are loose with last bowel movement morning.  No alleviating or exacerbating factors were clearly identified.  The patient is in no acute distress.    Chest Pain Associated symptoms: abdominal pain        Prior to Admission medications  Medication Sig Start Date End Date Taking? Authorizing Provider  amitriptyline  (ELAVIL ) 25 MG tablet Take 1 tablet (25 mg total) by mouth at bedtime. 07/15/24   Rankin, Shuvon B, NP  busPIRone  (BUSPAR ) 10 MG tablet Take 0.5 tablets (5 mg total) by mouth 3 (three) times daily. 07/15/24   Rankin, Shuvon B, NP  DULoxetine  (CYMBALTA ) 30 MG capsule Take 1 capsule (30 mg total) by mouth 2 (two) times daily. 07/15/24   Rankin, Shuvon B, NP  famotidine  (PEPCID ) 20 MG tablet Take 1 tablet (20 mg total) by mouth 2 (two) times daily. 04/21/24   Jerrol Agent, MD  hydrOXYzine  (VISTARIL ) 25 MG capsule Take 1 capsule (25 mg total) by mouth 3  (three) times daily as needed. 07/15/24   Rankin, Shuvon B, NP  ketoconazole  (NIZORAL ) 2 % cream Apply 1 Application topically 2 (two) times daily under both breasts 07/15/24   Kayla Jeoffrey RAMAN, FNP  lidocaine  (LIDODERM ) 5 % Place 1 patch onto the skin daily. Remove & Discard patch within 12 hours or as directed by MD 04/21/24   Jerrol Agent, MD  metFORMIN  (GLUCOPHAGE -XR) 500 MG 24 hr tablet Take 1 tablet (500 mg total) by mouth 2 (two) times daily with a meal. 08/24/22   Kayla Jeoffrey RAMAN, FNP  oxyCODONE  (OXY IR/ROXICODONE ) 5 MG immediate release tablet Take 1 tablet (5 mg total) by mouth every 8 (eight) hours as needed for severe pain (pain score 7-10). 07/15/24   Kayla Jeoffrey RAMAN, FNP  pantoprazole  (PROTONIX ) 20 MG tablet Take 1 tablet (20 mg total) by mouth daily. 04/21/24 07/15/24  Jerrol Agent, MD  valsartan  (DIOVAN ) 80 MG tablet Take 2 tablets (160 mg total) by mouth daily. 07/15/24   Kayla Jeoffrey RAMAN, FNP    Allergies: Ibuprofen  and Tylenol  [acetaminophen ]    Review of Systems  Cardiovascular:  Positive for chest pain.  Gastrointestinal:  Positive for abdominal pain.    Updated Vital Signs BP (!) 148/80   Pulse 94   Temp 97.8 F (36.6 C)   Resp 15   LMP 09/01/2020   SpO2 100%   Physical Exam Vitals and nursing note reviewed.  Constitutional:      General: She is not in acute distress.  Appearance: Normal appearance.  HENT:     Head: Normocephalic and atraumatic.  Eyes:     Extraocular Movements: Extraocular movements intact.     Conjunctiva/sclera: Conjunctivae normal.     Pupils: Pupils are equal, round, and reactive to light.  Cardiovascular:     Rate and Rhythm: Regular rhythm. Tachycardia present.     Pulses: Normal pulses.  Pulmonary:     Effort: Pulmonary effort is normal. No respiratory distress.     Comments: Patient has no difficulty speaking in complete sentences. Abdominal:     General: Abdomen is flat. There is distension.     Palpations: Abdomen is soft.      Tenderness: There is abdominal tenderness in the left upper quadrant and left lower quadrant. There is left CVA tenderness. There is no guarding or rebound.     Comments: Tenderness to the left upper quadrant left lower quadrant.  No guarding, rebound. Mild abdominal distension.   Musculoskeletal:        General: Normal range of motion.     Cervical back: Normal range of motion.     Right lower leg: No edema.     Left lower leg: No edema.  Skin:    General: Skin is warm and dry.     Capillary Refill: Capillary refill takes less than 2 seconds.  Neurological:     General: No focal deficit present.     Mental Status: She is alert. Mental status is at baseline.  Psychiatric:        Mood and Affect: Mood normal.     (all labs ordered are listed, but only abnormal results are displayed) Labs Reviewed  BASIC METABOLIC PANEL WITH GFR - Abnormal; Notable for the following components:      Result Value   Glucose, Bld 119 (*)    Calcium  10.6 (*)    All other components within normal limits  CBC - Abnormal; Notable for the following components:   WBC 13.1 (*)    RBC 5.60 (*)    Hemoglobin 16.9 (*)    HCT 49.6 (*)    All other components within normal limits  URINALYSIS, ROUTINE W REFLEX MICROSCOPIC - Abnormal; Notable for the following components:   Color, Urine STRAW (*)    Specific Gravity, Urine <1.005 (*)    All other components within normal limits  DIFFERENTIAL - Abnormal; Notable for the following components:   Neutro Abs 8.1 (*)    Basophils Absolute 0.2 (*)    All other components within normal limits  LIPASE, BLOOD  LACTIC ACID, PLASMA  TROPONIN T, HIGH SENSITIVITY  TROPONIN T, HIGH SENSITIVITY    EKG: EKG Interpretation Date/Time:  Monday July 14 2024 08:57:14 EST Ventricular Rate:  104 PR Interval:  120 QRS Duration:  78 QT Interval:  334 QTC Calculation: 439 R Axis:   68  Text Interpretation: Sinus tachycardia Otherwise normal ECG When compared with ECG of  21-Apr-2024 07:49, No significant change was found Confirmed by Jerrol Agent ([id number]) on 07/14/2024 12:26:31 PM  Radiology: No results found.    Procedures   Medications Ordered in the ED  ondansetron  (ZOFRAN ) injection 4 mg (4 mg Intravenous Given 07/14/24 1122)  HYDROmorphone  (DILAUDID ) injection 1 mg (1 mg Intravenous Given 07/14/24 1124)  iohexol  (OMNIPAQUE ) 300 MG/ML solution 100 mL (100 mLs Intravenous Contrast Given 07/14/24 1133)  Medical Decision Making Amount and/or Complexity of Data Reviewed Labs: ordered. Decision-making details documented in ED Course. Radiology: ordered. Decision-making details documented in ED Course. ECG/medicine tests:  Decision-making details documented in ED Course.  Risk Prescription drug management.   Patient presents to the ED for: abdominal pain This involves an extensive number of treatment options  Differential diagnosis includes: Pancreatitis Gastritis, gastroenteritis Diverticulitis Other, intra-abdominal, infectious etiology Co-morbid conditions: Hypertension, GERD Additional history/records obtained and reviewed: Additional history obtained from  outside medical records External records from outside source obtained and reviewed including PCP records regarding ongoing leukocytosis without know etiology   Clinical Course as of 07/20/24 1153  Mon Jul 14, 2024  1102 Temp: 97.8 F (36.6 C) afebrile, vital stable, patient no acute distress. [ML]  1102 CBC(!) Leukocytosis -ongoing history, unknown cause [ML]  1102 Basic metabolic panel(!) No acute findings [ML]  1103 DG Chest 2 View No cardiopulmonary etiology [ML]  1223 Lactic acid, plasma 0.8 [ML]  1223 Lipase, blood WNL [ML]  1240 CT ABDOMEN PELVIS W CONTRAST No acute findings  [ML]  1240 ED EKG Sinus tachycardia [ML]    Clinical Course User Index [ML] Willma Duwaine CROME, PA    Data Reviewed / Actions Taken: Labs ordered/reviewed with my  independent interpretation in ED course above. Imaging ordered/reviewed with my independent interpretation in ED course above. I agree with the radiologists interpretation.  EKG ordered/reviewed with my independent interpretation in ED course above.   Management / Treatments: Patient given hydromorphone , ondansetron  for symptomatic relief-well-tolerated Reevaluation of the patient after these medicines showed that the patient improved. I have reviewed the patients home medicines and have made adjustments as needed  ED Course / Reassessments: Problem List: Abdominal pain 44 year old female presented for abdominal pain. Laboratory evaluation was without acute finding and CT demonstrated no acute intra-abdominal pathology.  Based on the patient's reassuring vital signs, negative CT imaging, and unremarkable diagnostic workup, there is low clinical suspicion for acute surgical abdomen, bowel obstruction, appendicitis, pancreatitis, ischemic bowel, or other emergent intra-abdominal process.  The patient was treated symptomatically with antiemetics and analgesic therapy with improvement of symptoms during the ED course.  Patient's pain likely gastroenteritis in nature given pain with associated diarrhea for the last few days. The patient remained clinically stable and deemed appropriate for outpatient management.  Discharge instructions included strict return precautions for worsening or localized abdominal pain, persistent vomiting, fever, inability to tolerate oral intake, syncope, or new concerning symptoms.  Patient was advised to follow-up with primary care and to continue supportive care at home. Patient's home medication of robaxin  refilled as requested.     Disposition: Disposition: Discharge with close follow-up with PCP for further evaluation and care Rationale for disposition: stable for discharge The disposition plan and rationale were discussed with the patient at the bedside, all questions  were addressed, and the patient demonstrated understanding.  This note was produced using Electronics Engineer. While I have reviewed and verified all clinical information, transcription errors may remain.      Final diagnoses:  Abdominal pain, unspecified abdominal location    ED Discharge Orders          Ordered    methocarbamol  (ROBAXIN ) 500 MG tablet  Every 8 hours PRN,   Status:  Discontinued        07/14/24 1302               Willma Duwaine CROME, [place] 07/20/24 1158    Jerrol Agent, MD 08/06/24 0130  "

## 2024-07-14 NOTE — Telephone Encounter (Signed)
 FYI Only or Action Required?: FYI only for provider: Heading to hospital.  Patient was last seen in primary care on 06/24/2024 by Aletha Bene, MD.  Called Nurse Triage reporting Chest Pain.  Triage cut short because of need for ambulance, pt declines ambulance and states she will have loved one drive her to hospital.  Symptoms are: rapidly worsening.  Triage Disposition: Call EMS 911 Now  Patient/caregiver understands and will follow disposition?: Yes     Copied from CRM #[id number]. Topic: Clinical - Red Word Triage >> Jul 14, 2024  7:40 AM Antony RAMAN wrote: Red Word that prompted transfer to Nurse Triage: severe pain in side going into knees Reason for Disposition  [1] Chest pain lasts > 5 minutes AND [2] age > 27  Answer Assessment - Initial Assessment Questions Triage cut short because of need for ambulance. This RN recommended pt be examined in hospital asap and be taken via ambulance. Pt confirms she will have a loved one drive her there. Sending message to PCP office for follow up.   Knees always have pain, bone spurs and such Have oncologist appt later Don't know if maybe they could help Wanted to see if could be seen 8 am this morning All on my left side, my chest, my side 7-7.5/10 pain, worse if exert self Lasts for a real while, confirms longer than 5 min Have somebody take me  Protocols used: Chest Pain-A-AH

## 2024-07-15 ENCOUNTER — Telehealth: Payer: Self-pay

## 2024-07-15 ENCOUNTER — Ambulatory Visit (INDEPENDENT_AMBULATORY_CARE_PROVIDER_SITE_OTHER): Admitting: Family Medicine

## 2024-07-15 ENCOUNTER — Encounter (HOSPITAL_COMMUNITY): Payer: Self-pay | Admitting: Registered Nurse

## 2024-07-15 ENCOUNTER — Encounter: Payer: Self-pay | Admitting: Family Medicine

## 2024-07-15 ENCOUNTER — Other Ambulatory Visit (HOSPITAL_BASED_OUTPATIENT_CLINIC_OR_DEPARTMENT_OTHER): Payer: Self-pay

## 2024-07-15 ENCOUNTER — Other Ambulatory Visit: Payer: Self-pay

## 2024-07-15 ENCOUNTER — Telehealth: Payer: Self-pay | Admitting: Oncology

## 2024-07-15 ENCOUNTER — Ambulatory Visit: Payer: Self-pay | Admitting: Family Medicine

## 2024-07-15 ENCOUNTER — Ambulatory Visit (INDEPENDENT_AMBULATORY_CARE_PROVIDER_SITE_OTHER): Payer: Self-pay | Admitting: Registered Nurse

## 2024-07-15 VITALS — BP 137/87 | HR 90 | Ht 66.0 in | Wt 210.8 lb

## 2024-07-15 DIAGNOSIS — F332 Major depressive disorder, recurrent severe without psychotic features: Secondary | ICD-10-CM | POA: Diagnosis not present

## 2024-07-15 DIAGNOSIS — I10 Essential (primary) hypertension: Secondary | ICD-10-CM

## 2024-07-15 DIAGNOSIS — F411 Generalized anxiety disorder: Secondary | ICD-10-CM | POA: Diagnosis not present

## 2024-07-15 DIAGNOSIS — R1012 Left upper quadrant pain: Secondary | ICD-10-CM | POA: Diagnosis not present

## 2024-07-15 DIAGNOSIS — M545 Low back pain, unspecified: Secondary | ICD-10-CM | POA: Diagnosis not present

## 2024-07-15 DIAGNOSIS — B0229 Other postherpetic nervous system involvement: Secondary | ICD-10-CM | POA: Diagnosis not present

## 2024-07-15 DIAGNOSIS — M170 Bilateral primary osteoarthritis of knee: Secondary | ICD-10-CM

## 2024-07-15 DIAGNOSIS — L304 Erythema intertrigo: Secondary | ICD-10-CM | POA: Diagnosis not present

## 2024-07-15 DIAGNOSIS — G4700 Insomnia, unspecified: Secondary | ICD-10-CM | POA: Diagnosis not present

## 2024-07-15 DIAGNOSIS — L4050 Arthropathic psoriasis, unspecified: Secondary | ICD-10-CM | POA: Diagnosis not present

## 2024-07-15 DIAGNOSIS — L409 Psoriasis, unspecified: Secondary | ICD-10-CM | POA: Diagnosis not present

## 2024-07-15 DIAGNOSIS — G894 Chronic pain syndrome: Secondary | ICD-10-CM

## 2024-07-15 MED ORDER — OXYCODONE HCL 5 MG PO TABS
5.0000 mg | ORAL_TABLET | Freq: Three times a day (TID) | ORAL | 0 refills | Status: DC | PRN
  Filled 2024-07-15: qty 90, 30d supply, fill #0

## 2024-07-15 MED ORDER — BUSPIRONE HCL 10 MG PO TABS
5.0000 mg | ORAL_TABLET | Freq: Three times a day (TID) | ORAL | 1 refills | Status: DC
  Filled 2024-07-15: qty 45, 30d supply, fill #0

## 2024-07-15 MED ORDER — HYDROXYZINE PAMOATE 25 MG PO CAPS
25.0000 mg | ORAL_CAPSULE | Freq: Three times a day (TID) | ORAL | 1 refills | Status: DC | PRN
  Filled 2024-07-15: qty 90, 30d supply, fill #0

## 2024-07-15 MED ORDER — KETOCONAZOLE 2 % EX CREA
1.0000 | TOPICAL_CREAM | Freq: Two times a day (BID) | CUTANEOUS | 3 refills | Status: AC
  Filled 2024-07-15: qty 30, 30d supply, fill #0

## 2024-07-15 MED ORDER — DULOXETINE HCL 30 MG PO CPEP
30.0000 mg | ORAL_CAPSULE | Freq: Two times a day (BID) | ORAL | 1 refills | Status: DC
  Filled 2024-07-15: qty 60, 30d supply, fill #0

## 2024-07-15 MED ORDER — VALSARTAN 80 MG PO TABS
160.0000 mg | ORAL_TABLET | Freq: Every day | ORAL | 1 refills | Status: AC
  Filled 2024-07-15: qty 60, 30d supply, fill #0

## 2024-07-15 MED ORDER — AMITRIPTYLINE HCL 25 MG PO TABS
25.0000 mg | ORAL_TABLET | Freq: Every day | ORAL | 1 refills | Status: DC
  Filled 2024-07-15: qty 30, 30d supply, fill #0

## 2024-07-15 NOTE — Patient Instructions (Addendum)
 If no one has contacted, you by the end of business day today please call the appropriate office listed below to schedule your next visit for medication management with Luisa Ruder, NP:    [facility] at [facility] [address] [place], [place], [place], [place] [address]  Phone: [phone] (Call to schedule appointment)  [facility] at [facility] [address] [place], [place] [address] Phone: [phone] (Call to schedule appointment)   Call 911, 988, mobile crisis, or present to the nearest emergency room should you experience any suicidal/homicidal ideation, auditory/visual/hallucinations, or detrimental worsening of your mental health.  Mobile Crisis Response Teams Listed by counties in vicinity of [facility] providers [facility] [phone] [facility] [phone] [facility] [phone] [facility] [phone] [place]                * [facility] [phone]                * [facility] [phone]  [facility] [phone] [facility]  [phone] * [facility] [phone]     Types of Psychotherapy Interpersonal Psychotherapy (IPT) Focuses on treating depression related to significant loss, life changes, or interpersonal conflict. Improves interpersonal relationships and social skills. Recommended for mood disorders, anxiety, PTSD, bipolar disorder, eating disorders, postpartum depression, and borderline personality disorder. Cognitive Behavioral Therapy (CBT) Identifies and addresses negative thought patterns and beliefs. Goal-oriented and focuses on solving current challenges. Effective for addiction, depression, OCD, anxiety, chronic pain,  bipolar disorder, anger management, personality disorders, eating disorders, marital conflict, and academic performance. Dialectical Behavioral Therapy (DBT) Balances acceptance and change by integrating opposing ideas. Helps identify and modify distressing behaviors. Initially developed for borderline personality disorder, but adapted for other conditions. Psychoanalytical and Psychodynamic Therapy Uncovers unconscious thoughts linked to childhood experiences. Long-term treatment performed by specially trained professionals. Used for chronic depression, anxiety disorders, somatic disorders, borderline personality disorder, PTSD, substance use disorders, and eating disorders. Humanistic Therapy Focuses on self-awareness and acceptance to reach full potential. Person-centered approach allowing clients to guide sessions. Recommended for trauma, depression, chronic conditions, anxiety, low self-esteem, relationship conflicts, personality disorders, addictions, and existential crises. Eclectic Therapy Combines techniques from different types of psychotherapy. Flexible and adapts to individual needs and goals. Can be short-term or long-term, often used for PTSD symptoms. Choosing the Right Therapy Consider the type of concern, duration, previous diagnosis, and whether you want to understand or change behaviors. Consultations with multiple therapists can help determine the best approach. Check therapist credentials and training. Formats of Therapy Individual Therapy: One-on-one sessions. Group Therapy: Sessions with multiple participants sharing common goals. Couple's or Family Therapy: Sessions involving family members or partners. Effectiveness of Psychotherapy Rooted in science and evidence-based. Effective for a variety of mental health conditions and personal challenges. Requires active participation and collaboration. Online Therapy As effective as in-person therapy. Ensure privacy,  verify therapist credentials, and maintain a conducive environment for sessions. This overview should give you a good understanding of the different psychotherapy options and how they can help.

## 2024-07-15 NOTE — Progress Notes (Signed)
 " Psychiatric Initial Adult Assessment   Patient Identification: Theresa Sawyer MRN:  [id number]  Virtual Visit via Video Note  I connected with Theresa Sawyer on 07/15/2024 at  1:00 PM EST by a video enabled telemedicine application and verified that I am speaking with the correct person using two identifiers.  Location: Patient: Home Provider: Davene Sawyer, Theresa Sawyer   I discussed the limitations of evaluation and management by telemedicine and the availability of in person appointments. The patient expressed understanding and agreed to proceed.    I discussed the assessment and treatment plan with the patient. The patient was provided an opportunity to ask questions and all were answered. The patient agreed with the plan and demonstrated an understanding of the instructions.   The patient was advised to call back or seek an in-person evaluation if the symptoms worsen or if the condition fails to improve as anticipated.  I provided 60 minutes of non-face-to-face time during this encounter.  I personally spent a total of 60 minutes in the care of the patient today including preparing to see the patient, getting/reviewing separately obtained history, performing a medically appropriate exam/evaluation, counseling and educating, placing orders, referring and communicating with other health care professionals, documenting clinical information in the EHR, independently interpreting results, coordinating care, conducting screenings PHQ-9, C-SSRS, GAD-7, AIMS, AUDIT, Nutrition, and Pain, reviewing labs, making counseling/therapy referral, discussing medication options, medication education, and discussing safety.   Theresa Ruder, NP   Date of Evaluation:  07/15/2024 Referral Source: Theresa Bene, MD [facility] Chief Complaint:   Chief Complaint  Patient presents with   Establish Care    Medication management   Visit Diagnosis:    ICD-10-CM   1. Severe episode of recurrent  major depressive disorder, without psychotic features (HCC)  F33.2 busPIRone  (BUSPAR ) 10 MG tablet    DULoxetine  (CYMBALTA ) 30 MG capsule    2. Insomnia, unspecified type  G47.00 amitriptyline  (ELAVIL ) 25 MG tablet    hydrOXYzine  (VISTARIL ) 25 MG capsule    3. GAD (generalized anxiety disorder)  F41.1 busPIRone  (BUSPAR ) 10 MG tablet    DULoxetine  (CYMBALTA ) 30 MG capsule    hydrOXYzine  (VISTARIL ) 25 MG capsule      History of Present Illness:  Theresa Sawyer 44 y.o. female presents today to establish care for medication management.  She was seen via virtual video visit by this provider and chart reviewed on 07/15/2024.  Her psychiatric history is significant for major depression, general anxiety, and insomnia.  Her mental health is currently managed with Cymbalta  30 mg daily, BuSpar  5 mg twice daily, and Amitriptyline  25 mg daily at bedtime.  She reports she has been taking current medications for at least 4 weeks and has not noticed any improvement.  She denies adverse reaction to medications.  She reports primary stressors are 1) financial stressors, 2) My mom had a stroke on December 6 and I found her.  They said she had a blood clot on her brain but were unable to do surgery without making her condition it worse.  I tried to get her into a nursing home for rehab but her insurance denied and now she has to have someone with her all the time.  I'm having to switch jobs or change my schedule around until I can get someone to be here with her while I am at work.  She also reports that she has gotten a letter for jury duty but because of her mental health she does not  feel she will be able to do it.  The last time I was in a court I had to sit through trial and my cousin was murdered.  6 months after that my brother died in 6 months after his death my Dad died.  I just do not think I can do it and I do not know what I need to do to get out of it. She reports there are times when she has passive suicidal  thoughts.  If it wasn't for my son I would probably think about it more often but my thoughts are more like I would be better off if I was not here or feeling like I want to jump out of my body.  But I love my son too much and I would never leave him.  She states her son is 36 years old.  Today she denies active/passive suicidal ideation and is able to contract for safety.  Denies history of prior suicide attempt.  She also denies self-harm/homicidal ideation, psychosis, paranoia, and abnormal movement.Screenings completed during today's visit PHQ-9, C-SSRS, GAD-7, AIMS, AUDIT, Nutrition, and Pain, see scores below.  Treatment option discussed: Discussed prior psychotropic medication trials and current medication regimen.  She has been on current medications for at least 4 weeks with no improvement.  Reports prior to starting current medications she was taking Lexapro .  Discussed medication adjustments and agreed to adjustments.  Discussed safety and referral for counseling/therapy.  Recommendations: Increase BuSpar  10 mg 3 times daily, Cymbalta  30 mg twice daily, continue Amitriptyline  25 mg daily at bedtime, and start Vistaril  25 mg 3 times daily as needed. She was educated on the side effect and efficacy profile of BuSpar , Cymbalta , amitriptyline , Vistaril , and educational material was added to AVS. She voiced understanding and agreement with today's plan and recommendations.  Associated Signs/Symptoms: Depression Symptoms:  depressed mood, anhedonia, insomnia, feelings of worthlessness/guilt, difficulty concentrating, recurrent thoughts of death, anxiety, loss of energy/fatigue, disturbed sleep, (Hypo) Manic Symptoms:  Irritable Mood, Anxiety Symptoms:  Excessive Worry, Panic Symptoms, Psychotic Symptoms:  Denies PTSD Symptoms: NA  Past Psychiatric History:  Diagnosis:  Major depression, general anxiety, insomnia Suicide attempt:  Denies prior history of suicide attempt Non-suicidal  self-injurious behavior:  Denies history of self injurious behavior Psychiatric hospitalization:  Denies prior psychiatric hospitalization Past trauma:  Denies history of abuse, and domestic violence but states traumatic experience during childhood related to My Dad was an alcoholic and was in and out of jail, and left us  when I was in the 5th grade.  At 77 or 59 (y/o) my Mom left and my sister and brother raised me until I went to live with my Grandma (maternal) for a little bit until I moved in with my aunt and uncle who were a little more stable. Violence:  Denies a history of violence Substance abuse:  Denies a history of illicit drug use including marijuana.  Denies alcohol use.  Reports she does smoke cigarettes 1/2 pK a day.  Past psychotropic medication trials:  Lexapro   Previous Psychotropic Medications: Yes   Substance Abuse History in the last 12 months:  No.  Consequences of Substance Abuse: NA  Past Medical History:  Past Medical History:  Diagnosis Date   Abnormal uterine bleeding (AUB)    Anxiety    Arthritis    oa   Depression 5 years ago   GERD (gastroesophageal reflux disease)    History of chest pain (10-20-2019  pt denies any cardiac s&s)   mutliple  ED visits, atypical chest pain, chest wall pain, muscularskeletal chest pain;  pt had cardiology evaulation dated 04-23-2018 note in epic , state atypical chest wall pain with negative d dimer and troponin's multiple times in epic, suggested CTA  (pt did not get done)   Hyperlipidemia    Hypertension    IDA (iron deficiency anemia)    none recently   Menorrhagia    Migraine    OSA on CPAP    followed by dr ather cpap set on 7 to 14   Osteoporosis    Psoriasis    Sleep apnea    Urinary frequency     Past Surgical History:  Procedure Laterality Date   ABDOMINAL HYSTERECTOMY     CHOLECYSTECTOMY N/A 10/25/2021   Procedure: LAPAROSCOPIC CHOLECYSTECTOMY with INTRAOPERATIVE CHOLANGIOGRAM;  Surgeon: Sheldon Standing,  MD;  Location: [facility];  Service: General;  Laterality: N/A;   DILATATION & CURETTAGE/HYSTEROSCOPY WITH MYOSURE N/A 10/24/2019   Procedure: DILATATION & CURETTAGE/HYSTEROSCOPY WITH MYOSURE;  Surgeon: Arnaldo Purchase, MD;  Location: [facility] [place];  Service: Gynecology;  Laterality: N/A;   HYSTEROSCOPY WITH NOVASURE N/A 04/06/2020   Procedure: DILATION AND CURRETTAGE; NOVASURE ABLATION;  Surgeon: Arnaldo Purchase, MD;  Location: [facility];  Service: Gynecology;  Laterality: N/A;   LAPAROSCOPY N/A 04/06/2020   Procedure: LAPAROSCOPY OPERATIVE; LYSIS OF ADHESION;  Surgeon: Arnaldo Purchase, MD;  Location: [facility] [place];  Service: Gynecology;  Laterality: N/A;   ROBOTIC ASSISTED TOTAL HYSTERECTOMY WITH BILATERAL SALPINGO OOPHERECTOMY Bilateral 09/28/2020   Procedure: XI ROBOTIC ASSISTED TOTAL LAPAROSCOPIC HYSTERECTOMY WITH BILATERAL SALPINGO OOPHORECTOMY;  Surgeon: Lavoie, Marie-Lyne, MD;  Location: [facility] ;  Service: Gynecology;  Laterality: Bilateral;  request 8:30am OR start time in IQUEUE held time for Dr. Lavoie requests 2 hours KATHY CONFIRMED ON 2/10 W/NICK FOR  TRACY (RNFA) TO ASSIST   TUBAL LIGATION Bilateral 02/05/2013   Procedure: POST PARTUM TUBAL LIGATION;  Surgeon: Lynwood KANDICE Solomons, MD;  Location: [facility];  Service: Gynecology;  Laterality: Bilateral;    Filshie clips    Family Psychiatric History: See below in family history  Family History:  Family History  Problem Relation Age of Onset   Anxiety disorder Mother    Kidney disease Mother    Alcohol abuse Father    Mental illness Father    Psoriasis Sister    Other Sister        pre-diabetic   Arthritis Sister    Depression Sister    Heart attack Brother 55       deceased from this   Anxiety disorder Brother    Hypertension Brother    Diabetes Maternal Grandmother    Cirrhosis Maternal Grandfather        alcohol related   Diabetes Paternal Grandmother    Allergy  (severe) Son        & nosebleeds   Colon cancer Neg Hx    Esophageal cancer Neg Hx    Colon polyps Neg Hx    Stomach cancer Neg Hx    Pancreatic cancer Neg Hx     Social History:   Social History   Socioeconomic History   Marital status: Legally Separated    Spouse name: Not on file   Number of children: Not on file   Years of education: Not on file   Highest education level: Not on file  Occupational History   Not on file  Tobacco Use   Smoking status: Every Day    Current  packs/day: 0.50    Average packs/day: 0.5 packs/day for 20.0 years (10.0 ttl pk-yrs)    Types: Cigarettes    Passive exposure: Never   Smokeless tobacco: Never  Vaping Use   Vaping status: Never Used  Substance and Sexual Activity   Alcohol use: No   Drug use: Never   Sexual activity: Yes    Birth control/protection: Surgical    Comment: Hyst  Other Topics Concern   Not on file  Social History Narrative   Works at [employer] in [employer]   Social Drivers of Health   Tobacco Use: High Risk (07/15/2024)   Patient History    Smoking Tobacco Use: Every Day    Smokeless Tobacco Use: Never    Passive Exposure: Never  Financial Resource Strain: Patient Declined (10/05/2022)   Overall Financial Resource Strain (CARDIA)    Difficulty of Paying Living Expenses: Patient declined  Food Insecurity: Unknown (07/14/2024)   Epic    Worried About Programme Researcher, Broadcasting/film/video in the Last Year: Never true    Ran Out of Food in the Last Year: Patient declined  Transportation Needs: Patient Declined (07/14/2024)   Epic    Lack of Transportation (Medical): Patient declined    Lack of Transportation (Non-Medical): Patient declined  Physical Activity: Sufficiently Active (10/05/2022)   Exercise Vital Sign    Days of Exercise per Week: 7 days    Minutes of Exercise per Session: 150+ min  Stress: No Stress Concern Present (10/05/2022)   [employer] of Occupational Health - Occupational Stress Questionnaire    Feeling of  Stress : Only a little  Social Connections: Unknown (10/05/2022)   Social Connection and Isolation Panel    Frequency of Communication with Friends and Family: Patient declined    Frequency of Social Gatherings with Friends and Family: Patient declined    Attends Religious Services: Patient declined    Active Member of Clubs or Organizations: Patient declined    Attends Banker Meetings: Not on file    Marital Status: Patient declined  Depression (PHQ2-9): High Risk (07/15/2024)   Depression (PHQ2-9)    PHQ-2 Score: 24  Alcohol Screen: Low Risk (07/15/2024)   Alcohol Screen    Last Alcohol Screening Score (AUDIT): 0  Housing: Unknown (07/14/2024)   Epic    Unable to Pay for Housing in the Last Year: Not on file    Number of Times Moved in the Last Year: Not on file    Homeless in the Last Year: Patient declined  Utilities: At Risk (07/14/2024)   Epic    Threatened with loss of utilities: Yes  Health Literacy: Not on file    Additional Social History: currently employed as scientist, research (life sciences).  She lives with her 6 y/o son, her mother, and her boyfriend (significant other and father of her son.  Allergies:  Allergies[1]  Metabolic Disorder Labs:  Most recent reviewed Lab Results  Component Value Date   HGBA1C 5.4 04/22/2024   MPG 108 04/22/2024   MPG 123 10/10/2023   No results found for: PROLACTIN Lab Results  Component Value Date   CHOL 124 10/10/2023   TRIG 291 (H) 10/10/2023   HDL 34 (L) 10/10/2023   CHOLHDL 3.6 10/10/2023   LDLCALC 56 10/10/2023   LDLCALC 112 (H) 08/23/2022   Lab Results  Component Value Date   TSH 3.29 04/22/2024    Current Medications: Current Outpatient Medications  Medication Sig Dispense Refill   famotidine  (PEPCID ) 20 MG tablet  Take 1 tablet (20 mg total) by mouth 2 (two) times daily. 30 tablet 0   hydrOXYzine  (VISTARIL ) 25 MG capsule Take 1 capsule (25 mg total) by mouth 3 (three) times daily as needed. 90 capsule 1    ketoconazole  (NIZORAL ) 2 % cream Apply 1 Application topically 2 (two) times daily under both breasts 60 g 3   lidocaine  (LIDODERM ) 5 % Place 1 patch onto the skin daily. Remove & Discard patch within 12 hours or as directed by MD 30 patch 0   metFORMIN  (GLUCOPHAGE -XR) 500 MG 24 hr tablet Take 1 tablet (500 mg total) by mouth 2 (two) times daily with a meal. 180 tablet 3   oxyCODONE  (OXY IR/ROXICODONE ) 5 MG immediate release tablet Take 1 tablet (5 mg total) by mouth every 8 (eight) hours as needed for severe pain (pain score 7-10). 90 tablet 0   pantoprazole  (PROTONIX ) 20 MG tablet Take 1 tablet (20 mg total) by mouth daily. 30 tablet 0   valsartan  (DIOVAN ) 80 MG tablet Take 2 tablets (160 mg total) by mouth daily. 180 tablet 1   amitriptyline  (ELAVIL ) 25 MG tablet Take 1 tablet (25 mg total) by mouth at bedtime. 30 tablet 1   busPIRone  (BUSPAR ) 10 MG tablet Take 0.5 tablets (5 mg total) by mouth 3 (three) times daily. 90 tablet 1   DULoxetine  (CYMBALTA ) 30 MG capsule Take 1 capsule (30 mg total) by mouth 2 (two) times daily. 60 capsule 1   No current facility-administered medications for this visit.    Musculoskeletal: Strength & Muscle Tone: Unable to assess via virtual video visit Gait & Station: Unable to assess via virtual video visit Patient leans: N/A  Psychiatric Specialty Exam: Review of Systems  Constitutional:  Positive for appetite change (Related to GI issues.  PCP aware and has been referred to GI).  Psychiatric/Behavioral:  Positive for agitation, dysphoric mood and sleep disturbance. Negative for hallucinations and self-injury. Suicidal ideas: Reports there have been times of passive suicidal thoughts with no intent or plan.  States she has her 36 y/o son and woud never kill her self.The patient is nervous/anxious.     Last menstrual period 09/01/2020.There is no height or weight on file to calculate BMI.  General Appearance: Casual  Eye Contact:  Good  Speech:  Clear and  Coherent and Normal Rate  Volume:  Normal  Mood:  Anxious and Depressed  Affect:  Congruent  Thought Process:  Coherent, Goal Directed, and Descriptions of Associations: Intact  Orientation:  Full (Time, Place, and Person)  Thought Content:  WDL and Logical  Suicidal Thoughts:  No  Homicidal Thoughts:  No  Memory:  Immediate;   Good Recent;   Good Remote;   Good  Judgement:  Intact  Insight:  Present  Psychomotor Activity:  Normal  Concentration:  Concentration: Good and Attention Span: Good  Recall:  Good  Fund of Knowledge:Good  Language: Good  Akathisia:  No  Handed:  Right  AIMS (if indicated):  done  Assets:  Communication Skills Desire for Improvement Financial Resources/Insurance Housing Resilience Social Support Transportation  ADL's:  Intact  Cognition: WNL  Sleep:  Fair   Screenings: Geneticist, Molecular Office Visit from 07/15/2024 in [facility]  AIMS Total Score 0   GAD-7    Flowsheet Row Office Visit from 07/15/2024 in [facility] Office Visit from 06/24/2024 in [facility] Office Visit from  05/05/2024 in [facility] Office Visit from 04/28/2024 in [facility] Office Visit from 04/22/2024 in [facility]  Total GAD-7 Score 20 21 20 21 20    PHQ2-9    Flowsheet Row Office Visit from 07/15/2024 in [facility] Office Visit from 06/24/2024 in [facility] Office Visit from 05/05/2024 in [facility] Office Visit from 04/28/2024 in [facility] Office Visit from 04/22/2024 in [facility]  PHQ-2 Total Score 6 6 6 6 6   PHQ-9 Total Score 24 27 23 24 24    Flowsheet Row Office Visit from 07/15/2024 in [facility] at [facility] ED from 07/14/2024 in [facility] Department at [facility] ED from 04/21/2024 in [facility] Emergency Department at [facility]  C-SSRS RISK CATEGORY No Risk No Risk No Risk    Assessment and Plan:  Assessment: Summary of today's assessment: Theresa Sawyer reported referred by PCP for medication management.  Reported worsening depression, anxiety, mood and sleep with no improvement with current medication regimen.  Denied adverse reactions to medications.  Reported decreased appetite related to GI issues and has been referred to Gi.  Not sleeping well (hard to stay).  On/off episodes of passive suicidal ideation with no intent or plan.  No prior suicide attempt, contracts for safety and states she has her 81 y/o son to live for. No guns in the home.  Today she denied suicidal/self-harm/homicidal ideation, psychosis, paranoia, and abnormal movement.   During visit she was dressed appropriate for age and weather.  She was seated comfortably in view of camera with no noted distress.  She was alert/oriented x 4, calm/cooperative and mood congruent with affect.  She spoke in a clear tone at moderate volume, and normal pace, with good eye contact.  Her thought process was coherent, relevant, and there was no indication that she was responding to internal/external stimuli or experiencing delusional thought content.  1. Severe episode of recurrent major depressive disorder, without psychotic features (HCC) (Primary) - busPIRone  (BUSPAR ) 10 MG tablet; Take 0.5 tablets (5 mg total) by mouth 3 (three) times daily.  Dispense: 90 tablet; Refill: 1 - DULoxetine  (CYMBALTA ) 30 MG capsule; Take 1 capsule (30 mg total) by mouth 2 (two) times daily.  Dispense: 60 capsule; Refill: 1  2. Insomnia, unspecified type - amitriptyline  (ELAVIL ) 25 MG tablet; Take 1 tablet (25 mg total) by mouth at bedtime.  Dispense: 30 tablet; Refill: 1 - hydrOXYzine  (VISTARIL ) 25 MG  capsule; Take 1 capsule (25 mg total) by mouth 3 (three) times daily as needed.  Dispense: 90 capsule; Refill: 1  3. GAD (generalized anxiety disorder) - busPIRone  (BUSPAR ) 10 MG tablet; Take 0.5 tablets (5 mg total) by mouth 3 (three) times daily.  Dispense: 90 tablet; Refill: 1 - DULoxetine  (CYMBALTA ) 30 MG capsule; Take 1 capsule (30 mg total) by mouth 2 (two) times daily.  Dispense: 60 capsule; Refill: 1 - hydrOXYzine  (VISTARIL ) 25 MG capsule; Take 1 capsule (25 mg total) by mouth 3 (three) times daily as needed.  Dispense: 90 capsule; Refill: 1       Plan: Medication management: Meds ordered this encounter  Medications   busPIRone  (BUSPAR ) 10 MG tablet    Sig: Take 0.5 tablets (5 mg total) by mouth 3 (three) times daily.    Dispense:  90 tablet  Refill:  1    Supervising Provider:   CURRY PATERSON T [[id number]]   DULoxetine  (CYMBALTA ) 30 MG capsule    Sig: Take 1 capsule (30 mg total) by mouth 2 (two) times daily.    Dispense:  60 capsule    Refill:  1    Supervising Provider:   ARFEEN, SYED T [[id number]]   amitriptyline  (ELAVIL ) 25 MG tablet    Sig: Take 1 tablet (25 mg total) by mouth at bedtime.    Dispense:  30 tablet    Refill:  1    Supervising Provider:   ARFEEN, SYED T [[id number]]   hydrOXYzine  (VISTARIL ) 25 MG capsule    Sig: Take 1 capsule (25 mg total) by mouth 3 (three) times daily as needed.    Dispense:  90 capsule    Refill:  1    Supervising Provider:   ARFEEN, SYED T [[id number]]   Medications Discontinued During This Encounter  Medication Reason   DULoxetine  (CYMBALTA ) 30 MG capsule Reorder   amitriptyline  (ELAVIL ) 25 MG tablet Reorder   busPIRone  (BUSPAR ) 5 MG tablet Reorder    Labs:  Most recent labs reviewed.  Lab orders not indicated at this time.     Other:  Counseling/Therapy:  Referral made awaiting appointment date and time.     ANNITTA FIFIELD was instructed to call [phone], mobile crisis, or present to the nearest emergency room should she experiences any  suicidal/homicidal ideation, auditory/visual/hallucinations, or detrimental worsening of her mental health condition.   Theresa Sawyer participated in the development of this treatment plan and verbalized her understanding/agreement with plan as listed.   Follow Up: Return in 1 month for medication management Call in the interim for any side-effects, decompensation, questions, or problems  Collaboration of Care: Medication Management AEB medication assessment, adjustment, refills, and started Vistaril  and Referral or follow-up with counselor/therapist AEB Referral to counseling/therapy awaiting appointment date and time  Patient/Guardian was advised Release of Information must be obtained prior to any record release in order to collaborate their care with an outside provider. Patient/Guardian was advised if they have not already done so to contact the registration department to sign all necessary forms in order for us  to release information regarding their care.   Consent: Patient/Guardian gives verbal consent for treatment and assignment of benefits for services provided during this visit. Patient/Guardian expressed understanding and agreed to proceed.   Marchello Rothgeb, NP 1/6/20261:42 PM     [1]  Allergies Allergen Reactions   Ibuprofen      Elevated LFTs and upset stomach    Tylenol  [Acetaminophen ]     Elevated LFTs and upset stomach    "

## 2024-07-15 NOTE — Progress Notes (Signed)
 "  Acute Office Visit  Patient ID: Theresa Sawyer, female    DOB: 30-Nov-1980, 44 y.o.   MRN: [id number]  PCP: Kayla Theresa RAMAN, FNP  Chief Complaint  Patient presents with   Follow-up    3 week f/u  Review labs Needs new pain management referral old clinic closed down  Concerns about medication      Subjective:     HPI  Was seen in ED yesterday due to 5 days of abdominal pain. CT scan unremarkable, normal CXR, EKG, UA, negative troponins, stable CBC and CMP. Was scheduled to be seen at hematology oncology for leukocytosis and sent to ED for severe abdominal pain. Theresa Sawyer is followed by pain management at [facility] for chronic pain related to osteoarthritis in both knees, low back pain, psoriatic arthritis, and chronic pain syndrome. She is also followed by Rheumatology for her psoriatic arthritis. She did have a recent episode last year of shingles from which she has ongoing postherpetic neuralgia. Theresa Sawyer also suffers from depression for which she sees psychiatry.   Discussed the use of AI scribe software for clinical note transcription with the patient, who gave verbal consent to proceed.  History of Present Illness Theresa Sawyer is a 44 year old female with osteoarthritis and autoimmune disease who presents for pain management and knee injections.  She is seeking a new provider for pain management and knee injections after her previous pain clinic in [place] closed. The clinic in [place] does not accept her insurance, leading her to self-pay for treatments. She requires knee injections every three to six months but has not had them for five to six months due to cost.  She is currently taking oxycodone  10 mg up to three times a day as needed for pain but prefers to reduce the dose to 5 mg without acetaminophen  due to liver concerns. She has a history of liver issues and is not supposed to take acetaminophen  or NSAIDs. She has previously been on a higher dose but requested  to reduce it. She refuses to take morphine  pills despite being in pain.  Her pain is widespread, affecting her knees, feet, and hands, and is associated with her autoimmune disease. She has been diagnosed with osteoarthritis and plantar fasciitis. She experiences severe pain on her left side and reports that a CT scan noted an accessory splenial; she wonders if this could be related to her pain. She also reports abdominal pain and has been referred to gastroenterology for further evaluation but has not been able to secure an appointment since December due to cancellations.  She has a history of shingles, which has left her with residual pain and a rash. She has tried various treatments, including antiviral medications and creams, but the rash persists.  Her current medications include amitriptyline  25 mg, Buspar  5 mg twice a day, Cymbalta  30 mg, Pepcid , lidocaine  patches, metformin , Protonix , and valsartan . She uses the [facility] [employer] for her prescriptions.  She reports high levels of anxiety, exacerbated by her mother's recent stroke and the need to arrange care for her. She is scheduled to see psychiatry today and has been experiencing significant stress and anxiety related to her personal and family health issues.  She has a history of elevated liver enzymes and fatty liver, which have shown improvement. Her white and red blood cell counts have been high. She is awaiting approval for Tremfya, a medication for her autoimmune disease, but has faced insurance delays.  She  reports weight loss and decreased appetite, which she attributes to pain. She experiences variable bowel movements, sometimes with diarrhea and constipation, and notes that the pain can be severe enough to bring her to her knees. She reports variable bowel movements, sometimes with diarrhea and constipation, but no nausea or vomiting.   Review of Systems  All other systems reviewed and are negative.   Past Medical  History:  Diagnosis Date   Abnormal uterine bleeding (AUB)    Anxiety    Arthritis    oa   Depression 5 years ago   GERD (gastroesophageal reflux disease)    History of chest pain (10-20-2019  pt denies any cardiac s&s)   mutliple ED visits, atypical chest pain, chest wall pain, muscularskeletal chest pain;  pt had cardiology evaulation dated 04-23-2018 note in epic , state atypical chest wall pain with negative d dimer and troponin's multiple times in epic, suggested CTA  (pt did not get done)   Hyperlipidemia    Hypertension    IDA (iron deficiency anemia)    none recently   Menorrhagia    Migraine    OSA on CPAP    followed by dr ather cpap set on 7 to 14   Osteoporosis    Psoriasis    Sleep apnea    Urinary frequency     Past Surgical History:  Procedure Laterality Date   ABDOMINAL HYSTERECTOMY     CHOLECYSTECTOMY N/A 10/25/2021   Procedure: LAPAROSCOPIC CHOLECYSTECTOMY with INTRAOPERATIVE CHOLANGIOGRAM;  Surgeon: Sheldon Standing, MD;  Location: [facility];  Service: General;  Laterality: N/A;   DILATATION & CURETTAGE/HYSTEROSCOPY WITH MYOSURE N/A 10/24/2019   Procedure: DILATATION & CURETTAGE/HYSTEROSCOPY WITH MYOSURE;  Surgeon: Arnaldo Purchase, MD;  Location: [facility];  Service: Gynecology;  Laterality: N/A;   HYSTEROSCOPY WITH NOVASURE N/A 04/06/2020   Procedure: DILATION AND CURRETTAGE; NOVASURE ABLATION;  Surgeon: Arnaldo Purchase, MD;  Location: [facility];  Service: Gynecology;  Laterality: N/A;   LAPAROSCOPY N/A 04/06/2020   Procedure: LAPAROSCOPY OPERATIVE; LYSIS OF ADHESION;  Surgeon: Arnaldo Purchase, MD;  Location: [facility];  Service: Gynecology;  Laterality: N/A;   ROBOTIC ASSISTED TOTAL HYSTERECTOMY WITH BILATERAL SALPINGO OOPHERECTOMY Bilateral 09/28/2020   Procedure: XI ROBOTIC ASSISTED TOTAL LAPAROSCOPIC HYSTERECTOMY WITH BILATERAL SALPINGO OOPHORECTOMY;  Surgeon: Lavoie, Marie-Lyne, MD;  [facility];  Service: Gynecology;  Laterality: Bilateral;  request 8:30am OR start time in IQUEUE held time for Dr. Lavoie requests 2 hours KATHY CONFIRMED ON 2/10 W/NICK FOR  TRACY (RNFA) TO ASSIST   TUBAL LIGATION Bilateral 02/05/2013   Procedure: POST PARTUM TUBAL LIGATION;  Surgeon: Lynwood KANDICE Solomons, MD;  Location: [facility];  Service: Gynecology;  Laterality: Bilateral;    Filshie clips    Outpatient Medications Prior to Visit  Medication Sig Dispense Refill   amitriptyline  (ELAVIL ) 25 MG tablet Take 1 tablet (25 mg total) by mouth at bedtime. 90 tablet 0   busPIRone  (BUSPAR ) 5 MG tablet Take 1 tablet (5 mg total) by mouth 2 (two) times daily. 60 tablet 0   DULoxetine  (CYMBALTA ) 30 MG capsule Take 1 capsule (30 mg total) by mouth daily. 90 capsule 0   famotidine  (PEPCID ) 20 MG tablet Take 1 tablet (20 mg total) by mouth 2 (two) times daily. 30 tablet 0   lidocaine  (LIDODERM ) 5 % Place 1 patch onto the skin daily. Remove & Discard patch within 12 hours or as directed by MD 30 patch 0   metFORMIN  (GLUCOPHAGE -XR)  500 MG 24 hr tablet Take 1 tablet (500 mg total) by mouth 2 (two) times daily with a meal. 180 tablet 3   oxyCODONE -acetaminophen  (PERCOCET) 10-325 MG tablet Take 1 tablet by mouth every 8 (eight) hours as needed for pain.     pantoprazole  (PROTONIX ) 20 MG tablet Take 1 tablet (20 mg total) by mouth daily. 30 tablet 0   valsartan  (DIOVAN ) 80 MG tablet TAKE 1 AND 1/2 TABLETS BY MOUTH DAILY 135 tablet 0   methocarbamol  (ROBAXIN ) 500 MG tablet Take 1 tablet (500 mg total) by mouth every 8 (eight) hours as needed for up to 10 days for muscle spasms. (Patient not taking: Reported on 07/15/2024) 20 tablet 0   predniSONE  (DELTASONE ) 5 MG tablet Take 4 tabs po x 4 days, 3  tabs po x 4 days, 2  tabs po x 4 days, 1  tab po x 4 days. Take in the morning with breakfast. Do not take any NSAIDS. (Patient not taking: Reported on 07/15/2024) 40 tablet 0   Roflumilast  (ZORYVE ) 0.3 % CREA Apply 1 Application  topically daily. (Patient not taking: Reported on 07/15/2024) 60 g 1   No facility-administered medications prior to visit.    Allergies[1]     Objective:    BP 137/87   Pulse 90   Ht 5' 6 (1.676 m)   Wt 210 lb 12.8 oz (95.6 kg)   LMP 09/01/2020   SpO2 98%   BMI 34.02 kg/m  BP Readings from Last 3 Encounters:  07/15/24 137/87  07/14/24 (!) 148/80  06/24/24 (!) 138/90   Wt Readings from Last 3 Encounters:  07/15/24 210 lb 12.8 oz (95.6 kg)  06/24/24 216 lb (98 kg)  05/05/24 219 lb 6.4 oz (99.5 kg)      Physical Exam Vitals and nursing note reviewed.  Constitutional:      Appearance: Normal appearance. She is obese.  HENT:     Head: Normocephalic and atraumatic.  Cardiovascular:     Rate and Rhythm: Normal rate and regular rhythm.     Pulses: Normal pulses.     Heart sounds: Normal heart sounds.  Pulmonary:     Effort: Pulmonary effort is normal.     Breath sounds: Normal breath sounds.  Abdominal:     General: Bowel sounds are normal.     Palpations: Abdomen is soft.     Tenderness: There is abdominal tenderness in the left upper quadrant and left lower quadrant.  Skin:    General: Skin is warm and dry.     Findings: Rash present.      Neurological:     General: No focal deficit present.     Mental Status: She is alert and oriented to person, place, and time. Mental status is at baseline.  Psychiatric:        Mood and Affect: Mood normal.        Behavior: Behavior normal.        Thought Content: Thought content normal.        Judgment: Judgment normal.       No results found for any visits on 07/15/24.     Assessment & Plan:   Problem List Items Addressed This Visit   None   Assessment and Plan Assessment & Plan Chronic pain syndrome with osteoarthritis and postherpetic neuralgia Chronic pain from osteoarthritis and postherpetic neuralgia, complicated by liver issues limiting medication options. Prefers oxycodone  5 mg without acetaminophen  due  to gastrointestinal upset. Pending orthopedic referral for knee  replacement. - Prescribed oxycodone  5 mg without acetaminophen , up to three times daily. - Referred to orthopedics for knee replacement evaluation. - Schedule knee injections at next visit. - Plan to reduce oxycodone  by 10% monthly until pain management referral is established.  Chronic low back pain - Continue current pain management regimen.  Essential hypertension Hypertension managed with valsartan . Home readings show elevated blood pressure, possibly due to pain and autoimmune conditions. - Monitor blood pressure at home and bring readings to next visit. - Increase Valsartan  to 160 mg daily.  Abdominal pain with accessory spleen and abnormal liver enzymes Chronic abdominal pain with accessory spleen. Liver enzymes improved. Missed gastroenterology appointment due to weather. [facility] gastro[facility]ology and oncology to expedite rescheduling appointments. - Continue to monitor liver function tests.  Psoriasis - Contacted Rheumatology pharmacist regarding Tremfya, patient needs to complete her portion of the paperwork and return.  Intertrigo - Start Ketoconazole  cream BID under both breasts for 2-4 weeks.   Depression and anxiety Exacerbated by family stressors and chronic pain. High anxiety and depression scores. Missed psychiatry appointment due to family obligations. Expresses feelings of being overwhelmed. - Encouraged attendance at psychiatry appointment today. - Recommended counseling or therapy for additional support. - If suicidal ideations should occur seek immediate help at behavioral health urgent care or call [phone].  I personally spent a total of 45 minutes in the care of the patient today including preparing to see the patient, getting/reviewing separately obtained history, performing a medically appropriate exam/evaluation, counseling and educating, placing orders, referring and communicating with other  health care professionals, documenting clinical information in the EHR, independently interpreting results, and communicating results.   No orders of the defined types were placed in this encounter.   Return in about 3 months (around 10/13/2024) for annual physical with labs 1 week prior.  Theresa GORMAN Barrio, FNP [facility]      [1]  Allergies Allergen Reactions   Ibuprofen      Elevated LFTs and upset stomach    Tylenol  [Acetaminophen ]     Elevated LFTs and upset stomach    "

## 2024-07-15 NOTE — Telephone Encounter (Signed)
 Copied from CRM [id number]. Topic: Clinical - Medical Advice >> Jul 15, 2024 12:05 PM Montie POUR wrote: Reason for CRM:  Message if for Jesusa at [facility]: Ilene called from Dr. Autumn office and said that Ms. Agrawal did come to her her Oncology appointment on 07/14/24 and she came in 2 hours early. Ms. Gotschall told front desk that she was having chest pains and Dr. Autumn office sent her to the emergency room (notes are in her chart). Ilene from Oncology did make her appointment for next week (07/22/24 at 11:45 AM) and she cannot get in touch with Ms. Arman (her voicemail full). Please call Ilene back if needed at [phone].

## 2024-07-15 NOTE — Telephone Encounter (Signed)
 Application has been sent once again to the email address provided. Will await it's completion and return.

## 2024-07-15 NOTE — Telephone Encounter (Signed)
 Called PT to reschedule NEW HEM Appt; mailbox is full.

## 2024-07-15 NOTE — Telephone Encounter (Signed)
 Sheena called from the patients PCP office to inquire about the status of the patients referral. The primary provider indicated that the patient needs to be seen. The nurse was informed that she would need to contact [facility], and that the patient will need to answer their calls or return their calls to proceed with scheduling.

## 2024-07-15 NOTE — Telephone Encounter (Signed)
 Patient at appt with Family Medicine today and requested Tremfya form be sent again via [employer] Email: [email or web address]

## 2024-07-15 NOTE — Telephone Encounter (Signed)
 Patient was rescheduled for her missed appointment from yesterday. Patient came yesterday for her appointment but reported having chest pain. Dr. Autumn advised patient to go to the ED immediately. ED notes in chart from visit. Upon calling patient to inform her of her new appointment, it was discovered that her voicemail was full. Primary Care Physician's Office, [facility] was informed of new appointment for 07/22/24 at 11:00 am. Patient's PCP is a [facility] and has access to chart.

## 2024-07-16 ENCOUNTER — Other Ambulatory Visit (HOSPITAL_BASED_OUTPATIENT_CLINIC_OR_DEPARTMENT_OTHER): Payer: Self-pay

## 2024-07-16 ENCOUNTER — Telehealth: Payer: Self-pay

## 2024-07-16 NOTE — Telephone Encounter (Signed)
 Followed up with patient regarding her rescheduled appointments with Dr. Autumn at [facility]. Patient stated that she is aware of her new appointment on Tuesday, January 13th at 1100. Patient agreed to attend.

## 2024-07-17 NOTE — Telephone Encounter (Signed)
 Was able to reach Theresa Sawyer back to inform her that [name] had been contacted. [name]  did confirm her appt. And is aware of dates and times

## 2024-07-21 ENCOUNTER — Other Ambulatory Visit: Payer: Self-pay | Admitting: Oncology

## 2024-07-21 ENCOUNTER — Telehealth: Payer: Self-pay | Admitting: Oncology

## 2024-07-21 DIAGNOSIS — D72829 Elevated white blood cell count, unspecified: Secondary | ICD-10-CM

## 2024-07-21 NOTE — Telephone Encounter (Signed)
 PT called to reschedule appt, has to sit with her Mom due to her just having a stroke and needed the afternoon time.

## 2024-07-22 ENCOUNTER — Encounter: Payer: Self-pay | Admitting: Oncology

## 2024-07-22 ENCOUNTER — Inpatient Hospital Stay: Admitting: Oncology

## 2024-07-22 ENCOUNTER — Inpatient Hospital Stay

## 2024-07-22 ENCOUNTER — Inpatient Hospital Stay: Attending: Oncology | Admitting: Oncology

## 2024-07-22 VITALS — BP 121/76 | HR 87 | Temp 98.1°F | Resp 15 | Ht 66.0 in | Wt 216.4 lb

## 2024-07-22 DIAGNOSIS — F17210 Nicotine dependence, cigarettes, uncomplicated: Secondary | ICD-10-CM | POA: Diagnosis not present

## 2024-07-22 DIAGNOSIS — M19041 Primary osteoarthritis, right hand: Secondary | ICD-10-CM | POA: Diagnosis not present

## 2024-07-22 DIAGNOSIS — L4050 Arthropathic psoriasis, unspecified: Secondary | ICD-10-CM | POA: Diagnosis not present

## 2024-07-22 DIAGNOSIS — G4733 Obstructive sleep apnea (adult) (pediatric): Secondary | ICD-10-CM | POA: Diagnosis not present

## 2024-07-22 DIAGNOSIS — D72829 Elevated white blood cell count, unspecified: Secondary | ICD-10-CM

## 2024-07-22 DIAGNOSIS — M810 Age-related osteoporosis without current pathological fracture: Secondary | ICD-10-CM | POA: Diagnosis not present

## 2024-07-22 DIAGNOSIS — Z79899 Other long term (current) drug therapy: Secondary | ICD-10-CM | POA: Diagnosis not present

## 2024-07-22 DIAGNOSIS — M170 Bilateral primary osteoarthritis of knee: Secondary | ICD-10-CM | POA: Diagnosis not present

## 2024-07-22 DIAGNOSIS — D751 Secondary polycythemia: Secondary | ICD-10-CM | POA: Insufficient documentation

## 2024-07-22 DIAGNOSIS — M19042 Primary osteoarthritis, left hand: Secondary | ICD-10-CM | POA: Diagnosis not present

## 2024-07-22 DIAGNOSIS — Z79620 Long term (current) use of immunosuppressive biologic: Secondary | ICD-10-CM | POA: Diagnosis not present

## 2024-07-22 LAB — CBC WITH DIFFERENTIAL (CANCER CENTER ONLY)
Abs Immature Granulocytes: 0.03 K/uL (ref 0.00–0.07)
Basophils Absolute: 0.1 K/uL (ref 0.0–0.1)
Basophils Relative: 1 %
Eosinophils Absolute: 0.5 K/uL (ref 0.0–0.5)
Eosinophils Relative: 4 %
HCT: 46.2 % — ABNORMAL HIGH (ref 36.0–46.0)
Hemoglobin: 15.4 g/dL — ABNORMAL HIGH (ref 12.0–15.0)
Immature Granulocytes: 0 %
Lymphocytes Relative: 42 %
Lymphs Abs: 5.4 K/uL — ABNORMAL HIGH (ref 0.7–4.0)
MCH: 29.6 pg (ref 26.0–34.0)
MCHC: 33.3 g/dL (ref 30.0–36.0)
MCV: 88.8 fL (ref 80.0–100.0)
Monocytes Absolute: 1 K/uL (ref 0.1–1.0)
Monocytes Relative: 8 %
Neutro Abs: 5.8 K/uL (ref 1.7–7.7)
Neutrophils Relative %: 45 %
Platelet Count: 366 K/uL (ref 150–400)
RBC: 5.2 MIL/uL — ABNORMAL HIGH (ref 3.87–5.11)
RDW: 13.1 % (ref 11.5–15.5)
WBC Count: 12.8 K/uL — ABNORMAL HIGH (ref 4.0–10.5)
nRBC: 0 % (ref 0.0–0.2)

## 2024-07-22 LAB — CMP (CANCER CENTER ONLY)
ALT: 32 U/L (ref 0–44)
AST: 26 U/L (ref 15–41)
Albumin: 4.7 g/dL (ref 3.5–5.0)
Alkaline Phosphatase: 117 U/L (ref 38–126)
Anion gap: 11 (ref 5–15)
BUN: 7 mg/dL (ref 6–20)
CO2: 27 mmol/L (ref 22–32)
Calcium: 10.2 mg/dL (ref 8.9–10.3)
Chloride: 100 mmol/L (ref 98–111)
Creatinine: 0.84 mg/dL (ref 0.44–1.00)
GFR, Estimated: >60 mL/min
Glucose, Bld: 88 mg/dL (ref 70–99)
Potassium: 3.8 mmol/L (ref 3.5–5.1)
Sodium: 138 mmol/L (ref 135–145)
Total Bilirubin: 0.4 mg/dL (ref 0.0–1.2)
Total Protein: 7.7 g/dL (ref 6.5–8.1)

## 2024-07-22 LAB — LACTATE DEHYDROGENASE: LDH: 183 U/L (ref 105–235)

## 2024-07-22 LAB — C-REACTIVE PROTEIN: CRP: <0.5 mg/dL

## 2024-07-22 LAB — TSH: TSH: 1.45 u[IU]/mL (ref 0.350–4.500)

## 2024-07-22 LAB — SEDIMENTATION RATE: Sed Rate: 2 mm/h (ref 0–22)

## 2024-07-22 NOTE — Assessment & Plan Note (Addendum)
 She has persistent mild to moderate leukocytosis and elevated hemoglobin/hematocrit, with stable platelet counts and no evidence of acute progression.   The findings are consistent with a reactive etiology, likely secondary to chronic hypoxia from smoking and chronic inflammation from autoimmune disease, rather than a primary bone marrow disorder.   Malignancy is unlikely given the chronicity and stability.  Labs today showed stable white count of 12,800 with ANC of 5800, ALC of 5400, overall normal differential.  Hemoglobin stable at 15.4, hematocrit 46.2.  Platelet count normal at 266,000.  CMP, LDH, TSH unremarkable.  Given chronicity of leukocytosis, we proceeded with BCR/ABL 1 testing and flow cytometry of peripheral blood to rule out any primary myeloproliferative neoplasm, although suspicion is low clinically.  - Planned to review laboratory results and discuss findings by phone in two weeks. - Scheduled follow-up in six months with repeat laboratory studies. - Provided education regarding the impact of smoking on hematologic parameters and the increased risk of thromboembolic events, stroke, and cardiovascular complications.

## 2024-07-22 NOTE — Assessment & Plan Note (Signed)
 Chronic, mild polycythemia, likely secondary to cigarette smoking.  Picture not indicative of primary polycythemia.  We will defer JAK2 testing at this time.

## 2024-07-22 NOTE — Progress Notes (Signed)
 "  [facility]  HEMATOLOGY CLINIC CONSULTATION NOTE    PATIENT NAME: Theresa Sawyer   MR#: [id number] DOB: January 25, 1981  DATE OF SERVICE: 07/22/2024  REFERRING PROVIDER:  Kayla Jeoffrey RAMAN, FNP   Patient Care Team: Kayla Jeoffrey RAMAN, FNP as PCP - General (Family Medicine)  REASON FOR CONSULTATION/ CHIEF COMPLAINT:  Evaluation of leukocytosis.   ASSESSMENT & PLAN:  Theresa Sawyer is a 44 y.o. lady with a past medical history of hypertension, dyslipidemia, osteoporosis, anxiety/depression, GERD, psoriatic arthritis, tobacco smoking, was referred to our service for evaluation of leukocytosis.    Leukocytosis She has persistent mild to moderate leukocytosis and elevated hemoglobin/hematocrit, with stable platelet counts and no evidence of acute progression.   The findings are consistent with a reactive etiology, likely secondary to chronic hypoxia from smoking and chronic inflammation from autoimmune disease, rather than a primary bone marrow disorder.   Malignancy is unlikely given the chronicity and stability.  Labs today showed stable white count of 12,800 with ANC of 5800, ALC of 5400, overall normal differential.  Hemoglobin stable at 15.4, hematocrit 46.2.  Platelet count normal at 266,000.  CMP, LDH, TSH unremarkable.  Given chronicity of leukocytosis, we proceeded with BCR/ABL 1 testing and flow cytometry of peripheral blood to rule out any primary myeloproliferative neoplasm, although suspicion is low clinically.  - Planned to review laboratory results and discuss findings by phone in two weeks. - Scheduled follow-up in six months with repeat laboratory studies. - Provided education regarding the impact of smoking on hematologic parameters and the increased risk of thromboembolic events, stroke, and cardiovascular complications.  Polycythemia Chronic, mild polycythemia, likely secondary to cigarette smoking.  Picture not indicative of primary polycythemia.  We  will defer JAK2 testing at this time.  Nicotine  dependence Ongoing nicotine  use is contributing to secondary polycythemia and leukocytosis, increasing risk for hematologic and cardiovascular complications. - Advised reduction of cigarette consumption to less than half a pack per day or complete cessation, emphasizing associated health benefits and decreased risk of blood-related complications.  Psoriatic arthritis Chronic inflammatory disease contributing to leukocytosis.  Osteoporosis Osteoporosis at a relatively young age, possibly related to underlying autoimmune disease.  Obstructive sleep apnea Obstructive sleep apnea with compliance to CPAP therapy.  I reviewed lab results and outside records for this visit and discussed relevant results with the patient. Diagnosis, plan of care and treatment options were also discussed in detail with the patient. Opportunity provided to ask questions and answers provided to her apparent satisfaction. Provided instructions to call our clinic with any problems, questions or concerns prior to return visit. I recommended to continue follow-up with PCP and sub-specialists. She verbalized understanding and agreed with the plan. No barriers to learning was detected.  Chinita Patten, MD  07/22/2024 5:21 PM  [facility] DEPT OF [facility] [address] [place] [address] Dept: [phone] Dept Fax: [phone]   HISTORY OF PRESENTING ILLNESS:   Discussed the use of AI scribe software for clinical note transcription with the patient, who gave verbal consent to proceed.  History of Present Illness Theresa Sawyer is a 44 year old female with chronic leukocytosis and secondary polycythemia who presents for hematology evaluation.  She has experienced persistent leukocytosis for approximately thirteen years, with white blood cell counts ranging from 11,000 to a peak of 23,000 in  2014, and a recent value of 13,000 noted last week. Hemoglobin has been mildly elevated,  while platelet counts have remained within normal limits. She reports a recent episode of shingles and did not mention other recurrent infections.  She has psoriatic arthritis and osteoarthritis involving her feet, hands, and knees, with associated bone spurs and significant cartilage loss. A recent seven-day course of prednisone  did not provide symptomatic relief. She has osteoporosis and reports intermittent pain in the area of a small accessory spleen.  She smokes approximately half a pack of cigarettes per day, with prior periods of increased use and previous attempts to quit, including a trial of bupropion . She has obstructive sleep apnea and is compliant with CPAP therapy. She has not undergone a colonoscopy and is due for a mammogram.     MEDICAL HISTORY:  Past Medical History:  Diagnosis Date   Abnormal uterine bleeding (AUB)    Acute cholecystitis 10/24/2021   Anxiety    Arthritis    oa   Depression 5 years ago   GERD (gastroesophageal reflux disease)    History of chest pain (10-20-2019  pt denies any cardiac s&s)   mutliple ED visits, atypical chest pain, chest wall pain, muscularskeletal chest pain;  pt had cardiology evaulation dated 04-23-2018 note in epic , state atypical chest wall pain with negative d dimer and troponin's multiple times in epic, suggested CTA  (pt did not get done)   Hyperlipidemia    Hypertension    IDA (iron deficiency anemia)    none recently   Menorrhagia    Migraine    OSA on CPAP    followed by dr ather cpap set on 7 to 14   Osteoporosis    Psoriasis    Urinary frequency     SURGICAL HISTORY: Past Surgical History:  Procedure Laterality Date   ABDOMINAL HYSTERECTOMY     CHOLECYSTECTOMY N/A 10/25/2021   Procedure: LAPAROSCOPIC CHOLECYSTECTOMY with INTRAOPERATIVE CHOLANGIOGRAM;  Surgeon: Sheldon Standing, MD;  Location: [facility];  Service: General;   Laterality: N/A;   DILATATION & CURETTAGE/HYSTEROSCOPY WITH MYOSURE N/A 10/24/2019   Procedure: DILATATION & CURETTAGE/HYSTEROSCOPY WITH MYOSURE;  Surgeon: Arnaldo Purchase, MD;  Location: [facility];  Service: Gynecology;  Laterality: N/A;   HYSTEROSCOPY WITH NOVASURE N/A 04/06/2020   Procedure: DILATION AND CURRETTAGE; NOVASURE ABLATION;  Surgeon: Arnaldo Purchase, MD;  Location: [facility] [address];  Service: Gynecology;  Laterality: N/A;   LAPAROSCOPY N/A 04/06/2020   Procedure: LAPAROSCOPY OPERATIVE; LYSIS OF ADHESION;  Surgeon: Arnaldo Purchase, MD;  Location: [facility];  Service: Gynecology;  Laterality: N/A;   ROBOTIC ASSISTED TOTAL HYSTERECTOMY WITH BILATERAL SALPINGO OOPHERECTOMY Bilateral 09/28/2020   Procedure: XI ROBOTIC ASSISTED TOTAL LAPAROSCOPIC HYSTERECTOMY WITH BILATERAL SALPINGO OOPHORECTOMY;  Surgeon: Lavoie, Marie-Lyne, MD;  Location: [facility];  Service: Gynecology;  Laterality: Bilateral;  request 8:30am OR start time in IQUEUE held time for Dr. Lavoie requests 2 hours KATHY CONFIRMED ON 2/10 W/NICK FOR  TRACY (RNFA) TO ASSIST   TUBAL LIGATION Bilateral 02/05/2013   Procedure: POST PARTUM TUBAL LIGATION;  Surgeon: Lynwood KANDICE Solomons, MD;  Location: [facility];  Service: Gynecology;  Laterality: Bilateral;    Filshie clips    SOCIAL HISTORY: She reports that she has been smoking cigarettes. She has a 10 pack-year smoking history. She has never been exposed to tobacco smoke. She has never used smokeless tobacco. She reports that she does not drink alcohol and does not use drugs. Social History   Socioeconomic History   Marital status: Legally Separated    Spouse name: Not on file  Number of children: Not on file   Years of education: Not on file   Highest education level: Not on file  Occupational History   Not on file  Tobacco Use   Smoking status: Every Day    Current packs/day: 0.50    Average packs/day: 0.5  packs/day for 20.0 years (10.0 ttl pk-yrs)    Types: Cigarettes    Passive exposure: Never   Smokeless tobacco: Never  Vaping Use   Vaping status: Never Used  Substance and Sexual Activity   Alcohol use: No   Drug use: Never   Sexual activity: Yes    Birth control/protection: Surgical    Comment: Hyst  Other Topics Concern   Not on file  Social History Narrative   Works at [employer] in [employer]   Social Drivers of Health   Tobacco Use: High Risk (07/15/2024)   Patient History    Smoking Tobacco Use: Every Day    Smokeless Tobacco Use: Never    Passive Exposure: Never  Financial Resource Strain: Patient Declined (10/05/2022)   Overall Financial Resource Strain (CARDIA)    Difficulty of Paying Living Expenses: Patient declined  Food Insecurity: Food Insecurity Present (07/21/2024)   Epic    Worried About Programme Researcher, Broadcasting/film/video in the Last Year: Patient declined    Barista in the Last Year: Sometimes true  Transportation Needs: Unmet Transportation Needs (07/21/2024)   Epic    Lack of Transportation (Medical): Yes    Lack of Transportation (Non-Medical): Yes  Physical Activity: Sufficiently Active (10/05/2022)   Exercise Vital Sign    Days of Exercise per Week: 7 days    Minutes of Exercise per Session: 150+ min  Stress: No Stress Concern Present (10/05/2022)   [employer] of Occupational Health - Occupational Stress Questionnaire    Feeling of Stress : Only a little  Social Connections: Unknown (10/05/2022)   Social Connection and Isolation Panel    Frequency of Communication with Friends and Family: Patient declined    Frequency of Social Gatherings with Friends and Family: Patient declined    Attends Religious Services: Patient declined    Database Administrator or Organizations: Patient declined    Attends Banker Meetings: Not on file    Marital Status: Patient declined  Intimate Partner Violence: Not on file  Depression (PHQ2-9): High Risk  (07/22/2024)   Depression (PHQ2-9)    PHQ-2 Score: 22  Alcohol Screen: Low Risk (07/15/2024)   Alcohol Screen    Last Alcohol Screening Score (AUDIT): 0  Housing: Unknown (07/21/2024)   Epic    Unable to Pay for Housing in the Last Year: Patient declined    Number of Times Moved in the Last Year: 0    Homeless in the Last Year: No  Utilities: At Risk (07/21/2024)   Epic    Threatened with loss of utilities: Yes  Health Literacy: Not on file    FAMILY HISTORY: Family History  Problem Relation Age of Onset   Anxiety disorder Mother    Kidney disease Mother    Alcohol abuse Father    Mental illness Father    Psoriasis Sister    Other Sister        pre-diabetic   Arthritis Sister    Depression Sister    Heart attack Brother 64       deceased from this   Anxiety disorder Brother    Hypertension Brother    Diabetes Maternal Grandmother  Cirrhosis Maternal Grandfather        alcohol related   Diabetes Paternal Grandmother    Allergy (severe) Son        & nosebleeds   Colon cancer Neg Hx    Esophageal cancer Neg Hx    Colon polyps Neg Hx    Stomach cancer Neg Hx    Pancreatic cancer Neg Hx     ALLERGIES:  She is allergic to ibuprofen  and tylenol  [acetaminophen ].  MEDICATIONS:  Current Outpatient Medications  Medication Sig Dispense Refill   amitriptyline  (ELAVIL ) 25 MG tablet Take 1 tablet (25 mg total) by mouth at bedtime. 30 tablet 1   busPIRone  (BUSPAR ) 10 MG tablet Take 0.5 tablets (5 mg total) by mouth 3 (three) times daily. 90 tablet 1   DULoxetine  (CYMBALTA ) 30 MG capsule Take 1 capsule (30 mg total) by mouth 2 (two) times daily. 60 capsule 1   famotidine  (PEPCID ) 20 MG tablet Take 1 tablet (20 mg total) by mouth 2 (two) times daily. 30 tablet 0   hydrOXYzine  (VISTARIL ) 25 MG capsule Take 1 capsule (25 mg total) by mouth 3 (three) times daily as needed. 90 capsule 1   ketoconazole  (NIZORAL ) 2 % cream Apply 1 Application topically 2 (two) times daily under both  breasts 60 g 3   lidocaine  (LIDODERM ) 5 % Place 1 patch onto the skin daily. Remove & Discard patch within 12 hours or as directed by MD 30 patch 0   metFORMIN  (GLUCOPHAGE -XR) 500 MG 24 hr tablet Take 1 tablet (500 mg total) by mouth 2 (two) times daily with a meal. 180 tablet 3   oxyCODONE  (OXY IR/ROXICODONE ) 5 MG immediate release tablet Take 1 tablet (5 mg total) by mouth every 8 (eight) hours as needed for severe pain (pain score 7-10). 90 tablet 0   pantoprazole  (PROTONIX ) 20 MG tablet Take 1 tablet (20 mg total) by mouth daily. 30 tablet 0   valsartan  (DIOVAN ) 80 MG tablet Take 2 tablets (160 mg total) by mouth daily. 180 tablet 1   No current facility-administered medications for this visit.    REVIEW OF SYSTEMS:    Review of Systems - Oncology  All other pertinent systems were reviewed and were negative except as mentioned above.  PHYSICAL EXAMINATION:    Onc Performance Status - 07/22/24 1459       ECOG Perf Status   ECOG Perf Status Ambulatory and capable of all selfcare but unable to carry out any work activities.  Up and about more than 50% of waking hours      KPS SCALE   KPS % SCORE Cares for self, unable to carry on normal activity or to do active work          Vitals:   07/22/24 1453  BP: 121/76  Pulse: 87  Resp: 15  Temp: 98.1 F (36.7 C)  SpO2: 98%   Filed Weights   07/22/24 1453  Weight: 216 lb 6.4 oz (98.2 kg)    Physical Exam Constitutional:      General: She is not in acute distress.    Appearance: Normal appearance.  HENT:     Head: Normocephalic and atraumatic.  Cardiovascular:     Rate and Rhythm: Normal rate.     Heart sounds: Normal heart sounds.  Pulmonary:     Effort: Pulmonary effort is normal. No respiratory distress.     Breath sounds: Normal breath sounds.  Abdominal:     General: There is no distension.  Neurological:  General: No focal deficit present.     Mental Status: She is alert and oriented to person, place, and  time.  Psychiatric:        Mood and Affect: Mood normal.        Behavior: Behavior normal.      LABORATORY DATA:   I have reviewed the data as listed.  Results for orders placed or performed in visit on 07/22/24  TSH  Result Value Ref Range   TSH 1.450 0.350 - 4.500 uIU/mL  Lactate dehydrogenase  Result Value Ref Range   LDH 183 105 - 235 U/L  CMP (Cancer Center only)  Result Value Ref Range   Sodium 138 135 - 145 mmol/L   Potassium 3.8 3.5 - 5.1 mmol/L   Chloride 100 98 - 111 mmol/L   CO2 27 22 - 32 mmol/L   Glucose, Bld 88 70 - 99 mg/dL   BUN 7 6 - 20 mg/dL   Creatinine 9.15 9.55 - 1.00 mg/dL   Calcium  10.2 8.9 - 10.3 mg/dL   Total Protein 7.7 6.5 - 8.1 g/dL   Albumin 4.7 3.5 - 5.0 g/dL   AST 26 15 - 41 U/L   ALT 32 0 - 44 U/L   Alkaline Phosphatase 117 38 - 126 U/L   Total Bilirubin 0.4 0.0 - 1.2 mg/dL   GFR, Estimated >39 >39 mL/min   Anion gap 11 5 - 15  CBC with Differential (Cancer Center Only)  Result Value Ref Range   WBC Count 12.8 (H) 4.0 - 10.5 K/uL   RBC 5.20 (H) 3.87 - 5.11 MIL/uL   Hemoglobin 15.4 (H) 12.0 - 15.0 g/dL   HCT 53.7 (H) 63.9 - 53.9 %   MCV 88.8 80.0 - 100.0 fL   MCH 29.6 26.0 - 34.0 pg   MCHC 33.3 30.0 - 36.0 g/dL   RDW 86.8 88.4 - 84.4 %   Platelet Count 366 150 - 400 K/uL   nRBC 0.0 0.0 - 0.2 %   Neutrophils Relative % 45 %   Neutro Abs 5.8 1.7 - 7.7 K/uL   Lymphocytes Relative 42 %   Lymphs Abs 5.4 (H) 0.7 - 4.0 K/uL   Monocytes Relative 8 %   Monocytes Absolute 1.0 0.1 - 1.0 K/uL   Eosinophils Relative 4 %   Eosinophils Absolute 0.5 0.0 - 0.5 K/uL   Basophils Relative 1 %   Basophils Absolute 0.1 0.0 - 0.1 K/uL   Immature Granulocytes 0 %   Abs Immature Granulocytes 0.03 0.00 - 0.07 K/uL     RADIOGRAPHIC STUDIES:  I have personally reviewed the radiological images as listed and agree with the findings in the report.  CT ABDOMEN PELVIS W CONTRAST Result Date: 07/14/2024 CLINICAL DATA:  Left lower quadrant abdominal  pain EXAM: CT ABDOMEN AND PELVIS WITH CONTRAST TECHNIQUE: Multidetector CT imaging of the abdomen and pelvis was performed using the standard protocol following bolus administration of intravenous contrast. RADIATION DOSE REDUCTION: This exam was performed according to the departmental dose-optimization program which includes automated exposure control, adjustment of the mA and/or kV according to patient size and/or use of iterative reconstruction technique. CONTRAST:  OMNIPAQUE  IOHEXOL  300 MG/ML  SOLN COMPARISON:  10/01/2023 FINDINGS: Lower chest: No acute abnormality. Hepatobiliary: Interval improvement in the degree of hepatic steatosis compared to 10/01/2023. Focal fatty infiltration noted along the falciform ligament anteriorly. No large focal hepatic abnormality or biliary dilatation pattern. Hepatic and portal veins are patent. Remote cholecystectomy. Common bile duct nondilated.  Pancreas: Unremarkable. No pancreatic ductal dilatation or surrounding inflammatory changes. Spleen: Normal in size without focal abnormality. Adjacent accessory splenule again noted. Adrenals/Urinary Tract: Adrenal glands are unremarkable. Kidneys are normal, without renal calculi, focal lesion, or hydronephrosis. Bladder is unremarkable. Stomach/Bowel: Stomach is within normal limits. Appendix appears normal. No evidence of bowel wall thickening, distention, or inflammatory changes. Vascular/Lymphatic: Minimal aortic bifurcation atherosclerosis. No acute aortic process, aneurysm or dissection. Mesenteric and renal vasculature all patent. No veno-occlusive process. No bulky adenopathy. Reproductive: Status post hysterectomy. No adnexal masses. Other: No abdominal wall hernia or abnormality. No abdominopelvic ascites. Musculoskeletal: No acute or significant osseous findings. IMPRESSION: 1. No acute intra-abdominal or pelvic finding by CT. 2. Interval improvement in the degree of hepatic steatosis. 3. Remote cholecystectomy and  hysterectomy. Electronically Signed   By: CHRISTELLA.  Shick M.D.   On: 07/14/2024 12:27   DG Chest 2 View Result Date: 07/14/2024 CLINICAL DATA:  Chest pain radiating to back. EXAM: CHEST - 2 VIEW COMPARISON:  04/21/2024 FINDINGS: The heart size and mediastinal contours are within normal limits. Both lungs are clear. The visualized skeletal structures are unremarkable. IMPRESSION: No active cardiopulmonary disease. Electronically Signed   By: Norleen DELENA Kil M.D.   On: 07/14/2024 09:51    I spent a total of 55 minutes during this encounter with the patient including review of chart and various tests results, discussions about plan of care and coordination of care plan.  No orders of the defined types were placed in this encounter.   Future Appointments  Date Time Provider Department Center  07/24/2024  4:00 PM Grant Juliene RAMAN, [place] [facility] None  08/04/2024 12:45 PM Elizabeth Paulsen, Chinita, MD [facility] None  08/04/2024  3:45 PM Burnetta Brunet, DO [facility] None  08/11/2024 10:30 AM Arletta Camie FORBES DEVONNA [facility]  08/12/2024  2:45 PM Kayla Jeoffrey RAMAN, FNP [facility]  08/13/2024  4:30 PM Rankin, Luisa NOVAK, NP [facility] None  08/19/2024  9:40 AM Dolphus Reiter, MD [facility] None  11/24/2024  1:30 PM Orman Erminio POUR, PA-C [facility] None  01/19/2025  2:45 PM [facility] PHLEBOTOMIST [facility] None  01/19/2025  3:00 PM Evamarie Raetz, Chinita, MD [facility] None     This document was completed utilizing speech recognition software. Grammatical errors, random word insertions, pronoun errors, and incomplete sentences are an occasional consequence of this system due to software limitations, ambient noise, and hardware issues. Any formal questions or concerns about the content, text or information contained within the body of this dictation should be directly addressed to the provider for clarification.   "

## 2024-07-22 NOTE — Telephone Encounter (Signed)
 Submitted Patient Assistance Application to [facility] WITH ME for [facility] along with patient portion, provider portion, insurance card copy, prior authorization approval, and current medication list. Will update patient when we receive a response.  Phone #: [phone] Fax #: [phone]

## 2024-07-23 DIAGNOSIS — D72829 Elevated white blood cell count, unspecified: Secondary | ICD-10-CM | POA: Diagnosis not present

## 2024-07-23 LAB — SURGICAL PATHOLOGY

## 2024-07-24 ENCOUNTER — Ambulatory Visit (HOSPITAL_COMMUNITY): Admitting: Licensed Clinical Social Worker

## 2024-07-24 ENCOUNTER — Encounter (HOSPITAL_COMMUNITY): Payer: Self-pay | Admitting: Licensed Clinical Social Worker

## 2024-07-24 DIAGNOSIS — F329 Major depressive disorder, single episode, unspecified: Secondary | ICD-10-CM

## 2024-07-24 DIAGNOSIS — F411 Generalized anxiety disorder: Secondary | ICD-10-CM

## 2024-07-24 DIAGNOSIS — F4310 Post-traumatic stress disorder, unspecified: Secondary | ICD-10-CM

## 2024-07-24 DIAGNOSIS — F332 Major depressive disorder, recurrent severe without psychotic features: Secondary | ICD-10-CM
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
## 2024-07-25 LAB — BCR-ABL1 FISH
Cells Analyzed: 200
Cells Counted: 200

## 2024-07-25 NOTE — Telephone Encounter (Signed)
 Pt seen in office and spoke to provider.

## 2024-07-26 ENCOUNTER — Other Ambulatory Visit (HOSPITAL_BASED_OUTPATIENT_CLINIC_OR_DEPARTMENT_OTHER): Payer: Self-pay

## 2024-07-28 ENCOUNTER — Telehealth: Payer: Self-pay

## 2024-07-28 NOTE — Telephone Encounter (Signed)
 Copied from CRM [id number]. 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

## 2024-07-29 LAB — FLOW CYTOMETRY

## 2024-08-01 ENCOUNTER — Telehealth: Payer: Self-pay | Admitting: Oncology

## 2024-08-01 NOTE — Telephone Encounter (Signed)
 Called Pt to reschedule appt due to weather, voicemail box is full.

## 2024-08-04 ENCOUNTER — Inpatient Hospital Stay: Admitting: Oncology

## 2024-08-04 ENCOUNTER — Ambulatory Visit: Admitting: Sports Medicine

## 2024-08-05 NOTE — Progress Notes (Unsigned)
 "  Office Visit Note  Patient: Theresa Sawyer             Date of Birth: January 22, 1981           MRN: [id number]             PCP: Kayla Jeoffrey RAMAN, FNP Referring: Kayla Jeoffrey RAMAN, FNP Visit Date: 08/19/2024 Occupation: Data Unavailable  Subjective:  No chief complaint on file.   History of Present Illness: Theresa Sawyer is a 44 y.o. female ***     Activities of Daily Living:  Patient reports morning stiffness for *** {minute/hour:19697}.   Patient {ACTIONS;DENIES/REPORTS:21021675::Denies} nocturnal pain.  Difficulty dressing/grooming: {ACTIONS;DENIES/REPORTS:21021675::Denies} Difficulty climbing stairs: {ACTIONS;DENIES/REPORTS:21021675::Denies} Difficulty getting out of chair: {ACTIONS;DENIES/REPORTS:21021675::Denies} Difficulty using hands for taps, buttons, cutlery, and/or writing: {ACTIONS;DENIES/REPORTS:21021675::Denies}  No Rheumatology ROS completed.   PMFS History:  Patient Active Problem List   Diagnosis Date Noted   PTSD (post-traumatic stress disorder) 07/24/2024   Polycythemia 07/22/2024   Intertrigo 07/15/2024   Chronic pain syndrome 05/07/2024   Long term (current) use of opiate analgesic 05/07/2024   Low back pain, unspecified 05/07/2024   Scoliosis of thoracolumbar spine 05/07/2024   Right knee pain 05/07/2024   Plantar fasciitis 05/07/2024   Postherpetic neuralgia 04/28/2024   Leukocytosis 04/28/2024   Left upper quadrant abdominal pain 04/28/2024   Erythrasma 04/22/2024   Gastroesophageal reflux disease 04/22/2024   Elevated liver enzymes 11/06/2023   Physical exam, annual 10/10/2023   Morbid obesity (HCC) 10/10/2023   Left flank pain 10/10/2023   Thyroid  nodule 10/10/2023   Gastroesophageal reflux disease with esophagitis without hemorrhage 10/10/2023   Aortic atherosclerosis 10/10/2023   Depression 10/05/2022   Tobacco dependence due to cigarettes 08/23/2022   Dyslipidemia 10/30/2020   Prediabetes 10/30/2020   Essential hypertension  08/04/2020   Vitamin D  deficiency 08/04/2020   Gastroesophageal reflux disease without esophagitis 08/04/2020   Migraine without aura and without status migrainosus, not intractable 08/04/2020   Primary osteoarthritis involving multiple joints 03/18/2020   Dysfunctional uterine bleeding 03/18/2020   Generalized anxiety disorder 03/18/2020   Psoriatic arthritis (HCC) 01/24/2020   Primary osteoarthritis of both knees 01/24/2020   History of hyperlipidemia 01/24/2020   History of iron deficiency anemia 01/24/2020   BMI 40.0-44.9, adult (HCC) 01/24/2020   OSA on CPAP 04/24/2019   Psoriasis 12/05/2018   Situational anxiety 12/05/2018    Past Medical History:  Diagnosis Date   Abnormal uterine bleeding (AUB)    Acute cholecystitis 10/24/2021   Anxiety    Arthritis    oa   Depression 5 years ago   GERD (gastroesophageal reflux disease)    History of chest pain (10-20-2019  pt denies any cardiac s&s)   mutliple ED visits, atypical chest pain, chest wall pain, muscularskeletal chest pain;  pt had cardiology evaulation dated 04-23-2018 note in epic , state atypical chest wall pain with negative d dimer and troponin's multiple times in epic, suggested CTA  (pt did not get done)   Hyperlipidemia    Hypertension    IDA (iron deficiency anemia)    none recently   Menorrhagia    Migraine    OSA on CPAP    followed by dr ather cpap set on 7 to 14   Osteoporosis    Psoriasis    Urinary frequency     Family History  Problem Relation Age of Onset   Anxiety disorder Mother    Kidney disease Mother    Alcohol abuse Father    Mental illness  Father    Psoriasis Sister    Other Sister        pre-diabetic   Arthritis Sister    Depression Sister    Heart attack Brother 1       deceased from this   Anxiety disorder Brother    Hypertension Brother    Diabetes Maternal Grandmother    Cirrhosis Maternal Grandfather        alcohol related   Diabetes Paternal Grandmother    Allergy  (severe) Son        & nosebleeds   Colon cancer Neg Hx    Esophageal cancer Neg Hx    Colon polyps Neg Hx    Stomach cancer Neg Hx    Pancreatic cancer Neg Hx    Past Surgical History:  Procedure Laterality Date   ABDOMINAL HYSTERECTOMY     CHOLECYSTECTOMY N/A 10/25/2021   Procedure: LAPAROSCOPIC CHOLECYSTECTOMY with INTRAOPERATIVE CHOLANGIOGRAM;  Surgeon: Sheldon Standing, MD;  Location: [facility];  Service: General;  Laterality: N/A;   DILATATION & CURETTAGE/HYSTEROSCOPY WITH MYOSURE N/A 10/24/2019   Procedure: DILATATION & CURETTAGE/HYSTEROSCOPY WITH MYOSURE;  Surgeon: Arnaldo Purchase, MD;  Location: [facility];  Service: Gynecology;  Laterality: N/A;   HYSTEROSCOPY WITH NOVASURE N/A 04/06/2020   Procedure: DILATION AND CURRETTAGE; NOVASURE ABLATION;  Surgeon: Arnaldo Purchase, MD;  Location: [facility];  Service: Gynecology;  Laterality: N/A;   LAPAROSCOPY N/A 04/06/2020   Procedure: LAPAROSCOPY OPERATIVE; LYSIS OF ADHESION;  Surgeon: Arnaldo Purchase, MD;  Location: [facility];  Service: Gynecology;  Laterality: N/A;   ROBOTIC ASSISTED TOTAL HYSTERECTOMY WITH BILATERAL SALPINGO OOPHERECTOMY Bilateral 09/28/2020   Procedure: XI ROBOTIC ASSISTED TOTAL LAPAROSCOPIC HYSTERECTOMY WITH BILATERAL SALPINGO OOPHORECTOMY;  Surgeon: Lavoie, Marie-Lyne, MD;  Location: [facility];  Service: Gynecology;  Laterality: Bilateral;  request 8:30am OR start time in IQUEUE held time for Dr. Lavoie requests 2 hours KATHY CONFIRMED ON 2/10 W/NICK FOR  TRACY (RNFA) TO ASSIST   TUBAL LIGATION Bilateral 02/05/2013   Procedure: POST PARTUM TUBAL LIGATION;  Surgeon: Lynwood KANDICE Solomons, MD;  Location: [facility];  Service: Gynecology;  Laterality: Bilateral;    Filshie clips   Social History[1] Social History   Social History Narrative   Works at [employer] in [employer] History  Administered Date(s) Administered   Rho (D) Immune Globulin   07/10/2012, 12/30/2012, 02/05/2013   Tdap 02/06/2013     Objective: Vital Signs: LMP 09/01/2020    Physical Exam   Musculoskeletal Exam: ***  CDAI Exam: CDAI Score: -- Patient Global: --; Provider Global: -- Swollen: --; Tender: -- Joint Exam 08/19/2024   No joint exam has been documented for this visit   There is currently no information documented on the homunculus. Go to the Rheumatology activity and complete the homunculus joint exam.  Investigation: No additional findings.  Imaging: CT ABDOMEN PELVIS W CONTRAST Result Date: 07/14/2024 CLINICAL DATA:  Left lower quadrant abdominal pain EXAM: CT ABDOMEN AND PELVIS WITH CONTRAST TECHNIQUE: Multidetector CT imaging of the abdomen and pelvis was performed using the standard protocol following bolus administration of intravenous contrast. RADIATION DOSE REDUCTION: This exam was performed according to the departmental dose-optimization program which includes automated exposure control, adjustment of the mA and/or kV according to patient size and/or use of iterative reconstruction technique. CONTRAST:  OMNIPAQUE  IOHEXOL  300 MG/ML  SOLN COMPARISON:  10/01/2023 FINDINGS: Lower chest: No acute abnormality. Hepatobiliary: Interval improvement in the degree of hepatic steatosis compared  to 10/01/2023. Focal fatty infiltration noted along the falciform ligament anteriorly. No large focal hepatic abnormality or biliary dilatation pattern. Hepatic and portal veins are patent. Remote cholecystectomy. Common bile duct nondilated. Pancreas: Unremarkable. No pancreatic ductal dilatation or surrounding inflammatory changes. Spleen: Normal in size without focal abnormality. Adjacent accessory splenule again noted. Adrenals/Urinary Tract: Adrenal glands are unremarkable. Kidneys are normal, without renal calculi, focal lesion, or hydronephrosis. Bladder is unremarkable. Stomach/Bowel: Stomach is within normal limits. Appendix appears normal. No evidence  of bowel wall thickening, distention, or inflammatory changes. Vascular/Lymphatic: Minimal aortic bifurcation atherosclerosis. No acute aortic process, aneurysm or dissection. Mesenteric and renal vasculature all patent. No veno-occlusive process. No bulky adenopathy. Reproductive: Status post hysterectomy. No adnexal masses. Other: No abdominal wall hernia or abnormality. No abdominopelvic ascites. Musculoskeletal: No acute or significant osseous findings. IMPRESSION: 1. No acute intra-abdominal or pelvic finding by CT. 2. Interval improvement in the degree of hepatic steatosis. 3. Remote cholecystectomy and hysterectomy. Electronically Signed   By: CHRISTELLA.  Shick M.D.   On: 07/14/2024 12:27   DG Chest 2 View Result Date: 07/14/2024 CLINICAL DATA:  Chest pain radiating to back. EXAM: CHEST - 2 VIEW COMPARISON:  04/21/2024 FINDINGS: The heart size and mediastinal contours are within normal limits. Both lungs are clear. The visualized skeletal structures are unremarkable. IMPRESSION: No active cardiopulmonary disease. Electronically Signed   By: Norleen DELENA Kil M.D.   On: 07/14/2024 09:51    Recent Labs: Lab Results  Component Value Date   WBC 12.8 (H) 07/22/2024   HGB 15.4 (H) 07/22/2024   PLT 366 07/22/2024   NA 138 07/22/2024   K 3.8 07/22/2024   CL 100 07/22/2024   CO2 27 07/22/2024   GLUCOSE 88 07/22/2024   BUN 7 07/22/2024   CREATININE 0.84 07/22/2024   BILITOT 0.4 07/22/2024   ALKPHOS 117 07/22/2024   AST 26 07/22/2024   ALT 32 07/22/2024   PROT 7.7 07/22/2024   ALBUMIN 4.7 07/22/2024   CALCIUM  10.2 07/22/2024   GFRAA 122 07/28/2020   QFTBGOLDPLUS NEGATIVE 04/22/2024   April 22, 2024 TB Gold negative, hemoglobin A1c 5.4, sed rate 2, RF negative, anti-CCP negative, hepatitis B nonreactive, TSH normal  Speciality Comments: MTX- fatigue Otezla -inadequate response Cosentyx-denied by insurance  Procedures:  No procedures performed Allergies: Ibuprofen  and Tylenol  [acetaminophen ]    Assessment / Plan:     Visit Diagnoses: Psoriatic arthritis (HCC)  Psoriasis  High risk medication use  Leukocytosis, unspecified type  Elevated liver enzymes  Pain in both hands  Chronic pain of both knees  Primary osteoarthritis of both knees  Pain in both feet  Chronic SI joint pain  Candidiasis  Nail dystrophy  Alternating constipation and diarrhea  Essential hypertension  Dyslipidemia  Aortic atherosclerosis  Prediabetes  Gastroesophageal reflux disease with esophagitis without hemorrhage  Migraine without aura and without status migrainosus, not intractable  Vitamin D  deficiency  Thyroid  nodule  Anxiety and depression  OSA on CPAP  Smoker  Family history of psoriasis in sister and first cousin  Orders: No orders of the defined types were placed in this encounter.  No orders of the defined types were placed in this encounter.   Face-to-face time spent with patient was *** minutes. Greater than 50% of time was spent in counseling and coordination of care.  Follow-Up Instructions: No follow-ups on file.   Maya Nash, MD  Note - This record has been created using Animal nutritionist.  Chart creation errors have been sought, but may not always  have been located. Such creation errors do not reflect on  the standard of medical care.    [1]  Social History Tobacco Use   Smoking status: Every Day    Current packs/day: 0.50    Average packs/day: 0.5 packs/day for 40.0 years (20.0 ttl pk-yrs)    Types: Cigarettes    Start date: 07/24/2004    Passive exposure: Never   Smokeless tobacco: Never  Vaping Use   Vaping status: Never Used  Substance Use Topics   Alcohol use: No   Drug use: Never   "

## 2024-08-07 ENCOUNTER — Ambulatory Visit (HOSPITAL_COMMUNITY): Admitting: Licensed Clinical Social Worker

## 2024-08-07 ENCOUNTER — Ambulatory Visit: Payer: Self-pay

## 2024-08-07 DIAGNOSIS — F411 Generalized anxiety disorder: Secondary | ICD-10-CM

## 2024-08-07 DIAGNOSIS — F4310 Post-traumatic stress disorder, unspecified: Secondary | ICD-10-CM

## 2024-08-07 NOTE — Telephone Encounter (Signed)
 FYI Only or Action Required?: Action required by provider: request for appointment, clinical question for provider, and update on patient condition.  Patient was last seen in primary care on 07/15/2024 by Kayla Jeoffrey RAMAN, FNP.  Called Nurse Triage reporting Abdominal Pain.  Symptoms began several days ago.  Interventions attempted: Rest, hydration, or home remedies, Ice/heat application, and Other: gastro appt upcoming pt thinks will likely be canceled, previous PCP appts, ED in past.  Symptoms are: rapidly worsening.  Triage Disposition: Call EMS 911 Now  Patient/caregiver understands and will follow disposition?: No, refuses disposition       Reason for Disposition  [1] Chest pain lasts > 5 minutes AND [2] age > 83  Answer Assessment - Initial Assessment Questions This RN recommended pt be examined in hospital, pt refusing, requesting appt with PCP since with ED all they tell me to do is follow up with PCP, been to ED 3x for this. Advised pt go to hospital, especially call 911 or get to hospital asap if any new or worsening symptoms. Sending message to PCP office for call back to pt with further recommendations. Alerted [facility] to ED refusal.      1. LOCATION: Where does it hurt?       Pain on left side below rib cage, lump there, nodule on spleen, not sure if it's that  2. RADIATION: Does the pain go anywhere else? (e.g., into neck, jaw, arms, back)     Shoots up in to left shoulder sometimes, into chest for longer than 5 min at a time, into lower back  6. SEVERITY: How bad is the pain?  (e.g., Scale 1-10; mild, moderate, or severe)     Worsening 8/10 pain Starting to get unbearable Can't even sleep on that side, feels like golf ball sitting there  7. CARDIAC RISK FACTORS: Do you have any history of heart problems or risk factors for heart disease? (e.g., angina, prior heart attack; diabetes, high blood pressure, high cholesterol, smoker, or strong family history  of heart disease)     Significant  8. PULMONARY RISK FACTORS: Do you have any history of lung disease?  (e.g., blood clots in lung, asthma, emphysema, birth control pills)     OSA  9. CAUSE: What do you think is causing the chest pain?     Think it's my spleen Also have osteoporosis and GERD  10. OTHER SYMPTOMS: Do you have any other symptoms? (e.g., dizziness, nausea, vomiting, sweating, fever, difficulty breathing, cough)       Low appetite causing weight loss - 30 lbs in 1 year, 6-7 months at least with lump Fatigue Numbness/tingling in feet and hands getting worse Felt like might pass out from pain couple days ago Feels swollen sometimes at area of abdomen  Denies: Too weak to stand SOB Confusion/slurred speech Passed out  Protocols used: Chest Pain-A-AH

## 2024-08-07 NOTE — Telephone Encounter (Signed)
 Spoke to pt. She stated  I have been to the ED twice and they just tell me to follow up w/ my PCP  Pt. Also states  I have an app w/ Gastro on Monday that has been rescheduled twice due to weather, But I am going to [facility] Monday b/c Jeoffrey has referred me . She is aware of the lump.  Pt. Stated  her pain level is at a 8.5. she is able to move bowels, and make urine. She is staying hydrated, she does not have an appetite due to the fact it hurts when she eats the lump swells. She stated that the numbness and tingling is in her hands and feet. She is icing the lump now . She has been advised to visit the ED for eval. and she stated that if the pain gets any worse she will go to be evaluated.

## 2024-08-07 NOTE — Progress Notes (Signed)
 "  THERAPIST PROGRESS NOTE  Virtual Visit via Video Note  I connected with Theresa Sawyer on 08/07/24 at  4:00 PM EST by a video enabled telemedicine application and verified that I am speaking with the correct person using two identifiers.  Location: Patient: Pt home  Provider: Providers Home    I discussed the limitations of evaluation and management by telemedicine and the availability of in person appointments. The patient expressed understanding and agreed to proceed.     I discussed the assessment and treatment plan with the patient. The patient was provided an opportunity to ask questions and all were answered. The patient agreed with the plan and demonstrated an understanding of the instructions.   The patient was advised to call back or seek an in-person evaluation if the symptoms worsen or if the condition fails to improve as anticipated.  I provided 25 minutes of non-face-to-face time during this encounter.   Juliene GORMAN Patee, LCSW   Participation Level: Minimal  Behavioral Response: CasualAlertAnxious and Depressed  Type of Therapy: Individual Therapy  Treatment Goals addressed:  Active     Anxiety     LTG: Theresa Sawyer will score less than 5 on the Generalized Anxiety Disorder 7 Scale (GAD-7)      Start:  08/07/24    Expected End:  02/06/25         STG: Theresa Sawyer will practice problem solving skills 3 times per week for the next 4 weeks.      Start:  08/07/24    Expected End:  02/06/25         STG: Theresa Sawyer will reduce frequency of avoidant behaviors by 50% as evidenced by self-report in therapy sessions     Start:  08/07/24    Expected End:  02/06/25         Discuss risks and benefits of medication treatment options for this problem and prescribe as indicated     Start:  08/07/24         Encourage Theresa Sawyer to take psychotropic medication(s) as prescribed     Start:  08/07/24         Review results of GAD-7 with Theresa Sawyer to track progress     Start:  08/07/24             ProgressTowards Goals: Initial  Interventions: CBT, Motivational Interviewing, and Supportive   Suicidal/Homicidal: Nowithout intent/plan  Therapist Response:      S - Subjective: Theresa Sawyer reports severe pain rated 8/10, related to a nodule in Theresa side, which has been causing frequent discomfort and limited Theresa ability to engage fully in session. Due to pain, she requested to shorten todays session to 25 minutes. She reports ongoing stressors related to finances, work, and caregiving, with no significant change since the last session. She expressed frustration regarding efforts to secure adequate care for Theresa Sawyer following a recent stroke but noted optimism as Theresa Sawyer will begin new insurance coverage on February 1, which is expected to improve access to in-home services. Patient endorses feelings of tension, worry, restlessness, feeling overwhelmed, worthlessness, and hopelessness. She reports attempting to cope by taking things day by day but feels increasingly overwhelmed balancing caregiving responsibilities and Theresa own personal and family needs. Patient denies suicidal or homicidal ideation.  O - Objective: Patient was alert and oriented 4, pleasant, cooperative, and maintained good eye contact. Engagement was limited due to visible pain; facial expressions indicated significant discomfort while speaking. Despite pain, patient engaged appropriately when able.  A -  Assessment: Patient continues to experience significant emotional distress related to chronic stressors and caregiving burden, compounded by acute physical pain. Mood appears affected by pain and ongoing psychosocial stress. No current safety concerns noted.  P - Plan:  Continue outpatient psychotherapy; follow-up scheduled in 2 weeks on February 12 at 4:00 PM (virtual).  LCSW utilized psychodynamic therapy to support expression of thoughts, feelings, and concerns within a nonjudgmental environment.  LCSW  reviewed previously provided [facility] resources; patient plans to review once new insurance coverage becomes active.  Crisis planning reviewed; patient denied SI/HI and demonstrated understanding of emergency resources.  Plan: Return again in 2 weeks.  Diagnosis: PTSD (post-traumatic stress disorder)  Generalized anxiety disorder  Collaboration of Care: Other None today   Patient/Guardian was advised Release of Information must be obtained prior to any record release in order to collaborate their care with an outside provider. Patient/Guardian was advised if they have not already done so to contact the registration department to sign all necessary forms in order for us  to release information regarding their care.   Consent: Patient/Guardian gives verbal consent for treatment and assignment of benefits for services provided during this visit. Patient/Guardian expressed understanding and agreed to proceed.   Juliene GORMAN Patee, LCSW 08/07/2024  "

## 2024-08-11 ENCOUNTER — Ambulatory Visit: Admitting: Gastroenterology

## 2024-08-12 ENCOUNTER — Ambulatory Visit: Admitting: Family Medicine

## 2024-08-12 ENCOUNTER — Inpatient Hospital Stay: Attending: Oncology | Admitting: Oncology

## 2024-08-12 ENCOUNTER — Other Ambulatory Visit (HOSPITAL_BASED_OUTPATIENT_CLINIC_OR_DEPARTMENT_OTHER): Payer: Self-pay

## 2024-08-12 ENCOUNTER — Other Ambulatory Visit (HOSPITAL_COMMUNITY): Payer: Self-pay

## 2024-08-12 ENCOUNTER — Encounter: Payer: Self-pay | Admitting: Family Medicine

## 2024-08-12 ENCOUNTER — Encounter: Payer: Self-pay | Admitting: Oncology

## 2024-08-12 VITALS — BP 130/88 | HR 85 | Temp 97.9°F | Ht 66.0 in | Wt 214.6 lb

## 2024-08-12 DIAGNOSIS — Z79891 Long term (current) use of opiate analgesic: Secondary | ICD-10-CM

## 2024-08-12 DIAGNOSIS — D72829 Elevated white blood cell count, unspecified: Secondary | ICD-10-CM

## 2024-08-12 DIAGNOSIS — G894 Chronic pain syndrome: Secondary | ICD-10-CM | POA: Diagnosis not present

## 2024-08-12 DIAGNOSIS — D751 Secondary polycythemia: Secondary | ICD-10-CM

## 2024-08-12 DIAGNOSIS — M170 Bilateral primary osteoarthritis of knee: Secondary | ICD-10-CM | POA: Diagnosis not present

## 2024-08-12 DIAGNOSIS — G4733 Obstructive sleep apnea (adult) (pediatric): Secondary | ICD-10-CM

## 2024-08-12 DIAGNOSIS — B0229 Other postherpetic nervous system involvement: Secondary | ICD-10-CM

## 2024-08-12 MED ORDER — OXYCODONE-ACETAMINOPHEN 5-325 MG PO TABS
1.0000 | ORAL_TABLET | Freq: Three times a day (TID) | ORAL | 0 refills | Status: AC | PRN
  Filled 2024-08-12: qty 90, 30d supply, fill #0

## 2024-08-12 NOTE — Assessment & Plan Note (Signed)
 Chronic, mild polycythemia, likely secondary to cigarette smoking.  Picture not indicative of primary polycythemia.  We will defer JAK2 testing at this time.

## 2024-08-12 NOTE — Telephone Encounter (Signed)
 Received call from [employer] rep stating that they are still waiting for PA info. Explained pt's situation to rep, sent in faxed screenshot of test claim. Of note, pt's insurance now applies an eVoucher that pays $2100, however pt is still left with $12,024.42.

## 2024-08-13 ENCOUNTER — Telehealth (HOSPITAL_COMMUNITY): Admitting: Registered Nurse

## 2024-08-13 ENCOUNTER — Other Ambulatory Visit (HOSPITAL_BASED_OUTPATIENT_CLINIC_OR_DEPARTMENT_OTHER): Payer: Self-pay

## 2024-08-13 ENCOUNTER — Encounter (HOSPITAL_COMMUNITY): Payer: Self-pay | Admitting: Registered Nurse

## 2024-08-13 DIAGNOSIS — F332 Major depressive disorder, recurrent severe without psychotic features: Secondary | ICD-10-CM

## 2024-08-13 DIAGNOSIS — G4700 Insomnia, unspecified: Secondary | ICD-10-CM

## 2024-08-13 DIAGNOSIS — F411 Generalized anxiety disorder: Secondary | ICD-10-CM

## 2024-08-13 MED ORDER — DULOXETINE HCL 60 MG PO CPEP
60.0000 mg | ORAL_CAPSULE | Freq: Two times a day (BID) | ORAL | 1 refills | Status: AC
  Filled 2024-08-13: qty 60, 30d supply, fill #0

## 2024-08-13 MED ORDER — AMITRIPTYLINE HCL 25 MG PO TABS
25.0000 mg | ORAL_TABLET | Freq: Every day | ORAL | 1 refills | Status: AC
  Filled 2024-08-13: qty 30, 30d supply, fill #0

## 2024-08-13 MED ORDER — BUSPIRONE HCL 10 MG PO TABS
5.0000 mg | ORAL_TABLET | Freq: Three times a day (TID) | ORAL | 1 refills | Status: AC
  Filled 2024-08-13: qty 45, 30d supply, fill #0

## 2024-08-13 MED ORDER — HYDROXYZINE PAMOATE 25 MG PO CAPS
25.0000 mg | ORAL_CAPSULE | Freq: Three times a day (TID) | ORAL | 1 refills | Status: AC | PRN
  Filled 2024-08-13: qty 90, 30d supply, fill #0

## 2024-08-13 MED ORDER — QUETIAPINE FUMARATE 50 MG PO TABS
50.0000 mg | ORAL_TABLET | Freq: Every day | ORAL | 1 refills | Status: AC
  Filled 2024-08-13: qty 30, 30d supply, fill #0

## 2024-08-13 NOTE — Progress Notes (Signed)
 [facility] MD/PA/NP OP Progress Note  08/13/2024 5:46 PM Theresa Sawyer  MRN:  [id number]  Virtual Visit via Video Note  I connected with Theresa Sawyer on 08/13/24 at  4:30 PM EST by a video enabled telemedicine application and verified that I am speaking with the correct person using two identifiers.  Location: Patient: Home Provider: Home office   I discussed the limitations of evaluation and management by telemedicine and the availability of in person appointments. The patient expressed understanding and agreed to proceed.  I discussed the assessment and treatment plan with the patient. The patient was provided an opportunity to ask questions and all were answered. The patient agreed with the plan and demonstrated an understanding of the instructions.   The patient was advised to call back or seek an in-person evaluation if the symptoms worsen or if the condition fails to improve as anticipated.  I provided 30 minutes of non-face-to-face time during this encounter.  I personally spent a total of 30 minutes in the care of the patient today including preparing to see the patient, getting/reviewing separately obtained history, performing a medically appropriate exam/evaluation, counseling and educating, placing orders, and documenting clinical information in the EHR; In addiction to conducting screenings PHQ-9, GAD-7, AIMS, Nutrition, and Pain, discussing medication options, medication education, discussing safety, and establishing safety plan.  Safety resources added to AVS  Luisa Ruder, NP    Chief Complaint:  Chief Complaint  Patient presents with   Follow-up    Medication management   HPI: Theresa Sawyer 44 y.o. female presents today for medication management follow up.  She was seen via virtual video visit by this provider and chart reviewed on 08/13/24.  Her psychiatric history is significant for major depression, general anxiety, and insomnia.  Her mental health is currently managed with  Cymbalta  30 mg daily, BuSpar  10 mg three times daily, and Amitriptyline  25 mg daily at bedtime.  She reports current medication regimen is not effectively managing her mental health.  However, she denies adverse reactions to current medications.  She reports there has been no improvement in depression, anxiety, mood, or sleep since her last visit.  She reports decreased appetite but her PCP is aware and has referred to GI.  Reporting trouble falling and staying asleep.  I can't because my mind is racing.  She also reports she continues to have passive suicidal thoughts I'm not going to do anything to kill myself.  Sometimes I just wish I would wake up and then I feel guilty for having thoughts like that when I have a son.  If it was not for him I am a little for him I probably would not want to die.  She contracts for safety stating there is no intent or plan and she would never do anything to leave her son without her mother.  Reports stressors continue to be the same I have a lot on my plate right now.  No further elaboration.  She denies active suicidal ideation, denies intent/plan, and she contracts for safety.  She also denies homicidal ideation, psychosis, paranoia, and abnormal movement.  Screenings completed during today's visit PHQ-9, C-SSRS, GAD-7, AIMS, Nutrition, and Pain, see scores below.  Medication/treatment options discussed: Medication options (Abilify, Seroquel , Lyrica ) reports has tried gabapentin in the past and was unable to take it related to it making her feel high.  She agrees to adjustments of Cymbalta .  Discussed tapering off of amitriptyline  at next visit if no improvement.  She  agrees to trial of Seroquel .  Educated on the side effect/efficacy profile of Seroquel , and educational information added to AVS. Also discussed GeneSight testing and TMS.  Educational materiel/information added to AVS.    Recommendations: Continue amitriptyline  25 mg daily at bedtime, Vistaril  25 mg 3  times daily as needed, BuSpar  10 mg 3 times daily, increase Cymbalta  60 mg twice daily, start Seroquel  50 mg daily at bedtime. She voiced understanding/agreement with information/recommendation discussed during today's visit   Visit Diagnosis:    ICD-10-CM   1. Severe episode of recurrent major depressive disorder, without psychotic features (HCC)  F33.2 busPIRone  (BUSPAR ) 10 MG tablet    DULoxetine  (CYMBALTA ) 60 MG capsule    QUEtiapine  (SEROQUEL ) 50 MG tablet    2. Insomnia, unspecified type  G47.00 hydrOXYzine  (VISTARIL ) 25 MG capsule    amitriptyline  (ELAVIL ) 25 MG tablet    QUEtiapine  (SEROQUEL ) 50 MG tablet    3. GAD (generalized anxiety disorder)  F41.1 hydrOXYzine  (VISTARIL ) 25 MG capsule    busPIRone  (BUSPAR ) 10 MG tablet    DULoxetine  (CYMBALTA ) 60 MG capsule    QUEtiapine  (SEROQUEL ) 50 MG tablet      Past Psychiatric History:  Diagnosis:  Major depression, general anxiety, insomnia Suicide attempt:  Denies prior history of suicide attempt Non-suicidal self-injurious behavior:  Denies history of self injurious behavior Psychiatric hospitalization:  Denies prior psychiatric hospitalization Past trauma:  Denies history of abuse, and domestic violence but states traumatic experience during childhood related to My Dad was an alcoholic and was in and out of jail, and left us  when I was in the 5th grade.  At 57 or 64 (y/o) my Mom left and my sister and brother raised me until I went to live with my Grandma (maternal) for a little bit until I moved in with my aunt and uncle who were a little more stable. Violence:  Denies a history of violence Substance abuse:  Denies a history of illicit drug use including marijuana.  Denies alcohol use.  Reports she does smoke cigarettes 1/2 pK a day.  Past psychotropic medication trials:  Lexapro   Past Medical History:  Past Medical History:  Diagnosis Date   Abnormal uterine bleeding (AUB)    Acute cholecystitis 10/24/2021   Anxiety     Arthritis    oa   Depression 5 years ago   GERD (gastroesophageal reflux disease)    History of chest pain (10-20-2019  pt denies any cardiac s&s)   mutliple ED visits, atypical chest pain, chest wall pain, muscularskeletal chest pain;  pt had cardiology evaulation dated 04-23-2018 note in epic , state atypical chest wall pain with negative d dimer and troponin's multiple times in epic, suggested CTA  (pt did not get done)   Hyperlipidemia    Hypertension    IDA (iron deficiency anemia)    none recently   Menorrhagia    Migraine    OSA on CPAP    followed by dr ather cpap set on 7 to 14   Osteoporosis    Psoriasis    Urinary frequency     Past Surgical History:  Procedure Laterality Date   ABDOMINAL HYSTERECTOMY     CHOLECYSTECTOMY N/A 10/25/2021   Procedure: LAPAROSCOPIC CHOLECYSTECTOMY with INTRAOPERATIVE CHOLANGIOGRAM;  Surgeon: Sheldon Standing, MD;  Location: [facility];  Service: General;  Laterality: N/A;   DILATATION & CURETTAGE/HYSTEROSCOPY WITH MYOSURE N/A 10/24/2019   Procedure: DILATATION & CURETTAGE/HYSTEROSCOPY WITH MYOSURE;  Surgeon: Arnaldo Purchase, MD;  Location: [facility] [place];  Service: Gynecology;  Laterality: N/A;   HYSTEROSCOPY WITH NOVASURE N/A 04/06/2020   Procedure: DILATION AND CURRETTAGE; NOVASURE ABLATION;  Surgeon: Arnaldo Purchase, MD;  Location: [facility] [place];  Service: Gynecology;  Laterality: N/A;   LAPAROSCOPY N/A 04/06/2020   Procedure: LAPAROSCOPY OPERATIVE; LYSIS OF ADHESION;  Surgeon: Arnaldo Purchase, MD;  Location: [facility] [place];  Service: Gynecology;  Laterality: N/A;   ROBOTIC ASSISTED TOTAL HYSTERECTOMY WITH BILATERAL SALPINGO OOPHERECTOMY Bilateral 09/28/2020   Procedure: XI ROBOTIC ASSISTED TOTAL LAPAROSCOPIC HYSTERECTOMY WITH BILATERAL SALPINGO OOPHORECTOMY;  Surgeon: Lavoie, Marie-Lyne, MD;  Location: [facility] [place];  Service: Gynecology;  Laterality: Bilateral;  request 8:30am OR start time in  IQUEUE held time for Dr. Lavoie requests 2 hours KATHY CONFIRMED ON 2/10 W/NICK FOR  TRACY (RNFA) TO ASSIST   TUBAL LIGATION Bilateral 02/05/2013   Procedure: POST PARTUM TUBAL LIGATION;  Surgeon: Lynwood KANDICE Solomons, MD;  Location: [facility];  Service: Gynecology;  Laterality: Bilateral;    Filshie clips    Family Psychiatric History: See below and family history  Family History:  Family History  Problem Relation Age of Onset   Anxiety disorder Mother    Kidney disease Mother    Alcohol abuse Father    Mental illness Father    Psoriasis Sister    Other Sister        pre-diabetic   Arthritis Sister    Depression Sister    Heart attack Brother 56       deceased from this   Anxiety disorder Brother    Hypertension Brother    Diabetes Maternal Grandmother    Cirrhosis Maternal Grandfather        alcohol related   Diabetes Paternal Grandmother    Allergy (severe) Son        & nosebleeds   Colon cancer Neg Hx    Esophageal cancer Neg Hx    Colon polyps Neg Hx    Stomach cancer Neg Hx    Pancreatic cancer Neg Hx     Social History:  Social History   Socioeconomic History   Marital status: Legally Separated    Spouse name: Not on file   Number of children: Not on file   Years of education: Not on file   Highest education level: Not on file  Occupational History   Not on file  Tobacco Use   Smoking status: Every Day    Current packs/day: 0.50    Average packs/day: 0.5 packs/day for 40.1 years (20.0 ttl pk-yrs)    Types: Cigarettes    Start date: 07/24/2004    Passive exposure: Never   Smokeless tobacco: Never  Vaping Use   Vaping status: Never Used  Substance and Sexual Activity   Alcohol use: No   Drug use: Never   Sexual activity: Yes    Birth control/protection: Surgical  Other Topics Concern   Not on file  Social History Narrative   Works at [employer] in [employer]   Social Drivers of Health   Tobacco Use: High Risk (08/13/2024)   Patient History    Smoking  Tobacco Use: Every Day    Smokeless Tobacco Use: Never    Passive Exposure: Never  Financial Resource Strain: Patient Declined (10/05/2022)   Overall Financial Resource Strain (CARDIA)    Difficulty of Paying Living Expenses: Patient declined  Food Insecurity: Food Insecurity Present (07/24/2024)   Epic    Worried About Radiation Protection Practitioner of Food in the Last Year: Never true  Ran Out of Food in the Last Year: Sometimes true  Transportation Needs: No Transportation Needs (07/24/2024)   Epic    Lack of Transportation (Medical): No    Lack of Transportation (Non-Medical): No  Recent Concern: Transportation Needs - Unmet Transportation Needs (07/21/2024)   Epic    Lack of Transportation (Medical): Yes    Lack of Transportation (Non-Medical): Yes  Physical Activity: Sufficiently Active (10/05/2022)   Exercise Vital Sign    Days of Exercise per Week: 7 days    Minutes of Exercise per Session: 150+ min  Stress: No Stress Concern Present (10/05/2022)   [employer] of Occupational Health - Occupational Stress Questionnaire    Feeling of Stress : Only a little  Social Connections: Moderately Isolated (07/24/2024)   Social Connection and Isolation Panel    Frequency of Communication with Friends and Family: Once a week    Frequency of Social Gatherings with Friends and Family: Once a week    Attends Religious Services: 1 to 4 times per year    Active Member of Clubs or Organizations: No    Attends Banker Meetings: Never    Marital Status: Living with partner  Depression (PHQ2-9): High Risk (08/13/2024)   Depression (PHQ2-9)    PHQ-2 Score: 24  Alcohol Screen: Low Risk (07/15/2024)   Alcohol Screen    Last Alcohol Screening Score (AUDIT): 0  Housing: Low Risk (07/24/2024)   Epic    Unable to Pay for Housing in the Last Year: No    Number of Times Moved in the Last Year: 0    Homeless in the Last Year: No  Utilities: At Risk (07/24/2024)   Epic    Threatened with loss of utilities:  Yes  Health Literacy: Inadequate Health Literacy (07/24/2024)   B1300 Health Literacy    Frequency of need for help with medical instructions: Sometimes    Allergies: Allergies[1]  Metabolic Disorder Labs: Lab Results  Component Value Date   HGBA1C 5.4 04/22/2024   MPG 108 04/22/2024   MPG 123 10/10/2023   No results found for: PROLACTIN Lab Results  Component Value Date   CHOL 124 10/10/2023   TRIG 291 (H) 10/10/2023   HDL 34 (L) 10/10/2023   CHOLHDL 3.6 10/10/2023   LDLCALC 56 10/10/2023   LDLCALC 112 (H) 08/23/2022   Lab Results  Component Value Date   TSH 1.450 07/22/2024   TSH 3.29 04/22/2024    Therapeutic Level Labs: No results found for: LITHIUM No results found for: VALPROATE No results found for: CBMZ  Current Medications: Current Outpatient Medications  Medication Sig Dispense Refill   QUEtiapine  (SEROQUEL ) 50 MG tablet Take 1 tablet (50 mg total) by mouth at bedtime. 30 tablet 1   amitriptyline  (ELAVIL ) 25 MG tablet Take 1 tablet (25 mg total) by mouth at bedtime. 30 tablet 1   busPIRone  (BUSPAR ) 10 MG tablet Take 0.5 tablets (5 mg total) by mouth 3 (three) times daily. 90 tablet 1   DULoxetine  (CYMBALTA ) 60 MG capsule Take 1 capsule (60 mg total) by mouth 2 (two) times daily. 60 capsule 1   famotidine  (PEPCID ) 20 MG tablet Take 1 tablet (20 mg total) by mouth 2 (two) times daily. 30 tablet 0   hydrOXYzine  (VISTARIL ) 25 MG capsule Take 1 capsule (25 mg total) by mouth 3 (three) times daily as needed. 90 capsule 1   ketoconazole  (NIZORAL ) 2 % cream Apply 1 Application topically 2 (two) times daily under both breasts 60 g 3  lidocaine  (LIDODERM ) 5 % Place 1 patch onto the skin daily. Remove & Discard patch within 12 hours or as directed by MD 30 patch 0   metFORMIN  (GLUCOPHAGE -XR) 500 MG 24 hr tablet Take 1 tablet (500 mg total) by mouth 2 (two) times daily with a meal. 180 tablet 3   oxyCODONE -acetaminophen  (PERCOCET/ROXICET) 5-325 MG tablet Take 1  tablet by mouth every 8 (eight) hours as needed for severe pain (pain score 7-10). 90 tablet 0   pantoprazole  (PROTONIX ) 20 MG tablet Take 1 tablet (20 mg total) by mouth daily. 30 tablet 0   valsartan  (DIOVAN ) 80 MG tablet Take 2 tablets (160 mg total) by mouth daily. 180 tablet 1   No current facility-administered medications for this visit.     Musculoskeletal: Strength & Muscle Tone: Unable to assess via virtual visit Gait & Station: Unable to assess via virtual visit Patient leans: N/A  Psychiatric Specialty Exam: Review of Systems  Constitutional:        No other complaints voiced at this time  Psychiatric/Behavioral:  Positive for agitation (Irritability), dysphoric mood and sleep disturbance. Negative for hallucinations and self-injury. Suicidal ideas: Endorsing passive suicidal ideation with no intent or plan, contracted for safety, safety plan established.The patient is nervous/anxious.   All other systems reviewed and are negative.   Last menstrual period 09/01/2020.There is no height or weight on file to calculate BMI.  General Appearance: Casual  Eye Contact:  Good  Speech:  Clear and Coherent and Normal Rate  Volume:  Normal  Mood:  Anxious and Depressed  Affect:  Congruent  Thought Process:  Coherent, Goal Directed, and Descriptions of Associations: Intact  Orientation:  Full (Time, Place, and Person)  Thought Content: Logical   Suicidal Thoughts:  Continues to endorse passive suicidal ideation without intent or plan, contracts for safety, safety plan established  Homicidal Thoughts:  No  Memory:  Immediate;   Good Recent;   Good Remote;   Good  Judgement:  Intact  Insight:  Present  Psychomotor Activity:  Normal  Concentration:  Concentration: Good and Attention Span: Good  Recall:  Good  Fund of Knowledge: Good  Language: Good  Akathisia:  No  Handed:  Right  AIMS (if indicated): done  Assets:  Communication Skills Desire for Improvement Financial  Resources/Insurance Housing Physical Health Resilience Social Support Talents/Skills Transportation  ADL's:  Intact  Cognition: WNL  Sleep:  Fair   Screenings: AIMS    Flowsheet Row Video Visit from 08/13/2024 in [facility] Office Visit from 07/15/2024 in [facility] at [facility]  AIMS Total Score 0 0   GAD-7    Flowsheet Row Video Visit from 08/13/2024 in [facility] at [facility] Counselor from 07/24/2024 in [facility] at [facility] Office Visit from 07/15/2024 in [facility] Office Visit from 06/24/2024 in [facility] Office Visit from 05/05/2024 in [facility]  Total GAD-7 Score 19 20 20 21 20    PHQ2-9    Flowsheet Row Video Visit from 08/13/2024 in [facility] at [facility] Counselor from 07/24/2024 in [facility] at [facility] Office Visit from 07/22/2024 in [facility] [place] Office Visit from 07/15/2024 in [facility] Office Visit from 06/24/2024 in [facility]  PHQ-2 Total Score 6 6 6 6 6   PHQ-9 Total Score 24 22 22 24 27    Flowsheet Row Video Visit from 08/13/2024 in [facility] at [facility] Counselor from 07/24/2024 in [facility] at [facility] Office Visit from 07/22/2024 in [facility] [place]  C-SSRS RISK CATEGORY Low Risk Low Risk Error: Question 6 not populated   Assessment and Plan:  Assessment:   Theresa Sawyer reported no adverse reaction to current medications.  However, she reports current medication  regimen is not effectively managing her mental health.  She reported there has been no improvement in her depression, anxiety, mood, or sleep since her last visit.  Reports decrease in appetite but related to GI issues.  PCP aware and has referred to GI.  Continues to have issues with falling asleep related to racing thoughts.  She endorsed passive suicidal ideation with no intent or plan, contracted for safety, and safety plan established.  She denied homicidal ideation, psychosis, paranoia, and abnormal movement.  Medication assessment completed and adjustments made. She was dressed appropriately for age and current weather.  She was seated comfortably in view of camera with no noted distress.  She was alert, oriented x 4, calm, cooperative, and attentive.  Her mood was congruent with affect.  She had normal speech and behavior.  Objectively there was no evidence of psychosis, mania, or delusional thinking.  She  was able to converse coherently and responded appropriately with goal directed thoughts, no distractibility, or pre-occupation.  1. Insomnia, unspecified type - hydrOXYzine  (VISTARIL ) 25 MG capsule; Take 1 capsule (25 mg total) by mouth 3 (three) times daily as needed.  Dispense: 90 capsule; Refill: 1 - amitriptyline  (ELAVIL ) 25 MG tablet; Take 1 tablet (25 mg total) by mouth at bedtime.  Dispense: 30 tablet; Refill: 1 - QUEtiapine  (SEROQUEL ) 50 MG tablet; Take 1 tablet (50 mg total) by mouth at bedtime.  Dispense: 30 tablet; Refill: 1  2. GAD (generalized anxiety disorder) - hydrOXYzine  (VISTARIL ) 25 MG capsule; Take 1 capsule (25 mg total) by mouth 3 (three) times daily as needed.  Dispense: 90 capsule; Refill: 1 - busPIRone  (BUSPAR ) 10 MG tablet; Take 0.5 tablets (5 mg total) by mouth 3 (three) times daily.  Dispense: 90 tablet; Refill: 1 - DULoxetine  (CYMBALTA ) 60 MG capsule; Take 1 capsule (60 mg total) by mouth 2 (two) times daily.  Dispense: 60 capsule; Refill: 1 - QUEtiapine  (SEROQUEL )  50 MG tablet; Take 1 tablet (50 mg total) by mouth at bedtime.  Dispense: 30 tablet; Refill: 1  3. Severe episode of recurrent major depressive disorder, without psychotic features (HCC) (Primary) - busPIRone  (BUSPAR ) 10 MG tablet; Take 0.5 tablets (5 mg total) by mouth 3 (three) times daily.  Dispense: 90 tablet; Refill: 1 - DULoxetine  (CYMBALTA ) 60 MG capsule; Take 1 capsule (60 mg total) by mouth 2 (two) times daily.  Dispense: 60 capsule; Refill: 1 - QUEtiapine  (SEROQUEL ) 50 MG tablet; Take 1 tablet (50 mg total) by mouth at bedtime.  Dispense: 30 tablet; Refill: 1   Plan:   Medication management: Meds ordered this encounter  Medications   hydrOXYzine  (VISTARIL ) 25 MG capsule    Sig: Take 1 capsule (25 mg total) by mouth 3 (three) times daily as needed.    Dispense:  90 capsule    Refill:  1    Supervising Provider:   CURRY LENI DASEN [  [id number]]   busPIRone  (BUSPAR ) 10 MG tablet    Sig: Take 0.5 tablets (5 mg total) by mouth 3 (three) times daily.    Dispense:  90 tablet    Refill:  1    Supervising Provider:   ARFEEN, SYED T [[id number]]   DULoxetine  (CYMBALTA ) 60 MG capsule    Sig: Take 1 capsule (60 mg total) by mouth 2 (two) times daily.    Dispense:  60 capsule    Refill:  1    Supervising Provider:   CURRY, SYED T [[id number]]   amitriptyline  (ELAVIL ) 25 MG tablet    Sig: Take 1 tablet (25 mg total) by mouth at bedtime.    Dispense:  30 tablet    Refill:  1    Supervising Provider:   CURRY, SYED T [[id number]]   QUEtiapine  (SEROQUEL ) 50 MG tablet    Sig: Take 1 tablet (50 mg total) by mouth at bedtime.    Dispense:  30 tablet    Refill:  1    Supervising Provider:   CURRY, SYED T [[id number]]   No orders of the defined types were placed in this encounter.   Labs:  Most recent labs reviewed.  Lab orders not indicated at this time.    Counseling/Therapy: Continue services with Adam Goldammer, LCSW.   Safety: Safety plan established and information attached to AVS.  Safety Plan Theresa Sawyer will reach out to her sister-in-law Lundy Tapp, call [phone], mobile crisis, or present to the nearest emergency room should she experiences any suicidal/homicidal ideation, auditory/visual/hallucinations, or detrimental worsening of her mental health.  She will follow up with [facility] [place] with Luisa Ruder, NP for psychotropic medication management and Adam Goldammer, LCSW for counseling/therapy services. The suicide prevention education provided includes the following: Suicide risk factors Suicide prevention and interventions National Suicide Hotline telephone number [facility] assessment telephone number [facility] Emergency Assistance 911 [facility] and/or Residential Mobile Crisis Unit telephone number Request made of family/significant other to:  She is asked to share this safety plan with [place] and family.   Remove weapons (e.g., guns, rifles, knives), all items previously/currently identified as safety concern.   Remove drugs/medications (over the counter, prescriptions, illicit drugs), all items previously/currently identified as a safety concern.   Theresa Sawyer participated in the development of this treatment plan and verbalized her understanding/agreement.    Follow Up: Return in 1 month for medication management.   Call in the interim for any side-effects, decompensation, questions, or problems   Collaboration of Care: Collaboration of Care: Medication Management AEB medication assessment, adjustment, refills, started Seroquel  and Other safety plan established and safety resources added to AVS  Patient/Guardian was advised Release of Information must be obtained prior to any record release in order to collaborate their care with an outside provider. Patient/Guardian was advised if they have not already done so to contact the registration department to sign all necessary forms in order for us  to release  information regarding their care.   Consent: Patient/Guardian gives verbal consent for treatment and assignment of benefits for services provided during this visit. Patient/Guardian expressed understanding and agreed to proceed.    Jackelyn Illingworth, NP 08/13/2024, 5:46 PM     [1]  Allergies Allergen Reactions   Ibuprofen      Elevated LFTs and upset stomach    Tylenol  [Acetaminophen ]     Elevated LFTs and upset stomach

## 2024-08-13 NOTE — Patient Instructions (Addendum)
 If no one has contacted, you by the end of business day today please call the appropriate office listed below to schedule your next visit for medication management with Luisa Ruder, NP:    [facility] at [facility] [address] [place], [place], [place], [place] [address]  Phone: [phone] (Call to schedule appointment)  [facility] at [facility] [address], [place], [place] [address] Phone: [phone] (Call to schedule appointment)  Safety Plan KINGSTON GUILES will reach out to her sister-in-law Lundy Deem, call 911, [phone], mobile crisis, or present to the nearest emergency room should she experiences any suicidal/homicidal ideation, auditory/visual/hallucinations, or detrimental worsening of her mental health.  She will follow up with [facility], [place] with Luisa Ruder, NP for psychotropic medication management and Adam Goldammer, LCSW for counseling/therapy services. The suicide prevention education provided includes the following: Suicide risk factors Suicide prevention and interventions National Suicide Hotline telephone number [facility] assessment telephone number [facility] Emergency Assistance 911 [facility] and/or Residential Mobile Crisis Unit telephone number Request made of family/significant other to:  She is asked to share this safety plan with [place] and family.   Remove weapons (e.g., guns, rifles, knives), all items previously/currently identified as safety concern.   Remove drugs/medications (over the counter, prescriptions, illicit drugs), all items previously/currently identified as a safety concern.     If you experience suicidal or homicidal thoughts, hallucinations, or a severe decline in your mental health, seek immediate help. You can: Call 911 Call [address] [facility] Suicide Prevention Lifeline) [phone]. This is a hotline for Spanish  speakers. Use mobile crisis services Go to the nearest emergency room Veterans can also: Call 988 and press 1 Text [phone] Mental health includes understanding and managing your emotions and behaviors in healthy ways. If you notice signs of emotional or mental distress, reach out to trusted supports--family, friends, healthcare providers, or mental health professionals. Healthy mental habits include stress-management skills, calming strategies, regular exercise, good sleep, healthy eating, and maintaining supportive relationships. This information does not replace advice from your healthcare provider--discuss any questions or concerns directly with them.  Mobile Crisis Response Teams Listed by counties in vicinity of [facility] providers [facility] [phone] [facility] [phone] [facility] [phone] [facility] [phone] [place]                * [facility] [phone]                * [facility] [phone]  [facility] [phone] [facility] [employer]  [phone] * [facility] [phone]   Call 988 (National Suicide Prevention Lifeline)

## 2024-08-14 ENCOUNTER — Ambulatory Visit: Admitting: Sports Medicine

## 2024-08-14 ENCOUNTER — Telehealth (HOSPITAL_COMMUNITY): Payer: Self-pay | Admitting: Registered Nurse

## 2024-08-14 NOTE — Telephone Encounter (Signed)
 Called to schedule 1 month follow up with provider form yesterday's visit. No answer. Voicemail box is full. Sending MyChart message.

## 2024-08-19 ENCOUNTER — Ambulatory Visit: Admitting: Rheumatology

## 2024-08-19 DIAGNOSIS — G43009 Migraine without aura, not intractable, without status migrainosus: Secondary | ICD-10-CM

## 2024-08-19 DIAGNOSIS — L4050 Arthropathic psoriasis, unspecified: Secondary | ICD-10-CM

## 2024-08-19 DIAGNOSIS — F17200 Nicotine dependence, unspecified, uncomplicated: Secondary | ICD-10-CM

## 2024-08-19 DIAGNOSIS — L603 Nail dystrophy: Secondary | ICD-10-CM

## 2024-08-19 DIAGNOSIS — Z840 Family history of diseases of the skin and subcutaneous tissue: Secondary | ICD-10-CM

## 2024-08-19 DIAGNOSIS — F32A Depression, unspecified: Secondary | ICD-10-CM

## 2024-08-19 DIAGNOSIS — R748 Abnormal levels of other serum enzymes: Secondary | ICD-10-CM

## 2024-08-19 DIAGNOSIS — Z79899 Other long term (current) drug therapy: Secondary | ICD-10-CM

## 2024-08-19 DIAGNOSIS — L409 Psoriasis, unspecified: Secondary | ICD-10-CM

## 2024-08-19 DIAGNOSIS — D72829 Elevated white blood cell count, unspecified: Secondary | ICD-10-CM

## 2024-08-19 DIAGNOSIS — G8929 Other chronic pain: Secondary | ICD-10-CM

## 2024-08-19 DIAGNOSIS — M170 Bilateral primary osteoarthritis of knee: Secondary | ICD-10-CM

## 2024-08-19 DIAGNOSIS — E785 Hyperlipidemia, unspecified: Secondary | ICD-10-CM

## 2024-08-19 DIAGNOSIS — G4733 Obstructive sleep apnea (adult) (pediatric): Secondary | ICD-10-CM

## 2024-08-19 DIAGNOSIS — I10 Essential (primary) hypertension: Secondary | ICD-10-CM

## 2024-08-19 DIAGNOSIS — R7303 Prediabetes: Secondary | ICD-10-CM

## 2024-08-19 DIAGNOSIS — M79641 Pain in right hand: Secondary | ICD-10-CM

## 2024-08-19 DIAGNOSIS — I700 Atherosclerosis of aorta: Secondary | ICD-10-CM

## 2024-08-19 DIAGNOSIS — B379 Candidiasis, unspecified: Secondary | ICD-10-CM

## 2024-08-19 DIAGNOSIS — R198 Other specified symptoms and signs involving the digestive system and abdomen: Secondary | ICD-10-CM

## 2024-08-19 DIAGNOSIS — E041 Nontoxic single thyroid nodule: Secondary | ICD-10-CM

## 2024-08-19 DIAGNOSIS — E559 Vitamin D deficiency, unspecified: Secondary | ICD-10-CM

## 2024-08-19 DIAGNOSIS — K2100 Gastro-esophageal reflux disease with esophagitis, without bleeding: Secondary | ICD-10-CM

## 2024-08-19 DIAGNOSIS — M79671 Pain in right foot: Secondary | ICD-10-CM

## 2024-08-21 ENCOUNTER — Ambulatory Visit (HOSPITAL_COMMUNITY): Admitting: Licensed Clinical Social Worker

## 2024-09-04 ENCOUNTER — Ambulatory Visit (HOSPITAL_COMMUNITY): Admitting: Licensed Clinical Social Worker

## 2024-09-16 ENCOUNTER — Encounter

## 2024-10-08 ENCOUNTER — Other Ambulatory Visit

## 2024-10-15 ENCOUNTER — Encounter: Admitting: Family Medicine

## 2024-11-24 ENCOUNTER — Ambulatory Visit: Admitting: Physician Assistant

## 2025-01-19 ENCOUNTER — Inpatient Hospital Stay

## 2025-01-19 ENCOUNTER — Inpatient Hospital Stay: Admitting: Oncology
